# Patient Record
Sex: Male | Born: 1980
Health system: Southern US, Community
[De-identification: ages and names within clinical notes are randomized; demographics above are authoritative.]

## PROBLEM LIST (undated history)

## (undated) DIAGNOSIS — R7303 Prediabetes: Secondary | ICD-10-CM

## (undated) DIAGNOSIS — G4733 Obstructive sleep apnea (adult) (pediatric): Secondary | ICD-10-CM

## (undated) DIAGNOSIS — E785 Hyperlipidemia, unspecified: Secondary | ICD-10-CM

## (undated) DIAGNOSIS — I509 Heart failure, unspecified: Secondary | ICD-10-CM

## (undated) DIAGNOSIS — R600 Localized edema: Secondary | ICD-10-CM

## (undated) DIAGNOSIS — M1990 Unspecified osteoarthritis, unspecified site: Secondary | ICD-10-CM

## (undated) DIAGNOSIS — I10 Essential (primary) hypertension: Secondary | ICD-10-CM

## (undated) DIAGNOSIS — M549 Dorsalgia, unspecified: Secondary | ICD-10-CM

## (undated) DIAGNOSIS — J45909 Unspecified asthma, uncomplicated: Secondary | ICD-10-CM

## (undated) DIAGNOSIS — K760 Fatty (change of) liver, not elsewhere classified: Secondary | ICD-10-CM

## (undated) HISTORY — DX: Morbid (severe) obesity due to excess calories: E66.01

## (undated) HISTORY — DX: Dorsalgia, unspecified: M54.9

## (undated) HISTORY — DX: Unspecified asthma, uncomplicated: J45.909

## (undated) HISTORY — PX: NO PAST SURGERIES: SHX2092

## (undated) HISTORY — DX: Prediabetes: R73.03

## (undated) HISTORY — DX: Heart failure, unspecified: I50.9

## (undated) HISTORY — DX: Fatty (change of) liver, not elsewhere classified: K76.0

## (undated) HISTORY — DX: Obstructive sleep apnea (adult) (pediatric): G47.33

## (undated) HISTORY — DX: Essential (primary) hypertension: I10

## (undated) HISTORY — DX: Localized edema: R60.0

---

## 2000-01-22 ENCOUNTER — Emergency Department (HOSPITAL_COMMUNITY): Admission: EM | Admit: 2000-01-22 | Discharge: 2000-01-22 | Payer: Self-pay | Admitting: Emergency Medicine

## 2000-01-22 ENCOUNTER — Encounter: Payer: Self-pay | Admitting: Emergency Medicine

## 2006-06-11 ENCOUNTER — Emergency Department (HOSPITAL_COMMUNITY): Admission: EM | Admit: 2006-06-11 | Discharge: 2006-06-11 | Payer: Self-pay | Admitting: Emergency Medicine

## 2006-10-04 DIAGNOSIS — I509 Heart failure, unspecified: Secondary | ICD-10-CM

## 2006-10-04 HISTORY — DX: Heart failure, unspecified: I50.9

## 2007-03-05 ENCOUNTER — Inpatient Hospital Stay (HOSPITAL_COMMUNITY): Admission: AD | Admit: 2007-03-05 | Discharge: 2007-03-09 | Payer: Self-pay | Admitting: Family Medicine

## 2007-03-05 ENCOUNTER — Ambulatory Visit: Payer: Self-pay | Admitting: Family Medicine

## 2007-03-07 ENCOUNTER — Encounter (INDEPENDENT_AMBULATORY_CARE_PROVIDER_SITE_OTHER): Payer: Self-pay | Admitting: Family Medicine

## 2007-03-07 DIAGNOSIS — N179 Acute kidney failure, unspecified: Secondary | ICD-10-CM | POA: Insufficient documentation

## 2007-03-17 ENCOUNTER — Encounter (INDEPENDENT_AMBULATORY_CARE_PROVIDER_SITE_OTHER): Payer: Self-pay | Admitting: *Deleted

## 2007-03-17 ENCOUNTER — Ambulatory Visit: Payer: Self-pay | Admitting: Family Medicine

## 2007-03-17 DIAGNOSIS — E785 Hyperlipidemia, unspecified: Secondary | ICD-10-CM | POA: Insufficient documentation

## 2007-03-17 DIAGNOSIS — R404 Transient alteration of awareness: Secondary | ICD-10-CM | POA: Insufficient documentation

## 2007-03-17 DIAGNOSIS — I509 Heart failure, unspecified: Secondary | ICD-10-CM | POA: Insufficient documentation

## 2007-03-17 DIAGNOSIS — I110 Hypertensive heart disease with heart failure: Secondary | ICD-10-CM | POA: Insufficient documentation

## 2007-03-19 LAB — CONVERTED CEMR LAB
ALT: 22 units/L (ref 0–53)
AST: 15 units/L (ref 0–37)
Albumin: 4.1 g/dL (ref 3.5–5.2)
Alkaline Phosphatase: 39 units/L (ref 39–117)
Glucose, Bld: 94 mg/dL (ref 70–99)
Potassium: 4.6 meq/L (ref 3.5–5.3)
Sodium: 139 meq/L (ref 135–145)
Total Protein: 7 g/dL (ref 6.0–8.3)

## 2007-03-20 ENCOUNTER — Telehealth: Payer: Self-pay | Admitting: *Deleted

## 2007-03-29 ENCOUNTER — Encounter (INDEPENDENT_AMBULATORY_CARE_PROVIDER_SITE_OTHER): Payer: Self-pay | Admitting: Family Medicine

## 2007-04-27 ENCOUNTER — Ambulatory Visit (HOSPITAL_BASED_OUTPATIENT_CLINIC_OR_DEPARTMENT_OTHER): Admission: RE | Admit: 2007-04-27 | Discharge: 2007-04-27 | Payer: Self-pay | Admitting: Sports Medicine

## 2007-04-30 ENCOUNTER — Ambulatory Visit: Payer: Self-pay | Admitting: Internal Medicine

## 2007-05-02 ENCOUNTER — Ambulatory Visit: Payer: Self-pay | Admitting: Family Medicine

## 2007-05-02 DIAGNOSIS — I10 Essential (primary) hypertension: Secondary | ICD-10-CM | POA: Insufficient documentation

## 2007-05-31 ENCOUNTER — Encounter (INDEPENDENT_AMBULATORY_CARE_PROVIDER_SITE_OTHER): Payer: Self-pay | Admitting: Family Medicine

## 2007-05-31 DIAGNOSIS — G4733 Obstructive sleep apnea (adult) (pediatric): Secondary | ICD-10-CM | POA: Insufficient documentation

## 2007-06-12 ENCOUNTER — Encounter (INDEPENDENT_AMBULATORY_CARE_PROVIDER_SITE_OTHER): Payer: Self-pay | Admitting: Family Medicine

## 2007-07-04 ENCOUNTER — Encounter (INDEPENDENT_AMBULATORY_CARE_PROVIDER_SITE_OTHER): Payer: Self-pay | Admitting: Family Medicine

## 2007-07-04 ENCOUNTER — Ambulatory Visit: Payer: Self-pay | Admitting: Family Medicine

## 2007-07-05 ENCOUNTER — Telehealth (INDEPENDENT_AMBULATORY_CARE_PROVIDER_SITE_OTHER): Payer: Self-pay | Admitting: Family Medicine

## 2007-07-06 ENCOUNTER — Encounter (INDEPENDENT_AMBULATORY_CARE_PROVIDER_SITE_OTHER): Payer: Self-pay | Admitting: Family Medicine

## 2007-07-06 LAB — CONVERTED CEMR LAB
BUN: 12 mg/dL (ref 6–23)
Calcium: 9.5 mg/dL (ref 8.4–10.5)
Cholesterol: 163 mg/dL (ref 0–200)
Creatinine, Ser: 1.56 mg/dL — ABNORMAL HIGH (ref 0.40–1.50)
Potassium: 4.5 meq/L (ref 3.5–5.3)
Triglycerides: 136 mg/dL (ref <150)

## 2007-07-19 ENCOUNTER — Ambulatory Visit (HOSPITAL_BASED_OUTPATIENT_CLINIC_OR_DEPARTMENT_OTHER): Admission: RE | Admit: 2007-07-19 | Discharge: 2007-07-19 | Payer: Self-pay | Admitting: Family Medicine

## 2007-08-28 ENCOUNTER — Encounter (INDEPENDENT_AMBULATORY_CARE_PROVIDER_SITE_OTHER): Payer: Self-pay | Admitting: Family Medicine

## 2007-12-11 ENCOUNTER — Encounter (INDEPENDENT_AMBULATORY_CARE_PROVIDER_SITE_OTHER): Payer: Self-pay | Admitting: Family Medicine

## 2007-12-11 ENCOUNTER — Ambulatory Visit: Payer: Self-pay | Admitting: Sports Medicine

## 2008-05-29 ENCOUNTER — Encounter (INDEPENDENT_AMBULATORY_CARE_PROVIDER_SITE_OTHER): Payer: Self-pay | Admitting: Family Medicine

## 2008-11-25 ENCOUNTER — Encounter (INDEPENDENT_AMBULATORY_CARE_PROVIDER_SITE_OTHER): Payer: Self-pay | Admitting: Family Medicine

## 2008-11-25 ENCOUNTER — Ambulatory Visit: Payer: Self-pay | Admitting: Family Medicine

## 2008-11-25 DIAGNOSIS — L919 Hypertrophic disorder of the skin, unspecified: Secondary | ICD-10-CM

## 2008-11-25 DIAGNOSIS — L909 Atrophic disorder of skin, unspecified: Secondary | ICD-10-CM | POA: Insufficient documentation

## 2008-11-25 LAB — CONVERTED CEMR LAB
ALT: 24 units/L (ref 0–53)
Albumin: 4.3 g/dL (ref 3.5–5.2)
CO2: 20 meq/L (ref 19–32)
Chloride: 104 meq/L (ref 96–112)
Cholesterol: 187 mg/dL (ref 0–200)
Glucose, Bld: 87 mg/dL (ref 70–99)
LDL Cholesterol: 120 mg/dL — ABNORMAL HIGH (ref 0–99)
Potassium: 4.6 meq/L (ref 3.5–5.3)
Sodium: 143 meq/L (ref 135–145)
Total Protein: 7.5 g/dL (ref 6.0–8.3)
Triglycerides: 166 mg/dL — ABNORMAL HIGH (ref <150)

## 2008-11-28 ENCOUNTER — Encounter (INDEPENDENT_AMBULATORY_CARE_PROVIDER_SITE_OTHER): Payer: Self-pay | Admitting: Family Medicine

## 2010-11-16 ENCOUNTER — Encounter: Payer: Self-pay | Admitting: Family Medicine

## 2010-11-16 ENCOUNTER — Ambulatory Visit (INDEPENDENT_AMBULATORY_CARE_PROVIDER_SITE_OTHER): Payer: Managed Care, Other (non HMO) | Admitting: Family Medicine

## 2010-11-16 VITALS — BP 132/98 | HR 89 | Temp 98.4°F | Ht 73.0 in | Wt >= 6400 oz

## 2010-11-16 DIAGNOSIS — E785 Hyperlipidemia, unspecified: Secondary | ICD-10-CM

## 2010-11-16 DIAGNOSIS — M79609 Pain in unspecified limb: Secondary | ICD-10-CM

## 2010-11-16 DIAGNOSIS — I10 Essential (primary) hypertension: Secondary | ICD-10-CM

## 2010-11-16 DIAGNOSIS — E669 Obesity, unspecified: Secondary | ICD-10-CM

## 2010-11-16 DIAGNOSIS — M79675 Pain in left toe(s): Secondary | ICD-10-CM | POA: Insufficient documentation

## 2010-11-16 DIAGNOSIS — G4733 Obstructive sleep apnea (adult) (pediatric): Secondary | ICD-10-CM

## 2010-11-16 MED ORDER — PRAVASTATIN SODIUM 40 MG PO TABS: 40.0000 mg | ORAL_TABLET | Freq: Every evening | ORAL | Status: DC

## 2010-11-16 NOTE — Assessment & Plan Note (Signed)
Encouraged patient to wear CPAP nightly. Advised him to contact [facility] for other mask fitting/options that may be more comfortable. Discussed implications on BP, CHF, improving fatigue.

## 2010-11-16 NOTE — Assessment & Plan Note (Addendum)
Continued to have weight gain of 20 lbs over past year. Severe morbid obesity with BMI >50. Encouraged patient to continue working on lifestyle modifications. Will refer for bariatric evaluation. Will check A1C given risk factors for DM.

## 2010-11-16 NOTE — Assessment & Plan Note (Signed)
Patient to schedule office visit for toenail removal. Advised to soak in Epsom salt and attempt to manually place barrier device (cotton swab) to avoid worsening of problem. Also to cut nails straight across.

## 2010-11-16 NOTE — Patient Instructions (Addendum)
Nice to meet you. I will call you if your labs are not normal. Your medicine is at [facility]. I will make a referral for bariatric surgery. They should contact you. Please call [facility] for more suitable CPAP mask. This is important for your heart and blood pressure! Please schedule appointment for toenail removal in the near future and then for a check-up in 6 months.

## 2010-11-16 NOTE — Assessment & Plan Note (Signed)
Will check d-LDL and prescribe generic statin until patient can afford the more potent brand drug prescribed by Dr. Sharyn Lull.

## 2010-11-16 NOTE — Progress Notes (Signed)
  Subjective:    Patient ID: Leonard Lewis, male    DOB: 29-Mar-1981, 30 y.o.   MRN: [id number]  Toe Pain   Located at tip of left great toe originating at edge of toenail, present and worsening for ~68month. No inciting trauma. Possibly irritated with shoe wear.  No bleeding or oozing or skin breakdown. Pain with touching it. No difficulty walking, no hx of foot ulcer.   Obesity: Has gained ~25-30 lbs in past one year. Is at his heaviest currently. Once he lost about 20 lbs when his job was more physically demanding Conservator, [employer]). Now does not exercise regularly. When asked about dieting, he changes the subject. Has a friend who has found success with gastric banding, wants to explore this option.   Hypertension: Managed by Dr. Sharyn Lull (Cards), last visit was one month ago. Did not make any changes to meds, BP was 143/100 at that office visit per patient. Does not regularly check at home.  HLD:  Prescribed lipitor at cardiology visit. Patient cannot afford this medication, therefore not taking it. Last FLP in our clinic records shows LDL 120 >2 years ago. Patient thinks he has taken statin in the past, cannot remember name or why it was stopped.   OSA: Has not been using CPAP machine because mask is uncomfortable. Questions who to contact. [facility] has been taking care of his equipment in the past. Does feel fatigued chronically during the day time.     Review of Systems Endorses fatigue, polyuria, polydipsia. Denies fevers, chills, CP.      Objective:   Physical Exam  Constitutional: He is oriented to person, place, and time. He appears well-developed and well-nourished.  HENT:  Head: Normocephalic and atraumatic.  Eyes: EOM are normal. Pupils are equal, round, and reactive to light.  Cardiovascular: Normal rate.        Distant heart sounds due to body habitus.  Pulmonary/Chest: Effort normal.  Musculoskeletal: He exhibits no edema and no tenderness.  Neurological: He  is alert and oriented to person, place, and time.  Skin: Skin is warm.   Left great toe has some skin thickening/darkening and is TTP over distal aspect. Medial ingrowing of toenail into the skin fold. No oozing/bleeding or open skin wounds.        Assessment & Plan:

## 2010-11-16 NOTE — Assessment & Plan Note (Signed)
Improved control by manual cuff testing. Diastolic still above goal, but essentially this patient is maxed out on antihypertensives. Lifestyle, weight management, OSA treatment will be areas of improvement.

## 2010-11-17 ENCOUNTER — Telehealth: Payer: Self-pay | Admitting: *Deleted

## 2010-11-17 LAB — COMPREHENSIVE METABOLIC PANEL
ALT: 22 U/L (ref 0–53)
AST: 17 U/L (ref 0–37)
Albumin: 4.4 g/dL (ref 3.5–5.2)
CO2: 27 mEq/L (ref 19–32)
Calcium: 9.8 mg/dL (ref 8.4–10.5)
Chloride: 102 mEq/L (ref 96–112)
Sodium: 137 mEq/L (ref 135–145)
Total Bilirubin: 0.4 mg/dL (ref 0.3–1.2)
Total Protein: 7.8 g/dL (ref 6.0–8.3)

## 2010-11-17 LAB — LDL CHOLESTEROL, DIRECT: Direct LDL: 107 mg/dL — ABNORMAL HIGH

## 2010-11-17 NOTE — Telephone Encounter (Signed)
Pt will have to call or go online ([facility] website) to initiate the start of the bariatric procedures.

## 2010-11-17 NOTE — Progress Notes (Signed)
Addended by: Lloyd Huger on: 11/17/2010 12:43 PM   Modules accepted: Orders

## 2010-11-17 NOTE — Telephone Encounter (Signed)
Spoke to Leonard Lewis and gave him the information regarding the bariatric clinic @ [facility].Loralee Pacas [place]

## 2010-11-18 LAB — TSH: TSH: 1.31 u[IU]/mL (ref 0.350–4.500)

## 2010-11-19 ENCOUNTER — Encounter: Payer: Self-pay | Admitting: Sports Medicine

## 2010-11-19 ENCOUNTER — Ambulatory Visit (INDEPENDENT_AMBULATORY_CARE_PROVIDER_SITE_OTHER): Payer: Managed Care, Other (non HMO) | Admitting: Sports Medicine

## 2010-11-19 VITALS — BP 171/108 | HR 71 | Temp 98.7°F | Ht 71.0 in | Wt >= 6400 oz

## 2010-11-19 DIAGNOSIS — M79675 Pain in left toe(s): Secondary | ICD-10-CM

## 2010-11-19 DIAGNOSIS — M79609 Pain in unspecified limb: Secondary | ICD-10-CM

## 2010-11-19 MED ORDER — HYDROCODONE-ACETAMINOPHEN 5-500 MG PO TABS: 1.0000 | ORAL_TABLET | ORAL | Status: DC | PRN

## 2010-11-19 MED ORDER — CEPHALEXIN 500 MG PO CAPS: 500.0000 mg | ORAL_CAPSULE | Freq: Four times a day (QID) | ORAL | Status: AC

## 2010-11-19 NOTE — Patient Instructions (Addendum)
We removed your toenail. See attached handout for home care instructions. Vicodin for pain. Short course of antibiotics.  Come back to see [place] as needed.  -Dr. Karie Schwalbe.

## 2010-11-19 NOTE — Assessment & Plan Note (Addendum)
See procedure note. Home care handout given. Short course keflex. Vicodin. RTC prn.

## 2010-11-19 NOTE — Progress Notes (Signed)
  Subjective:    Patient ID: Leonard Lewis, male    DOB: [birth year], 30 y.o.   MRN: [id number]  HPI Seen a couple of days ago by Dr. Cristal Ford, dx ingrown toenail.  Advised RTC to have this removed.  Has been soaking in epsom salts.  Pain stable.  Works on [employer], on his feet a lot and bending a lot.  Toe is exquisitely painful.  No fevers/chills/toe drainage.   Review of Systems    See HPI Objective:   Physical Exam  Constitutional: He appears well-developed and well-nourished. No distress.  Musculoskeletal:       L great toe with ingrown medial half of nail with early paronychia.  This is very tender but no drainage.  No erythema, no induration.  Cap refill <2 sec.  Skin: He is not diaphoretic.      Procedure:  Removal of Left Great toenail, medial aspect. Risks, benefits, alternatives explained to patient. Consent obtained. Toe cleaned with alcohol, then a total of 10cc lidocaine 1% infiltrated at adjacent webspaces at the location of the bifurcation of the common digital nerve to proper digital nerves.  Some lidocaine also infiltrated at hyponychium and under nail bed.  Adequate anesthesia ensured. Toe prepped with betadine and draped in a sterile fashion. Nail elevator used to separate nail plate from nail bed. Clippers used to cut toenail in a longitudinal fashion to proximal nail fold and matrix. Hemostat then used to separate nail fragment from surrounding structures. Minor bleeding controlled with pressure and silver nitrate. Antibiotic ointment applied. Toe dressed. Aftercare advised.      Assessment & Plan:

## 2010-11-30 ENCOUNTER — Telehealth: Payer: Self-pay | Admitting: Family Medicine

## 2010-11-30 NOTE — Telephone Encounter (Signed)
Can you ask specifically what needs to be changed so that I can draft another letter?  Also how is his toe doing?

## 2010-11-30 NOTE — Telephone Encounter (Signed)
Spoke with patient and he needs a letter saying exactly when he can go back to work. And when he can what are the restrictions he will be under and for how long.  I asked about his toe and he states that it is a lot better -[place]

## 2010-11-30 NOTE — Telephone Encounter (Signed)
Has a problem with work release and needs to talk to Dr Karie Schwalbe

## 2010-12-02 NOTE — Telephone Encounter (Signed)
Discussed details with pt. Toe is much better, would like a note allowing him to go back to work Monday. Will leave note at front.

## 2010-12-11 ENCOUNTER — Ambulatory Visit (INDEPENDENT_AMBULATORY_CARE_PROVIDER_SITE_OTHER): Payer: Managed Care, Other (non HMO) | Admitting: Sports Medicine

## 2010-12-11 ENCOUNTER — Encounter: Payer: Self-pay | Admitting: Sports Medicine

## 2010-12-11 DIAGNOSIS — M79675 Pain in left toe(s): Secondary | ICD-10-CM

## 2010-12-11 DIAGNOSIS — M79609 Pain in unspecified limb: Secondary | ICD-10-CM

## 2010-12-11 DIAGNOSIS — I10 Essential (primary) hypertension: Secondary | ICD-10-CM

## 2010-12-11 NOTE — Patient Instructions (Signed)
Great to see you,  Made another few modifications on your toenail. You should be good to go now. Soak in epsom salts as often as you can, every 2h if able. Come back to see me to discuss blood pressure within the next few weeks. (make appt at the front)  -Dr. Karie Schwalbe.

## 2010-12-11 NOTE — Assessment & Plan Note (Signed)
Further modification of toenail performed as above in procedure note. RTC prn. Full duty at work.

## 2010-12-11 NOTE — Progress Notes (Signed)
  Subjective:    Patient ID: Leonard Lewis, male    DOB: Oct 07, 1980, 30 y.o.   MRN: [id number]  HPI Here to f/u toenail removal:  Pain gone, able to work.  Finished abx.  Now c/o some pain on lateral aspect L great toe.  No drainage.  Able to go back to work as before.  HTN:  High but didn't take meds today.   Review of Systems    See HPI Objective:   Physical Exam    Noted Medial aspect L great toenail well healed. Lateral aspect with some TTP, no drainage, no purulence.  Noted nail is dystrophic and jutting into hyponychium.  Procedure:  Removal of dystrophic lateral part of L great  toenail. Risks, benefits, alternatives explained to patient. Consent obtained. Toe cleaned with alcohol, then a total of 5cc lidocaine 1% infiltrated at adjacent webspaces at the location of the bifurcation of the common digital nerve to proper digital nerves.  Some lidocaine also infiltrated at hyponychium and under nail bed.  Adequate anesthesia ensured. Toe prepped with alcohol. Clippers used to cut dystrophic part of toenail. Toe dressed. EBL 1cc. Aftercare advised.      Assessment & Plan:

## 2010-12-11 NOTE — Assessment & Plan Note (Signed)
Pt will RTC to discuss this. 

## 2010-12-29 ENCOUNTER — Encounter: Payer: Self-pay | Admitting: Sports Medicine

## 2010-12-29 ENCOUNTER — Ambulatory Visit (INDEPENDENT_AMBULATORY_CARE_PROVIDER_SITE_OTHER): Payer: Managed Care, Other (non HMO) | Admitting: Sports Medicine

## 2010-12-29 DIAGNOSIS — E785 Hyperlipidemia, unspecified: Secondary | ICD-10-CM

## 2010-12-29 DIAGNOSIS — E669 Obesity, unspecified: Secondary | ICD-10-CM

## 2010-12-29 DIAGNOSIS — I10 Essential (primary) hypertension: Secondary | ICD-10-CM

## 2010-12-29 MED ORDER — CARVEDILOL PHOSPHATE ER 80 MG PO CP24
80.0000 mg | ORAL_CAPSULE | Freq: Every day | ORAL | Status: DC
Start: 2010-12-29 — End: 2013-02-05

## 2010-12-29 MED ORDER — AMLODIPINE BESYLATE 10 MG PO TABS: 10.0000 mg | ORAL_TABLET | Freq: Every day | ORAL | Status: DC

## 2010-12-29 MED ORDER — LISINOPRIL-HYDROCHLOROTHIAZIDE 20-25 MG PO TABS: 1.0000 | ORAL_TABLET | Freq: Every day | ORAL | Status: DC

## 2010-12-29 NOTE — Patient Instructions (Addendum)
Great to see you today! Refilling all BP medications. See below for changes in medications. Referral to nutrition. DASH diet. Come back to the office for a nurse visit to recheck BP in 1-2 weeks.  You do not have to see me then. Come back to see me in 2 months to recheck everything.  Leonard Lewis. Benjamin Stain, M.D.

## 2010-12-29 NOTE — Assessment & Plan Note (Addendum)
Refilled all meds, changed lisinopril and HCTZ to combo pill. DASH diet handout given.

## 2010-12-29 NOTE — Assessment & Plan Note (Signed)
Cont pravastatin.

## 2010-12-29 NOTE — Progress Notes (Signed)
  Subjective:    Patient ID: Leonard Lewis, male    DOB: 1981-05-22, 30 y.o.   MRN: [id number]  HPI HTN:  Pt out of all meds, needs refills on everything.  No HA, no CP, no SOB, no LE swelling.  HLD:  Last dLDL controlled, no changes.  Prediabetes:  A1c was 6.4%.  Ingrown toenail:  Resolved s/p removal.   Review of Systems    See HPI Objective:   Physical Exam  Constitutional: He appears well-developed and well-nourished.  Cardiovascular: Normal rate, normal heart sounds and intact distal pulses.  Exam reveals no gallop and no friction rub.   No murmur heard. Pulmonary/Chest: Effort normal. No respiratory distress. He has no wheezes. He has no rales. He exhibits no tenderness.  Abdominal: Soft. He exhibits no distension and no mass. There is no tenderness. There is no rebound and no guarding.  Skin: Skin is warm and dry.          Assessment & Plan:

## 2010-12-29 NOTE — Assessment & Plan Note (Signed)
Medical nutrition referral.

## 2011-01-14 ENCOUNTER — Encounter: Payer: Managed Care, Other (non HMO) | Attending: Sports Medicine | Admitting: *Deleted

## 2011-01-14 DIAGNOSIS — E669 Obesity, unspecified: Secondary | ICD-10-CM | POA: Insufficient documentation

## 2011-01-14 DIAGNOSIS — Z713 Dietary counseling and surveillance: Secondary | ICD-10-CM | POA: Insufficient documentation

## 2011-01-28 ENCOUNTER — Encounter: Payer: Managed Care, Other (non HMO) | Admitting: *Deleted

## 2011-02-11 ENCOUNTER — Encounter: Payer: Managed Care, Other (non HMO) | Attending: Sports Medicine | Admitting: *Deleted

## 2011-02-11 DIAGNOSIS — E669 Obesity, unspecified: Secondary | ICD-10-CM | POA: Insufficient documentation

## 2011-02-11 DIAGNOSIS — Z713 Dietary counseling and surveillance: Secondary | ICD-10-CM | POA: Insufficient documentation

## 2011-02-16 NOTE — Procedures (Signed)
NAME:  Leonard Lewis, Leonard Lewis               ACCOUNT NO.:  [id number]   MEDICAL RECORD NO.:  [id number]          PATIENT TYPE:  OUT   LOCATION:  SLEEP CENTER                 FACILITY:  [facility]   PHYSICIAN:  Clinton D. Maple Hudson, MD, FCCP, FACPDATE OF BIRTH:  09-02-1980   DATE OF STUDY:  07/14/2007                            NOCTURNAL POLYSOMNOGRAM   REFERRING PHYSICIAN:  Johney Maine, M.D.   INDICATION FOR STUDY:  Hypersomnia with sleep apnea.   EPWORTH SLEEPINESS SCORE:  8/24  BMI 45, weight 340 pounds.   MEDICATIONS:  Listed and reviewed.  A baseline diagnostic study on April 27, 2007, recorded an AHI of 93.9 per hour.  CPAP titration is  requested.   SLEEP ARCHITECTURE:  Total sleep time 312 minutes with sleep efficiency  81%.  Stage 1 was 8%, stage 2 60%, stage 3 13%, REM 19% of total sleep  time.  Sleep latency 18 minutes, REM latency 128 minutes awake after  sleep onset, 56 minutes arousable index 9.4.  [place] was taken at 10:40  p.m.   RESPIRATORY DATA:  CPAP titration protocol.  CPAP was titrated to 16  CWP, AHI 0 per hour.  A medium Quattro mask was used with heated  humidifier.   OXYGEN DATA:  Snoring was prevented at final CPAP pressures with oxygen  saturation maintained at 98% on room air.   CARDIAC DATA:  Normal sinus rhythm.   MOVEMENT-PARASOMNIA:  No significant movement disturbance.  Bathroom x1.   IMPRESSIONS-RECOMMENDATIONS:  1. Successful CPAP titration to 16 CWP, AHI 0 per hour.  A medium      Quattro full-face mask was used with heated humidifier.  2. Baseline diagnostic NPSG on April 27, 2007, had recorded an AHI of      93.9 per hour.      Clinton D. Maple Hudson, MD, [facility], FACP  Diplomate, Biomedical engineer of Sleep Medicine  Electronically Signed    CDY/MEDQ  D:  07/16/2007 11:36:30  T:  07/16/2007 13:12:30  Job:  [id number]

## 2011-02-16 NOTE — H&P (Signed)
NAME:  Leonard Lewis, Leonard Lewis               ACCOUNT NO.:  [id number]   MEDICAL RECORD NO.:  [id number]          PATIENT TYPE:  OBV   LOCATION:  6740                         FACILITY:  [facility]   PHYSICIAN:  Altamese Cabal, M.D.  DATE OF BIRTH:  [birth year]   DATE OF ADMISSION:  03/04/2007  DATE OF DISCHARGE:                              HISTORY & PHYSICAL   CHIEF COMPLAINT:  Transfer from [place] for severe hypertension and  shortness of breath, PCP unassigned.   HISTORY OF PRESENT ILLNESS:  Leonard Lewis is a 30 year old African American male  with a history of untreated hypertension, who initially presented to the  [facility] this morning with shortness of breath.  The patient  states that he saw a physician last October for the first time in many  years and was evaluated for headaches and fatigue, and found to be  hypertensive in the 200s.  He was started on an unknown blood pressure  medicine but did not follow up.  The patient stopped the medicine in  February, which was about 4 months ago.  He said he had generally felt  well up until a few days ago when he began having difficulty sleeping  secondary to shortness of breath.  The patient also has had increasing  dyspnea on exertion recently.  He can typically walk about one mile  without difficulty, but now he is short of breath after walking only a  few blocks.  The patient denies lower extremity pain or swelling.  He  also denies chest pain, headaches and visual problems.  He denies  weakness and numbness in his extremities.  He does endorse a 2 to 3-  pillow orthopnea but no PND.  He has had not had any recent infections.   REVIEW OF SYSTEMS:  Positive for a 40-pound weight gain over the last  year.   PAST MEDICAL HISTORY:  Significant for hypertension, diagnosed in  October 2007.   MEDICATIONS:  None.   ALLERGIES:  NO KNOWN DRUG ALLERGIES.   FAMILY HISTORY:  His mother is alive and has hypertension.  His uncle is  alive and has  hypertension.  No heart disease, asthma, cancers, diabetes  or high cholesterol in the family.   SOCIAL HISTORY:  The patient lives with his grandmother.  He is single.  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  He smoked briefly for 3 months but  quit 2 months ago.  Denies alcohol and illicit drugs.  He does eat fast  food three to four times a week.   PHYSICAL EXAMINATION:  VITAL SIGNS:  From [place] include a temperature  of 97.3, pulse 113, respirations 40, blood pressure 220/?, height 71-1/2  inches, weight 343 pounds.  GENERAL:  Alert and oriented, very pleasant male in no acute distress  who is obese.  HEENT:  Head is normocephalic, atraumatic.  Pupils equal, round and  reactive to light and accommodation.  Moist mucous membranes.  NECK:  Supple.  No thyromegaly, no JVD.  No bruits.  CARDIOVASCULAR:  Mild tachycardia but is regular.  He has an S3 gallop  but no  murmurs.  LUNGS:  He is mildly tachypneic though without wheezes or crackles.  ABDOMEN:  Soft, positive bowel sounds, nontender, nondistended.  No  organomegaly.  EXTREMITIES:  No clubbing, cyanosis or edema.  No calf asymmetry.  Homan's is negative.  No palpable cords.  NEUROLOGIC EXAM:  Cranial nerves II-XII are grossly intact.  Strength  5/5 throughout.  Reflexes 1+, normal sensation throughout.   LABORATORIES AND STUDIES:  So far he has an EKG that shows sinus tach  biatrial enlargement and LVH, no ST or T changes.  His chest x-ray shows  cardiomegaly and mild pulmonary edema.   ASSESSMENT/PLAN:  This is an 30 year old male with hypertensive urgency,  possibly congestive heart failure.  1. Hypertensive urgency.  This is most likely from longstanding      untreated primary hypertension.  He has questionable end-organ      damage with pulmonary edema.  We will attempt to lower his blood      pressure with IV Lasix to about 180 systolic over the last next few      hours.  He will then need gradual addition and titration  of oral      medications.  He does not have any neurologic abnormalities, so I      do not feel it is necessary to get a CT of his head.  There is no      evidence of acute MI on the EKG, but there is left ventricular      hypertrophy present which suggests longstanding hypertension.  I      will check cardiac enzymes q.8 h x3, I will check a UDS and UA as      well as a CMP to assess his renal function and look for proteinuria      or abnormal creatinine.  2. Shortness of breath.  The differential includes congestive heart      failure versus reactive airway disease versus acute coronary      syndrome.  I doubt there is a pulmonary embolism given no risk      factors and the more likely diagnosis of congestive heart failure.      We will observe for a response of his dyspnea to the Lasix.  I will      check a BMP and a 2-D echo.  He may also have a component of      deconditioning.  He will need monitoring, strict intake and      outputs, and daily weights.  3. Tachycardia.  This is sinus tach on the EKG.  We will monitor on      telemetry.  He may benefit from a beta blocker started at low dose.      Check a TSH, check urine and urine catecholamines for      pheochromocytoma, though this is not likely.  However, the patient      is young and has hypertension and tachycardia.  4. Questionable obstructive sleep apnea.  We will monitor his O2 sats      overnight.  This may contribute to the hypertension.  A sleep study      might be warranted as well as weight loss.  5. Obesity.  I discussed this with the patient, and the fact that he      needs to try to lose weight.  He is agreeable with a nutrition      consult for assistance with this and to discuss the DASH diet.  6.  Prophylaxis.  We will give him prophylactic Lovenox per pharmacy.  7. Fluids, electrolytes and nutrition.  We will saline lock his IV and      give him a heart healthy diet.     Altamese Cabal, M.D.     KS/MEDQ   D:  03/04/2007  T:  03/04/2007  Job:  [id number]

## 2011-02-16 NOTE — Discharge Summary (Signed)
NAME:  Leonard Lewis, Leonard Lewis               ACCOUNT NO.:  [id number]   MEDICAL RECORD NO.:  [id number]          PATIENT TYPE:  INP   LOCATION:  2001                         FACILITY:  [facility]   PHYSICIAN:  Zenaida Deed. Mayford Knife, M.D.DATE OF BIRTH:  [birth year]   DATE OF ADMISSION:  03/04/2007  DATE OF DISCHARGE:  03/09/2007                               DISCHARGE SUMMARY   ADMITTING DIAGNOSES:  1. Hypertensive emergency/urgency.  2. Obesity  3. Tachycardia.  4. Questionable obstructive sleep apnea.   DISCHARGE DIAGNOSES:  1. Hypertension.  2. Heart failure  3. Obesity.  4. Sleep apnea.  5. Hyperlipidemia   DISCHARGE MEDICATIONS:  1. Amlodipine 10 mg 1 tablet p.o. daily.  2. Lisinopril 5 mg 1 tablet p.o. daily.  Please note Dr. Sharyn Lull may      increase this dose if his kidneys remain okay.  3. Simcor 20/500, 1 tablet p.o. at bedtime.  4. Aspirin 81 mg 1 tablet p.o. daily.  5. Coreg 25 mg 1 tablet p.o. daily.   DISCHARGE FOLLOWUP:  1. The patient is going to follow up with Dr. Irving Burton, who works      with Dr. Karn Pickler, at [facility] on March 17, 2007 at      2:15 p.m., and he would then follow up with Dr. Karn Pickler.  2. The patient will follow up with Dr. Sharyn Lull, cardiology, on March 13, 2007, also at 2:15 p.m.  Dr. Annitta Jersey office states they will      check at [facility] at that time to evaluate kidney response to the ACE      inhibitor.   HOSPITAL COURSE:  This is a 30 year old gentleman who was admitted for  shortness of breath and was found to have significantly elevated blood  pressures on admission, which we called hypertensive emergency due to  his shortness of breath.  His systolics were noted to be in the 200s.  Throughout his hospital course, we continued to adjust his hypertension  medicines, and on the day of discharge his systolic was in the 150s,  although his diastolic remained in the 90s-100s.  Please see the  following for details.  1. Hypertensive  emergency/urgency.  The patient was initially started      on IV Lopressor scheduled and then p.r.n. IV Lopressor.  He also      was started on Lasix to also help lower his pressure.  His      pressures gradually came down with the IV labetalol.  On March 05, 2007, the patient was switched over to p.o. medications.      Initially, his blood pressures remained elevated when he was only      on the HCTZ and p.o. beta blocker.  However, we added on an ACE      inhibitor and beta blocker, and his pressures eventually came down.      On the day of discharge, his  systolics were in the 150s, although      his diastolics remained elevated in the 100s.  We consulted  cardiology for an abnormal echocardiogram, and they also help [place]      lower his blood pressure.  There was a bump in his creatinine with      the ACE inhibitor.  Therefore, we did low dose, and he will      continue be followed to check his renal function.  In regard to his      renal function, it was noted on ultrasound that he has medical      renal disease.  Therefore, his creatinine may be elevated at      baseline.  We did rule the patient out for acute myocardial      infarction.  Although his troponins were elevated at 0.17,      cardiology felt this was not due to ischemia and said it was due to      strain from his tachycardia.  Unfortunately, this gentleman has had      severely high blood pressure for most likely a very long time which      has resulted in both heart and kidney damage.  It is imperative      that we keep his blood pressures as low as we possibly can, and his      medications were titrated several times.  However, at day of      discharge, he was sent home on Coreg, a low-dose ACE inhibitor,      lisinopril, as well as Norvasc.  We feel this is the best regimen      at this time for this patient.  We did assess the patient for      pheochromocytoma, and his epinephrine less than 10, which is       normal.  His dopamine was less 30, which was normal.  Please note,      he did have an elevated norepinephrine level at 843.  However, this      can also be seen with hypertension.  If this becomes an issue, we      can further evaluate the patient for pheochromocytoma.  However,      this is unlikely.  2. Tachycardia.  The patient came in with tachycardia in the 110s-128s      which took a while to come down.  His TSH was normal.  The      patient's heart rate finally normalized on the higher doses of beta      blockers and hopefully will continue to be normal on the Coreg.  3. Heart failure.  This is not an admitting diagnosis, and the patient      was found to have heart failure on first a 2D echocardiogram and      then followed by a Myoview and stress test.  The patient's EF      unfortunately is 30%.  He also has some left wall hypokinesis and a      possible inferior wall defect.  However, there were no ischemic      changes during his stress test.  This was performed by Dr. Sharyn Lull,      whom we consulted.  The patient medical management was maximized      with beta blockers as well as a calcium channel blocker and an ACE      inhibitor.  We also started the patient on aspirin to help      cardioprotectively.  The patient because of his low EF may in the  future benefit from spironolactone.  However, at this point, we      feel that the patient has already been bombarded with many      medications, and this is something his primary care Leonard Lewis can      follow in the future.  The patient left the hospital without any      symptoms or shortness of breath, although he did have significant      shortness of breath during his stress test.  The patient needs to      be followed closely to control the blood pressures to help with his      heart failure.  Thank you, Dr. Sharyn Lull, for the consult. 4. Obesity.  The patient is markedly obese.  He was counseled on      weight loss and  actually had a nutrition consult during this      hospital stay.  We will continue to follow this in the outpatient      setting.  5. Sleep apnea.  The patient endorses some signs and symptoms of sleep      apnea.  He can be worked up as an outpatient.  Weight loss will      help his sleep apnea.  6. Questionable renal failure.  It was noted that the patient had an      elevated creatinine of 1.63 on admission.  It initially went down,      however bumped to 1.9 after starting an ACE inhibitor, but by day      of discharge was stable at 1.62.  This quite possibly is the      patient's baseline.  However, we need to consider whether this ACE      inhibitor will be a factor or not.  This will be monitored in the      outpatient setting.  If indeed his ACE inhibitor is causing this      bump in creatinine, we will have to discontinue it and consider      renal artery stenosis as a cause and then switch the patient over      to an Imdur or hydralazine-type medication.  7. Hyperlipidemia.  The patient was noted to have significant      hyperlipidemia during the hospital stay that included an LDL of      139, cholesterol 202, HDL at 29.  The patient was started Simcor,      which is a combination of niacin and a statin in order to both      raise his HDL and lower his LDL.  He tolerated this well and was      discharged on this medication.   IMAGES:  1. The patient had a chest x-ray which showed no acute disease and a      mildly enlarged heart.  2. The patient had a renal ultrasound which showed mildly hyperechoic      kidneys that suggest medical renal disease.  3. The patient had a nuclear medicine myocardial perfusion stress test      with imaging which showed no ischemia and an EF of 30%, inferior      defect on the wall, global wall thickening, and global left      ventricular hypokinesis.  4. The patient also had a 2D echocardiogram which showed an EF of 40%-      50% and diffuse  left ventricular hypokinesis.   PERTINENT LABORATORIES:  Most of these have been  dictated.  However, the  following all other results.  Troponins:  Initial troponin was 0.17,  which decreased to 0.12 after increasing to 0.19.  Initial CBC showed an  elevated white count of 11, hemoglobin and hematocrit were normal at  13.1 and 41.1, MCV was low at 69.8, platelets normal at 322.  Comprehensive metabolic panel showed a low potassium of 3.3, sodium was  normal at 138, BUN 15, creatinine of 1.63.  By day of discharge, basic  metabolic panel was within normal limits, except for creatinine 1.62,  BUN of 16, potassium was normal.  Please note, the CBC at time of  discharge was within normal limits, except for a low MCV of 68.7,  hemoglobin of 12.6, and hematocrit of 38.9.  TSH was within normal  limits at 1.526.  Drug screen negative.  BNP elevated at 200. Fractionated catecholamines are as follows:  Urinary catecholamines are  elevated at 160.  This includes epinephrine plus norepinephrine,  epinephrine is normal at 8, norepinephrine elevated at 151, dopamine  normal at 369.  Serum catecholamines:  Epinephrine less than 10,  norepinephrine 843, high; dopamine less than 30, this is normal.  Lipid  profile as previously dictated.  Total cholesterol 202, triglycerides  168, HDL 29, LDL 139.  Urinalysis was within normal limits.   The patient was discharged in improved and stable condition.  However,  he will require close followup.   To the primary care Leonard Lewis, please follow-up on these issues.  The  patient had a low MCV, although normal hemoglobin.  Consider  pheochromocytoma if the patient's blood pressures continued to be  elevated in the setting of multiple antihypertensive medications, and  also further medications to help with this patient's heart failure.     ______________________________  Johney Maine, M.D.    ______________________________  Zenaida Deed. Mayford Knife, M.D.     JT/MEDQ  D:  03/09/2007  T:  03/10/2007  Job:  [id number]   cc:   Eduardo Osier. Sharyn Lull, M.D.

## 2011-02-16 NOTE — Procedures (Signed)
NAME:  Leonard Lewis, Leonard Lewis               ACCOUNT NO.:  [id number]   MEDICAL RECORD NO.:  [id number]          PATIENT TYPE:  OUT   LOCATION:  SLEEP CENTER                 FACILITY:  [facility]   PHYSICIAN:  Clinton D. Maple Hudson, MD, FCCP, FACPDATE OF BIRTH:  12-27-1979   DATE OF STUDY:  04/27/2007                            NOCTURNAL POLYSOMNOGRAM   REFERRING PHYSICIAN:  Angeline Slim, M.D.   INDICATION FOR STUDY:  Hypersomnia with sleep apnea.   EPWORTH SLEEPINESS SCORE:  12/24, BMI 43.6, weight 330 pounds.   HOME MEDICATIONS:  Listed and reviewed.  Diagnostic NPSG protocol was  requested.   SLEEP ARCHITECTURE:  Total sleep time 370 minutes with sleep efficiency  90%, stage 1 was 5%, stage 2 78%, stage 3 absent, REM 17% of total sleep  time, sleep latency 22 minutes, REM latency 47 minutes, awake after  sleep onset 18 minutes, arousal index 25.1 indicating increased sleep  fragmentation with EEG arousal.  SIMCOR was taken at bedtime.   RESPIRATORY DATA:  Diagnostic protocol.  Apnea/hypopnea index (AHI, RDI)  93.9 obstructive events per hour indicating severe obstructive sleep  apnea/hypopnea syndrome.  There were 3 obstructive apneas and 576  hypopneas.  Events were not positional.  REM/AHI 96 per hour.   OXYGEN DATA:  Moderate to occasionally severe snoring with oxygen  desaturation to a nadir of 76%.  Mean oxygen saturation through the  study was 92% on room air.   CARDIAC DATA:  Normal sinus rhythm.   MOVEMENT-PARASOMNIA:  No significant limb jerk or movement disturbance.   IMPRESSIONS-RECOMMENDATIONS:  1. Severe obstructive sleep apnea/hypopnea syndrome.  Apnea-hypopnea      index 93.9 per hour with nonpositional      events, moderate to loud snoring, and oxygen desaturation to a      nadir of 76%.  2. Consider return for CPAP titration or evaluate for alternative      therapies as appropriate.      Clinton D. Maple Hudson, MD, [facility], FACP  Diplomate, Biomedical engineer of Sleep  Medicine  Electronically Signed     CDY/MEDQ  D:  04/30/2007 12:20:38  T:  05/01/2007 08:46:00  Job:  [id number]   cc:   Angeline Slim, M.D.

## 2011-03-03 ENCOUNTER — Ambulatory Visit: Payer: Managed Care, Other (non HMO) | Admitting: *Deleted

## 2011-03-17 ENCOUNTER — Ambulatory Visit: Payer: Managed Care, Other (non HMO) | Admitting: *Deleted

## 2011-05-17 ENCOUNTER — Inpatient Hospital Stay (INDEPENDENT_AMBULATORY_CARE_PROVIDER_SITE_OTHER)
Admission: RE | Admit: 2011-05-17 | Discharge: 2011-05-17 | Disposition: A | Discharge status: Home / Self Care | Payer: Managed Care, Other (non HMO) | Source: Ambulatory Visit | Attending: Emergency Medicine | Admitting: Emergency Medicine

## 2011-05-17 ENCOUNTER — Ambulatory Visit (INDEPENDENT_AMBULATORY_CARE_PROVIDER_SITE_OTHER): Payer: Managed Care, Other (non HMO)

## 2011-05-17 ENCOUNTER — Ambulatory Visit (HOSPITAL_COMMUNITY): Payer: Managed Care, Other (non HMO)

## 2011-05-17 DIAGNOSIS — S92919A Unspecified fracture of unspecified toe(s), initial encounter for closed fracture: Secondary | ICD-10-CM

## 2011-07-22 LAB — BASIC METABOLIC PANEL
BUN: 16
BUN: 16
CO2: 26
CO2: 27
CO2: 29
Calcium: 9.3
Calcium: 9.7
Chloride: 102
Chloride: 99
Creatinine, Ser: 1.62 — ABNORMAL HIGH
Creatinine, Ser: 1.96 — ABNORMAL HIGH
GFR calc Af Amer: 50 — ABNORMAL LOW
GFR calc non Af Amer: 52 — ABNORMAL LOW
Glucose, Bld: 101 — ABNORMAL HIGH
Glucose, Bld: 101 — ABNORMAL HIGH
Glucose, Bld: 106 — ABNORMAL HIGH
Glucose, Bld: 91
Potassium: 3.3 — ABNORMAL LOW
Potassium: 4.1
Sodium: 136
Sodium: 138
Sodium: 139

## 2011-07-22 LAB — CBC
HCT: 38.9 — ABNORMAL LOW
Hemoglobin: 12.6 — ABNORMAL LOW
MCHC: 32.5
MCHC: 32.6
MCV: 68.7 — ABNORMAL LOW
Platelets: 289
RDW: 15.3 — ABNORMAL HIGH
RDW: 15.4 — ABNORMAL HIGH

## 2011-07-22 LAB — CARDIAC PANEL(CRET KIN+CKTOT+MB+TROPI)
CK, MB: 2.8
Relative Index: 0.4

## 2011-07-22 LAB — LIPID PANEL
Cholesterol: 202 — ABNORMAL HIGH
LDL Cholesterol: 139 — ABNORMAL HIGH

## 2011-07-22 LAB — CATECHOLAMINES, FRACTIONATED, URINE, 24 HOUR: Norepinephrine 24 Hr Urine: 151 ug/24hr — ABNORMAL HIGH (ref <80)

## 2011-07-22 LAB — CATECHOLAMINES, FRACTIONATED, PLASMA
Dopamine: <30 pg/mL (ref <60)
Epinephrine: <10 pg/mL (ref <84)

## 2011-10-25 ENCOUNTER — Emergency Department (HOSPITAL_COMMUNITY)
Admission: EM | Admit: 2011-10-25 | Discharge: 2011-10-25 | Disposition: A | Discharge status: Home / Self Care | Payer: Managed Care, Other (non HMO) | Attending: Emergency Medicine | Admitting: Emergency Medicine

## 2011-10-25 ENCOUNTER — Encounter (HOSPITAL_COMMUNITY): Payer: Self-pay | Admitting: Emergency Medicine

## 2011-10-25 ENCOUNTER — Encounter: Payer: Managed Care, Other (non HMO) | Admitting: Family Medicine

## 2011-10-25 DIAGNOSIS — K625 Hemorrhage of anus and rectum: Secondary | ICD-10-CM | POA: Insufficient documentation

## 2011-10-25 DIAGNOSIS — N508 Other specified disorders of male genital organs: Secondary | ICD-10-CM | POA: Insufficient documentation

## 2011-10-25 DIAGNOSIS — N5089 Other specified disorders of the male genital organs: Secondary | ICD-10-CM

## 2011-10-25 DIAGNOSIS — N501 Vascular disorders of male genital organs: Secondary | ICD-10-CM | POA: Insufficient documentation

## 2011-10-25 DIAGNOSIS — I10 Essential (primary) hypertension: Secondary | ICD-10-CM | POA: Insufficient documentation

## 2011-10-25 DIAGNOSIS — G4733 Obstructive sleep apnea (adult) (pediatric): Secondary | ICD-10-CM | POA: Insufficient documentation

## 2011-10-25 NOTE — ED Provider Notes (Signed)
History     CSN: [id number]  Arrival date & time 10/25/11  1925   First MD Initiated Contact with Patient 10/25/11 2237      Chief Complaint  Patient presents with  . Rectal Bleeding    (Consider location/radiation/quality/duration/timing/severity/associated sxs/prior treatment) HPI Leonard Lewis is a 31 y.o. male presents with c/o blood in underwear, one time today leading to desire to be assessed in the ED. The sx(s) have been present for 1 day. Additional concerns are he has had a palpable nodule on the right side of his scrotum for 2 weeks. Causative factors are not known. Palliative factors are nothing. The distress associated is mild. The disorder has been present for 2 weeks. Patient scheduled an appointment to be evaluated for the nodule tomorrow, by his PCP.  He denies abdominal pain, change in bowel or urinary habits, fever, chills, weakness, nausea or vomiting. No urethral discharge has been noted. He takes his blood pressure medicines sporadically, and has not had any for 2 days.       Past Medical History  Diagnosis Date  . CHF (congestive heart failure) 2008    EF 30-50%  . Obstructive sleep apnea   . Obesity, morbid   . Hypertension     History reviewed. No pertinent past surgical history.  Family History  Problem Relation Age of Onset  . Hypertension Mother   . Hypertension Father     History  Substance Use Topics  . Smoking status: Former Smoker    Types: Cigarettes  . Smokeless tobacco: Not on file  . Alcohol Use: No      Review of Systems  All other systems reviewed and are negative.    Allergies  Review of patient's allergies indicates no known allergies.  Home Medications   Current Outpatient Rx  Name Route Sig Dispense Refill  . AMLODIPINE BESYLATE 10 MG PO TABS Oral Take 1 tablet (10 mg total) by mouth daily. 90 tablet 3  . CARVEDILOL PHOSPHATE ER 80 MG PO CP24 Oral Take 1 capsule (80 mg total) by mouth daily. 90 capsule 3  .  LISINOPRIL 40 MG PO TABS Oral Take 40 mg by mouth daily.      BP 178/132  Pulse 111  Temp(Src) 98.6 F (37 C) (Oral)  Resp 20  SpO2 98%  Physical Exam  Nursing note and vitals reviewed. Constitutional: He is oriented to person, place, and time. He appears well-developed and well-nourished.  HENT:  Head: Normocephalic and atraumatic.  Right Ear: External ear normal.  Left Ear: External ear normal.  Eyes: Conjunctivae and EOM are normal. Pupils are equal, round, and reactive to light.  Neck: Normal range of motion and phonation normal. Neck supple.  Cardiovascular: Normal rate, regular rhythm, normal heart sounds and intact distal pulses.   Pulmonary/Chest: Effort normal and breath sounds normal. He exhibits no bony tenderness.  Abdominal: Soft. Normal appearance. He exhibits no mass. There is no tenderness. There is no guarding.  Genitourinary:       External genitalia, normal for obese man. He is uncircumcised. No urethral discharge. No apparent groin or inguinal abnormality, the scrotum is nontender, and 2  normal testes are palpated. The right scrotum has a semi-indurated area, 4 mm, with a central, red, and bleeding, mass, measuring 3 x 4 x 1.5 mm. The mass is essentially a polyp in appearance. It is pink in color and has several areas of mild bleeding. The bleeding tends to stop with light pressure, but recurs  after a few minutes. A small clot was removed from the polyp to complete the exam.  Musculoskeletal: Normal range of motion.  Neurological: He is alert and oriented to person, place, and time. He has normal strength. No cranial nerve deficit or sensory deficit. He exhibits normal muscle tone. Coordination normal.  Skin: Skin is warm, dry and intact.  Psychiatric: He has a normal mood and affect. His behavior is normal. Judgment and thought content normal.    ED Course  Procedures (including critical care time)  Labs Reviewed - No data to display No results found.   1.  Scrotal mass   2. Hypertension       MDM  Scrotal mass, polyp, causing bleeding. Patient has incidental hypertension associated with noncompliance with medical treatment. He has a followup on the schedule for tomorrow with his PCP, where his BP can be assessed and treated.  The scrotal mass can be addressed as an outpatient by a urologist. Doubt hypertensive crisis.        Flint Melter, MD 10/25/11 2328

## 2011-10-25 NOTE — ED Notes (Signed)
Pt st's he went to bathroom earlier today and noticed bright red blood in his underwear.  Pt denies any abd pain.   Pt HTN in triage st's he has hx of same but has not had his B/P meds since Sun. Due to not having money to get it filled

## 2011-10-25 NOTE — ED Notes (Signed)
Patient   Lesion or polyp  to right scrotum  With bleeding.  Dr Effie Shy in room  Doing exam

## 2011-10-26 ENCOUNTER — Encounter: Payer: Self-pay | Admitting: Family Medicine

## 2011-10-26 ENCOUNTER — Ambulatory Visit (INDEPENDENT_AMBULATORY_CARE_PROVIDER_SITE_OTHER): Payer: Managed Care, Other (non HMO) | Admitting: Family Medicine

## 2011-10-26 ENCOUNTER — Telehealth: Payer: Self-pay | Admitting: Family Medicine

## 2011-10-26 VITALS — BP 210/100 | HR 103 | Temp 97.9°F | Ht 73.0 in | Wt >= 6400 oz

## 2011-10-26 DIAGNOSIS — R7309 Other abnormal glucose: Secondary | ICD-10-CM

## 2011-10-26 DIAGNOSIS — G4733 Obstructive sleep apnea (adult) (pediatric): Secondary | ICD-10-CM

## 2011-10-26 DIAGNOSIS — I160 Hypertensive urgency: Secondary | ICD-10-CM | POA: Insufficient documentation

## 2011-10-26 DIAGNOSIS — E785 Hyperlipidemia, unspecified: Secondary | ICD-10-CM

## 2011-10-26 DIAGNOSIS — I10 Essential (primary) hypertension: Secondary | ICD-10-CM

## 2011-10-26 DIAGNOSIS — N508 Other specified disorders of male genital organs: Secondary | ICD-10-CM

## 2011-10-26 DIAGNOSIS — E669 Obesity, unspecified: Secondary | ICD-10-CM

## 2011-10-26 DIAGNOSIS — R739 Hyperglycemia, unspecified: Secondary | ICD-10-CM | POA: Insufficient documentation

## 2011-10-26 DIAGNOSIS — N5089 Other specified disorders of the male genital organs: Secondary | ICD-10-CM

## 2011-10-26 DIAGNOSIS — Z23 Encounter for immunization: Secondary | ICD-10-CM

## 2011-10-26 MED ORDER — METOPROLOL TARTRATE 100 MG PO TABS: 100.0000 mg | ORAL_TABLET | Freq: Two times a day (BID) | ORAL | Status: DC

## 2011-10-26 NOTE — Assessment & Plan Note (Signed)
Qualifies for lap band surgery. He states he is still discussing this with his family. He elected try to lose weight on his own. He has been referred to dietitian for the past but cannot afford this. Did have a long discussion with deleterious effects of weight on his health.

## 2011-10-26 NOTE — Patient Instructions (Signed)
We are going to switch you to Metoprolol 100 mg twice a day. We may need to go to 200 mg a day if necessary. Come back either Thursday or Friday so we can recheck your blood pressure. Make a nurses visit on her way out today. We will refer you to a urologist as well.

## 2011-10-26 NOTE — Progress Notes (Signed)
  Subjective:    Patient ID: Leonard Lewis, male    DOB: 06/14/1981, 31 y.o.   MRN: [id number]  HPI Hypertension:  Long-term problem for this patient.  No adverse effects from medication.  Not checking it regularly.  No HA, CP, dizziness, shortness of breath, palpitations, or LE swelling.  States he only takes his blood pressure medicines intermittently. He has been out of Coreg as well as the past couple weeks. BP Readings from Last 3 Encounters:  10/26/11 210/100  10/25/11 187/128  12/29/10 183/127   2.  sleep apnea: Patient was diagnosed with sleep apnea in the past. However he states he is not using CPAP regularly. He and his family members but says that he snores.  3.  Mass on groin:  Present for 2-3 weeks. He states it has been growing. Went to the ER last night because it suddenly started bleeding. States blood soaked through his underwear and pants.  No pain. No dysuria or urethral discharge.  No other masses noted in groin.  4.  hyperglycemia: Patient has history of elevated blood sugars in the past. He also has A1c back in February of 6.4. Recheck of her diabetes today.  The following portions of the patient's history were reviewed and updated as appropriate: allergies, current medications, past medical history, family and social history, and problem list, including fact he is nonsmoker.   Review of Systems See HPI above for review of systems.       Objective:   Physical Exam Gen:  Alert, cooperative patient who appears stated age in no acute distress.  Vital signs reviewed.  Obese HEENT:  [place]/AT.  EOMI, PERRL.  MMM, tonsils non-erythematous +4 in size.  External ears WNL, Bilateral TM's normal without retraction, redness or bulging.  Neck: No masses or thyromegaly or limitation in range of motion.  No cervical lymphadenopathy. Cardiac:  Regular rate and rhythm without murmur auscultated.  Good S1/S2. Pulm:  Clear to auscultation bilaterally with good air movement.  No wheezes  or rales noted.   Abd:  Soft/nondistended/nontender.  Good bowel sounds throughout all four quadrants.  No masses noted.   Morbidly obese Ext:  No clubbing/cyanosis/erythema.  No edema noted bilateral lower extremities.   Neuro:  Alert and oriented to person, place, and date.  CN II-XII intact.  No focal deficits noted.   GU:  Red pedunculated mass about 0.5 cm in size extending from right groin. No bleeding currently. No urethral discharge noted. No tenderness. No hernias noted. No other scrotal masses noted.        Assessment & Plan:

## 2011-10-26 NOTE — Assessment & Plan Note (Signed)
Changing him to metoprolol due to finances as this is on $4 list [facility].. We will wait and see what his blood pressure checked on Thursday or Friday will be. Please see hypertensive urgency above.

## 2011-10-26 NOTE — Assessment & Plan Note (Signed)
No evidence of end-stage dysfunction. He is to take his blood pressure medicine today as soon as he gets home We are checking labs today. He has not been taking his medications. We will switch him to metoprolol as noted below. He is to come back Thursday or Friday for blood pressure recheck.

## 2011-10-26 NOTE — Assessment & Plan Note (Signed)
Check A1c today.

## 2011-10-26 NOTE — Assessment & Plan Note (Signed)
Seems to be a polyp.  ED physician also thinks so. Seems to only be connected to subcutaneous tissue, not to underlying testes. Refer to Urology for removal.

## 2011-10-26 NOTE — Telephone Encounter (Signed)
Leonard Lewis still waiting for his rx for metoprolol to be sent to pharmacy

## 2011-10-26 NOTE — Telephone Encounter (Signed)
Done.  Please apologize for my forgetfulness, this should have been sent in earlier.

## 2011-10-26 NOTE — Assessment & Plan Note (Signed)
Encouraged to wear CPAP nightly.   Discussed that all blood pressure medications he is on will not lower his BP the way CPAP will.

## 2011-10-27 ENCOUNTER — Encounter: Payer: Self-pay | Admitting: Family Medicine

## 2011-10-27 LAB — COMPREHENSIVE METABOLIC PANEL
Alkaline Phosphatase: 60 U/L (ref 39–117)
BUN: 15 mg/dL (ref 6–23)
Glucose, Bld: 96 mg/dL (ref 70–99)
Sodium: 139 mEq/L (ref 135–145)
Total Bilirubin: 0.3 mg/dL (ref 0.3–1.2)
Total Protein: 7.4 g/dL (ref 6.0–8.3)

## 2011-10-27 LAB — CBC
Hemoglobin: 12.7 g/dL — ABNORMAL LOW (ref 13.0–17.0)
MCH: 21.7 pg — ABNORMAL LOW (ref 26.0–34.0)
MCV: 69.6 fL — ABNORMAL LOW (ref 78.0–100.0)
RBC: 5.86 MIL/uL — ABNORMAL HIGH (ref 4.22–5.81)

## 2011-10-27 NOTE — Assessment & Plan Note (Signed)
He has eaten breakfast. Will obtain direct LDL.

## 2012-02-29 ENCOUNTER — Ambulatory Visit (INDEPENDENT_AMBULATORY_CARE_PROVIDER_SITE_OTHER): Payer: Managed Care, Other (non HMO) | Admitting: Family Medicine

## 2012-02-29 ENCOUNTER — Encounter: Payer: Self-pay | Admitting: Family Medicine

## 2012-02-29 VITALS — BP 170/125 | HR 69 | Temp 98.3°F | Ht 73.0 in | Wt >= 6400 oz

## 2012-02-29 DIAGNOSIS — L600 Ingrowing nail: Secondary | ICD-10-CM

## 2012-02-29 MED ORDER — TRIAMCINOLONE ACETONIDE 0.1 % EX CREA: TOPICAL_CREAM | Freq: Two times a day (BID) | CUTANEOUS | Status: DC

## 2012-02-29 MED ORDER — DICLOFENAC SODIUM 75 MG PO TBEC: 75.0000 mg | DELAYED_RELEASE_TABLET | Freq: Two times a day (BID) | ORAL | Status: DC

## 2012-02-29 NOTE — Patient Instructions (Addendum)
Soak nail in warm, soapy water for about 20 minutes 3 times a week. You may add Epsom salts.  Apply steroid cream.  Keep toenail covered with bandage and socks.  Take diclofenac (pain medication) twice a day as needed.   If your work permits, follow-up to have the nail removed.

## 2012-02-29 NOTE — Assessment & Plan Note (Signed)
Recommend removal of toenail and ablation since he had the same toenail partially removed int he past but he has had recurrent problems.  He will ask his work place if he is able to take a few weeks off work since last time he was out of work for 3-4 weeks because of the pain.  In the meantime, warm soaks, diclofenac prn, TAC 0.1%, keeping toenail clean and dry.  Follow-up in 1-2 weeks for toenail removal.

## 2012-02-29 NOTE — Progress Notes (Signed)
  Subjective:    Patient ID: Leonard Lewis, male    DOB: 1981-02-04, 31 y.o.   MRN: [id number]  HPI Acute visit: ingrown left toe nail It has been bothering him for several months but started worsening significantly a few weeks ago He had that toenail partially removed int he past He denies nausea/vomiting, fevers/chills, rash.  He is not taking any medication for pain His job requires fixing ATMs and driving.   Review of Systems Per HPI.  Past Medical History, Family History, and Social History reviewed.  History of hyperglycemia. HgbA1c 6.0    Objective:   Physical Exam Gen: NAD; morbidly obese; African-American Psych: alert and oriented, engaged, appropriate Skin:   Left first toe nail; ingrown with area of swelling, significant tenderness right lateral edge; toenail is cracked with area of broken skin and drainage; no pus or erythema; no rash on toe or foot    Assessment & Plan:

## 2012-03-09 ENCOUNTER — Ambulatory Visit (INDEPENDENT_AMBULATORY_CARE_PROVIDER_SITE_OTHER): Payer: Managed Care, Other (non HMO) | Admitting: Family Medicine

## 2012-03-09 ENCOUNTER — Encounter: Payer: Self-pay | Admitting: Family Medicine

## 2012-03-09 VITALS — BP 154/86 | HR 99 | Temp 97.0°F | Ht 73.0 in | Wt >= 6400 oz

## 2012-03-09 DIAGNOSIS — L600 Ingrowing nail: Secondary | ICD-10-CM

## 2012-03-09 DIAGNOSIS — I10 Essential (primary) hypertension: Secondary | ICD-10-CM

## 2012-03-09 DIAGNOSIS — I160 Hypertensive urgency: Secondary | ICD-10-CM

## 2012-03-09 MED ORDER — HYDROCODONE-ACETAMINOPHEN 5-500 MG PO TABS: 1.0000 | ORAL_TABLET | Freq: Three times a day (TID) | ORAL | Status: AC | PRN

## 2012-03-09 NOTE — Patient Instructions (Signed)
Keep the bandage on for 24 hours.   After that, soak your foot.   Toenail Removal Toenails may need to be removed because of injury, infections, or to correct abnormal growth. A special non-stick bandage will likely be put tightly on your toe to prevent bleeding. Often times a new nail will grow back. Sometimes the new nail may be deformed. Most of the time when a nail is lost, it will gradually heal, but may be sensitive for a long time. HOME CARE INSTRUCTIONS   Keep your foot elevated to relieve pain and swelling. This will require lying in bed or on a couch with the leg on pillows or sitting in a recliner with the leg up. Walking or letting your leg dangle may increase swelling, slow healing, and cause throbbing pain.   Keep your bandage dry and clean.   Change your bandage in 24 hours.   After your bandage is changed, soak your foot in warm, soapy water for 10 to 20 minutes. Do this 3 times per day. This helps reduce pain and swelling. After soaking your foot apply a clean, dry bandage. Change your bandage if it is wet or dirty.   Only take over-the-counter or prescription medicines for pain, discomfort, or fever as directed by your caregiver.   See your caregiver as needed for problems.  You might need a tetanus shot now if:  You have no idea when you had the last one.   You have never had a tetanus shot before.   The injured area had dirt in it.  If you need a tetanus shot, and you decide not to get one, there is a rare chance of getting tetanus. Sickness from tetanus can be serious. If you did get a tetanus shot, your arm may swell, get red and warm to the touch at the shot site. This is common and not a problem. SEEK IMMEDIATE MEDICAL CARE IF:   You have increased pain, swelling, redness, warmth, drainage, or bleeding.   You have a fever.   You have swelling that spreads from your toe into your foot.  Document Released: 06/19/2003 Document Revised: 09/09/2011 Document  Reviewed: 09/30/2008 [facility] Patient Information 2012 [place], [place].

## 2012-03-10 NOTE — Assessment & Plan Note (Signed)
Informed consent obtained and placed in chart, time out performed.  Area prepped and draped in usual sterile fashion, prepped with Iodoform swab x 3. Using 2% lidocaine withOUT Epi, digital block performed Left great toe.  Anaesthesia achieved.  Using nail elevator nail was elevated from nailbed and then grasped with hemostats and removed from toe.  Patient tolerated procedure well.  Antibiotic ointment applied and sterile dressing applied.  Post-care instructions given. EBL < 5 cc.

## 2012-03-10 NOTE — Assessment & Plan Note (Addendum)
Initially elevated -- retested after toenail removed and patient more relaxed, BP much better at that time.   FU in 1 month for BP recheck

## 2012-03-10 NOTE — Progress Notes (Signed)
  Subjective:    Patient ID: Leonard Lewis, male    DOB: [birth year], 32 y.o.   MRN: [id number]  HPI Patient here for toenail removal.  Has suffered from ingrown toenail for past 4 - 6months, unsure exact time course.  Has soaked foot without relief.  Now painful to walk or bear weight.  Desires removal. Has history of lateral removal in past.   No fevers or chills, no redness streaking from toe, no purulence.   Review of Systems See HPI above for review of systems.       Objective:   Physical Exam Gen:  Alert, cooperative patient who appears stated age in no acute distress.  Vital signs reviewed. Toe:  Left great toe with medial aspect of toenail removed.  Tender to palpation, able to express some purulent fluid from medial aspect of toenail.         Assessment & Plan:

## 2012-03-13 ENCOUNTER — Ambulatory Visit: Payer: Managed Care, Other (non HMO) | Admitting: Family Medicine

## 2012-03-23 ENCOUNTER — Telehealth: Payer: Self-pay | Admitting: Family Medicine

## 2012-03-23 NOTE — Telephone Encounter (Signed)
Leonard Lewis, i spoke with patient and it is due to the toenail issue. He is coming in tues 6/25 @ 4:00pm to go over this with you

## 2012-03-23 NOTE — Telephone Encounter (Signed)
Leonard Paula, Do you know anything about these papers?

## 2012-03-23 NOTE — Telephone Encounter (Signed)
I received disability paperwork from Mr. Andria Meuse.  I wasn't sure why, I'm not sure why he would be applying for disability. If so, he will need to make an appt to go over this.  Can you let him know if it's for FMLA for his toenail removal I will go over it with him?  If it's for actual general disability, however, I'm not qualified to do disability assessments and he would need to go through Dept of Social Services.

## 2012-03-23 NOTE — Telephone Encounter (Signed)
Checking status of insurance papers that were faxed to [place] the last few days. Did not see them in Dr. Westly Pam box.

## 2012-03-24 NOTE — Telephone Encounter (Signed)
Great thanks will see him then.

## 2012-03-28 ENCOUNTER — Ambulatory Visit (INDEPENDENT_AMBULATORY_CARE_PROVIDER_SITE_OTHER): Payer: Managed Care, Other (non HMO) | Admitting: Family Medicine

## 2012-03-28 ENCOUNTER — Encounter: Payer: Self-pay | Admitting: Family Medicine

## 2012-03-28 VITALS — BP 211/138 | HR 105 | Ht 73.0 in | Wt >= 6400 oz

## 2012-03-28 DIAGNOSIS — I160 Hypertensive urgency: Secondary | ICD-10-CM

## 2012-03-28 DIAGNOSIS — L600 Ingrowing nail: Secondary | ICD-10-CM

## 2012-03-28 DIAGNOSIS — I10 Essential (primary) hypertension: Secondary | ICD-10-CM

## 2012-03-28 NOTE — Progress Notes (Signed)
Patient ID: Leonard Lewis, male   DOB: 1980-12-13, 31 y.o.   MRN: [id number] Leonard Lewis is a 31 y.o. male who presents to [facility] today to have paperwork filled out since he missed work for 3 days after his dinner meal.  One. [place] will. This is doing well and has no consultations for this. No redness or swelling. No pain. No infection. He needs paperwork filled out since he missed 3 days s work and I was able to fill this out with him present However the other issue that arose today is as below.  2.  hypertensive urgency: Patient states that he took her blood pressure medicines but an hour prior to coming in. However he cannot remember the last time he took any blood pressure medications. He is known heart failure as well as sleep apnea he does not use his CPAP machine at home. He states he's using maybe once in the past 1-2 months. He currently denies any chest pain, shortness of breath, palpitations. No difficulty breathing. No lower extremity edema. No weakness or numbness bilateral upper lobe extremities. No drooling or difficulty with speech. No abdominal pain.   The following portions of the patient's history were reviewed and updated as appropriate: allergies, current medications, past medical history, family and social history, and problem list.  Patient is a nonsmoker.   ROS as above otherwise neg.  Medications reviewed. Current Outpatient Prescriptions  Medication Sig Dispense Refill  . amLODipine (NORVASC) 10 MG tablet Take 1 tablet (10 mg total) by mouth daily.  90 tablet  3  . carvedilol (COREG CR) 80 MG 24 hr capsule Take 1 capsule (80 mg total) by mouth daily.  90 capsule  3  . diclofenac (VOLTAREN) 75 MG EC tablet Take 1 tablet (75 mg total) by mouth 2 (two) times daily.  30 tablet  0  . HYDROcodone-acetaminophen (VICODIN) 5-500 MG per tablet       . lisinopril (PRINIVIL,ZESTRIL) 40 MG tablet Take 40 mg by mouth daily.      . metoprolol (LOPRESSOR) 100 MG tablet Take 1  tablet (100 mg total) by mouth 2 (two) times daily.  180 tablet  3  . triamcinolone cream (KENALOG) 0.1 % Apply topically 2 (two) times daily.  30 g  0    Exam:  BP 211/138  Pulse 105  Ht 6\' 1"  (1.854 m)  Wt 430 lb (195.047 kg)  BMI 56.73 kg/m2 Gen: Well NAD, morbidly obese HEENT: EOMI,  MMM.  Fundoscopy with clear cup-to-disc margins BL Lungs: CTABL Nl WOB Heart: RRR no MRG Abd: NABS, NT, ND Exts: Non edematous BL  LE, warm and well perfused.  Left great toe healing well s/p toenail removal.  No redness or drainage noted.  Neuro:  CN II - XII intact.  No sensory or motor deficits BL upper or lower extremities.   No results found for this or any previous visit (from the past 72 hour(s)).

## 2012-03-28 NOTE — Assessment & Plan Note (Signed)
No signs of emergency on exam today. Reiterated compliance.   Patient cannot recall his exact BP meds today and admits to very infrequent blood pressure usage. Forgetting is main barrier. Also with OSA and not using CPAP. He is to return later this week for FU blood pressure check.  He can take an extra 10 mg of Amlodipine this evening when he gets home.  He is to take his regularly scheduled medications starting tomorrow AM. Reiterated importance of compliance with CPAP. Instructions provided regarding what to watch for concerning acute heart failure, signs/symptoms of stroke, signs/symptoms of acute MI.

## 2012-03-28 NOTE — Assessment & Plan Note (Signed)
Doing well.   Healing well, filled out needed paperwork today.

## 2012-03-28 NOTE — Patient Instructions (Addendum)
Come back for a nurse visit in later this week for a blood pressure check. Make sure to take your medicine as prescribed. Make sure to use your CPAP.

## 2012-06-03 ENCOUNTER — Encounter (HOSPITAL_COMMUNITY): Payer: Self-pay | Admitting: *Deleted

## 2012-06-03 ENCOUNTER — Other Ambulatory Visit: Payer: Self-pay | Admitting: Family Medicine

## 2012-06-03 ENCOUNTER — Emergency Department (HOSPITAL_COMMUNITY): Payer: Managed Care, Other (non HMO)

## 2012-06-03 ENCOUNTER — Emergency Department (HOSPITAL_COMMUNITY)
Admission: EM | Admit: 2012-06-03 | Discharge: 2012-06-03 | Disposition: A | Discharge status: Home / Self Care | Payer: Managed Care, Other (non HMO) | Attending: Emergency Medicine | Admitting: Emergency Medicine

## 2012-06-03 ENCOUNTER — Other Ambulatory Visit: Payer: Self-pay

## 2012-06-03 DIAGNOSIS — W010XXA Fall on same level from slipping, tripping and stumbling without subsequent striking against object, initial encounter: Secondary | ICD-10-CM | POA: Insufficient documentation

## 2012-06-03 DIAGNOSIS — G4733 Obstructive sleep apnea (adult) (pediatric): Secondary | ICD-10-CM | POA: Insufficient documentation

## 2012-06-03 DIAGNOSIS — I10 Essential (primary) hypertension: Secondary | ICD-10-CM | POA: Insufficient documentation

## 2012-06-03 DIAGNOSIS — M25569 Pain in unspecified knee: Secondary | ICD-10-CM | POA: Insufficient documentation

## 2012-06-03 DIAGNOSIS — M79609 Pain in unspecified limb: Secondary | ICD-10-CM | POA: Insufficient documentation

## 2012-06-03 DIAGNOSIS — N289 Disorder of kidney and ureter, unspecified: Secondary | ICD-10-CM | POA: Insufficient documentation

## 2012-06-03 DIAGNOSIS — W19XXXA Unspecified fall, initial encounter: Secondary | ICD-10-CM

## 2012-06-03 DIAGNOSIS — I509 Heart failure, unspecified: Secondary | ICD-10-CM | POA: Insufficient documentation

## 2012-06-03 DIAGNOSIS — T148XXA Other injury of unspecified body region, initial encounter: Secondary | ICD-10-CM

## 2012-06-03 DIAGNOSIS — Y9229 Other specified public building as the place of occurrence of the external cause: Secondary | ICD-10-CM | POA: Insufficient documentation

## 2012-06-03 LAB — BASIC METABOLIC PANEL
CO2: 29 mEq/L (ref 19–32)
Calcium: 10 mg/dL (ref 8.4–10.5)
Chloride: 99 mEq/L (ref 96–112)
Sodium: 139 mEq/L (ref 135–145)

## 2012-06-03 LAB — CBC WITH DIFFERENTIAL/PLATELET
Eosinophils Relative: 2 % (ref 0–5)
Lymphocytes Relative: 43 % (ref 12–46)
Monocytes Absolute: 0.9 10*3/uL (ref 0.1–1.0)
Monocytes Relative: 8 % (ref 3–12)
Neutrophils Relative %: 47 % (ref 43–77)
Platelets: 326 10*3/uL (ref 150–400)
RBC: 5.71 MIL/uL (ref 4.22–5.81)
WBC: 10.9 10*3/uL — ABNORMAL HIGH (ref 4.0–10.5)

## 2012-06-03 LAB — POCT I-STAT TROPONIN I: Troponin i, poc: 0.04 ng/mL (ref 0.00–0.08)

## 2012-06-03 MED ORDER — IBUPROFEN 400 MG PO TABS
800.0000 mg | ORAL_TABLET | Freq: Once | ORAL | Status: AC
  Administered 2012-06-03: 800 mg via ORAL
  Filled 2012-06-03: qty 2

## 2012-06-03 MED ORDER — CARVEDILOL 25 MG PO TABS
25.0000 mg | ORAL_TABLET | ORAL | Status: DC
  Filled 2012-06-03: qty 1

## 2012-06-03 MED ORDER — AMLODIPINE BESYLATE 10 MG PO TABS
10.0000 mg | ORAL_TABLET | Freq: Once | ORAL | Status: DC
Start: 2012-06-03 — End: 2012-06-03
  Filled 2012-06-03: qty 1

## 2012-06-03 MED ORDER — CLONIDINE HCL 0.1 MG PO TABS
0.2000 mg | ORAL_TABLET | Freq: Once | ORAL | Status: AC
Start: 2012-06-03 — End: 2012-06-03
  Administered 2012-06-03: 0.2 mg via ORAL
  Filled 2012-06-03: qty 2

## 2012-06-03 MED ORDER — HYOSCYAMINE SULFATE 0.125 MG PO TABS
0.2500 mg | ORAL_TABLET | Freq: Once | ORAL | Status: DC
  Filled 2012-06-03: qty 2

## 2012-06-03 MED ORDER — HYOSCYAMINE SULFATE 0.125 MG/ML PO SOLN: 0.2500 mg | Freq: Once | ORAL | Status: DC

## 2012-06-03 NOTE — ED Notes (Signed)
The  bp of 150 was on the pts forearm and not accurate

## 2012-06-03 NOTE — ED Notes (Signed)
The pr fell in a store earlier today he has pain in both lower exttremities and his rt hand

## 2012-06-03 NOTE — ED Provider Notes (Signed)
History     CSN: [id number]  Arrival date & time 06/03/12  1919   First MD Initiated Contact with Patient 06/03/12 2009      Chief Complaint  Patient presents with  . Fall   HPI  History provided by the patient. Patient is a 31 year old morbidly obese African American male with history of hypertension, sleep apnea and CHF who presents with complaints of right hand and right knee pain after a fall. She reports walking in [place] when he slipped on a wet floor causing the fall back onto his bottom. Patient also braced fall with right hand. He complained of some pain in his right index finger that he noticed when he grabs his belt to pull up his pants. He denies any decreased range of motion of finger. No swelling or deformity. No numbness or tingling. Patient also felt some pains in right knee after standing and walking some. He reports past history of knee pains and problems occasionally. he has been and Lipitor he otherwise. There was no head injury or LOC. Patient denies any neck or back pains. He has no other complaints from a fall.    Past Medical History  Diagnosis Date  . CHF (congestive heart failure) 2008    EF 30-50%  . Obstructive sleep apnea   . Obesity, morbid   . Hypertension     History reviewed. No pertinent past surgical history.  Family History  Problem Relation Age of Onset  . Hypertension Mother   . Hypertension Father     History  Substance Use Topics  . Smoking status: Former Smoker    Types: Cigarettes  . Smokeless tobacco: Not on file  . Alcohol Use: No      Review of Systems  Constitutional: Negative for fever and chills.  HENT: Negative for neck pain.   Respiratory: Positive for shortness of breath. Negative for cough and wheezing.   Cardiovascular: Positive for leg swelling. Negative for chest pain and palpitations.  Gastrointestinal: Negative for nausea and abdominal pain.  Musculoskeletal: Negative for back pain.  Neurological: Negative  for dizziness, weakness, light-headedness, numbness and headaches.    Allergies  Review of patient's allergies indicates no known allergies.  Home Medications   Current Outpatient Rx  Name Route Sig Dispense Refill  . AMLODIPINE BESYLATE 10 MG PO TABS Oral Take 1 tablet (10 mg total) by mouth daily. 90 tablet 3  . ASPIRIN EC 81 MG PO TBEC Oral Take 81 mg by mouth daily.    Marland Kitchen CARVEDILOL PHOSPHATE ER 80 MG PO CP24 Oral Take 1 capsule (80 mg total) by mouth daily. 90 capsule 3  . LISINOPRIL 40 MG PO TABS Oral Take 40 mg by mouth daily.      BP 197/142  Pulse 110  Temp 97 F (36.1 C) (Oral)  Resp 36  SpO2 95%  Physical Exam  Nursing note and vitals reviewed. Constitutional: He is oriented to person, place, and time. He appears well-developed and well-nourished. No distress.  HENT:  Head: Normocephalic and atraumatic.  Neck: Normal range of motion. Neck supple.  Cardiovascular: Regular rhythm.  Tachycardia present.   No murmur heard. Pulmonary/Chest: Breath sounds normal. Tachypnea noted. He has no wheezes. He has no rhonchi. He has no rales.  Abdominal: Soft. There is no tenderness.       Morbidly obese  Musculoskeletal: He exhibits edema. He exhibits no tenderness.       Cervical back: Normal.       Thoracic back:  Normal.       Lumbar back: Normal.       Bilateral 2+ pitting edema in lower extremities to the knee. Patient with mild tenderness around right knee. No gross deformities. Normal range of motion. Able to stand and bear weight normally. Remaining exam difficult secondary to body habitus. No gross instability of joint. Patient ambulates without assistance.  Patient reports mild pains in right proximal phalanx of second digit. There is normal medial and lateral sensations to light touch of the finger. Normal cap refill in all fingers. Normal grip strength. Normal range of motion of the finger and hand. No gross deformities. No significant swelling.  Neurological: He is  alert and oriented to person, place, and time.  Skin: Skin is warm.  Psychiatric: He has a normal mood and affect. His behavior is normal.    ED Course  Procedures   Results for orders placed during the hospital encounter of 06/03/12  CBC WITH DIFFERENTIAL      Component Value Range   WBC 10.9 (*) 4.0 - 10.5 K/uL   RBC 5.71  4.22 - 5.81 MIL/uL   Hemoglobin 12.6 (*) 13.0 - 17.0 g/dL   HCT 16.1  09.6 - 04.5 %   MCV 68.3 (*) 78.0 - 100.0 fL   MCH 22.1 (*) 26.0 - 34.0 pg   MCHC 32.3  30.0 - 36.0 g/dL   RDW 40.9 (*) 81.1 - 91.4 %   Platelets 326  150 - 400 K/uL   Neutrophils Relative 47  43 - 77 %   Lymphocytes Relative 43  12 - 46 %   Monocytes Relative 8  3 - 12 %   Eosinophils Relative 2  0 - 5 %   Basophils Relative 0  0 - 1 %   Neutro Abs 5.1  1.7 - 7.7 K/uL   Lymphs Abs 4.7 (*) 0.7 - 4.0 K/uL   Monocytes Absolute 0.9  0.1 - 1.0 K/uL   Eosinophils Absolute 0.2  0.0 - 0.7 K/uL   Basophils Absolute 0.0  0.0 - 0.1 K/uL   Smear Review LARGE PLATELETS PRESENT    BASIC METABOLIC PANEL      Component Value Range   Sodium 139  135 - 145 mEq/L   Potassium 3.6  3.5 - 5.1 mEq/L   Chloride 99  96 - 112 mEq/L   CO2 29  19 - 32 mEq/L   Glucose, Bld 97  70 - 99 mg/dL   BUN 18  6 - 23 mg/dL   Creatinine, Ser 7.82 (*) 0.50 - 1.35 mg/dL   Calcium 95.6  8.4 - 21.3 mg/dL   GFR calc non Af Amer 57 (*) >90 mL/min   GFR calc Af Amer 66 (*) >90 mL/min  POCT I-STAT TROPONIN I      Component Value Range   Troponin i, poc 0.04  0.00 - 0.08 ng/mL   Comment 3               Dg Chest 2 View  06/03/2012  *RADIOLOGY REPORT*  Clinical Data: Shortness of breath.  CHEST - 2 VIEW  Comparison: 03/04/2007  Findings: Lungs are adequately inflated without consolidation or effusion.  The cardiomediastinal silhouette and remainder of the exam is unchanged.  IMPRESSION: No acute cardiopulmonary disease.   Original Report Authenticated By: Elba Barman, M.D.      1. Fall   2. Hypertension   3. Renal  insufficiency   4. Muscle strain  MDM  8:10 PM patient seen and evaluated. Patient with minor complaints of right index finger or hand pain some right knee pain. Patient is ambulatory.  Patient admits to irregular use of blood pressure medications. Did take one dose of lisinopril today but has not taken any of his meds recently. Patient does note having some shortness of breath recently unchanged from fall. Patient currently with tachypnea and hypertension. Patient with pitting edema bilateral lower extremities. No significant rales on exam however will obtain Chest x-ray to rule out fluid overload. Will also treat blood pressure.  Patient continues to deny any other symptoms. Chest x-ray unremarkable. Blood pressure has slowly improved. At this time patient instructed to have close followup with PCP and continue blood pressure medications at home.      Date: 06/03/2012  Rate: 96  Rhythm: normal sinus rhythm  QRS Axis: left  Intervals: normal  ST/T Wave abnormalities: nonspecific ST/T changes  Conduction Disutrbances:none  Narrative Interpretation:   Old EKG Reviewed: No significant changes from April 2001    Phill Mutter [place], [place] 06/03/12 2327

## 2012-06-04 NOTE — ED Provider Notes (Signed)
Medical screening examination/treatment/procedure(s) were conducted as a shared visit with non-physician practitioner(s) and myself.  I personally evaluated the patient during the encounter  [name] [name], MD 06/04/12 2326 

## 2012-06-13 ENCOUNTER — Telehealth: Payer: Self-pay | Admitting: Family Medicine

## 2012-06-13 NOTE — Telephone Encounter (Signed)
Pt i s asking about meds that he was given (Ropinirole .25mg )  He had asked for it to be 1mg  - is now out and needs more called into [facility]  Also, is asking about the dental referral that was placed for him several weeks ago - wants to know status.

## 2012-06-13 NOTE — Telephone Encounter (Signed)
Spoke with pt and did not see where we or the ED gave pt this med he has and appt on Monday and advised him to discuss this with Dr. Gwendolyn Grant at that time pt voiced understanding and agreed.. Will forward to pcp.Loralee Pacas [place]

## 2012-06-14 NOTE — Telephone Encounter (Signed)
The medication he received is Hyoscamine 0.25 mg..  I can't see that he's ever been prescribed Ropinirole in the past.  I also don't see any documentation in ED note chart as to why he was placed on hyoscamine.  He'll need another visit to address this.  Will see him on Monday.

## 2012-06-19 ENCOUNTER — Encounter: Payer: Self-pay | Admitting: Family Medicine

## 2012-06-19 ENCOUNTER — Ambulatory Visit (INDEPENDENT_AMBULATORY_CARE_PROVIDER_SITE_OTHER): Payer: Managed Care, Other (non HMO) | Admitting: Family Medicine

## 2012-06-19 VITALS — BP 178/132 | HR 89 | Temp 98.2°F | Ht 73.0 in | Wt >= 6400 oz

## 2012-06-19 DIAGNOSIS — R739 Hyperglycemia, unspecified: Secondary | ICD-10-CM

## 2012-06-19 DIAGNOSIS — L83 Acanthosis nigricans: Secondary | ICD-10-CM

## 2012-06-19 DIAGNOSIS — Z23 Encounter for immunization: Secondary | ICD-10-CM

## 2012-06-19 DIAGNOSIS — R7309 Other abnormal glucose: Secondary | ICD-10-CM

## 2012-06-19 DIAGNOSIS — R079 Chest pain, unspecified: Secondary | ICD-10-CM

## 2012-06-19 DIAGNOSIS — I10 Essential (primary) hypertension: Secondary | ICD-10-CM

## 2012-06-19 DIAGNOSIS — G4733 Obstructive sleep apnea (adult) (pediatric): Secondary | ICD-10-CM

## 2012-06-19 MED ORDER — NITROGLYCERIN ER 2.5 MG PO CPCR: 2.5000 mg | ORAL_CAPSULE | Freq: Three times a day (TID) | ORAL | Status: DC

## 2012-06-19 NOTE — Progress Notes (Signed)
Patient ID: Leonard Lewis, male   DOB: 1981/05/27, 31 y.o.   MRN: [id number] Leonard Lewis is a 31 y.o. male who presents to [facility] today for follow-up for chronic HTN:  1.  Hypertension:  Long-term problem for this patient.  Takes meds "5 out of 7 days" on average.  No adverse effects from medication.  Not checking it regularly.  No HA, dizziness,  palpitations, or LE swelling.   BP Readings from Last 3 Encounters:  06/19/12 178/132  06/03/12 153/80  03/28/12 211/138   2.  Chest pain:  Has had three episodes of chest pain in past 2 weeks.  These have all occurred while walking at [facility] after he has been walking for some distance. Describes pain as "tightness" in his chest along his ribs.  Becomes sweaty and somewhat nauseous at same time.  With the last episode, he also became mildly dyspneic.  With all 3 episodes, resolution of symptoms occurred within 10 minutes of sitting down and resting. Morbidly obese, not getting any exercise.  Has had Echo in 2008 which was read as 40 - 50% EF at that time.    3.  Sleep apnea:  Has CPAP at home.  Never uses it, hates using it.  States he never awakens from sleep well rested.  Averages about 1 episode of night of waking from sleep with apneic episode at night.      The following portions of the patient's history were reviewed and updated as appropriate: allergies, current medications, past medical history, family and social history, and problem list.  Patient is a nonsmoker.  Past Medical History  Diagnosis Date  . CHF (congestive heart failure) 2008    EF 40-50%  . Obstructive sleep apnea   . Obesity, morbid   . Hypertension     ROS as above otherwise neg. No fever, Chills, Abd pain.  Medications reviewed. Current Outpatient Prescriptions  Medication Sig Dispense Refill  . amLODipine (NORVASC) 10 MG tablet Take 1 tablet (10 mg total) by mouth daily.  90 tablet  3  . aspirin EC 81 MG tablet Take 81 mg by mouth daily.      . carvedilol (COREG  CR) 80 MG 24 hr capsule Take 1 capsule (80 mg total) by mouth daily.  90 capsule  3  . lisinopril (PRINIVIL,ZESTRIL) 40 MG tablet Take 40 mg by mouth daily.      Marland Kitchen lisinopril-hydrochlorothiazide (PRINZIDE,ZESTORETIC) 20-25 MG per tablet TAKE 1 TABLET BY MOUTH DAILY.  90 tablet  1    Exam:  BP 178/132  Pulse 89  Temp 98.2 F (36.8 C) (Oral)  Ht 6\' 1"  (1.854 m)  Wt 434 lb 6.4 oz (197.043 kg)  BMI 57.31 kg/m2  SpO2 100% Gen: Well NAD. Obese.   HEENT: EOMI.  PERRL.  Fundoscopy revealed no flame hemorrhages, normal cup to disc ratio.  MMM Lungs: CTABL Nl WOB Cardiac:  Regular rate and rhythm without murmur auscultated.  Good S1/S2. Abd: NABS, NT, ND Exts: Non edematous BL  LE, warm and well perfused.   No results found for this or any previous visit (from the past 72 hour(s)).

## 2012-06-19 NOTE — Patient Instructions (Signed)
I am going to refer you to cardiology for your chest pain.  Someone will call you about the appointment.    When you feel the pain, take the nitroglycerin like we talked about.  Slip 1 tablet under your tongue.  You can repeat this every 5 minutes for a total of 3 tablets.    If the pain does not go away, gets worse, or you are worried, come back or go to the ED immediately.    Work on using the CPAP, I think it will really help things.    Nitroglycerin sublingual tablets What is this medicine? NITROGLYCERIN (nye troe GLI ser in) is a type of vasodilator. It relaxes blood vessels, increasing the blood and oxygen supply to your heart. This medicine is used to relieve chest pain caused by angina. It is also used to prevent chest pain before activities like climbing stairs, going outdoors in cold weather, or sexual activity. This medicine may be used for other purposes; ask your health care provider or pharmacist if you have questions. What should I tell my health care provider before I take this medicine? They need to know if you have any of these conditions: -anemia -head injury, recent stroke, or bleeding in the brain -liver disease -previous heart attack -an unusual or allergic reaction to nitroglycerin, other medicines, foods, dyes, or preservatives -pregnant or trying to get pregnant -breast-feeding How should I use this medicine? Take this medicine by mouth as needed. At the first sign of an angina attack (chest pain or tightness) place one tablet under your tongue. You can also take this medicine 5 to 10 minutes before an event likely to produce chest pain. Follow the directions on the prescription label. Let the tablet dissolve under the tongue. Do not swallow whole. Replace the dose if you accidentally swallow it. It will help if your mouth is not dry. Saliva around the tablet will help it to dissolve more quickly. Do not eat or drink, smoke or chew tobacco while a tablet is dissolving.  If you are not better within 5 minutes after taking ONE dose of nitroglycerin, call 9-1-1 immediately to seek emergency medical care. Do not take more than 3 nitroglycerin tablets over 15 minutes. If you take this medicine often to relieve symptoms of angina, your doctor or health care professional may provide you with different instructions to manage your symptoms. If symptoms do not go away after following these instructions, it is important to call 9-1-1 immediately. Do not take more than 3 nitroglycerin tablets over 15 minutes. Talk to your pediatrician regarding the use of this medicine in children. Special care may be needed. Overdosage: If you think you have taken too much of this medicine contact a poison control center or emergency room at once. NOTE: This medicine is only for you. Do not share this medicine with others. What if I miss a dose? This does not apply. This medicine is only used as needed. What may interact with this medicine? Do not take this medicine with any of the following medications: -certain migraine medicines like ergotamine and dihydroergotamine (DHE) -medicines used to treat erectile dysfunction like sildenafil, tadalafil, and vardenafil This medicine may also interact with the following medications: -alteplase -aspirin -heparin -medicines for high blood pressure -medicines for mental depression -other medicines used to treat angina -phenothiazines like chlorpromazine, mesoridazine, prochlorperazine, thioridazine This list may not describe all possible interactions. Give your health care provider a list of all the medicines, herbs, non-prescription drugs, or dietary  supplements you use. Also tell them if you smoke, drink alcohol, or use illegal drugs. Some items may interact with your medicine. What should I watch for while using this medicine? Tell your doctor or health care professional if you feel your medicine is no longer working. Keep this medicine with you  at all times. Sit or lie down when you take your medicine to prevent falling if you feel dizzy or faint after using it. Try to remain calm. This will help you to feel better faster. If you feel dizzy, take several deep breaths and lie down with your feet propped up, or bend forward with your head resting between your knees. You may get drowsy or dizzy. Do not drive, use machinery, or do anything that needs mental alertness until you know how this drug affects you. Do not stand or sit up quickly, especially if you are an older patient. This reduces the risk of dizzy or fainting spells. Alcohol can make you more drowsy and dizzy. Avoid alcoholic drinks. Do not treat yourself for coughs, colds, or pain while you are taking this medicine without asking your doctor or health care professional for advice. Some ingredients may increase your blood pressure. What side effects may I notice from receiving this medicine? Side effects that you should report to your doctor or health care professional as soon as possible: -blurred vision -dry mouth -skin rash -sweating -the feeling of extreme pressure in the head -unusually weak or tired Side effects that usually do not require medical attention (report to your doctor or health care professional if they continue or are bothersome): -flushing of the face or neck -headache -irregular heartbeat, palpitations -nausea, vomiting This list may not describe all possible side effects. Call your doctor for medical advice about side effects. You may report side effects to FDA at 1-800-FDA-1088. Where should I keep my medicine? Keep out of the reach of children. Store at room temperature between 20 and 25 degrees C (68 and 77 degrees F). Store in Retail buyer. Protect from light and moisture. Keep tightly closed. Throw away any unused medicine after the expiration date. NOTE: This sheet is a summary. It may not cover all possible information. If you have questions  about this medicine, talk to your doctor, pharmacist, or health care provider.  2012, [employer]vier/[employer] Standard. (04/12/2008 5:16:24 PM)

## 2012-06-20 DIAGNOSIS — R079 Chest pain, unspecified: Secondary | ICD-10-CM | POA: Insufficient documentation

## 2012-06-20 NOTE — Assessment & Plan Note (Signed)
Repeated A1C.   Borderline diabetic.  Discussed this with him as well.

## 2012-06-20 NOTE — Assessment & Plan Note (Signed)
Long discussion with patient regarding need for CPAP to control his sleep apnea and thus help with controlling his hypertension.  Patient states he is somewhat more open to using his CPAP after discussion. Will attempt using it.

## 2012-06-20 NOTE — Assessment & Plan Note (Signed)
Concerning for cardiac pain. Known decreased systolic function and long-standing hypertension, so concern is that he is exhibiting end-stage problems from longstanding hypertension.   Provided patient with NTG and also provided red flags that would prompt return here or ED, both written and verbally.   Urgent referral to cardiology for possible stress test and further recommendations.

## 2012-06-20 NOTE — Assessment & Plan Note (Signed)
See HTN discussion.   ?

## 2012-07-04 HISTORY — PX: CARDIAC CATHETERIZATION: SHX172

## 2012-07-06 ENCOUNTER — Ambulatory Visit (INDEPENDENT_AMBULATORY_CARE_PROVIDER_SITE_OTHER): Payer: Managed Care, Other (non HMO) | Admitting: Cardiovascular Disease

## 2012-07-06 ENCOUNTER — Encounter: Payer: Self-pay | Admitting: Cardiovascular Disease

## 2012-07-06 VITALS — BP 147/102 | HR 91 | Ht 73.0 in | Wt >= 6400 oz

## 2012-07-06 DIAGNOSIS — I10 Essential (primary) hypertension: Secondary | ICD-10-CM

## 2012-07-06 DIAGNOSIS — E785 Hyperlipidemia, unspecified: Secondary | ICD-10-CM

## 2012-07-06 DIAGNOSIS — E669 Obesity, unspecified: Secondary | ICD-10-CM

## 2012-07-06 DIAGNOSIS — G4733 Obstructive sleep apnea (adult) (pediatric): Secondary | ICD-10-CM

## 2012-07-06 DIAGNOSIS — R079 Chest pain, unspecified: Secondary | ICD-10-CM

## 2012-07-06 NOTE — Assessment & Plan Note (Signed)
Atypical.  Will be difficult to do stress test on him due to size.  [place] can do two day protocol with lexiscan.  Will try to do this noninvasively as he needs some sort of risk stratification

## 2012-07-06 NOTE — Assessment & Plan Note (Signed)
Well controlled.  Continue current medications and low sodium Dash type diet.    

## 2012-07-06 NOTE — Assessment & Plan Note (Signed)
Cholesterol is at goal.  Continue current dose of statin and diet Rx.  No myalgias or side effects.  F/U  LFT's in 6 months. Lab Results  Component Value Date   LDLCALC 120* 11/25/2008

## 2012-07-06 NOTE — Assessment & Plan Note (Signed)
Explained the importance of wearing mask to control HTN and prevent afib

## 2012-07-06 NOTE — Patient Instructions (Signed)
Your physician recommends that you schedule a follow-up appointment in: AS NEEDED Your physician recommends that you continue on your current medications as directed. Please refer to the Current Medication list given to you today. Your physician has requested that you have a lexiscan myoview. For further information please visit [email or web address] Please follow instruction sheet, as given.  WT  [address]  DX CHEST PAIN   AT [facility]

## 2012-07-06 NOTE — Progress Notes (Signed)
Patient ID: Leonard Lewis, male   DOB: 03/08/1981, 30 y.o.   MRN: [id number] 30 yo with HTN and Chest pain  Patient is morbidly obese.  He eats horribly on the road with a lot of cabs and large portions.  Fixes ATM;s.  He is sedentary and lives with gradnmother.  Had an aunt who apparently died after bariatric surgery and he has not entertained this.  Currently weighs 440 lbs.  Has sleep apnea but does not where CPAP  1. Hypertension: Long-term problem for this patient. Takes meds "5 out of 7 days" on average. No adverse effects from medication. Not checking it regularly. No HA, dizziness, palpitations, or LE swelling.   2. Chest pain: Has had three episodes of chest pain in past 2 weeks. These have all occurred while walking at [employer] after he has been walking for some distance. Describes pain as "tightness" in his chest along his ribs. Becomes sweaty and somewhat nauseous at same time. With the last episode, he also became mildly dyspneic. With all 3 episodes, resolution of symptoms occurred within 10 minutes of sitting down and resting. Morbidly obese, not getting any exercise. Has had Echo in 2008 which was read as 40 - 50% EF at that time.   ROS: Denies fever, malais, weight loss, blurry vision, decreased visual acuity, cough, sputum, SOB, hemoptysis, pleuritic pain, palpitaitons, heartburn, abdominal pain, melena, lower extremity edema, claudication, or rash.  All other systems reviewed and negative   General: Affect appropriate Obese black male HEENT: normal Neck supple with no adenopathy JVP normal no bruits no thyromegaly Lungs clear with no wheezing and good diaphragmatic motion Heart:  S1/S2 no murmur,rub, gallop or click PMI normal Abdomen: benighn, BS positve, no tenderness, no AAA no bruit.  No HSM or HJR Distal pulses intact with no bruits No edema Neuro non-focal Skin warm and dry No muscular weakness  Medications Current Outpatient Prescriptions  Medication Sig  Dispense Refill  . amLODipine (NORVASC) 10 MG tablet Take 1 tablet (10 mg total) by mouth daily.  90 tablet  3  . aspirin EC 81 MG tablet Take 81 mg by mouth daily.      . carvedilol (COREG CR) 80 MG 24 hr capsule Take 1 capsule (80 mg total) by mouth daily.  90 capsule  3  . lisinopril-hydrochlorothiazide (PRINZIDE,ZESTORETIC) 20-25 MG per tablet TAKE 1 TABLET BY MOUTH DAILY.  90 tablet  1  . nitroGLYCERIN 2.5 MG CR capsule Take 1 capsule (2.5 mg total) by mouth 3 (three) times daily.  30 capsule  0    Allergies Review of patient's allergies indicates no known allergies.  Family History: Family History  Problem Relation Age of Onset  . Hypertension Mother   . Hypertension Father     Social History: History   Social History  . Marital Status: Single    Spouse Name: N/A    Number of Children: N/A  . Years of Education: N/A   Occupational History  . Not on file.   Social History Main Topics  . Smoking status: Former Smoker    Types: Cigarettes  . Smokeless tobacco: Not on file  . Alcohol Use: No  . Drug Use: No  . Sexually Active: Not on file   Other Topics Concern  . Not on file   Social History Narrative  . No narrative on file    Electrocardiogram:  8/31  SR rate 96  Poor R wave progression.    Assessment and Plan    

## 2012-07-06 NOTE — Assessment & Plan Note (Signed)
Discussed bariatric surgery with patient  Leonard Lewis he does not appear to want to take any responsibility for his own health.  Noncompliant with CPAP and BP meds.  No discipline with diet.  Seems to find excuses for all his poor health decisions

## 2012-07-17 ENCOUNTER — Ambulatory Visit: Payer: Self-pay | Admitting: Cardiovascular Disease

## 2012-07-17 DIAGNOSIS — R079 Chest pain, unspecified: Secondary | ICD-10-CM

## 2012-07-31 ENCOUNTER — Encounter: Payer: Self-pay | Admitting: *Deleted

## 2012-07-31 ENCOUNTER — Telehealth: Payer: Self-pay | Admitting: *Deleted

## 2012-07-31 ENCOUNTER — Encounter (HOSPITAL_COMMUNITY): Payer: Self-pay | Admitting: Pharmacy Technician

## 2012-07-31 DIAGNOSIS — R9439 Abnormal result of other cardiovascular function study: Secondary | ICD-10-CM

## 2012-07-31 DIAGNOSIS — Z01810 Encounter for preprocedural cardiovascular examination: Secondary | ICD-10-CM

## 2012-07-31 NOTE — Telephone Encounter (Signed)
Follow-up:    Patient returned your call.  Please call back. 

## 2012-07-31 NOTE — Telephone Encounter (Signed)
[facility] scheduled for 08/03/12 7:30 case. Labs set for 08/01/12, pt informed of abnormal stress test and agrees to proceed with testing. I will forward to Dr Excell Seltzer to write orders since Dr Eden Emms is out of office this week.

## 2012-07-31 NOTE — Telephone Encounter (Signed)
Message copied by Freddi Starr on Mon Jul 31, 2012 10:06 AM ------      Message from: Wendall Stade      Created: Fri Jul 28, 2012  3:48 PM      Regarding: FW: abnormal stress test       Needs cath.  He is large  ? Lab 6  Sent message to [place] to see if they would cath him next week as I am on vacation.  Please let patient know.  I didn't get nuclear results until today as it was done in [place] and Muhamid just called me today.            ----- Message -----         From: Iran Ouch, MD         Sent: 07/28/2012   1:12 PM           To: Wendall Stade, MD      Subject: RE: abnormal stress test                                 Moderate size inferior/inferolateral defect mostly fixed with mild reversibility in lateral segment. Likely scar with mild periinfarct ischemia.       Small localized basal anterior wall reversible defect. EF: 34%.             ----- Message -----         From: Wendall Stade, MD         Sent: 07/28/2012  12:56 PM           To: Iran Ouch, MD      Subject: RE: abnormal stress test                                 Didn't get message ?? What was his nuclear study at [place] look like ??      ----- Message -----         From: Iran Ouch, MD         Sent: 07/28/2012  12:33 PM           To: Wendall Stade, MD      Subject: abnormal stress test                                     This is the patient I left a message about.       It looks that he had a nuclear stress test at [facility] in 2008 with similar findings.

## 2012-07-31 NOTE — Telephone Encounter (Signed)
msg left to call back and ask for me.

## 2012-07-31 NOTE — Telephone Encounter (Signed)
msg left on cell to call me back regarding test results, number provided and asked him to ask for me, name provided. I called his home and family member said it was best to leave msg on cell, pt doesn't come home till after 7pm. I will wait to hear back.

## 2012-08-01 ENCOUNTER — Ambulatory Visit (INDEPENDENT_AMBULATORY_CARE_PROVIDER_SITE_OTHER): Payer: Managed Care, Other (non HMO) | Admitting: *Deleted

## 2012-08-01 DIAGNOSIS — E785 Hyperlipidemia, unspecified: Secondary | ICD-10-CM

## 2012-08-01 DIAGNOSIS — G4733 Obstructive sleep apnea (adult) (pediatric): Secondary | ICD-10-CM

## 2012-08-01 DIAGNOSIS — I509 Heart failure, unspecified: Secondary | ICD-10-CM

## 2012-08-01 DIAGNOSIS — E669 Obesity, unspecified: Secondary | ICD-10-CM

## 2012-08-01 DIAGNOSIS — I10 Essential (primary) hypertension: Secondary | ICD-10-CM

## 2012-08-01 DIAGNOSIS — R739 Hyperglycemia, unspecified: Secondary | ICD-10-CM

## 2012-08-01 DIAGNOSIS — R7309 Other abnormal glucose: Secondary | ICD-10-CM

## 2012-08-01 LAB — PROTIME-INR
INR: 1.1 ratio — ABNORMAL HIGH (ref 0.8–1.0)
Prothrombin Time: 11.8 s (ref 10.2–12.4)

## 2012-08-01 LAB — BASIC METABOLIC PANEL
BUN: 14 mg/dL (ref 6–23)
GFR: 65.28 mL/min (ref >60.00)
Potassium: 3.6 mEq/L (ref 3.5–5.1)

## 2012-08-01 LAB — CBC WITH DIFFERENTIAL/PLATELET
Eosinophils Relative: 2.2 % (ref 0.0–5.0)
HCT: 39.8 % (ref 39.0–52.0)
Lymphocytes Relative: 39.9 % (ref 12.0–46.0)
Monocytes Relative: 11 % (ref 3.0–12.0)
Neutrophils Relative %: 46.3 % (ref 43.0–77.0)
Platelets: 319 10*3/uL (ref 150.0–400.0)
WBC: 10 10*3/uL (ref 4.5–10.5)

## 2012-08-02 ENCOUNTER — Other Ambulatory Visit: Payer: Self-pay | Admitting: Cardiovascular Disease

## 2012-08-03 ENCOUNTER — Ambulatory Visit (HOSPITAL_COMMUNITY)
Admission: RE | Admit: 2012-08-03 | Discharge: 2012-08-03 | Disposition: A | Discharge status: Home / Self Care | Payer: Managed Care, Other (non HMO) | Source: Ambulatory Visit | Attending: Cardiovascular Disease | Admitting: Cardiovascular Disease

## 2012-08-03 ENCOUNTER — Encounter (HOSPITAL_COMMUNITY)
Admission: RE | Disposition: A | Discharge status: Home / Self Care | Payer: Self-pay | Source: Ambulatory Visit | Attending: Cardiovascular Disease

## 2012-08-03 DIAGNOSIS — R9439 Abnormal result of other cardiovascular function study: Secondary | ICD-10-CM | POA: Insufficient documentation

## 2012-08-03 DIAGNOSIS — I10 Essential (primary) hypertension: Secondary | ICD-10-CM | POA: Insufficient documentation

## 2012-08-03 DIAGNOSIS — R079 Chest pain, unspecified: Secondary | ICD-10-CM

## 2012-08-03 HISTORY — PX: LEFT HEART CATHETERIZATION WITH CORONARY ANGIOGRAM: SHX5451

## 2012-08-03 SURGERY — LEFT HEART CATHETERIZATION WITH CORONARY ANGIOGRAM
Anesthesia: LOCAL

## 2012-08-03 MED ORDER — FENTANYL CITRATE 0.05 MG/ML IJ SOLN
INTRAMUSCULAR | Status: AC
  Filled 2012-08-03: qty 2

## 2012-08-03 MED ORDER — SODIUM CHLORIDE 0.9 % IV SOLN
INTRAVENOUS | Status: DC
  Administered 2012-08-03: 07:00:00 via INTRAVENOUS

## 2012-08-03 MED ORDER — HEPARIN SODIUM (PORCINE) 1000 UNIT/ML IJ SOLN
INTRAMUSCULAR | Status: AC
  Filled 2012-08-03: qty 1

## 2012-08-03 MED ORDER — ONDANSETRON HCL 4 MG/2ML IJ SOLN: 4.0000 mg | Freq: Four times a day (QID) | INTRAMUSCULAR | Status: DC | PRN

## 2012-08-03 MED ORDER — SODIUM CHLORIDE 0.9 % IV SOLN: 250.0000 mL | INTRAVENOUS | Status: DC | PRN

## 2012-08-03 MED ORDER — ASPIRIN 81 MG PO CHEW
CHEWABLE_TABLET | ORAL | Status: AC
  Filled 2012-08-03: qty 3

## 2012-08-03 MED ORDER — VERAPAMIL HCL 2.5 MG/ML IV SOLN
INTRAVENOUS | Status: AC
  Filled 2012-08-03: qty 2

## 2012-08-03 MED ORDER — ACETAMINOPHEN 325 MG PO TABS: 650.0000 mg | ORAL_TABLET | ORAL | Status: DC | PRN

## 2012-08-03 MED ORDER — LISINOPRIL 20 MG PO TABS
20.0000 mg | ORAL_TABLET | Freq: Once | ORAL | Status: AC
  Administered 2012-08-03: 20 mg via ORAL
  Filled 2012-08-03: qty 1

## 2012-08-03 MED ORDER — DIAZEPAM 5 MG PO TABS
ORAL_TABLET | ORAL | Status: AC
  Filled 2012-08-03: qty 1

## 2012-08-03 MED ORDER — ASPIRIN 81 MG PO CHEW
324.0000 mg | CHEWABLE_TABLET | ORAL | Status: AC
  Administered 2012-08-03: 243 mg via ORAL

## 2012-08-03 MED ORDER — HYDRALAZINE HCL 20 MG/ML IJ SOLN
20.0000 mg | Freq: Once | INTRAMUSCULAR | Status: AC
  Administered 2012-08-03: 20 mg via INTRAVENOUS

## 2012-08-03 MED ORDER — HYDROCHLOROTHIAZIDE 25 MG PO TABS
25.0000 mg | ORAL_TABLET | Freq: Once | ORAL | Status: AC
  Administered 2012-08-03: 25 mg via ORAL
  Filled 2012-08-03: qty 1

## 2012-08-03 MED ORDER — AMLODIPINE BESYLATE 5 MG PO TABS
ORAL_TABLET | ORAL | Status: AC
  Filled 2012-08-03: qty 2

## 2012-08-03 MED ORDER — LABETALOL HCL 5 MG/ML IV SOLN
INTRAVENOUS | Status: AC
  Filled 2012-08-03: qty 4

## 2012-08-03 MED ORDER — SODIUM CHLORIDE 0.9 % IJ SOLN: 3.0000 mL | Freq: Two times a day (BID) | INTRAMUSCULAR | Status: DC

## 2012-08-03 MED ORDER — HYDRALAZINE HCL 20 MG/ML IJ SOLN
INTRAMUSCULAR | Status: AC
  Filled 2012-08-03: qty 1

## 2012-08-03 MED ORDER — SODIUM CHLORIDE 0.9 % IJ SOLN: 3.0000 mL | INTRAMUSCULAR | Status: DC | PRN

## 2012-08-03 MED ORDER — CARVEDILOL PHOSPHATE ER 80 MG PO CP24: 80.0000 mg | ORAL_CAPSULE | Freq: Once | ORAL | Status: DC

## 2012-08-03 MED ORDER — SODIUM CHLORIDE 0.9 % IV SOLN: INTRAVENOUS | Status: DC

## 2012-08-03 MED ORDER — MIDAZOLAM HCL 2 MG/2ML IJ SOLN
INTRAMUSCULAR | Status: AC
  Filled 2012-08-03: qty 2

## 2012-08-03 MED ORDER — AMLODIPINE BESYLATE 10 MG PO TABS
10.0000 mg | ORAL_TABLET | Freq: Once | ORAL | Status: AC
  Administered 2012-08-03: 10 mg via ORAL
  Filled 2012-08-03: qty 1

## 2012-08-03 MED ORDER — CARVEDILOL PHOSPHATE ER 20 MG PO CP24
80.0000 mg | ORAL_CAPSULE | Freq: Once | ORAL | Status: AC
  Administered 2012-08-03: 80 mg via ORAL
  Filled 2012-08-03: qty 4

## 2012-08-03 MED ORDER — DIAZEPAM 5 MG PO TABS
5.0000 mg | ORAL_TABLET | ORAL | Status: AC
  Administered 2012-08-03: 5 mg via ORAL

## 2012-08-03 NOTE — H&P (View-Only) (Signed)
Patient ID: Leonard Lewis, male   DOB: [birth year], 31 y.o.   MRN: [id number] 31 yo with HTN and Chest pain  Patient is morbidly obese.  He eats horribly on the road with a lot of cabs and large portions.  Fixes ATM;s.  He is sedentary and lives with gradnmother.  Had an aunt who apparently died after bariatric surgery and he has not entertained this.  Currently weighs 440 lbs.  Has sleep apnea but does not where CPAP  1. Hypertension: Long-term problem for this patient. Takes meds "5 out of 7 days" on average. No adverse effects from medication. Not checking it regularly. No HA, dizziness, palpitations, or LE swelling.   2. Chest pain: Has had three episodes of chest pain in past 2 weeks. These have all occurred while walking at [facility] after he has been walking for some distance. Describes pain as "tightness" in his chest along his ribs. Becomes sweaty and somewhat nauseous at same time. With the last episode, he also became mildly dyspneic. With all 3 episodes, resolution of symptoms occurred within 10 minutes of sitting down and resting. Morbidly obese, not getting any exercise. Has had Echo in 2008 which was read as 40 - 50% EF at that time.   ROS: Denies fever, malais, weight loss, blurry vision, decreased visual acuity, cough, sputum, SOB, hemoptysis, pleuritic pain, palpitaitons, heartburn, abdominal pain, melena, lower extremity edema, claudication, or rash.  All other systems reviewed and negative   General: Affect appropriate Obese black male HEENT: normal Neck supple with no adenopathy JVP normal no bruits no thyromegaly Lungs clear with no wheezing and good diaphragmatic motion Heart:  S1/S2 no murmur,rub, gallop or click PMI normal Abdomen: benighn, BS positve, no tenderness, no AAA no bruit.  No HSM or HJR Distal pulses intact with no bruits No edema Neuro non-focal Skin warm and dry No muscular weakness  Medications Current Outpatient Prescriptions  Medication Sig  Dispense Refill  . amLODipine (NORVASC) 10 MG tablet Take 1 tablet (10 mg total) by mouth daily.  90 tablet  3  . aspirin EC 81 MG tablet Take 81 mg by mouth daily.      . carvedilol (COREG CR) 80 MG 24 hr capsule Take 1 capsule (80 mg total) by mouth daily.  90 capsule  3  . lisinopril-hydrochlorothiazide (PRINZIDE,ZESTORETIC) 20-25 MG per tablet TAKE 1 TABLET BY MOUTH DAILY.  90 tablet  1  . nitroGLYCERIN 2.5 MG CR capsule Take 1 capsule (2.5 mg total) by mouth 3 (three) times daily.  30 capsule  0    Allergies Review of patient's allergies indicates no known allergies.  Family History: Family History  Problem Relation Age of Onset  . Hypertension Mother   . Hypertension Father     Social History: History   Social History  . Marital Status: Single    Spouse Name: N/A    Number of Children: N/A  . Years of Education: N/A   Occupational History  . Not on file.   Social History Main Topics  . Smoking status: Former Smoker    Types: Cigarettes  . Smokeless tobacco: Not on file  . Alcohol Use: No  . Drug Use: No  . Sexually Active: Not on file   Other Topics Concern  . Not on file   Social History Narrative  . No narrative on file    Electrocardiogram:  8/31  SR rate 96  Poor R wave progression.    Assessment and Plan

## 2012-08-03 NOTE — CV Procedure (Signed)
   Cardiac Catheterization Procedure Note  Name: Leonard Lewis MRN: [id number] DOB: 07/10/1981  Procedure: Right radial artery access with ultrasound guidance, Left Heart Cath, Selective Coronary Angiography, LV angiography  Indication: Chest pain with abnormal Myoview scan   Procedural Details: The right wrist was prepped, draped, and anesthetized with 1% lidocaine. Using the modified Seldinger technique, a 5 French sheath was introduced into the right radial artery. The radial artery was difficult to cannulate. I used ultrasounded guided access. Needle entry into the artery was directly visualized. 3 mg of verapamil was administered through the sheath, weight-based unfractionated heparin was administered intravenously. Standard Judkins catheters were used for selective coronary angiography and left ventriculography. Catheter exchanges were performed over an exchange length guidewire. There were no immediate procedural complications. A TR band was used for radial hemostasis at the completion of the procedure.  The patient was transferred to the post catheterization recovery area for further monitoring.  Procedural Findings: Hemodynamics: AO 182/125 LV 173/36  Coronary angiography: Coronary dominance: right  Left mainstem: Large vessel, widely patent.  Left anterior descending (LAD): The LAD is widely patent to the left ventricular apex. There is a large first diagonal present. There is no obstructive disease throughout the course of the LAD or its branch vessels.  Left circumflex (LCx): The left circumflex is widely patent. There is a large intermediate branch with no stenosis. The obtuse marginal branches are small  Right coronary artery (RCA): Dominant vessel. The proximal RCA is dilated without significant obstructive disease. After the first RV marginal branch the vessel tapers and there is minimal nonobstructive disease throughout the mid vessel. The PDA and posterolateral branches  are patent.  Left ventriculography: Left ventricular systolic function is low-normal. There is hypokinesis of the basal inferior wall. LVEF is estimated at 50-55%, there is no significant mitral regurgitation   Final Conclusions:   1. Widely patent coronary arteries without significant obstructive CAD 2. Mild segmental left ventricular systolic dysfunction with overall preserved  LVEF. 3. Elevated left ventricular end-diastolic pressure and severe systemic hypertension consistent with hypertensive heart disease  Recommendations: Aggressive antihypertensive therapy and lifestyle modification. Suspect perfusion defects related to patient's body habitus.  Tonny Bollman 08/03/2012, 8:43 AM

## 2012-08-03 NOTE — Interval H&P Note (Signed)
History and Physical Interval Note:  08/03/2012 7:38 AM  Leonard Lewis  has presented today for surgery, with the diagnosis of Chest pain  The various methods of treatment have been discussed with the patient and family. After consideration of risks, benefits and other options for treatment, the patient has consented to  Procedure(s) (LRB) with comments: LEFT HEART CATHETERIZATION WITH CORONARY ANGIOGRAM (N/A) as a surgical intervention .  The patient's history has been reviewed, patient examined, no change in status, stable for surgery.  I have reviewed the patient's chart and labs.  Questions were answered to the patient's satisfaction.    Stress test showed low LVEF and mutiple perfusion defects. For cath and possible PCI today.   Tonny Bollman

## 2012-08-17 ENCOUNTER — Emergency Department (HOSPITAL_COMMUNITY)
Admission: EM | Admit: 2012-08-17 | Discharge: 2012-08-17 | Disposition: A | Discharge status: Home / Self Care | Payer: Managed Care, Other (non HMO) | Source: Home / Self Care | Attending: Family Medicine | Admitting: Family Medicine

## 2012-08-17 ENCOUNTER — Encounter (HOSPITAL_COMMUNITY): Payer: Self-pay | Admitting: Emergency Medicine

## 2012-08-17 DIAGNOSIS — M7661 Achilles tendinitis, right leg: Secondary | ICD-10-CM

## 2012-08-17 DIAGNOSIS — Z760 Encounter for issue of repeat prescription: Secondary | ICD-10-CM

## 2012-08-17 DIAGNOSIS — M7660 Achilles tendinitis, unspecified leg: Secondary | ICD-10-CM

## 2012-08-17 DIAGNOSIS — I10 Essential (primary) hypertension: Secondary | ICD-10-CM

## 2012-08-17 MED ORDER — HYDROCODONE-ACETAMINOPHEN 5-500 MG PO TABS: 1.0000 | ORAL_TABLET | Freq: Three times a day (TID) | ORAL | Status: DC | PRN

## 2012-08-17 MED ORDER — CLONIDINE HCL 0.1 MG PO TABS
ORAL_TABLET | ORAL | Status: AC
  Filled 2012-08-17: qty 2

## 2012-08-17 MED ORDER — CLONIDINE HCL 0.1 MG PO TABS
0.2000 mg | ORAL_TABLET | Freq: Once | ORAL | Status: AC
  Administered 2012-08-17: 0.2 mg via ORAL

## 2012-08-17 MED ORDER — TRAMADOL HCL 50 MG PO TABS: 50.0000 mg | ORAL_TABLET | Freq: Four times a day (QID) | ORAL | Status: DC | PRN

## 2012-08-17 MED ORDER — AMLODIPINE BESYLATE 10 MG PO TABS: 10.0000 mg | ORAL_TABLET | Freq: Every day | ORAL | Status: DC

## 2012-08-17 MED ORDER — CLONIDINE HCL 0.1 MG PO TABS
ORAL_TABLET | ORAL | Status: AC
  Filled 2012-08-17: qty 1

## 2012-08-17 NOTE — ED Provider Notes (Signed)
History     CSN: [id number]  Arrival date & time 08/17/12  0940   First MD Initiated Contact with Patient 08/17/12 1000      Chief Complaint  Patient presents with  . Ankle Pain    right ankle pain since yesterday sudden on set denies injury.    (Consider location/radiation/quality/duration/timing/severity/associated sxs/prior treatment) HPI Comments: 31 year old male former smoker with history of hypertension and morbid obesity. Here complaining of pain posterior to his right ankle with ankle movement since yesterday. 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 Patient also reports that his working boots are very tight at the tender area. He's currently wearing basketball sneaker tall shoes that are more comfortable. Reports pain more with flexion of his ankle than with extension. Denies increased temperature, swelling or redness. Denies any known direct injury or recent falls. Does not remember twisting his ankle.  Also patient found incidentally hypertensive today. He reports that he just took his blood pressure medications few minutes ago but also states that he has been out of his amlodipine for several days. He recently had a cardiac catheterization (10/31) without findings consistent with coronary obstruction. Denies chest pain, shortness of breath, headache, visual changes, balance or gait problems, low extremity numbness, weakness or paresthesias.   Past Medical History  Diagnosis Date  . Obstructive sleep apnea   . Obesity, morbid   . Hypertension   . CHF (congestive heart failure) 2008    EF 40-50%    History reviewed. No pertinent past surgical history.  Family History  Problem Relation Age of Onset  . Hypertension Mother   . Hypertension Father     History  Substance Use Topics  . Smoking status: Former Smoker    Types: Cigarettes  . Smokeless tobacco: Not on file  . Alcohol Use: No        Review of Systems  Constitutional: Negative for fever, diaphoresis and fatigue.  Respiratory: Negative for cough, chest tightness and shortness of breath.   Cardiovascular: Negative for chest pain, palpitations and leg swelling.  Gastrointestinal: Negative for nausea, vomiting and abdominal pain.  Musculoskeletal:       Right ankle pain as per HPI  Skin: Negative for rash.  Neurological: Negative for dizziness, tremors, seizures, syncope, facial asymmetry, speech difficulty, weakness, light-headedness, numbness and headaches.  All other systems reviewed and are negative.    Allergies  Review of patient's allergies indicates no known allergies.  Home Medications   Current Outpatient Rx  Name  Route  Sig  Dispense  Refill  . AMLODIPINE BESYLATE 10 MG PO TABS   Oral   Take 1 tablet (10 mg total) by mouth daily.   90 tablet   3   . ASPIRIN EC 81 MG PO TBEC   Oral   Take 81 mg by mouth daily.         Marland Kitchen CARVEDILOL PHOSPHATE ER 80 MG PO CP24   Oral   Take 1 capsule (80 mg total) by mouth daily.   90 capsule   3   . LISINOPRIL-HYDROCHLOROTHIAZIDE 20-25 MG PO TABS      TAKE 1 TABLET BY MOUTH DAILY.   90 tablet   1   . NITROGLYCERIN ER 2.5 MG PO CPCR   Oral   Take 1 capsule (2.5 mg total) by mouth 3 (three) times daily.   30 capsule   0     BP 180/125  Pulse  84  Temp 98 F (36.7 C) (Oral)  Resp 18  SpO2 97%  Physical Exam  Nursing note and vitals reviewed. Constitutional: He is oriented to person, place, and time. No distress.       Morbidly obese  HENT:  Head: Normocephalic and atraumatic.  Neck: No JVD present.  Cardiovascular: Normal rate, regular rhythm and normal heart sounds.  Exam reveals no gallop and no friction rub.   No murmur heard. Pulmonary/Chest: Effort normal and breath sounds normal. No respiratory distress. He has no wheezes. He has no rales. He exhibits no tenderness.  Musculoskeletal:       Right ankle: no obvious deformity, no  erythema, swelling or effusion. Tenderness to palpation over and medial to achilles tendon. No ecchymosis or bruising at tender site. Weight bearing with no significant pain if only standing. Pain reported with walking. Patient able to passively and actively dorsiflex and extend ankle.  Pain worse with ankle dorsiflexion than with extension.  No peri malleolar tenderness bilaterally.  Negative Thompson's test. Negative Homan's. Entire left lower extremity appears neurovascularly intact.    Neurological: He is alert and oriented to person, place, and time. He has normal strength and normal reflexes. No cranial nerve deficit or sensory deficit. He exhibits normal muscle tone. He displays a negative Romberg sign. Coordination and gait normal.  Skin: No rash noted. He is not diaphoretic.    ED Course  Procedures (including critical care time)  Labs Reviewed - No data to display No results found.   1. Achilles tendinitis of right lower extremity   2. Hypertension   3. Medication refill       MDM  31 year old patient with morbid obesity. Here complaining of right ankle pain impress: #1 right Achilles tendinitis. Recommended to use ice. Change working shoes as they're rubbing the tender area also recommended postural changes at work to avoid hyperextending the ankle when kneeling. Will try to avoid NSAID' as this patient has uncontrolled hypertension. Prescribed tramadol and Tylenol. Also gave #10 pills of Vicodin for pain exacerbation only. #2 hypertension: Gave clonidine 0.2 mg oral here BP at discharge 153/109. Patient remained asymptomatic with normal cardiovascular and neurologic exam. Patient out of amlodipine for less than a week. Gave refill on amlodipine. Encouraged patient to avoid running out of his medications. He saw his cardiologist about 2 weeks ago. Asked to followup with his primary care provider or his cardiologist next week to monitor his blood pressure and adjust his  medications accordingly.         Sharin Grave, MD 08/19/12 [id number]

## 2012-08-17 NOTE — ED Notes (Signed)
Pt c/o right ankle pain since yesterday sudden on set. "very painful with walking" .  Pt denies injury.    Pt BP is 167/124 right arm and recheck manual left arm 180/120. Pt denies chest pain/headache. Dr. Alfonse Ras notified.  Pt states just took BP med 10 mins. Ago and is nervous.

## 2012-08-25 ENCOUNTER — Encounter: Payer: Self-pay | Admitting: Cardiovascular Disease

## 2013-02-05 ENCOUNTER — Encounter: Payer: Self-pay | Admitting: Family Medicine

## 2013-02-05 ENCOUNTER — Ambulatory Visit (INDEPENDENT_AMBULATORY_CARE_PROVIDER_SITE_OTHER): Payer: Managed Care, Other (non HMO) | Admitting: Family Medicine

## 2013-02-05 VITALS — BP 167/97 | HR 92 | Temp 99.0°F | Ht 73.0 in | Wt >= 6400 oz

## 2013-02-05 DIAGNOSIS — N5089 Other specified disorders of the male genital organs: Secondary | ICD-10-CM

## 2013-02-05 DIAGNOSIS — N508 Other specified disorders of male genital organs: Secondary | ICD-10-CM

## 2013-02-05 DIAGNOSIS — I10 Essential (primary) hypertension: Secondary | ICD-10-CM

## 2013-02-05 DIAGNOSIS — E669 Obesity, unspecified: Secondary | ICD-10-CM

## 2013-02-05 LAB — COMPREHENSIVE METABOLIC PANEL
AST: 24 U/L (ref 0–37)
Albumin: 4 g/dL (ref 3.5–5.2)
BUN: 14 mg/dL (ref 6–23)
CO2: 31 mEq/L (ref 19–32)
Calcium: 9.6 mg/dL (ref 8.4–10.5)
Chloride: 98 mEq/L (ref 96–112)
Creat: 1.53 mg/dL — ABNORMAL HIGH (ref 0.50–1.35)
Glucose, Bld: 102 mg/dL — ABNORMAL HIGH (ref 70–99)
Potassium: 3.8 mEq/L (ref 3.5–5.3)

## 2013-02-05 LAB — CBC
Hemoglobin: 13.1 g/dL (ref 13.0–17.0)
MCHC: 32.8 g/dL (ref 30.0–36.0)
WBC: 10.9 10*3/uL — ABNORMAL HIGH (ref 4.0–10.5)

## 2013-02-05 LAB — LDL CHOLESTEROL, DIRECT: Direct LDL: 126 mg/dL — ABNORMAL HIGH

## 2013-02-05 MED ORDER — NITROGLYCERIN ER 2.5 MG PO CPCR: 2.5000 mg | ORAL_CAPSULE | Freq: Once | ORAL | Status: DC | PRN

## 2013-02-05 MED ORDER — CARVEDILOL PHOSPHATE ER 80 MG PO CP24: 80.0000 mg | ORAL_CAPSULE | Freq: Every day | ORAL | Status: DC

## 2013-02-05 NOTE — Progress Notes (Signed)
Subjective:    Leonard Lewis is a 32 y.o. male who presents to [facility] today with complaints of BP recheck::  1. Hypertension:  Long-term problem for this patient.  No adverse effects from medication.  Not checking it regularly.  No HA, CP, dizziness, shortness of breath, palpitations, or LE swelling.  Has been out of his Coreg for several weeks but otherwise endorses medication compliance.   BP Readings from Last 3 Encounters:  02/05/13 167/97  08/17/12 153/109  08/03/12 206/130   2.  Obesity:  Patient interested in starting weight loss medication.  Has heard about Belviq on television.  Is walking in his neighborhood and has joined a gym, but visits infrequently (<1 per week).  Has attempted change in his diet, including more fruits and vegetables.  Also attended bariatric surgery conference, but concerned about surgery.  Not wearing his CPAP regularly despite persistent night time awakenings.   The following portions of the patient's history were reviewed and updated as appropriate: allergies, current medications, past medical history, family and social history, and problem list. Patient is a nonsmoker.    PMH reviewed.  Past Medical History  Diagnosis Date  . Obstructive sleep apnea   . Obesity, morbid   . Hypertension   . CHF (congestive heart failure) 2008    EF 40-50%   No past surgical history on file.  Medications reviewed. Current Outpatient Prescriptions  Medication Sig Dispense Refill  . amLODipine (NORVASC) 10 MG tablet Take 1 tablet (10 mg total) by mouth daily.  90 tablet  0  . aspirin EC 81 MG tablet Take 81 mg by mouth daily.      . carvedilol (COREG CR) 80 MG 24 hr capsule Take 1 capsule (80 mg total) by mouth daily.  90 capsule  3  . HYDROcodone-acetaminophen (VICODIN) 5-500 MG per tablet Take 1 tablet by mouth every 8 (eight) hours as needed for pain.  10 tablet  0  . lisinopril-hydrochlorothiazide (PRINZIDE,ZESTORETIC) 20-25 MG per tablet TAKE 1 TABLET BY MOUTH  DAILY.  90 tablet  1  . nitroGLYCERIN 2.5 MG CR capsule Take 1 capsule (2.5 mg total) by mouth 3 (three) times daily.  30 capsule  0  . traMADol (ULTRAM) 50 MG tablet Take 1 tablet (50 mg total) by mouth every 6 (six) hours as needed for pain.  20 tablet  0   No current facility-administered medications for this visit.    ROS as above otherwise neg.  No chest pain, palpitations, SOB, Fever, Chills, Abd pain, N/V/D.   Objective:   Physical Exam BP 167/97  Pulse 92  Temp(Src) 99 F (37.2 C) (Oral)  Ht 6\' 1"  (1.854 m)  Wt 439 lb (199.129 kg)  BMI 57.93 kg/m2 Gen:  Alert, cooperative patient who appears stated age in no acute distress.  Vital signs reviewed. Morbidly obese HEENT: EOMI,  MMM Cardiac:  Regular rate and rhythm without murmur auscultated.  Good S1/S2. Pulm:  Clear to auscultation bilaterally with good air movement.  No wheezes or rales noted.   Exts: Obese but no overt edema noted  No results found for this or any previous visit (from the past 72 hour(s)).

## 2013-02-05 NOTE — Patient Instructions (Signed)
Sent in the Carvedilol blood pressure medicine.  Only take the nitro if you need.    We can try Belviq for 12 weeks.

## 2013-02-07 MED ORDER — NITROGLYCERIN 0.4 MG SL SUBL: 0.4000 mg | SUBLINGUAL_TABLET | SUBLINGUAL | Status: DC | PRN

## 2013-02-07 NOTE — Assessment & Plan Note (Signed)
Biopsy results returned - hemangioma.  Scanned in chart.

## 2013-02-07 NOTE — Assessment & Plan Note (Signed)
This is main issue for patient.   Difficulty taking responsibility for his health.   Can attempt Belviq but discussed this will not get him where he wants to be.   Willl not prescribe for more than 12 weeks.

## 2013-02-07 NOTE — Assessment & Plan Note (Signed)
Not at goal. Has been out of Coreg. Discussed with him that CPAP will help his blood pressure much more than any medications. Encouraged to continue and increase his exercise as well.

## 2013-02-08 ENCOUNTER — Telehealth: Payer: Self-pay | Admitting: Family Medicine

## 2013-02-08 NOTE — Telephone Encounter (Signed)
Pt was here the other day and Belviq was supposed to have been called in to pharmacy - pls advise  [facility] [address]

## 2013-02-09 MED ORDER — LORCASERIN HCL 10 MG PO TABS: 10.0000 mg | ORAL_TABLET | Freq: Two times a day (BID) | ORAL | Status: DC

## 2013-02-09 NOTE — Telephone Encounter (Signed)
No further refills after 3 months (12 weeks) of therapy.  Will need to be re-evaluated at that time.  If not 5% weight loss, will discontinue medication at that time.

## 2013-03-02 ENCOUNTER — Other Ambulatory Visit: Payer: Self-pay | Admitting: Family Medicine

## 2013-03-19 ENCOUNTER — Ambulatory Visit: Payer: Managed Care, Other (non HMO) | Admitting: Family Medicine

## 2013-03-26 ENCOUNTER — Emergency Department (HOSPITAL_COMMUNITY)
Admission: EM | Admit: 2013-03-26 | Discharge: 2013-03-26 | Disposition: A | Discharge status: Home / Self Care | Payer: Managed Care, Other (non HMO) | Source: Home / Self Care | Attending: Emergency Medicine | Admitting: Emergency Medicine

## 2013-03-26 ENCOUNTER — Encounter (HOSPITAL_COMMUNITY): Payer: Self-pay

## 2013-03-26 ENCOUNTER — Emergency Department (INDEPENDENT_AMBULATORY_CARE_PROVIDER_SITE_OTHER): Payer: Managed Care, Other (non HMO)

## 2013-03-26 DIAGNOSIS — J069 Acute upper respiratory infection, unspecified: Secondary | ICD-10-CM

## 2013-03-26 LAB — POCT I-STAT, CHEM 8
BUN: 13 mg/dL (ref 6–23)
Calcium, Ion: 1.15 mmol/L (ref 1.12–1.23)
Chloride: 100 mEq/L (ref 96–112)
Glucose, Bld: 104 mg/dL — ABNORMAL HIGH (ref 70–99)
TCO2: 30 mmol/L (ref 0–100)

## 2013-03-26 MED ORDER — MUCINEX DM MAXIMUM STRENGTH 60-1200 MG PO TB12: ORAL_TABLET | ORAL | Status: DC

## 2013-03-26 MED ORDER — OLOPATADINE HCL 0.2 % OP SOLN: OPHTHALMIC | Status: DC

## 2013-03-26 MED ORDER — ALBUTEROL SULFATE (5 MG/ML) 0.5% IN NEBU
INHALATION_SOLUTION | RESPIRATORY_TRACT | Status: AC
  Filled 2013-03-26: qty 1

## 2013-03-26 MED ORDER — PREDNISONE 50 MG PO TABS: ORAL_TABLET | ORAL | Status: DC

## 2013-03-26 MED ORDER — ALBUTEROL SULFATE (5 MG/ML) 0.5% IN NEBU
5.0000 mg | INHALATION_SOLUTION | Freq: Once | RESPIRATORY_TRACT | Status: AC
  Administered 2013-03-26: 5 mg via RESPIRATORY_TRACT

## 2013-03-26 MED ORDER — ALBUTEROL SULFATE HFA 108 (90 BASE) MCG/ACT IN AERS: 2.0000 | INHALATION_SPRAY | RESPIRATORY_TRACT | Status: DC | PRN

## 2013-03-26 NOTE — ED Provider Notes (Signed)
History     CSN: [id number]  Arrival date & time 03/26/13  1027   First MD Initiated Contact with Patient 03/26/13 1055      Chief Complaint  Patient presents with  . Cough    (Consider location/radiation/quality/duration/timing/severity/associated sxs/prior treatment) HPI Comments: 32 year old male presents today complaining of productive cough, mild subjective shortness of breath (he states this is due to mucus), sore throat,  Irritation in his left ear, and red itchy eyes. This is been going on for 2 days. The cough is keeping him up at night. Denies any fever, chills, chest pain, swelling in his legs, palpitations.  Patient is a 32 y.o. male presenting with cough.  Cough Associated symptoms: ear pain (itching), eye discharge (clear mucus) and shortness of breath   Associated symptoms: no chest pain, no chills, no diaphoresis, no fever, no myalgias, no rash, no sore throat and no wheezing     Past Medical History  Diagnosis Date  . Obstructive sleep apnea   . Obesity, morbid   . Hypertension   . CHF (congestive heart failure) 2008    EF 40-50%    Past Surgical History  Procedure Laterality Date  . Cardiac catheterization  Oct 2013    cardiac cath negative for obstructive disease -- did show end diastolic pressure secondary to systemic hypertension    Family History  Problem Relation Age of Onset  . Hypertension Mother   . Hypertension Father     History  Substance Use Topics  . Smoking status: Former Smoker    Types: Cigarettes  . Smokeless tobacco: Not on file  . Alcohol Use: No      Review of Systems  Constitutional: Negative for fever, chills, diaphoresis and fatigue.  HENT: Positive for ear pain (itching). Negative for sore throat, neck pain and neck stiffness.   Eyes: Positive for discharge (clear mucus), redness and itching. Negative for photophobia, pain and visual disturbance.  Respiratory: Positive for cough and shortness of breath. Negative for  chest tightness and wheezing.   Cardiovascular: Negative for chest pain, palpitations and leg swelling.  Gastrointestinal: Negative for nausea, vomiting, abdominal pain, diarrhea and constipation.  Genitourinary: Negative for dysuria, urgency, frequency and hematuria.  Musculoskeletal: Negative for myalgias and arthralgias.  Skin: Negative for rash.  Neurological: Negative for dizziness, weakness and light-headedness.    Allergies  Review of patient's allergies indicates no known allergies.  Home Medications   Current Outpatient Rx  Name  Route  Sig  Dispense  Refill  . amLODipine (NORVASC) 10 MG tablet   Oral   Take 1 tablet (10 mg total) by mouth daily.   90 tablet   0   . aspirin EC 81 MG tablet   Oral   Take 81 mg by mouth daily.         . carvedilol (COREG CR) 80 MG 24 hr capsule   Oral   Take 1 capsule (80 mg total) by mouth daily.   90 capsule   3   . lisinopril-hydrochlorothiazide (PRINZIDE,ZESTORETIC) 20-25 MG per tablet      TAKE 1 TABLET BY MOUTH DAILY.   90 tablet   1   . albuterol (PROVENTIL HFA;VENTOLIN HFA) 108 (90 BASE) MCG/ACT inhaler   Inhalation   Inhale 2 puffs into the lungs every 4 (four) hours as needed for wheezing.   1 Inhaler   0   . Dextromethorphan-Guaifenesin (MUCINEX DM MAXIMUM STRENGTH) 60-1200 MG TB12      1 tablet by mouth twice  a day   60 each   0   . HYDROcodone-acetaminophen (VICODIN) 5-500 MG per tablet   Oral   Take 1 tablet by mouth every 8 (eight) hours as needed for pain.   10 tablet   0   . Lorcaserin HCl (BELVIQ) 10 MG TABS   Oral   Take 10 mg by mouth 2 (two) times daily.   60 tablet   2   . nitroGLYCERIN (NITROSTAT) 0.4 MG SL tablet   Sublingual   Place 1 tablet (0.4 mg total) under the tongue every 5 (five) minutes as needed for chest pain.   15 tablet   1   . Olopatadine HCl 0.2 % SOLN      1 drop per eye daily   1 Bottle   0   . predniSONE (DELTASONE) 50 MG tablet      1 tab by mouth daily    5 tablet   0   . traMADol (ULTRAM) 50 MG tablet   Oral   Take 1 tablet (50 mg total) by mouth every 6 (six) hours as needed for pain.   20 tablet   0     BP 197/127  Pulse 118  Temp(Src) 98.1 F (36.7 C) (Oral)  Resp 34  SpO2 93%  Physical Exam  Constitutional: He is oriented to person, place, and time. He appears well-developed and well-nourished. No distress.  Morbidly obese habitus   HENT:  Head: Normocephalic and atraumatic.  Right Ear: Hearing, tympanic membrane and ear canal normal.  Left Ear: Hearing and ear canal normal. Tympanic membrane is erythematous.  Eyes: EOM are normal. Pupils are equal, round, and reactive to light. Right conjunctiva is injected. Left conjunctiva is injected.  Cardiovascular: Normal rate and regular rhythm.  Exam reveals no gallop and no friction rub.   No murmur heard. Pulmonary/Chest: Effort normal and breath sounds normal. No respiratory distress. He has no wheezes. He has no rales.  Abdominal: Soft. There is no tenderness.  Neurological: He is oriented to person, place, and time.  Skin: Skin is warm and dry. No rash noted.  Psychiatric: He has a normal mood and affect. Judgment normal.    ED Course  Procedures (including critical care time)  Labs Reviewed  POCT I-STAT, CHEM 8 - Abnormal; Notable for the following:    Potassium 3.4 (*)    Glucose, Bld 104 (*)    All other components within normal limits   Dg Chest 2 View  03/26/2013   *RADIOLOGY REPORT*  Clinical Data: Cough and shortness of breath for 3 days  CHEST - 2 VIEW  Comparison: 06/03/2012  Findings: Midline trachea.  Moderate enlargement cardiopericardial silhouette. No pleural effusion or pneumothorax.  Mild degradation by patient body habitus.  Mild pulmonary interstitial prominence is favored to be technique and patient body habitus related. No lobar consolidation.  IMPRESSION: Cardiomegaly.  Prominence of the pulmonary interstitium which is favored to be technique  related.  Mild venous congestion felt less likely.  No overt congestive failure.   Original Report Authenticated By: Jeronimo Greaves, M.D.     1. URI (upper respiratory infection)    Patient felt significantly better overall after an albuterol nebulizer treatment.   MDM  Apart from the vital signs, this seems like a moderately severe upper respiratory infection. There is no fever, abnormal lung sounds, or radiographic evidence of pneumonia. We'll treat this as a viral upper respiratory infection and he will follow up here in 2 days for a  recheck. He is instructed to follow up here or with the emergency department immediately if he starts getting worse.   Meds ordered this encounter  Medications  . albuterol (PROVENTIL) (5 MG/ML) 0.5% nebulizer solution 5 mg    Sig:   . Dextromethorphan-Guaifenesin (MUCINEX DM MAXIMUM STRENGTH) 60-1200 MG TB12    Sig: 1 tablet by mouth twice a day    Dispense:  60 each    Refill:  0  . albuterol (PROVENTIL HFA;VENTOLIN HFA) 108 (90 BASE) MCG/ACT inhaler    Sig: Inhale 2 puffs into the lungs every 4 (four) hours as needed for wheezing.    Dispense:  1 Inhaler    Refill:  0  . predniSONE (DELTASONE) 50 MG tablet    Sig: 1 tab by mouth daily    Dispense:  5 tablet    Refill:  0  . Olopatadine HCl 0.2 % SOLN    Sig: 1 drop per eye daily    Dispense:  1 Bottle    Refill:  0           Graylon Good, PA-C 03/26/13 1427

## 2013-03-26 NOTE — ED Provider Notes (Signed)
Medical screening examination/treatment/procedure(s) were performed by non-physician practitioner and as supervising physician I was immediately available for consultation/collaboration.  Leslee Home, M.D.  Reuben Likes, MD 03/26/13 2006

## 2013-03-26 NOTE — ED Notes (Signed)
C/o cough w green secretions since Saturday, c/o unable to sleep last night due to cough. Denies nasal secretions . C/o out of his coreg Rx

## 2013-04-20 ENCOUNTER — Ambulatory Visit: Payer: Managed Care, Other (non HMO) | Admitting: Family Medicine

## 2013-04-24 ENCOUNTER — Telehealth: Payer: Self-pay | Admitting: Family Medicine

## 2013-04-24 NOTE — Telephone Encounter (Signed)
Patient is calling for a refill on Metorprolol to go to [facility] on [address].

## 2013-04-25 NOTE — Telephone Encounter (Signed)
Please call patient and tell him to call his pharmacy for refill. If he is out they will send a request via EPIC.   Will await e refill request prior to sending refill.

## 2013-05-02 ENCOUNTER — Ambulatory Visit: Payer: Managed Care, Other (non HMO) | Admitting: Family Medicine

## 2013-05-28 ENCOUNTER — Encounter: Payer: Self-pay | Admitting: Family Medicine

## 2013-05-28 ENCOUNTER — Ambulatory Visit (INDEPENDENT_AMBULATORY_CARE_PROVIDER_SITE_OTHER): Payer: Managed Care, Other (non HMO) | Admitting: Family Medicine

## 2013-05-28 VITALS — BP 180/140 | HR 109 | Temp 99.1°F | Ht 73.0 in | Wt >= 6400 oz

## 2013-05-28 DIAGNOSIS — E669 Obesity, unspecified: Secondary | ICD-10-CM

## 2013-05-28 DIAGNOSIS — I10 Essential (primary) hypertension: Secondary | ICD-10-CM

## 2013-05-28 MED ORDER — LORCASERIN HCL 10 MG PO TABS: 10.0000 mg | ORAL_TABLET | Freq: Two times a day (BID) | ORAL | Status: DC

## 2013-05-28 MED ORDER — NITROGLYCERIN 0.4 MG SL SUBL: 0.4000 mg | SUBLINGUAL_TABLET | SUBLINGUAL | Status: DC | PRN

## 2013-05-28 MED ORDER — LISINOPRIL-HYDROCHLOROTHIAZIDE 20-25 MG PO TABS: ORAL_TABLET | ORAL | Status: DC

## 2013-05-28 MED ORDER — AMLODIPINE BESYLATE 10 MG PO TABS: 10.0000 mg | ORAL_TABLET | Freq: Every day | ORAL | Status: DC

## 2013-05-28 MED ORDER — METOPROLOL TARTRATE 100 MG PO TABS: 100.0000 mg | ORAL_TABLET | Freq: Two times a day (BID) | ORAL | Status: DC

## 2013-05-28 NOTE — Assessment & Plan Note (Signed)
Patient with hypertensive urgency today.  Anticipate this is his baseline blood pressure as he has not taken his blood pressure medicines in quite a while. I have refilled his amlodipine, metoprolol, lisinopril/HCTZ today.   I advised close follow up with Dr. Gwendolyn Grant in compliance with his blood pressure medications. I also advised compliance with CPAP as this may contribute to hypertension.

## 2013-05-28 NOTE — Progress Notes (Signed)
Subjective:     Patient ID: Leonard Lewis, male   DOB: 19-Nov-1980, 32 y.o.   MRN: [id number]  HPI Mr. Schalk is a 32 year old male with HTN, OSA, and Morbid obesity who presents for follow up.  1) HTN - Patient requesting refills on his medications.  - He reports that he has been out of his medications for quite some time; he has not taken any medications lately.   - He does not check his BP, and has not been compliant with his medication regimen. - He reports occasional SOB.  No chest pain, LE edema, headache, vision changes.    Review of Systems Per HPI    Objective:   Physical Exam Filed Vitals:   05/28/13 0953  BP: 180/140  Pulse: 109  Temp: 99.1  Exam: General: obese gentlemen sitting on table, appears in NAD. Cardiovascular: Tachycardic. No murmurs, rubs, or gallops. Respiratory: CTAB. No rales, rhonchi, or wheeze. Abdomen: obese, soft, nontender. Extremities: Trace LE edema.  Skin: Warm, dry, intact.    Assessment:     See Problem List     Plan:

## 2013-05-28 NOTE — Patient Instructions (Addendum)
It was nice seeing you today.  I have refilled your Lisinopril/HCTZ, Metoprolol, and Amlodipine.  I have also given you a prescription for the Belviq.  It is very important that you take all of your Blood pressure medicines and use your CPAP at night.   Please follow up with Dr. Gwendolyn Grant in 1 month.

## 2013-05-28 NOTE — Assessment & Plan Note (Signed)
Patient reports that he did not start on the Belviq. Patient given Rx for Belviq today. Patient to follow with Dr. Gwendolyn Grant in one month

## 2013-10-11 ENCOUNTER — Other Ambulatory Visit: Payer: Self-pay | Admitting: Family Medicine

## 2013-10-30 ENCOUNTER — Ambulatory Visit (INDEPENDENT_AMBULATORY_CARE_PROVIDER_SITE_OTHER): Payer: BC Managed Care – PPO | Admitting: Family Medicine

## 2013-10-30 ENCOUNTER — Encounter: Payer: Self-pay | Admitting: Family Medicine

## 2013-10-30 VITALS — BP 185/127 | HR 92 | Temp 97.6°F | Ht 73.0 in | Wt >= 6400 oz

## 2013-10-30 DIAGNOSIS — I5022 Chronic systolic (congestive) heart failure: Secondary | ICD-10-CM | POA: Insufficient documentation

## 2013-10-30 DIAGNOSIS — I10 Essential (primary) hypertension: Secondary | ICD-10-CM

## 2013-10-30 DIAGNOSIS — G4733 Obstructive sleep apnea (adult) (pediatric): Secondary | ICD-10-CM

## 2013-10-30 DIAGNOSIS — R062 Wheezing: Secondary | ICD-10-CM

## 2013-10-30 DIAGNOSIS — I5042 Chronic combined systolic (congestive) and diastolic (congestive) heart failure: Secondary | ICD-10-CM | POA: Insufficient documentation

## 2013-10-30 MED ORDER — IPRATROPIUM BROMIDE 0.02 % IN SOLN
0.5000 mg | Freq: Once | RESPIRATORY_TRACT | Status: AC
  Administered 2013-10-30: 0.5 mg via RESPIRATORY_TRACT

## 2013-10-30 MED ORDER — ALBUTEROL SULFATE (2.5 MG/3ML) 0.083% IN NEBU
2.5000 mg | INHALATION_SOLUTION | Freq: Once | RESPIRATORY_TRACT | Status: AC
  Administered 2013-10-30: 2.5 mg via RESPIRATORY_TRACT

## 2013-10-30 NOTE — Patient Instructions (Addendum)
I think that the surgery would be a very good idea for you.  Losing weight will be the best thing for your blood pressure and medical issues.  We will get you set up for the Echo.  I will call you with the results.  Make sure to take your blood pressure medicine daily.

## 2013-10-30 NOTE — Assessment & Plan Note (Addendum)
Urgency again today.  Repeat 174/110 no signs of end organ damage or emergency. Has missed his medications past several days. Compliance an issue.  Discussed this Non-compliant with CPAP For bariatric surgery, which I think will be very helpful for him from health standpoint

## 2013-10-30 NOTE — Assessment & Plan Note (Signed)
This is main issue overriding all visits as well as overall health. Undergoing evaluation for bariatric surgery. I think this would be very helpful for him and contribute positively to his health

## 2013-10-30 NOTE — Assessment & Plan Note (Signed)
Needs repeat prior to bariatric surgery Also will need pre-op clearance most likely.  Due to extensive cardiac history, should have cardiologist input

## 2013-10-30 NOTE — Progress Notes (Signed)
Subjective:    Leonard SimpersGary T Trull is a 33 y.o. male who [facility] to [facility] today for dyspnea:  1.  Dyspnea:  Present for several months.  Worse with exertion or going outside.  Saw asthma/allergy specialist and provided Albuterol, which helps.  Uses this 2-3 times daily.  Dry hacking cough worse at night, alternates with white phlegm production.  No fevers/chills.  No chest pain.  Morbid obesity:  Has already attended the Bariatric Surgery seminar.  Want to go through with this.  Understands how his weight is contributing to his HTN, OSA, and medical conditions.   Had not been told previously about Echo results/hxo heart failure.    HTN:  Not controlled.  No compliant (as in never) with CPAP.  Misses his meds at least 2-3 weekly, not taken several days.  Took 1 hour prior to presentation today.  No chest pain.  DOE as above.  No nausea/headaches/LE edema/abd pain.    Prev health:  Needs flu shot  ROS as above per HPI, otherwise neg.   The following portions of the patient's history were reviewed and updated as appropriate: allergies, current medications, past medical history, family and social history, and problem list. Patient is a nonsmoker.    PMH reviewed.  Past Medical History  Diagnosis Date  . Obstructive sleep apnea   . Obesity, morbid   . Hypertension   . CHF (congestive heart failure) 2008    EF 40-50%   Past Surgical History  Procedure Laterality Date  . Cardiac catheterization  Oct 2013    cardiac cath negative for obstructive disease -- did show end diastolic pressure secondary to systemic hypertension    Medications reviewed. Current Outpatient Prescriptions  Medication Sig Dispense Refill  . albuterol (PROVENTIL HFA;VENTOLIN HFA) 108 (90 BASE) MCG/ACT inhaler Inhale 2 puffs into the lungs every 4 (four) hours as needed for wheezing.  1 Inhaler  0  . amLODipine (NORVASC) 10 MG tablet Take 1 tablet (10 mg total) by mouth daily.  90 tablet  3  . aspirin EC 81 MG tablet  Take 81 mg by mouth daily.      Marland Kitchen. lisinopril-hydrochlorothiazide (PRINZIDE,ZESTORETIC) 20-25 MG per tablet TAKE 1 TABLET BY MOUTH DAILY.  90 tablet  3  . lisinopril-hydrochlorothiazide (PRINZIDE,ZESTORETIC) 20-25 MG per tablet TAKE 1 TABLET BY MOUTH DAILY.  90 tablet  1  . Lorcaserin HCl (BELVIQ) 10 MG TABS Take 10 mg by mouth 2 (two) times daily.  60 tablet  2  . metoprolol (LOPRESSOR) 100 MG tablet Take 1 tablet (100 mg total) by mouth 2 (two) times daily.  180 tablet  3  . nitroGLYCERIN (NITROSTAT) 0.4 MG SL tablet Place 1 tablet (0.4 mg total) under the tongue every 5 (five) minutes as needed for chest pain.  15 tablet  1  . Olopatadine HCl 0.2 % SOLN 1 drop per eye daily  1 Bottle  0   No current facility-administered medications for this visit.     Objective:   Physical Exam There were no vitals taken for this visit. Gen:  Alert, cooperative patient who appears stated age in no acute distress.  Vital signs reviewed. HEENT: EOMI,  MMM.  Arcus present BL.  +3 tonsils BL Cardiac:  Regular rate and rhythm.  Distant Pulm:  Scattered wheezing.  Poor inspiratory effort Abd:  Soft/nondistended.  Obese/nontender Exts: Obese but no pitting edema  No results found for this or any previous visit (from the past 72 hour(s)).

## 2013-10-31 NOTE — Assessment & Plan Note (Signed)
Discussed again importance of CPAP

## 2013-11-07 ENCOUNTER — Ambulatory Visit (HOSPITAL_COMMUNITY)
Admission: RE | Admit: 2013-11-07 | Discharge: 2013-11-07 | Disposition: A | Discharge status: Home / Self Care | Payer: BC Managed Care – PPO | Source: Ambulatory Visit | Attending: Family Medicine | Admitting: Family Medicine

## 2013-11-07 DIAGNOSIS — I5022 Chronic systolic (congestive) heart failure: Secondary | ICD-10-CM

## 2013-11-07 DIAGNOSIS — R062 Wheezing: Secondary | ICD-10-CM

## 2013-11-07 DIAGNOSIS — R0609 Other forms of dyspnea: Secondary | ICD-10-CM | POA: Insufficient documentation

## 2013-11-07 DIAGNOSIS — I10 Essential (primary) hypertension: Secondary | ICD-10-CM | POA: Insufficient documentation

## 2013-11-07 DIAGNOSIS — I059 Rheumatic mitral valve disease, unspecified: Secondary | ICD-10-CM | POA: Insufficient documentation

## 2013-11-07 DIAGNOSIS — I471 Supraventricular tachycardia, unspecified: Secondary | ICD-10-CM | POA: Insufficient documentation

## 2013-11-07 DIAGNOSIS — R0989 Other specified symptoms and signs involving the circulatory and respiratory systems: Secondary | ICD-10-CM | POA: Insufficient documentation

## 2013-11-07 NOTE — Progress Notes (Signed)
Echo Lab  2D Echocardiogram completed.  Ritter Helsley L Sherril Heyward, RDCS 11/07/2013 10:06 AM

## 2013-11-12 ENCOUNTER — Telehealth: Payer: Self-pay | Admitting: Family Medicine

## 2013-11-12 DIAGNOSIS — I5022 Chronic systolic (congestive) heart failure: Secondary | ICD-10-CM

## 2013-11-12 MED ORDER — ALBUTEROL SULFATE HFA 108 (90 BASE) MCG/ACT IN AERS: 2.0000 | INHALATION_SPRAY | RESPIRATORY_TRACT | Status: DC | PRN

## 2013-11-12 MED ORDER — ALBUTEROL SULFATE (2.5 MG/3ML) 0.083% IN NEBU: 2.5000 mg | INHALATION_SOLUTION | Freq: Four times a day (QID) | RESPIRATORY_TRACT | Status: DC | PRN

## 2013-11-12 NOTE — Telephone Encounter (Signed)
Patient with markedly decreased EF.  Called and discussed this with him.  Currently on ACE/beta blocker combination.  States he is about at his baseline regarding dyspnea.    Discussed EF, what this means, and fact this is a main reason of his dyspnea.  No symptoms of overload per patient.   Plan: - refer back to cardiology today for HFrEF, especially as he is interested in bariatric surgery - discussed red flags:  worsening dyspnea, worsening chest pain, etc. As indications to return here or go to ED  Patient expressed understanding.  He is to call here this week or next if he hasn't heard from cardiology.

## 2013-11-15 ENCOUNTER — Ambulatory Visit: Payer: BC Managed Care – PPO | Admitting: Cardiovascular Disease

## 2013-11-21 ENCOUNTER — Encounter (INDEPENDENT_AMBULATORY_CARE_PROVIDER_SITE_OTHER): Payer: Self-pay | Admitting: Surgery

## 2013-11-21 ENCOUNTER — Ambulatory Visit (INDEPENDENT_AMBULATORY_CARE_PROVIDER_SITE_OTHER): Payer: BC Managed Care – PPO | Admitting: Surgery

## 2013-11-21 ENCOUNTER — Other Ambulatory Visit (INDEPENDENT_AMBULATORY_CARE_PROVIDER_SITE_OTHER): Payer: Self-pay

## 2013-11-21 NOTE — Patient Instructions (Signed)
Sleeve Gastrectomy A sleeve gastrectomy is a surgery in which a large portion of the stomach is removed. After the surgery, the stomach will be a narrow tube about the size of a banana. This surgery is performed to help a person lose weight. The person loses weight because the reduced size of the stomach restricts the amount of food that the person can eat. The stomach will hold much less food than before the surgery. Also, the part of the stomach that is removed produces a hormone that causes hunger.  This surgery is done for people who have morbid obesity, defined as a body mass index (BMI) greater than 40. BMI is an estimate of body fat and is calculated from the height and weight of a person. This surgery may also be done for people with a BMI between 35 and 40 if they have other diseases, such as type 2 diabetes mellitus, obstructive sleep apnea, or heart and lung disorders (cardiopulmonary diseases).  LET YOUR HEALTH CARE PROVIDER KNOW ABOUT:  Any allergies you have.   All medicines you are taking, including vitamins, herbs, eyedrops, creams, and over-the-counter medicines.   Use of steroids (by mouth or creams).   Previous problems you or members of your family have had with the use of anesthetics.   Any blood disorders you have.   Previous surgeries you have had.   Possibility of pregnancy, if this applies.   Other health problems you have. RISKS AND COMPLICATIONS Generally, sleeve gastrectomy is a safe procedure. However, as with any procedure, complications can occur. Possible complications include:  Infection.  Bleeding.  Blood clots.  Damage to other organs or tissue.  Leakage of fluid from the stomach into the abdominal cavity (rare). BEFORE THE PROCEDURE  You may need to have blood tests and imaging tests (such as X-rays or ultrasonography) done before the day of surgery. A test to evaluate your esophagus and how it moves (esophageal manometry) may also be  done.  You may be placed on a liquid diet 2 3 weeks before the surgery.  Ask your health care provider about changing or stopping your regular medicines.  Do not eat or drink anything for at least 8 hours before the procedure.   Make plans to have someone drive you home after your hospital stay. Also arrange for someone to help you with activities during recovery. PROCEDURE  A laparoscopic technique is usually used for this surgery:  You will be given medicine to make you sleep through the procedure (general anesthetic). This medicine will be given through an intravenous (IV) access tube that is put into one of your veins.  Once you are asleep, your abdomen will be cleaned and sterilized.  Several small incisions will be made in your abdomen.  Your abdomen will be filled with air so that it expands. This gives the surgeon more room to operate and makes your organs easier to see.  A thin, lighted tube with a tiny camera on the end (laparoscope) is put through a small incision in your abdomen. The camera on the laparoscope sends a picture to a TV screen in the operating room. This gives the surgeon a good view inside the abdomen.  Hollow tubes are put through the other small incisions in your abdomen. The tools needed for the procedure are put through these tubes.  The surgeon uses staples to divide part of the stomach and then removes it through one of the incisions.  The remaining stomach may be reinforced using   stitches or surgical glue or both to prevent leakage of the stomach contents. A small tube (drain) may be placed through one of the incisions to allow extra fluid to flow from the area.  The incisions are closed with stitches, staples, or glue. AFTER THE PROCEDURE  You will be monitored closely in a recovery area. Once the anesthetic has worn off, you will likely be moved to a regular hospital room.  You will be given medicine for pain and nausea.   You may have a drain  from one of the incisions in your abdomen. If a drain is used, it may stay in place after you go home from the hospital and be removed at a follow-up appointment.   You will be encouraged to walk around several times a day. This helps prevent blood clots.  You will be started on a liquid diet the first day after your surgery. Sometimes a test is done to check for leaking before you can eat.  You will be urged to cough and do deep breathing exercises. This helps prevent a lung infection after a surgery.  You will likely need to stay in the hospital for a few days.  Document Released: 07/18/2009 Document Revised: 05/23/2013 Document Reviewed: 02/02/2013 [employer] Patient Information 2014 [employer].  

## 2013-11-21 NOTE — Progress Notes (Signed)
Chief Complaint:  Morbid obesity BMI 59-desires sleeve gastrectomy  History of Present Illness:  Leonard Lewis is an 33 y.o. male who comes this afternoon with his mother to discuss sleeve gastrectomy. He has been to one of our seminars. He has had a problem with being overweight all of his life and presents with a BMI of 59. He also has obstructive sleep apnea but does not use his mass because it does not fit very well. He has tried numerous attempts to lose weight with very limited success. He wants to have the sleeve gastrectomy to try to improve his hypertension, heart failure, sleep apnea and pain in his knees. His exercise tolerance is limited and he can barely go up a flight of stairs. His primary care physician is Dr. Gwendolyn GrantWalden and his cardiologist is Dr. Carney BernHarr 1 he. Both have supported his decision to move forward with bariatric surgery.  His attempts at weight loss included Weight Watchers, Slim fast, diet pills with very limited success. He knows folks that had bariatric surgery and is as helped him seek this is a way to lose weight.  Past Medical History  Diagnosis Date  . Obstructive sleep apnea   . Obesity, morbid   . Hypertension   . CHF (congestive heart failure) 2008    EF 40-50%  . Asthma     Past Surgical History  Procedure Laterality Date  . Cardiac catheterization  Oct 2013    cardiac cath negative for obstructive disease -- did show end diastolic pressure secondary to systemic hypertension    Current Outpatient Prescriptions  Medication Sig Dispense Refill  . albuterol (PROVENTIL HFA;VENTOLIN HFA) 108 (90 BASE) MCG/ACT inhaler Inhale 2 puffs into the lungs every 4 (four) hours as needed for wheezing.  1 Inhaler  0  . albuterol (PROVENTIL) (2.5 MG/3ML) 0.083% nebulizer solution Take 3 mLs (2.5 mg total) by nebulization every 6 (six) hours as needed for wheezing or shortness of breath.  150 mL  1  . amLODipine (NORVASC) 10 MG tablet Take 1 tablet (10 mg total) by mouth  daily.  90 tablet  3  . aspirin EC 81 MG tablet Take 81 mg by mouth daily.      . carvedilol (COREG) 25 MG tablet       . lisinopril (PRINIVIL,ZESTRIL) 40 MG tablet       . nitroGLYCERIN (NITROSTAT) 0.4 MG SL tablet Place 1 tablet (0.4 mg total) under the tongue every 5 (five) minutes as needed for chest pain.  15 tablet  1   No current facility-administered medications for this visit.   Review of patient's allergies indicates no known allergies. Family History  Problem Relation Age of Onset  . Hypertension Mother   . Hypertension Father    Social History:   reports that he quit smoking about 7 years ago. His smoking use included Cigarettes. He smoked 0.00 packs per day. He does not have any smokeless tobacco history on file. He reports that he does not drink alcohol or use illicit drugs.   REVIEW OF SYSTEMS - PERTINENT POSITIVES ONLY: Negative for DVT. No prior abdominal surgery.  Physical Exam:   Blood pressure 170/120, pulse 88, temperature 98.4 F (36.9 C), temperature source Oral, resp. rate 16, height 5' 11.5" (1.816 m), weight 431 lb 9.6 oz (195.772 kg). Body mass index is 59.36 kg/(m^2).  Gen:  WDWN African American male NAD  Neurological: Alert and oriented to person, place, and time. Motor and sensory function is grossly intact  Head: Normocephalic and atraumatic.  Eyes: Conjunctivae are normal. Pupils are equal, round, and reactive to light. No scleral icterus.  Neck: Normal range of motion. Neck supple. No tracheal deviation or thyromegaly present.  Cardiovascular:  SR without murmurs or gallops.  No carotid bruits Respiratory: Effort normal.  No respiratory distress. No chest wall tenderness. Breath sounds normal.  No wheezes, rales or rhonchi.  Abdomen:  Obese without any scars. I went over the trocar placement with him. GU: Musculoskeletal: Normal range of motion. Extremities are nontender. No cyanosis, edema or clubbing noted Lymphadenopathy: No cervical,  preauricular, postauricular or axillary adenopathy is present Skin: Skin is warm and dry. No rash noted. No diaphoresis. No erythema. No pallor. Pscyh: Normal mood and affect. Behavior is normal. Judgment and thought content normal.   LABORATORY RESULTS: No results found for this or any previous visit (from the past 48 hour(s)).  RADIOLOGY RESULTS: No results found.  Problem List: Patient Active Problem List   Diagnosis Date Noted  . Chronic systolic heart failure 10/30/2013  . Chest pain 06/20/2012  . Hyperglycemia 10/26/2011  . OBSTRUCTIVE SLEEP APNEA 05/31/2007  . HYPERTENSION, BENIGN ESSENTIAL 05/02/2007  . HYPERLIPIDEMIA 03/17/2007  . Morbid obesity 03/17/2007    Assessment & Plan: Morbid obesity with a weight of 431 and a BMI of 59.3. We'll move toward laparoscopic sleeve gastrectomy.    Matt B. Daphine Deutscher, MD, [facility] [phone] beeper [phone]  11/21/2013 3:39 PM

## 2013-11-21 NOTE — Addendum Note (Signed)
Addended by: Brennan BaileyBROOKS, Ramello Cordial on: 11/21/2013 03:50 PM   Modules accepted: Orders

## 2013-11-23 ENCOUNTER — Encounter: Payer: Self-pay | Admitting: Cardiovascular Disease

## 2013-12-06 ENCOUNTER — Ambulatory Visit (HOSPITAL_COMMUNITY)
Admission: RE | Admit: 2013-12-06 | Discharge: 2013-12-06 | Disposition: A | Discharge status: Home / Self Care | Payer: BC Managed Care – PPO | Source: Ambulatory Visit | Attending: Surgery | Admitting: Surgery

## 2013-12-06 ENCOUNTER — Other Ambulatory Visit: Payer: Self-pay

## 2013-12-06 DIAGNOSIS — G4733 Obstructive sleep apnea (adult) (pediatric): Secondary | ICD-10-CM | POA: Insufficient documentation

## 2013-12-06 DIAGNOSIS — I517 Cardiomegaly: Secondary | ICD-10-CM | POA: Insufficient documentation

## 2013-12-06 DIAGNOSIS — I509 Heart failure, unspecified: Secondary | ICD-10-CM | POA: Insufficient documentation

## 2013-12-06 DIAGNOSIS — R7309 Other abnormal glucose: Secondary | ICD-10-CM | POA: Insufficient documentation

## 2013-12-06 DIAGNOSIS — I10 Essential (primary) hypertension: Secondary | ICD-10-CM | POA: Insufficient documentation

## 2013-12-06 DIAGNOSIS — J45909 Unspecified asthma, uncomplicated: Secondary | ICD-10-CM | POA: Insufficient documentation

## 2013-12-06 DIAGNOSIS — Z6841 Body Mass Index (BMI) 40.0 and over, adult: Secondary | ICD-10-CM | POA: Insufficient documentation

## 2013-12-06 DIAGNOSIS — E785 Hyperlipidemia, unspecified: Secondary | ICD-10-CM | POA: Insufficient documentation

## 2013-12-06 LAB — COMPREHENSIVE METABOLIC PANEL
ALBUMIN: 3.9 g/dL (ref 3.5–5.2)
ALT: 17 U/L (ref 0–53)
AST: 17 U/L (ref 0–37)
Alkaline Phosphatase: 42 U/L (ref 39–117)
BUN: 14 mg/dL (ref 6–23)
CHLORIDE: 103 meq/L (ref 96–112)
CO2: 30 meq/L (ref 19–32)
Calcium: 9.2 mg/dL (ref 8.4–10.5)
Creat: 1.37 mg/dL — ABNORMAL HIGH (ref 0.50–1.35)
GLUCOSE: 98 mg/dL (ref 70–99)
POTASSIUM: 4 meq/L (ref 3.5–5.3)
SODIUM: 142 meq/L (ref 135–145)
TOTAL PROTEIN: 7.2 g/dL (ref 6.0–8.3)
Total Bilirubin: 0.4 mg/dL (ref 0.2–1.2)

## 2013-12-06 LAB — CBC
HCT: 37.3 % — ABNORMAL LOW (ref 39.0–52.0)
HEMOGLOBIN: 11.6 g/dL — AB (ref 13.0–17.0)
MCH: 20.1 pg — ABNORMAL LOW (ref 26.0–34.0)
MCHC: 31.1 g/dL (ref 30.0–36.0)
MCV: 64.5 fL — ABNORMAL LOW (ref 78.0–100.0)
Platelets: 330 10*3/uL (ref 150–400)
RBC: 5.78 MIL/uL (ref 4.22–5.81)
RDW: 18.6 % — ABNORMAL HIGH (ref 11.5–15.5)
WBC: 7.7 10*3/uL (ref 4.0–10.5)

## 2013-12-07 LAB — TSH: TSH: 1.691 u[IU]/mL (ref 0.350–4.500)

## 2013-12-07 LAB — T4: T4 TOTAL: 9.2 ug/dL (ref 5.0–12.5)

## 2014-01-02 ENCOUNTER — Encounter: Payer: Self-pay | Admitting: Dietician

## 2014-01-02 ENCOUNTER — Encounter: Payer: BC Managed Care – PPO | Attending: Surgery | Admitting: Dietician

## 2014-01-02 DIAGNOSIS — Z01818 Encounter for other preprocedural examination: Secondary | ICD-10-CM | POA: Insufficient documentation

## 2014-01-02 DIAGNOSIS — Z713 Dietary counseling and surveillance: Secondary | ICD-10-CM | POA: Insufficient documentation

## 2014-01-02 NOTE — Progress Notes (Signed)
  Pre-Op Assessment Visit:  Pre-Operative Gastric Sleeve Surgery  Medical Nutrition Therapy:  Appt start time: 1500   End time:  1530.  Patient was seen on 01/02/2014 for Pre-Operative Gastric Sleeve Nutrition Assessment. Assessment and letter of approval [facility] coordinator on 01/02/2014.   Preferred Learning Style:   No preference indicated   Learning Readiness:  Contemplating  Handouts given during visit include:  Pre-Op Goals Bariatric Surgery Protein Shakes BELT Program flyer  Teaching Method Utilized: Visual Auditory Hands on  Barriers to learning/adherence to lifestyle change: none  Demonstrated degree of understanding via:  Teach Back   [facility] to call the [facility] to enroll in Pre-Op and Post-Op Nutrition Education when surgery date is scheduled.

## 2014-01-02 NOTE — Patient Instructions (Signed)
Patient to call the [facility] to enroll in Pre-Op and Post-Op Nutrition Education when surgery date is scheduled. 

## 2014-01-30 ENCOUNTER — Telehealth: Payer: Self-pay | Admitting: Family Medicine

## 2014-01-30 NOTE — Telephone Encounter (Signed)
Please advise.Thank you.Leonard Lewis  

## 2014-01-30 NOTE — Telephone Encounter (Signed)
Patient calls requesting a letter from Dr. Gwendolyn GrantWalden clearing him for gastric sleeve surgery. Please call when ready for pick up. [phone]

## 2014-02-01 NOTE — Telephone Encounter (Signed)
Mr. Leonard Lewis had evidence of decompensated heart failure with reduced EF last Echo.  Recommended at that time to see cardiology.  Needs to be seen pre-op by cardiologist for medical clearance.  Please have patient call and schedule appt with cardiology -- I can put in referral if needed.

## 2014-02-01 NOTE — Telephone Encounter (Signed)
Relayed message,patient voiced understanding.Leonard Lewis S Shaneil Yazdi  

## 2014-03-12 ENCOUNTER — Telehealth: Payer: Self-pay | Admitting: Family Medicine

## 2014-03-12 NOTE — Telephone Encounter (Signed)
Still needs the letter for his bariatric dr. According to nurse it was never received Dr Daphine Deutscher is the surgeon

## 2014-03-13 NOTE — Telephone Encounter (Signed)
I'm not sure what this means?  If it means "clearance for surgery" he will need to see his cardiologist as we discussed at his last visit. Would you mind calling and clarifying?

## 2014-03-13 NOTE — Telephone Encounter (Signed)
Pt returned call and said the letter needs to be about WHY he need's  the surgery. jw

## 2014-03-13 NOTE — Telephone Encounter (Signed)
Left message with family member to have him return my call.[place], Virgel Bouquet

## 2014-03-13 NOTE — Telephone Encounter (Signed)
Patient was advise to contact he's cardiologist as Dr Gwendolyn Grant instructs.He still refuse to understand and states he was going to  call his surgeon and call be back with more information.Laraina Sulton, Virgel Bouquet

## 2014-05-02 ENCOUNTER — Encounter: Payer: BC Managed Care – PPO | Attending: Surgery | Admitting: Dietician

## 2014-05-02 DIAGNOSIS — Z713 Dietary counseling and surveillance: Secondary | ICD-10-CM | POA: Insufficient documentation

## 2014-05-02 DIAGNOSIS — Z6841 Body Mass Index (BMI) 40.0 and over, adult: Secondary | ICD-10-CM | POA: Insufficient documentation

## 2014-05-02 NOTE — Patient Instructions (Signed)
Work on limiting bread and sugar. Start going to gym 2 x week. (30 minutes half cardio and half weights).  Make your drinks decaf, non carbonated, and sugar free. (decaf tea with splenda or packets).

## 2014-05-02 NOTE — Progress Notes (Signed)
  6 Months Supervised Weight Loss Visit:   Pre-Operative Sleeve Gastrectomy or LAGB Surgery  Medical Nutrition Therapy:  Appt start time: 1400 end time:  1415.  Primary concerns today: Supervised Weight Loss Visit. Reviewed Pre-Op Goals and Protein Shakes. Recommended choosing 3 goals to work on for each month of supervised weight loss.   Weight: 450.9 lbs BMI: 62.0   Medications: see list  Recent physical activity:  none  Progress Towards Goal(s):  In progress.  Handouts given during visit include:  Protein Shakes  Pre-Op Goals   Nutritional Diagnosis:  Amo-3.3 Overweight/obesity related to past poor dietary habits and physical inactivity as evidenced by patient plans for bariatric surgery following dietary guidelines for continued weight loss.    Intervention:  Nutrition counseling provided. Plan: Work on limiting bread and sugar. Start going to gym 2 x week. (30 minutes half cardio and half weights).  Make your drinks decaf, non carbonated, and sugar free. (decaf tea with splenda or packets).   Monitoring/Evaluation:  Dietary intake, exercise, and body weight. Follow up in 1 months for 6 month supervised weight loss visit.

## 2014-06-05 ENCOUNTER — Ambulatory Visit: Payer: BC Managed Care – PPO | Admitting: Dietician

## 2014-06-09 ENCOUNTER — Encounter (HOSPITAL_COMMUNITY): Payer: Self-pay | Admitting: Emergency Medicine

## 2014-06-09 ENCOUNTER — Emergency Department (HOSPITAL_COMMUNITY)
Admission: EM | Admit: 2014-06-09 | Discharge: 2014-06-09 | Disposition: A | Discharge status: Home / Self Care | Payer: BC Managed Care – PPO | Source: Home / Self Care | Attending: Family Medicine | Admitting: Family Medicine

## 2014-06-09 DIAGNOSIS — J40 Bronchitis, not specified as acute or chronic: Secondary | ICD-10-CM

## 2014-06-09 MED ORDER — IPRATROPIUM-ALBUTEROL 0.5-2.5 (3) MG/3ML IN SOLN
RESPIRATORY_TRACT | Status: AC
  Filled 2014-06-09: qty 3

## 2014-06-09 MED ORDER — AZITHROMYCIN 250 MG PO TABS: 250.0000 mg | ORAL_TABLET | Freq: Every day | ORAL | Status: DC

## 2014-06-09 MED ORDER — PREDNISONE 10 MG PO TABS
30.0000 mg | ORAL_TABLET | Freq: Every day | ORAL | Status: DC
Start: 2014-06-09 — End: 2017-06-10

## 2014-06-09 MED ORDER — PREDNISONE 10 MG PO TABS: 30.0000 mg | ORAL_TABLET | Freq: Every day | ORAL | Status: DC

## 2014-06-09 MED ORDER — IPRATROPIUM-ALBUTEROL 0.5-2.5 (3) MG/3ML IN SOLN
3.0000 mL | Freq: Once | RESPIRATORY_TRACT | Status: AC
  Administered 2014-06-09: 3 mL via RESPIRATORY_TRACT

## 2014-06-09 MED ORDER — IPRATROPIUM BROMIDE 0.06 % NA SOLN: 2.0000 | Freq: Four times a day (QID) | NASAL | Status: DC

## 2014-06-09 NOTE — Discharge Instructions (Signed)
Thank you for coming in today. Take prednisone daily Use atrovent as directed.  Take azithromycin if not better.  Call or go to the emergency room if you get worse, have trouble breathing, have chest pains, or palpitations.   Acute Bronchitis Bronchitis is inflammation of the airways that extend from the windpipe into the lungs (bronchi). The inflammation often causes mucus to develop. This leads to a cough, which is the most common symptom of bronchitis.  In acute bronchitis, the condition usually develops suddenly and goes away over time, usually in a couple weeks. Smoking, allergies, and asthma can make bronchitis worse. Repeated episodes of bronchitis may cause further lung problems.  CAUSES Acute bronchitis is most often caused by the same virus that causes a cold. The virus can spread from person to person (contagious) through coughing, sneezing, and touching contaminated objects. SIGNS AND SYMPTOMS   Cough.   Fever.   Coughing up mucus.   Body aches.   Chest congestion.   Chills.   Shortness of breath.   Sore throat.  DIAGNOSIS  Acute bronchitis is usually diagnosed through a physical exam. Your health care provider will also ask you questions about your medical history. Tests, such as chest X-rays, are sometimes done to rule out other conditions.  TREATMENT  Acute bronchitis usually goes away in a couple weeks. Oftentimes, no medical treatment is necessary. Medicines are sometimes given for relief of fever or cough. Antibiotic medicines are usually not needed but may be prescribed in certain situations. In some cases, an inhaler may be recommended to help reduce shortness of breath and control the cough. A cool mist vaporizer may also be used to help thin bronchial secretions and make it easier to clear the chest.  HOME CARE INSTRUCTIONS  Get plenty of rest.   Drink enough fluids to keep your urine clear or pale yellow (unless you have a medical condition that  requires fluid restriction). Increasing fluids may help thin your respiratory secretions (sputum) and reduce chest congestion, and it will prevent dehydration.   Take medicines only as directed by your health care provider.  If you were prescribed an antibiotic medicine, finish it all even if you start to feel better.  Avoid smoking and secondhand smoke. Exposure to cigarette smoke or irritating chemicals will make bronchitis worse. If you are a smoker, consider using nicotine gum or skin patches to help control withdrawal symptoms. Quitting smoking will help your lungs heal faster.   Reduce the chances of another bout of acute bronchitis by washing your hands frequently, avoiding people with cold symptoms, and trying not to touch your hands to your mouth, nose, or eyes.   Keep all follow-up visits as directed by your health care provider.  SEEK MEDICAL CARE IF: Your symptoms do not improve after 1 week of treatment.  SEEK IMMEDIATE MEDICAL CARE IF:  You develop an increased fever or chills.   You have chest pain.   You have severe shortness of breath.  You have bloody sputum.   You develop dehydration.  You faint or repeatedly feel like you are going to pass out.  You develop repeated vomiting.  You develop a severe headache. MAKE SURE YOU:   Understand these instructions.  Will watch your condition.  Will get help right away if you are not doing well or get worse. Document Released: 10/28/2004 Document Revised: 02/04/2014 Document Reviewed: 03/13/2013 [facility] Patient Information 2015 [place], [place]. This information is not intended to replace advice given to you by  your health care provider. Make sure you discuss any questions you have with your health care provider. ° °

## 2014-06-09 NOTE — ED Provider Notes (Signed)
Leonard Lewis is a 33 y.o. male who presents to Urgent Care today for congestion. Patient has a one-day history of nasal congestion headache body ache sore throat cough. The cough is productive. Patient denies any fevers chills nausea vomiting or diarrhea. He notes mild wheezing as well. He's tried DayQuil Alka-Seltzer which helps some.   Past Medical History  Diagnosis Date  . Obstructive sleep apnea   . Obesity, morbid   . Hypertension   . CHF (congestive heart failure) 2008    EF 40-50%  . Asthma    History  Substance Use Topics  . Smoking status: Former Smoker    Types: Cigarettes    Quit date: 10/04/2006  . Smokeless tobacco: Not on file  . Alcohol Use: No   ROS as above Medications: No current facility-administered medications for this encounter.   Current Outpatient Prescriptions  Medication Sig Dispense Refill  . spironolactone (ALDACTONE) 50 MG tablet Take 50 mg by mouth daily.      Marland Kitchen albuterol (PROVENTIL HFA;VENTOLIN HFA) 108 (90 BASE) MCG/ACT inhaler Inhale 2 puffs into the lungs every 4 (four) hours as needed for wheezing.  1 Inhaler  0  . albuterol (PROVENTIL) (2.5 MG/3ML) 0.083% nebulizer solution Take 3 mLs (2.5 mg total) by nebulization every 6 (six) hours as needed for wheezing or shortness of breath.  150 mL  1  . amLODipine (NORVASC) 10 MG tablet Take 1 tablet (10 mg total) by mouth daily.  90 tablet  3  . aspirin EC 81 MG tablet Take 81 mg by mouth daily.      Marland Kitchen azithromycin (ZITHROMAX) 250 MG tablet Take 1 tablet (250 mg total) by mouth daily. Take first 2 tablets together, then 1 every day until finished.  6 tablet  0  . carvedilol (COREG) 25 MG tablet       . ipratropium (ATROVENT) 0.06 % nasal spray Place 2 sprays into both nostrils 4 (four) times daily.  15 mL  1  . lisinopril (PRINIVIL,ZESTRIL) 40 MG tablet       . nitroGLYCERIN (NITROSTAT) 0.4 MG SL tablet Place 1 tablet (0.4 mg total) under the tongue every 5 (five) minutes as needed for chest pain.   15 tablet  1  . predniSONE (DELTASONE) 10 MG tablet Take 3 tablets (30 mg total) by mouth daily.  15 tablet  0    Exam:  BP 147/92  Pulse 86  Temp(Src) 97.9 F (36.6 C) (Oral)  Resp 22  SpO2 97% Gen: Well NAD HEENT: EOMI,  MMM posterior pharynx with tonsillar hypertrophy without erythema bilaterally. No exudate. Tympanic membranes are normal appearing bilaterally. Clear nasal discharge with mildly inflamed nasal turbinates bilaterally. Lungs: Increased work of breathing. Some expiratory wheezing present bilaterally. Heart: RRR no MRG Abd: NABS, Soft. Nondistended, Nontender Exts: Brisk capillary refill, warm and well perfused.   Patient was given a DuoNeb nebulizer treatment, and he felt better.  No results found for this or any previous visit (from the past 24 hour(s)). No results found.  Assessment and Plan: 33 y.o. male with bronchitis. Plan to treat with prednisone albuterol Atrovent nasal spray and azithromycin.  Discussed warning signs or symptoms. Please see discharge instructions. Patient expresses understanding.   This note was created using Conservation officer, historic buildings. Any transcription errors are unintended.    Rodolph Bong, MD 06/09/14 1520

## 2014-06-09 NOTE — ED Notes (Signed)
Patient c/o sx including cough, congestion, and fever onset yesterday. Patient reports he has taking a few OTC meds with no relief. Patient is alert and oriented. Breathing heavily on exam.

## 2014-06-13 ENCOUNTER — Encounter: Payer: BC Managed Care – PPO | Attending: Surgery | Admitting: Dietician

## 2014-06-13 DIAGNOSIS — Z6841 Body Mass Index (BMI) 40.0 and over, adult: Secondary | ICD-10-CM | POA: Diagnosis not present

## 2014-06-13 DIAGNOSIS — Z713 Dietary counseling and surveillance: Secondary | ICD-10-CM | POA: Insufficient documentation

## 2014-06-13 NOTE — Progress Notes (Signed)
  6 Months Supervised Weight Loss Visit:   Pre-Operative Sleeve Gastrectomy or LAGB Surgery  Medical Nutrition Therapy:  Appt start time: 1200 end time:  1215.  Primary concerns today: Supervised Weight Loss Visit #2. Returns with a 7.9 lb weight loss since last visit. Drinking diet, decaf drinks, have more greens, less bread, and trying to do some exercise 2 x week.   Currently getting over a cold/brochitis. Overall eating better, having less chips, more fruit. Has "slipped" when stressed. Has had a pain in his knee this month and is going to see the doctor about it on Tuesday.  Weight: 443.0 lbs BMI: 60.9   Medications: see list  Recent physical activity:  2 x week bike for 15-20 minutes + weights  Progress Towards Goal(s):  In progress.  Handouts given during visit include:  Protein Shakes   Nutritional Diagnosis:  [place]-3.3 Overweight/obesity related to past poor dietary habits and physical inactivity as evidenced by patient plans for bariatric surgery following dietary guidelines for continued weight loss.    Intervention:  Nutrition counseling provided. Plan: Keep working on limiting bread and sugar. Start going to gym 3 x week. (30 minutes half cardio and half weights).  Practice chewing food 20-30 x per bite. Practice not drinking while you are eating. Think about trying some protein shakes.   Monitoring/Evaluation:  Dietary intake, exercise, and body weight. Follow up in 1 months for 6 month supervised weight loss visit.

## 2014-06-13 NOTE — Patient Instructions (Signed)
Keep working on limiting bread and sugar. Start going to gym 3 x week. (30 minutes half cardio and half weights).  Practice chewing food 20-30 x per bite. Practice not drinking while you are eating. Think about trying some protein shakes.

## 2014-06-18 ENCOUNTER — Ambulatory Visit: Payer: BC Managed Care – PPO | Admitting: Family Medicine

## 2014-07-04 ENCOUNTER — Ambulatory Visit: Payer: BC Managed Care – PPO | Admitting: Dietician

## 2014-07-15 ENCOUNTER — Encounter: Payer: BC Managed Care – PPO | Attending: Surgery | Admitting: Dietician

## 2014-07-15 DIAGNOSIS — Z713 Dietary counseling and surveillance: Secondary | ICD-10-CM | POA: Insufficient documentation

## 2014-07-15 DIAGNOSIS — Z6841 Body Mass Index (BMI) 40.0 and over, adult: Secondary | ICD-10-CM | POA: Insufficient documentation

## 2014-07-15 NOTE — Progress Notes (Signed)
  6 Months Supervised Weight Loss Visit:   Pre-Operative Sleeve Gastrectomy or LAGB Surgery  Medical Nutrition Therapy:  Appt start time: 345 end time:  400.  Primary concerns today: Supervised Weight Loss Visit #3. Returns with a 12.8 lb weight gain since last visit. Has been busy with work. Has been exercising about 1 x week. Tried Muscle Milk protein drink but did not like it very much.  Still having pain in his knee and needs to see the doctor since pain is not improving. Overall has been limiting sugary foods. Needs to continue working on chewing foods well and practice not drinking during meal times.   Weight: 455.8 lbs BMI: 62.7  Medications: see list  Recent physical activity:  1 x week bike for 15-20 minutes + weights  Progress Towards Goal(s):  In progress.    Nutritional Diagnosis:  [place]-3.3 Overweight/obesity related to past poor dietary habits and physical inactivity as evidenced by patient plans for bariatric surgery following dietary guidelines for continued weight loss.    Intervention:  Nutrition counseling provided. Plan: Remove tempting foods such as chips, pretzels, cheese crackers, and candies from your home.  Practice drinking sugar free, caffeine free, and carbonation free drinks.  For snacks, try beef jerky, nuts, or a cheese stick.  Start going to gym 3 x week. (30 minutes half cardio and half weights).  Practice chewing food 20-30 x per bite. Practice not drinking while you are eating. Think about trying some more protein shakes.   Monitoring/Evaluation:  Dietary intake, exercise, and body weight. Follow up in 1 months for 6 month supervised weight loss visit.

## 2014-07-15 NOTE — Patient Instructions (Addendum)
Remove tempting foods such as chips, pretzels, cheese crackers, and candies from your home.  Practice drinking sugar free, caffeine free, and carbonation free drinks.  For snacks, try beef jerky, nuts, or a cheese stick.  Start going to gym 3 x week. (30 minutes half cardio and half weights).  Practice chewing food 20-30 x per bite. Practice not drinking while you are eating. Think about trying some more protein shakes.

## 2014-08-05 ENCOUNTER — Ambulatory Visit: Payer: BC Managed Care – PPO | Admitting: Dietician

## 2014-08-05 ENCOUNTER — Ambulatory Visit: Payer: BC Managed Care – PPO | Admitting: *Deleted

## 2014-08-15 ENCOUNTER — Encounter: Payer: BC Managed Care – PPO | Attending: Surgery | Admitting: Dietician

## 2014-08-15 DIAGNOSIS — Z6841 Body Mass Index (BMI) 40.0 and over, adult: Secondary | ICD-10-CM | POA: Diagnosis not present

## 2014-08-15 DIAGNOSIS — Z713 Dietary counseling and surveillance: Secondary | ICD-10-CM | POA: Insufficient documentation

## 2014-08-15 NOTE — Progress Notes (Signed)
  6 Months Supervised Weight Loss Visit:   Pre-Operative Sleeve Gastrectomy or LAGB Surgery  Medical Nutrition Therapy:  Appt start time: 835 end time:  850  Primary concerns today: Supervised Weight Loss Visit #4.  Leonard Lewis returns having gained a pound since last visit. He reports that his schedule at work has changed and he now works 2:30-11pm. Eating routine has been off and he is having fast food late at night. He has been having high protein snacks like chicken jerky. Tried Atkins protein drink and liked it okay. Has started going to the gym some, averaging 1x a week. Has been practicing chewing and not drinking while eating and finds himself taking longer to eat. Has been working on removing trigger foods from his home. He has sleep apnea but does not wear his CPAP machine. Still undecided between gastric sleeve and lap band, but leaning more towards band.   Weight: 456.4 lbs BMI: 62.7  Plan: Remove tempting foods such as chips, pretzels, cheese crackers, and candies from your home.  Practice drinking sugar free, caffeine free, and carbonation free drinks.  Try taking a snack to have on the way home from work Start going to gym 3 x week. (30 minutes half cardio and half weights).  Practice chewing food 20-30 x per bite. Practice not drinking while you are eating.  Start wearing CPAP machine.   Medications: see list  Recent physical activity:  1 x week bike for 15-20 minutes + weights  Progress Towards Goal(s):  In progress.    Nutritional Diagnosis:  [place]-3.3 Overweight/obesity related to past poor dietary habits and physical inactivity as evidenced by patient plans for bariatric surgery following dietary guidelines for continued weight loss.    Intervention:  Nutrition counseling provided.    Monitoring/Evaluation:  Dietary intake, exercise, and body weight. Follow up in 1 months for 6 month supervised weight loss visit.

## 2014-08-15 NOTE — Patient Instructions (Addendum)
Remove tempting foods such as chips, pretzels, cheese crackers, and candies from your home.  Practice drinking sugar free, caffeine free, and carbonation free drinks.  Try taking a snack to have on the way home from work Start going to gym 3 x week. (30 minutes half cardio and half weights).  Practice chewing food 20-30 x per bite. Practice not drinking while you are eating.  Start wearing CPAP machine.

## 2014-09-04 ENCOUNTER — Ambulatory Visit: Payer: BC Managed Care – PPO | Admitting: Dietician

## 2014-09-12 ENCOUNTER — Encounter: Payer: BC Managed Care – PPO | Attending: Surgery | Admitting: Dietician

## 2014-09-12 ENCOUNTER — Encounter (HOSPITAL_COMMUNITY): Payer: Self-pay | Admitting: Cardiovascular Disease

## 2014-09-12 DIAGNOSIS — Z713 Dietary counseling and surveillance: Secondary | ICD-10-CM | POA: Diagnosis not present

## 2014-09-12 DIAGNOSIS — Z6841 Body Mass Index (BMI) 40.0 and over, adult: Secondary | ICD-10-CM | POA: Diagnosis not present

## 2014-09-12 NOTE — Patient Instructions (Signed)
Remove tempting foods such as chips, pretzels, cheese crackers, and candies from your home.  Practice drinking sugar free, caffeine free, and carbonation free drinks.  Try taking a snack to have on the way home from work Start going to gym 3 x week. (30 minutes half cardio and half weights).  Practice chewing food 20-30 x per bite. Practice not drinking while you are eating.  Start wearing CPAP machine.      

## 2014-09-12 NOTE — Progress Notes (Signed)
  6 Months Supervised Weight Loss Visit:   Pre-Operative Sleeve Gastrectomy or LAGB Surgery  Medical Nutrition Therapy:  Appt start time: 900 end time:  915  Primary concerns today: Supervised Weight Loss Visit #5.  Jillyn HiddenGary returns having gained 2 pounds since last visit. He is still undecided between lap band and sleeve, but leaning toward lap band. He states that he weighed himself last night and was 455 lbs, has lower extremity swelling. He reports that he has been watching what he is eating and only had a small amount of food at Thanksgiving. Has tried clear Isopure protein shakes and Atkins shake and likes them. He has also removed trigger foods from the home. Has been practicing drinking more water and water with sugar free flavoring. Has cut back on sodas.   Weight: 458.4 lbs BMI: 63.2  Plan: Remove tempting foods such as chips, pretzels, cheese crackers, and candies from your home.  Practice drinking sugar free, caffeine free, and carbonation free drinks.  Try taking a snack to have on the way home from work Start going to gym 3 x week. (30 minutes half cardio and half weights).  Practice chewing food 20-30 x per bite. Practice not drinking while you are eating.  Start wearing CPAP machine.   Medications: see list  Recent physical activity:  1 x week bike for 15-20 minutes + weights  Progress Towards Goal(s):  In progress.    Nutritional Diagnosis:  [place]-3.3 Overweight/obesity related to past poor dietary habits and physical inactivity as evidenced by patient plans for bariatric surgery following dietary guidelines for continued weight loss.    Intervention:  Nutrition counseling provided.    Monitoring/Evaluation:  Dietary intake, exercise, and body weight. Follow up in 1 months for 6 month supervised weight loss visit.

## 2014-09-13 ENCOUNTER — Other Ambulatory Visit: Payer: Self-pay | Admitting: Family Medicine

## 2014-10-07 ENCOUNTER — Ambulatory Visit: Payer: BC Managed Care – PPO | Admitting: Dietician

## 2014-10-10 ENCOUNTER — Encounter: Payer: BLUE CROSS/BLUE SHIELD | Attending: Surgery | Admitting: Dietician

## 2014-10-10 DIAGNOSIS — Z6841 Body Mass Index (BMI) 40.0 and over, adult: Secondary | ICD-10-CM | POA: Insufficient documentation

## 2014-10-10 DIAGNOSIS — Z713 Dietary counseling and surveillance: Secondary | ICD-10-CM | POA: Insufficient documentation

## 2014-10-10 NOTE — Patient Instructions (Signed)
Practice drinking sugar free, caffeine free, and carbonation free drinks.  Start going to gym 3 x week. (30 minutes half cardio and half weights).  Practice chewing food 20-30 x per bite. Practice not drinking while you are eating and waiting 30 minutes after eating to drink. Start wearing CPAP machine.   [facility] class once your surgery is scheduled.

## 2014-10-10 NOTE — Progress Notes (Signed)
  6 Months Supervised Weight Loss Visit:   Pre-Operative Sleeve Gastrectomy or LAGB Surgery  Medical Nutrition Therapy:  Appt start time: 900 end time:  915  Primary concerns today: Supervised Weight Loss Visit #6.  Jillyn HiddenGary returns having lost 1 lb since last visit. Has been working a lot and hasn't been going to the gym as much as before. Drinking mostly water and 1 soda per day.   Weight: 458.4 lbs BMI: 63.2  Plan: Remove tempting foods such as chips, pretzels, cheese crackers, and candies from your home.  Practice drinking sugar free, caffeine free, and carbonation free drinks.  Try taking a snack to have on the way home from work Start going to gym 3 x week. (30 minutes half cardio and half weights).  Practice chewing food 20-30 x per bite. Practice not drinking while you are eating.  Start wearing CPAP machine.   Medications: see list  Recent physical activity:  1 x week bike for 15-20 minutes + weights  Progress Towards Goal(s):  In progress.    Nutritional Diagnosis:  [place]-3.3 Overweight/obesity related to past poor dietary habits and physical inactivity as evidenced by patient plans for bariatric surgery following dietary guidelines for continued weight loss.    Intervention:  Nutrition counseling provided. Plan: Practice drinking sugar free, caffeine free, and carbonation free drinks.  Start going to gym 3 x week. (30 minutes half cardio and half weights).  Practice chewing food 20-30 x per bite. Practice not drinking while you are eating and waiting 30 minutes after eating to drink. Start wearing CPAP machine.   [facility] class once your surgery is scheduled.     Monitoring/Evaluation:  Dietary intake, exercise, and body weight. Follow up to attend Pre-Op Class.

## 2014-11-07 ENCOUNTER — Encounter: Payer: BLUE CROSS/BLUE SHIELD | Attending: Surgery | Admitting: Dietician

## 2014-11-07 DIAGNOSIS — Z6841 Body Mass Index (BMI) 40.0 and over, adult: Secondary | ICD-10-CM | POA: Insufficient documentation

## 2014-11-07 DIAGNOSIS — Z713 Dietary counseling and surveillance: Secondary | ICD-10-CM | POA: Diagnosis not present

## 2014-11-07 NOTE — Progress Notes (Signed)
  6 Months Supervised Weight Loss Visit:   Pre-Operative Sleeve Gastrectomy or LAGB Surgery  Medical Nutrition Therapy:  Appt start time: 300 end time:  315  Primary concerns today: Supervised Weight Loss Visit #7.  Leonard Lewis returns having gained 1 lb since last visit. [facility] [employer] [facility] [employer]. Drinking mostly water and 1 soda per day.   Would like to increase amount of work out time. Really enjoying water fitness class. Also needs to continue to work on chewing foods well and wait 30 minutes after eating before drinking.   Weight: 458.4 lbs BMI: 63.2  Medications: see list  Recent physical activity:  3 x week bike for 15-20 minutes + weights  Progress Towards Goal(s):  In progress.    Nutritional Diagnosis:  [place]-3.3 Overweight/obesity related to past poor dietary habits and physical inactivity as evidenced by patient plans for bariatric surgery following dietary guidelines for continued weight loss.    Intervention:  Nutrition counseling provided. Plan: Practice drinking sugar free, caffeine free, and carbonation free drinks.  Start going to gym 3 x week. (30 minutes half cardio and half weights).  Practice chewing food 20-30 x per bite. Practice not drinking while you are eating and waiting 30 minutes after eating to drink. Start wearing CPAP machine - get part for CPAP machine.   [facility] class once your surgery is scheduled.    Monitoring/Evaluation:  Dietary intake, exercise, and body weight. Follow up to attend Pre-Op Class.

## 2014-11-07 NOTE — Patient Instructions (Addendum)
Practice drinking sugar free, caffeine free, and carbonation free drinks.  Start going to gym 3 x week. (30 minutes half cardio and half weights).  Practice chewing food 20-30 x per bite. Practice not drinking while you are eating and waiting 30 minutes after eating to drink. Start wearing CPAP machine - get part for CPAP machine.   [facility] once your surgery is scheduled.

## 2014-11-30 ENCOUNTER — Emergency Department (HOSPITAL_COMMUNITY)
Admission: EM | Admit: 2014-11-30 | Discharge: 2014-11-30 | Disposition: A | Discharge status: Home / Self Care | Payer: BLUE CROSS/BLUE SHIELD | Source: Home / Self Care | Attending: Emergency Medicine | Admitting: Emergency Medicine

## 2014-11-30 ENCOUNTER — Encounter (HOSPITAL_COMMUNITY): Payer: Self-pay | Admitting: Emergency Medicine

## 2014-11-30 DIAGNOSIS — M25561 Pain in right knee: Secondary | ICD-10-CM

## 2014-11-30 DIAGNOSIS — M25562 Pain in left knee: Secondary | ICD-10-CM

## 2014-11-30 DIAGNOSIS — G8929 Other chronic pain: Secondary | ICD-10-CM

## 2014-11-30 MED ORDER — IBUPROFEN 800 MG PO TABS
800.0000 mg | ORAL_TABLET | Freq: Once | ORAL | Status: AC
  Administered 2014-11-30: 800 mg via ORAL

## 2014-11-30 MED ORDER — MELOXICAM 15 MG PO TABS: 15.0000 mg | ORAL_TABLET | Freq: Every day | ORAL | Status: DC

## 2014-11-30 MED ORDER — IBUPROFEN 800 MG PO TABS
ORAL_TABLET | ORAL | Status: AC
  Filled 2014-11-30: qty 1

## 2014-11-30 NOTE — Discharge Instructions (Signed)

## 2014-11-30 NOTE — ED Notes (Signed)
Left knee pain for 2 weeks.  Denies any injury 2 weeks ago.  Patient acknowledged obesity playing a role in knee.

## 2014-11-30 NOTE — ED Provider Notes (Signed)
CSN: [id number]     Arrival date & time 11/30/14  1140 History   First MD Initiated Contact with Patient 11/30/14 1329     Chief Complaint  Patient presents with  . Knee Pain   (Consider location/radiation/quality/duration/timing/severity/associated sxs/prior Treatment) HPI Comments: Morbidly obese AA male with hx of chronic, intermittent bilateral knee pain presents with 1 day history of pain and left knee without reported injury. States he is scheduled to see his PCP on 12/02/2014 for evaluation. No reported new or old injury. Endorses that he understands that his pain is likely related to his obesity. Is awaiting approval from his insurance company so he can proceed with weight loss surgery with Dr. Hassell Done from [place].  Has not taken any medications for knee discomfort. States pain only occurs with weight bearing.  No hx of gout No previous surgery or orthopedic evaluation Works as ATM repairman  Patient is a 34 y.o. male presenting with knee pain.  Knee Pain   Past Medical History  Diagnosis Date  . Obstructive sleep apnea   . Obesity, morbid   . Hypertension   . CHF (congestive heart failure) 2008    EF 40-50%  . Asthma    Past Surgical History  Procedure Laterality Date  . Cardiac catheterization  Oct 2013    cardiac cath negative for obstructive disease -- did show end diastolic pressure secondary to systemic hypertension  . Left heart catheterization with coronary angiogram N/A 08/03/2012    Procedure: LEFT HEART CATHETERIZATION WITH CORONARY ANGIOGRAM;  Surgeon: Sherren Mocha, MD;  Location: [facility] CATH LAB;  Service: Cardiovascular;  Laterality: N/A;   Family History  Problem Relation Age of Onset  . Hypertension Mother   . Hypertension Father    History  Substance Use Topics  . Smoking status: Former Smoker    Types: Cigarettes    Quit date: 10/04/2006  . Smokeless tobacco: Not on file  . Alcohol Use: No    Review of Systems  All other systems reviewed and are  negative.   Allergies  Review of patient's allergies indicates no known allergies.  Home Medications   Prior to Admission medications   Medication Sig Start Date End Date Taking? Authorizing Provider  albuterol (PROVENTIL HFA;VENTOLIN HFA) 108 (90 BASE) MCG/ACT inhaler Inhale 2 puffs into the lungs every 4 (four) hours as needed for wheezing. 11/12/13   Alveda Reasons, MD  albuterol (PROVENTIL) (2.5 MG/3ML) 0.083% nebulizer solution Take 3 mLs (2.5 mg total) by nebulization every 6 (six) hours as needed for wheezing or shortness of breath. 11/12/13   Alveda Reasons, MD  amLODipine (NORVASC) 10 MG tablet TAKE 1 TABLET (10 MG TOTAL) BY MOUTH DAILY. 09/13/14   Alveda Reasons, MD  aspirin EC 81 MG tablet Take 81 mg by mouth daily.    Historical Provider, MD  azithromycin (ZITHROMAX) 250 MG tablet Take 1 tablet (250 mg total) by mouth daily. Take first 2 tablets together, then 1 every day until finished. 06/09/14   Gregor Hams, MD  carvedilol (COREG) 25 MG tablet  11/16/13   Historical Provider, MD  ipratropium (ATROVENT) 0.06 % nasal spray Place 2 sprays into both nostrils 4 (four) times daily. 06/09/14   Gregor Hams, MD  lisinopril (PRINIVIL,ZESTRIL) 40 MG tablet  11/16/13   Historical Provider, MD  meloxicam (MOBIC) 15 MG tablet Take 1 tablet (15 mg total) by mouth daily. 11/30/14   Audelia Hives Janaiyah Blackard, PA  nitroGLYCERIN (NITROSTAT) 0.4 MG SL tablet Place  1 tablet (0.4 mg total) under the tongue every 5 (five) minutes as needed for chest pain. 05/28/13   Coral Spikes, DO  predniSONE (DELTASONE) 10 MG tablet Take 3 tablets (30 mg total) by mouth daily. 06/09/14   Gregor Hams, MD  spironolactone (ALDACTONE) 50 MG tablet Take 50 mg by mouth daily.    Historical Provider, MD   BP 150/96 mmHg  Pulse 81  Temp(Src) 98.7 F (37.1 C) (Oral)  Resp 20  SpO2 96% Physical Exam  Constitutional: He is oriented to person, place, and time. He appears well-developed and well-nourished. No distress.   Morbidly obese weighing >450lbs  HENT:  Head: Normocephalic and atraumatic.  Cardiovascular: Normal rate.   Pulmonary/Chest: Effort normal.  Musculoskeletal:       Left knee: He exhibits normal range of motion, no swelling, no effusion, no ecchymosis, no deformity, no laceration, no erythema, normal alignment, no LCL laxity, normal patellar mobility, no bony tenderness and normal meniscus. No tenderness found.  Unable to reproduce pain during exam with passive or active ROM or palpation. Only uncomfortable with weight bearing. CSm of LLE normal  Neurological: He is alert and oriented to person, place, and time.  Skin: Skin is warm and dry. No rash noted. No erythema.  Psychiatric: He has a normal mood and affect. His behavior is normal.  Nursing note and vitals reviewed.   ED Course  Procedures (including critical care time) Labs Review Labs Reviewed - No data to display  Imaging Review No results found.   MDM   1. Bilateral chronic knee pain   2. 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

## 2014-12-02 ENCOUNTER — Ambulatory Visit: Payer: BLUE CROSS/BLUE SHIELD | Admitting: Family Medicine

## 2015-01-06 ENCOUNTER — Encounter: Payer: BLUE CROSS/BLUE SHIELD | Attending: Surgery

## 2015-01-06 DIAGNOSIS — Z713 Dietary counseling and surveillance: Secondary | ICD-10-CM | POA: Insufficient documentation

## 2015-01-06 DIAGNOSIS — Z6841 Body Mass Index (BMI) 40.0 and over, adult: Secondary | ICD-10-CM | POA: Insufficient documentation

## 2015-01-06 NOTE — Progress Notes (Signed)
  Pre-Operative Nutrition Class:  Appt start time: 8299   End time:  1830.  Patient was seen on 01/06/15 for Pre-Operative Bariatric Surgery Education [facility]   Surgery date: 01/27/15 Surgery type: Gastric sleeve Start weight at [facility]: 451 lbs on 05/02/14 Weight today: 459.5  TANITA  BODY COMP RESULTS  01/06/15   BMI (kg/m^2) 63.2   Fat Mass (lbs) 293.5   Fat Free Mass (lbs) 166   Total Body Water (lbs) 121.5   Samples given per MNT protocol. Patient educated on appropriate usage: Premier protein shake (qty 1 - vanilla) Lot #: [id number] Exp: 09/2015  Unjury protein powder (unflavored - qty 1) Lot #: [id number] Exp: 11/2015  Bariatric Advantage Calcium citrate chew (orange - qty 1) Lot #: [id number]  Exp: 02/2015  The following the learning objectives were met by the patient during this course:  Identify Pre-Op Dietary Goals and will begin 2 weeks pre-operatively  Identify appropriate sources of fluids and proteins   State protein recommendations and appropriate sources pre and post-operatively  Identify Post-Operative Dietary Goals and will follow for 2 weeks post-operatively  Identify appropriate multivitamin and calcium sources  Describe the need for physical activity post-operatively and will follow MD recommendations  State when to call healthcare provider regarding medication questions or post-operative complications  Handouts given during class include:  Pre-Op Bariatric Surgery Diet Handout  Protein Shake Handout  Post-Op Bariatric Surgery Nutrition Handout  [facility]EL[facility] Program Information Flyer  Support Group Information Flyer  [facility] Bariatric Supplements Price List  Follow-Up Plan: Patient will follow-up at [facility] 2 weeks post operatively for diet advancement per MD.

## 2015-01-08 ENCOUNTER — Ambulatory Visit: Payer: Self-pay | Admitting: Surgery

## 2015-01-08 NOTE — H&P (Signed)
Chief Complaint:  Morbid obesity BMI 62  History of Present Illness:  Leonard Lewis is an 33 y.o. male who has been worked up for sleeve gastrectomy.  UGI was negative for hiatus hernia.  Ultrasound was negative.    Past Medical History  Diagnosis Date  . Obstructive sleep apnea   . Obesity, morbid   . Hypertension   . CHF (congestive heart failure) 2008    EF 40-50%  . Asthma     Past Surgical History  Procedure Laterality Date  . Cardiac catheterization  Oct 2013    cardiac cath negative for obstructive disease -- did show end diastolic pressure secondary to systemic hypertension  . Left heart catheterization with coronary angiogram N/A 08/03/2012    Procedure: LEFT HEART CATHETERIZATION WITH CORONARY ANGIOGRAM;  Surgeon: Michael Cooper, MD;  Location: [facility]ATH [facility];  Service: Cardiovascular;  Laterality: N/A;    Current Outpatient Prescriptions  Medication Sig Dispense Refill  . albuterol (PROVENTIL HFA;VENTOLIN HFA) 108 (90 BASE) MCG/ACT inhaler Inhale 2 puffs into the lungs every 4 (four) hours as needed for wheezing. 1 Inhaler 0  . albuterol (PROVENTIL) (2.5 MG/3ML) 0.083% nebulizer solution Take 3 mLs (2.5 mg total) by nebulization every 6 (six) hours as needed for wheezing or shortness of breath. 150 mL 1  . amLODipine (NORVASC) 10 MG tablet TAKE 1 TABLET (10 MG TOTAL) BY MOUTH DAILY. 90 tablet 1  . aspirin EC 81 MG tablet Take 81 mg by mouth daily.    . azithromycin (ZITHROMAX) 250 MG tablet Take 1 tablet (250 mg total) by mouth daily. Take first 2 tablets together, then 1 every day until finished. 6 tablet 0  . carvedilol (COREG) 25 MG tablet     . ipratropium (ATROVENT) 0.06 % nasal spray Place 2 sprays into both nostrils 4 (four) times daily. 15 mL 1  . lisinopril (PRINIVIL,ZESTRIL) 40 MG tablet     . meloxicam (MOBIC) 15 MG tablet Take 1 tablet (15 mg total) by mouth daily. 30 tablet 0  . nitroGLYCERIN (NITROSTAT) 0.4 MG SL tablet Place 1 tablet (0.4 mg total) under the  tongue every 5 (five) minutes as needed for chest pain. 15 tablet 1  . predniSONE (DELTASONE) 10 MG tablet Take 3 tablets (30 mg total) by mouth daily. 15 tablet 0  . spironolactone (ALDACTONE) 50 MG tablet Take 50 mg by mouth daily.     No current facility-administered medications for this visit.   Review of patient's allergies indicates no known allergies. Family History  Problem Relation Age of Onset  . Hypertension Mother   . Hypertension Father    Social History:   reports that he quit smoking about 8 years ago. His smoking use included Cigarettes. He does not have any smokeless tobacco history on file. He reports that he does not drink alcohol or use illicit drugs.   REVIEW OF SYSTEMS : Negative except for see problem list  Physical Exam:   There were no vitals taken for this visit. There is no weight on file to calculate BMI.  Gen:  WDWN AAM NAD  Neurological: Alert and oriented to person, place, and time. Motor and sensory function is grossly intact  Head: Normocephalic and atraumatic.  Eyes: Conjunctivae are normal. Pupils are equal, round, and reactive to light. No scleral icterus.  Neck: Normal range of motion. Neck supple. No tracheal deviation or thyromegaly present.  Cardiovascular:  SR without murmurs or gallops.  No carotid bruits Breast:  Not examined Respiratory: Effort   normal.  No respiratory distress. No chest wall tenderness. Breath sounds normal.  No wheezes, rales or rhonchi.  Abdomen:  Grossly obese and nontender GU:  Male anatomy Musculoskeletal: Normal range of motion. Extremities are nontender. No cyanosis, edema or clubbing noted Lymphadenopathy: No cervical, preauricular, postauricular or axillary adenopathy is present Skin: Skin is warm and dry. No rash noted. No diaphoresis. No erythema. No pallor. Pscyh: Normal mood and affect. Behavior is normal. Judgment and thought content normal.   LABORATORY RESULTS: No results found for this or any previous  visit (from the past 48 hour(s)).   RADIOLOGY RESULTS: No results found.  Problem List: Patient Active Problem List   Diagnosis Date Noted  . Chronic systolic heart failure 10/30/2013  . Chest pain 06/20/2012  . Hyperglycemia 10/26/2011  . OBSTRUCTIVE SLEEP APNEA 05/31/2007  . HYPERTENSION, BENIGN ESSENTIAL 05/02/2007  . HYPERLIPIDEMIA 03/17/2007  . Morbid obesity 03/17/2007    Assessment & Plan: Morbid obesity BMI 62 for sleeve gastrectomy    Leonard B. Asanti Craigo, MD, FACS  [facility] [phone] beeper [phone]  01/08/2015 10:12 AM      

## 2015-01-10 NOTE — Progress Notes (Signed)
eccho 2/15 chart Cath 10/13 epic

## 2015-01-10 NOTE — Patient Instructions (Addendum)
Your procedure is scheduled on:  01/27/15  MONDAY  [facility]- FOLLOW SIGNS TO SHORT STAY CENTER Short Stay Center at   0515    AM.   Call this number if you have problems the morning of surgery: [phone]        Do not eat food  Or drink :After Midnight Sunday NIGHT.   Take these medicines the morning of surgery with A SIP OF WATER: CARVEDILOL, AMLODIPINE MAY TAKE NITROGLYCERIN IF NEEDED   .BRING [facility] MASK AND TUBING WITH YOU TO HOSPITAL  Contacts, dentures or partial plates, or metal hairpins  can not be worn to surgery. Your family will be responsible for glasses, dentures, hearing aides while you are in surgery  Leave suitcase in the car. After surgery it may be brought to your room.  For patients admitted to the hospital, checkout time is 11:00 AM day of  discharge.         [facility] IS NOT RESPONSIBLE FOR ANY VALUABLES  Patients discharged the day of surgery will not be allowed to drive home. IF going home the day of surgery, you must have a driver and someone to stay with you for the first 24 hours                                                                                                                [place] - Preparing for Surgery Before surgery, you can play an important role.  Because skin is not sterile, your skin needs to be as free of germs as possible.  You can reduce the number of germs on your skin by washing with CHG (chlorahexidine gluconate) soap before surgery.  CHG is an antiseptic cleaner which kills germs and bonds with the skin to continue killing germs even after washing. Please DO NOT use if you have an allergy to CHG or antibacterial soaps.  If your skin becomes reddened/irritated stop using the CHG and inform your nurse when you arrive at Short Stay. Do not shave (including legs and underarms) for at least 48 hours prior to the first CHG shower.  You may shave your face/neck. Please follow these instructions  carefully:  1.  Shower with CHG Soap the night before surgery and the  morning of Surgery.  2.  If you choose to wash your hair, wash your hair first as usual with your  normal  shampoo.  3.  After you shampoo, rinse your hair and body thoroughly to remove the  shampoo.                           4.  Use CHG as you would any other liquid soap.  You can apply chg directly  to the skin and wash                       Gently with a scrungie or clean washcloth.  5.  Apply the CHG Soap to your body ONLY FROM THE NECK DOWN.   Do not use on face/ open                           Wound or open sores. Avoid contact with eyes, ears mouth and genitals (private parts).                       Wash face,  Genitals (private parts) with your normal soap.             6.  Wash thoroughly, paying special attention to the area where your surgery  will be performed.  7.  Thoroughly rinse your body with warm water from the neck down.  8.  DO NOT shower/wash with your normal soap after using and rinsing off  the CHG Soap.                9.  Pat yourself dry with a clean towel.            10.  Wear clean pajamas.            11.  Place clean sheets on your bed the night of your first shower and do not  sleep with pets. Day of Surgery : Do not apply any lotions/deodorants the morning of surgery.  Please wear clean clothes to the hospital/surgery center.  FAILURE TO FOLLOW THESE INSTRUCTIONS MAY RESULT IN THE CANCELLATION OF YOUR SURGERY PATIENT SIGNATURE_________________________________  NURSE SIGNATURE__________________________________  ________________________________________________________________________

## 2015-01-13 ENCOUNTER — Encounter (HOSPITAL_COMMUNITY)
Admission: RE | Admit: 2015-01-13 | Discharge: 2015-01-13 | Disposition: A | Discharge status: Home / Self Care | Payer: BLUE CROSS/BLUE SHIELD | Source: Ambulatory Visit | Attending: Surgery | Admitting: Surgery

## 2015-01-13 ENCOUNTER — Encounter (HOSPITAL_COMMUNITY): Payer: Self-pay

## 2015-01-13 ENCOUNTER — Ambulatory Visit (HOSPITAL_COMMUNITY)
Admission: RE | Admit: 2015-01-13 | Discharge: 2015-01-13 | Disposition: A | Discharge status: Home / Self Care | Payer: BLUE CROSS/BLUE SHIELD | Source: Ambulatory Visit | Attending: Anesthesiology | Admitting: Anesthesiology

## 2015-01-13 DIAGNOSIS — Z6841 Body Mass Index (BMI) 40.0 and over, adult: Secondary | ICD-10-CM | POA: Insufficient documentation

## 2015-01-13 DIAGNOSIS — R9431 Abnormal electrocardiogram [ECG] [EKG]: Secondary | ICD-10-CM | POA: Diagnosis not present

## 2015-01-13 DIAGNOSIS — J45909 Unspecified asthma, uncomplicated: Secondary | ICD-10-CM

## 2015-01-13 DIAGNOSIS — I517 Cardiomegaly: Secondary | ICD-10-CM | POA: Insufficient documentation

## 2015-01-13 DIAGNOSIS — I10 Essential (primary) hypertension: Secondary | ICD-10-CM | POA: Insufficient documentation

## 2015-01-13 DIAGNOSIS — Z01812 Encounter for preprocedural laboratory examination: Secondary | ICD-10-CM | POA: Diagnosis not present

## 2015-01-13 DIAGNOSIS — Z01818 Encounter for other preprocedural examination: Secondary | ICD-10-CM | POA: Insufficient documentation

## 2015-01-13 HISTORY — DX: Unspecified osteoarthritis, unspecified site: M19.90

## 2015-01-13 LAB — COMPREHENSIVE METABOLIC PANEL
ALK PHOS: 51 U/L (ref 39–117)
ALT: 26 U/L (ref 0–53)
AST: 25 U/L (ref 0–37)
Albumin: 4.2 g/dL (ref 3.5–5.2)
Anion gap: 9 (ref 5–15)
BUN: 20 mg/dL (ref 6–23)
CO2: 26 mmol/L (ref 19–32)
Calcium: 9.4 mg/dL (ref 8.4–10.5)
Chloride: 103 mmol/L (ref 96–112)
Creatinine, Ser: 1.51 mg/dL — ABNORMAL HIGH (ref 0.50–1.35)
GFR calc non Af Amer: 59 mL/min — ABNORMAL LOW (ref >90)
GFR, EST AFRICAN AMERICAN: 68 mL/min — AB (ref >90)
GLUCOSE: 98 mg/dL (ref 70–99)
Potassium: 4.6 mmol/L (ref 3.5–5.1)
SODIUM: 138 mmol/L (ref 135–145)
Total Bilirubin: 0.6 mg/dL (ref 0.3–1.2)
Total Protein: 8 g/dL (ref 6.0–8.3)

## 2015-01-13 LAB — CBC WITH DIFFERENTIAL/PLATELET
Basophils Absolute: 0 10*3/uL (ref 0.0–0.1)
Basophils Relative: 0 % (ref 0–1)
EOS ABS: 0.3 10*3/uL (ref 0.0–0.7)
Eosinophils Relative: 5 % (ref 0–5)
HEMATOCRIT: 40.7 % (ref 39.0–52.0)
HEMOGLOBIN: 12.7 g/dL — AB (ref 13.0–17.0)
LYMPHS ABS: 3 10*3/uL (ref 0.7–4.0)
Lymphocytes Relative: 43 % (ref 12–46)
MCH: 22 pg — AB (ref 26.0–34.0)
MCHC: 31.2 g/dL (ref 30.0–36.0)
MCV: 70.5 fL — ABNORMAL LOW (ref 78.0–100.0)
MONO ABS: 0.5 10*3/uL (ref 0.1–1.0)
MONOS PCT: 8 % (ref 3–12)
Neutro Abs: 3.1 10*3/uL (ref 1.7–7.7)
Neutrophils Relative %: 44 % (ref 43–77)
Platelets: 309 10*3/uL (ref 150–400)
RBC: 5.77 MIL/uL (ref 4.22–5.81)
RDW: 16.2 % — ABNORMAL HIGH (ref 11.5–15.5)
WBC: 7 10*3/uL (ref 4.0–10.5)

## 2015-01-14 NOTE — Progress Notes (Signed)
Notified Amy in portable equipment for need of bari bed.

## 2015-01-14 NOTE — Progress Notes (Signed)
LATE ENTRY FOR 01/13/15  1645  Medical history reviewed with Dr Acey Lavarignan- states must have cardiac clearance before surgery.  Called  [facility] nurse line- spoke with ?Victorino DikeJennifer who stated would forward to Dr Norva RiffleMartins nurse for dr Daphine Deutschermartin to review

## 2015-01-15 NOTE — Progress Notes (Signed)
Spoke with Sonya at [facility] who verified that the information was received about needing cardiac clearance and that I(PST)  could not make a appointment for him

## 2015-01-16 ENCOUNTER — Telehealth: Payer: Self-pay | Admitting: *Deleted

## 2015-01-16 DIAGNOSIS — R9430 Abnormal result of cardiovascular function study, unspecified: Secondary | ICD-10-CM

## 2015-01-16 NOTE — Telephone Encounter (Signed)
LM FOR  PT  TO CALL BACK PT  NEEDS  ECHO  NEXT WEEK BEFORE  [facility]  [facility]  FOR  SURGERY .Zack Seal/CY

## 2015-01-16 NOTE — Telephone Encounter (Signed)
-----   Message from Wendall StadePeter C Nishan, MD sent at 01/16/2015  2:17 PM EDT ----- Regarding: RE: Pre-Op Cardiac Clearance Needs an updated echo for EF will try to do next week   ----- Message -----    From: Maryan Pulshristy Moore, CMA    Sent: 01/16/2015  10:24 AM      To: Wendall StadePeter C Nishan, MD Subject: Pre-Op Cardiac Clearance                       Mr. Leonard Lewis is scheduled for Laparoscopic Sleeve Gastrectomy on 01/27/15.  Anesthesia has requested a pre-op cardiac clearance on the patient prior to surgery due to a significant cardiac history.  Please forward clearance information at your earliest convenience.  Thank you,  Maryan Pulshristy Moore, CMA for Dr. Luretha MurphyMatthew Martin, MD

## 2015-01-20 ENCOUNTER — Ambulatory Visit (HOSPITAL_COMMUNITY): Payer: BLUE CROSS/BLUE SHIELD | Attending: Cardiovascular Disease

## 2015-01-20 DIAGNOSIS — R0989 Other specified symptoms and signs involving the circulatory and respiratory systems: Secondary | ICD-10-CM | POA: Diagnosis not present

## 2015-01-20 DIAGNOSIS — R9430 Abnormal result of cardiovascular function study, unspecified: Secondary | ICD-10-CM

## 2015-01-20 NOTE — Progress Notes (Signed)
2D Echo completed. 01/20/2015 

## 2015-01-22 NOTE — Telephone Encounter (Signed)
Royanne FootsStephens, Diallo T >','<< Less Detail',event)" href="javascript:;">More Detail >>   Wendall StadePeter C Nishan, MD   Sent: Wed January 22, 2015 9:28 AM    To: Alois Clichehristine E Tacie Mccuistion, LPN        Message     EF 40-45% much better than last year Ok to have surgery    Report was in funny place under media so I never saw it        ----- Message -----     From: Alois Clichehristine E Rees Matura, LPN     Sent: 1/61/09604/20/2016  8:45 AM      To: Wendall StadePeter C Nishan, MD        PT HAD DONE FOR PRE OP . MAY PT PROCEED WITH SURGERY ?            Will  Forward to  DR MARTIN./CY

## 2015-01-22 NOTE — Telephone Encounter (Signed)
ECHO WAS  DONE  01-20-15./CY

## 2015-01-23 NOTE — Progress Notes (Signed)
eccho 1/16 epic under media tab Clearance note dr Eden Emmsnishan under telephone note

## 2015-01-27 ENCOUNTER — Inpatient Hospital Stay (HOSPITAL_COMMUNITY): Payer: BLUE CROSS/BLUE SHIELD | Admitting: Anesthesiology

## 2015-01-27 ENCOUNTER — Encounter (HOSPITAL_COMMUNITY)
Admission: RE | Disposition: A | Discharge status: Home / Self Care | Payer: Self-pay | Source: Ambulatory Visit | Attending: Surgery

## 2015-01-27 ENCOUNTER — Encounter (HOSPITAL_COMMUNITY): Payer: Self-pay | Admitting: *Deleted

## 2015-01-27 ENCOUNTER — Inpatient Hospital Stay (HOSPITAL_COMMUNITY)
Admission: RE | Admit: 2015-01-27 | Discharge: 2015-01-29 | DRG: 620 | Disposition: A | Discharge status: Home / Self Care | Payer: BLUE CROSS/BLUE SHIELD | Source: Ambulatory Visit | Attending: Surgery | Admitting: Surgery

## 2015-01-27 DIAGNOSIS — I5022 Chronic systolic (congestive) heart failure: Secondary | ICD-10-CM | POA: Diagnosis present

## 2015-01-27 DIAGNOSIS — Z7982 Long term (current) use of aspirin: Secondary | ICD-10-CM

## 2015-01-27 DIAGNOSIS — Z9884 Bariatric surgery status: Secondary | ICD-10-CM

## 2015-01-27 DIAGNOSIS — J45909 Unspecified asthma, uncomplicated: Secondary | ICD-10-CM | POA: Diagnosis present

## 2015-01-27 DIAGNOSIS — I10 Essential (primary) hypertension: Secondary | ICD-10-CM | POA: Diagnosis present

## 2015-01-27 DIAGNOSIS — Z87891 Personal history of nicotine dependence: Secondary | ICD-10-CM

## 2015-01-27 DIAGNOSIS — G4733 Obstructive sleep apnea (adult) (pediatric): Secondary | ICD-10-CM | POA: Diagnosis present

## 2015-01-27 DIAGNOSIS — Z6841 Body Mass Index (BMI) 40.0 and over, adult: Secondary | ICD-10-CM

## 2015-01-27 DIAGNOSIS — Z7952 Long term (current) use of systemic steroids: Secondary | ICD-10-CM

## 2015-01-27 DIAGNOSIS — E785 Hyperlipidemia, unspecified: Secondary | ICD-10-CM | POA: Diagnosis present

## 2015-01-27 HISTORY — PX: LAPAROSCOPIC GASTRIC SLEEVE RESECTION: SHX5895

## 2015-01-27 LAB — HEMOGLOBIN AND HEMATOCRIT, BLOOD
HCT: 40.9 % (ref 39.0–52.0)
Hemoglobin: 13.4 g/dL (ref 13.0–17.0)

## 2015-01-27 LAB — MRSA PCR SCREENING: MRSA BY PCR: NEGATIVE

## 2015-01-27 SURGERY — GASTRECTOMY, SLEEVE, LAPAROSCOPIC
Anesthesia: General | Site: Abdomen

## 2015-01-27 MED ORDER — NEOSTIGMINE METHYLSULFATE 10 MG/10ML IV SOLN
INTRAVENOUS | Status: AC
  Filled 2015-01-27: qty 1

## 2015-01-27 MED ORDER — PROPOFOL 10 MG/ML IV BOLUS
INTRAVENOUS | Status: DC | PRN
  Administered 2015-01-27: 250 mg via INTRAVENOUS

## 2015-01-27 MED ORDER — DEXAMETHASONE SODIUM PHOSPHATE 10 MG/ML IJ SOLN
INTRAMUSCULAR | Status: AC
  Filled 2015-01-27: qty 1

## 2015-01-27 MED ORDER — FENTANYL CITRATE (PF) 250 MCG/5ML IJ SOLN
INTRAMUSCULAR | Status: AC
  Filled 2015-01-27: qty 5

## 2015-01-27 MED ORDER — NALOXONE HCL 0.4 MG/ML IJ SOLN
INTRAMUSCULAR | Status: AC
  Filled 2015-01-27: qty 1

## 2015-01-27 MED ORDER — GLYCOPYRROLATE 0.2 MG/ML IJ SOLN
INTRAMUSCULAR | Status: AC
  Filled 2015-01-27: qty 4

## 2015-01-27 MED ORDER — ALBUTEROL SULFATE (2.5 MG/3ML) 0.083% IN NEBU: 3.0000 mL | INHALATION_SOLUTION | RESPIRATORY_TRACT | Status: DC | PRN

## 2015-01-27 MED ORDER — DEXTROSE 5 % IV SOLN
INTRAVENOUS | Status: AC
  Filled 2015-01-27: qty 2

## 2015-01-27 MED ORDER — ONDANSETRON HCL 4 MG/2ML IJ SOLN: 4.0000 mg | INTRAMUSCULAR | Status: DC | PRN

## 2015-01-27 MED ORDER — MIDAZOLAM HCL 2 MG/2ML IJ SOLN
INTRAMUSCULAR | Status: AC
  Filled 2015-01-27: qty 2

## 2015-01-27 MED ORDER — LACTATED RINGERS IV SOLN
INTRAVENOUS | Status: DC | PRN
  Administered 2015-01-27 (×2): via INTRAVENOUS

## 2015-01-27 MED ORDER — PANTOPRAZOLE SODIUM 40 MG IV SOLR
40.0000 mg | Freq: Every day | INTRAVENOUS | Status: DC
  Administered 2015-01-27 – 2015-01-28 (×2): 40 mg via INTRAVENOUS
  Filled 2015-01-27 (×3): qty 40

## 2015-01-27 MED ORDER — NEOSTIGMINE METHYLSULFATE 10 MG/10ML IV SOLN
INTRAVENOUS | Status: DC | PRN
  Administered 2015-01-27: 5 mg via INTRAVENOUS

## 2015-01-27 MED ORDER — 0.9 % SODIUM CHLORIDE (POUR BTL) OPTIME
TOPICAL | Status: DC | PRN
  Administered 2015-01-27: 1000 mL

## 2015-01-27 MED ORDER — GLYCOPYRROLATE 0.2 MG/ML IJ SOLN
INTRAMUSCULAR | Status: DC | PRN
  Administered 2015-01-27: .8 mg via INTRAVENOUS

## 2015-01-27 MED ORDER — METOPROLOL TARTRATE 1 MG/ML IV SOLN
5.0000 mg | Freq: Four times a day (QID) | INTRAVENOUS | Status: DC
  Administered 2015-01-28 (×3): 5 mg via INTRAVENOUS
  Filled 2015-01-27 (×3): qty 5

## 2015-01-27 MED ORDER — CHLORHEXIDINE GLUCONATE CLOTH 2 % EX PADS: 6.0000 | MEDICATED_PAD | Freq: Once | CUTANEOUS | Status: DC

## 2015-01-27 MED ORDER — DEXAMETHASONE SODIUM PHOSPHATE 10 MG/ML IJ SOLN
INTRAMUSCULAR | Status: DC | PRN
  Administered 2015-01-27: 10 mg via INTRAVENOUS

## 2015-01-27 MED ORDER — PROPOFOL 10 MG/ML IV BOLUS
INTRAVENOUS | Status: AC
  Filled 2015-01-27: qty 20

## 2015-01-27 MED ORDER — UNJURY CHOCOLATE CLASSIC POWDER: 2.0000 [oz_av] | Freq: Four times a day (QID) | ORAL | Status: DC

## 2015-01-27 MED ORDER — SODIUM CHLORIDE 0.9 % IJ SOLN
INTRAMUSCULAR | Status: AC
  Filled 2015-01-27: qty 10

## 2015-01-27 MED ORDER — ALBUTEROL SULFATE (2.5 MG/3ML) 0.083% IN NEBU: 2.5000 mg | INHALATION_SOLUTION | Freq: Four times a day (QID) | RESPIRATORY_TRACT | Status: DC | PRN

## 2015-01-27 MED ORDER — SODIUM CHLORIDE 0.9 % IV SOLN
10.0000 mg | INTRAVENOUS | Status: DC | PRN
  Administered 2015-01-27: 15 ug/min via INTRAVENOUS

## 2015-01-27 MED ORDER — MIDAZOLAM HCL 5 MG/5ML IJ SOLN
INTRAMUSCULAR | Status: DC | PRN
  Administered 2015-01-27: 2 mg via INTRAVENOUS

## 2015-01-27 MED ORDER — CARVEDILOL 25 MG PO TABS
25.0000 mg | ORAL_TABLET | Freq: Two times a day (BID) | ORAL | Status: DC
  Administered 2015-01-28 – 2015-01-29 (×2): 25 mg via ORAL
  Filled 2015-01-27 (×4): qty 1

## 2015-01-27 MED ORDER — PHENYLEPHRINE HCL 10 MG/ML IJ SOLN
INTRAMUSCULAR | Status: AC
  Filled 2015-01-27: qty 1

## 2015-01-27 MED ORDER — LACTATED RINGERS IR SOLN
Status: DC | PRN
  Administered 2015-01-27: 1000 mL

## 2015-01-27 MED ORDER — SUCCINYLCHOLINE CHLORIDE 20 MG/ML IJ SOLN
INTRAMUSCULAR | Status: DC | PRN
  Administered 2015-01-27: 140 mg via INTRAVENOUS

## 2015-01-27 MED ORDER — HEPARIN SODIUM (PORCINE) 5000 UNIT/ML IJ SOLN
5000.0000 [IU] | Freq: Three times a day (TID) | INTRAMUSCULAR | Status: DC
  Administered 2015-01-27 – 2015-01-29 (×5): 5000 [IU] via SUBCUTANEOUS
  Filled 2015-01-27 (×8): qty 1

## 2015-01-27 MED ORDER — VITAMINS A & D EX OINT
TOPICAL_OINTMENT | CUTANEOUS | Status: AC
  Administered 2015-01-27: 21:00:00
  Filled 2015-01-27: qty 5

## 2015-01-27 MED ORDER — LACTATED RINGERS IV SOLN
INTRAVENOUS | Status: DC | PRN
  Administered 2015-01-27: 10:00:00 via INTRAVENOUS

## 2015-01-27 MED ORDER — FENTANYL CITRATE (PF) 100 MCG/2ML IJ SOLN
INTRAMUSCULAR | Status: AC
  Filled 2015-01-27: qty 2

## 2015-01-27 MED ORDER — CARVEDILOL 12.5 MG PO TABS
25.0000 mg | ORAL_TABLET | Freq: Once | ORAL | Status: AC
  Administered 2015-01-27: 25 mg via ORAL
  Filled 2015-01-27: qty 2

## 2015-01-27 MED ORDER — FENTANYL CITRATE (PF) 100 MCG/2ML IJ SOLN
INTRAMUSCULAR | Status: DC | PRN
  Administered 2015-01-27: 25 ug via INTRAVENOUS
  Administered 2015-01-27: 125 ug via INTRAVENOUS
  Administered 2015-01-27: 50 ug via INTRAVENOUS
  Administered 2015-01-27: 25 ug via INTRAVENOUS
  Administered 2015-01-27: 50 ug via INTRAVENOUS
  Administered 2015-01-27: 25 ug via INTRAVENOUS

## 2015-01-27 MED ORDER — BUPIVACAINE LIPOSOME 1.3 % IJ SUSP
20.0000 mL | Freq: Once | INTRAMUSCULAR | Status: AC
  Administered 2015-01-27: 266 mg
  Administered 2015-01-27: 20 mL
  Filled 2015-01-27: qty 20

## 2015-01-27 MED ORDER — METOCLOPRAMIDE HCL 5 MG/ML IJ SOLN
INTRAMUSCULAR | Status: AC
  Filled 2015-01-27: qty 2

## 2015-01-27 MED ORDER — CARVEDILOL 12.5 MG PO TABS: 25.0000 mg | ORAL_TABLET | Freq: Two times a day (BID) | ORAL | Status: DC

## 2015-01-27 MED ORDER — HEPARIN SODIUM (PORCINE) 5000 UNIT/ML IJ SOLN
5000.0000 [IU] | INTRAMUSCULAR | Status: AC
  Administered 2015-01-27: 5000 [IU] via SUBCUTANEOUS
  Filled 2015-01-27: qty 1

## 2015-01-27 MED ORDER — OXYCODONE HCL 5 MG/5ML PO SOLN: 5.0000 mg | ORAL | Status: DC | PRN

## 2015-01-27 MED ORDER — ACETAMINOPHEN 160 MG/5ML PO SOLN: 325.0000 mg | ORAL | Status: DC | PRN

## 2015-01-27 MED ORDER — ROCURONIUM BROMIDE 100 MG/10ML IV SOLN
INTRAVENOUS | Status: AC
  Filled 2015-01-27: qty 1

## 2015-01-27 MED ORDER — METOCLOPRAMIDE HCL 5 MG/ML IJ SOLN
INTRAMUSCULAR | Status: DC | PRN
  Administered 2015-01-27: 10 mg via INTRAVENOUS

## 2015-01-27 MED ORDER — AMLODIPINE BESYLATE 10 MG PO TABS
10.0000 mg | ORAL_TABLET | Freq: Every day | ORAL | Status: DC
  Administered 2015-01-28 – 2015-01-29 (×2): 10 mg via ORAL
  Filled 2015-01-27 (×2): qty 1

## 2015-01-27 MED ORDER — ROCURONIUM BROMIDE 100 MG/10ML IV SOLN
INTRAVENOUS | Status: DC | PRN
  Administered 2015-01-27 (×2): 30 mg via INTRAVENOUS
  Administered 2015-01-27: 20 mg via INTRAVENOUS

## 2015-01-27 MED ORDER — ACETAMINOPHEN 160 MG/5ML PO SOLN: 650.0000 mg | ORAL | Status: DC | PRN

## 2015-01-27 MED ORDER — SPIRONOLACTONE 50 MG PO TABS
50.0000 mg | ORAL_TABLET | Freq: Every day | ORAL | Status: DC
  Administered 2015-01-27 – 2015-01-29 (×3): 50 mg via ORAL
  Filled 2015-01-27: qty 2
  Filled 2015-01-27: qty 1
  Filled 2015-01-27: qty 2

## 2015-01-27 MED ORDER — KCL IN DEXTROSE-NACL 20-5-0.45 MEQ/L-%-% IV SOLN
INTRAVENOUS | Status: DC
  Administered 2015-01-27 – 2015-01-29 (×4): via INTRAVENOUS
  Filled 2015-01-27 (×5): qty 1000

## 2015-01-27 MED ORDER — UNJURY CHICKEN SOUP POWDER
2.0000 [oz_av] | Freq: Four times a day (QID) | ORAL | Status: DC
Start: 2015-01-29 — End: 2015-01-29

## 2015-01-27 MED ORDER — LIDOCAINE HCL (CARDIAC) 20 MG/ML IV SOLN
INTRAVENOUS | Status: AC
  Filled 2015-01-27: qty 5

## 2015-01-27 MED ORDER — ONDANSETRON HCL 4 MG/2ML IJ SOLN
INTRAMUSCULAR | Status: DC | PRN
  Administered 2015-01-27: 4 mg via INTRAVENOUS

## 2015-01-27 MED ORDER — NALOXONE HCL 0.4 MG/ML IJ SOLN
INTRAMUSCULAR | Status: DC | PRN
  Administered 2015-01-27: 8 ug via INTRAVENOUS

## 2015-01-27 MED ORDER — LIDOCAINE HCL (CARDIAC) 20 MG/ML IV SOLN
INTRAVENOUS | Status: DC | PRN
  Administered 2015-01-27: 60 mg via INTRAVENOUS

## 2015-01-27 MED ORDER — UNJURY VANILLA POWDER
2.0000 [oz_av] | Freq: Four times a day (QID) | ORAL | Status: DC
  Administered 2015-01-29: 2 [oz_av] via ORAL

## 2015-01-27 MED ORDER — MORPHINE SULFATE 2 MG/ML IJ SOLN
2.0000 mg | INTRAMUSCULAR | Status: DC | PRN
  Administered 2015-01-27 – 2015-01-28 (×2): 2 mg via INTRAVENOUS
  Filled 2015-01-27 (×2): qty 1

## 2015-01-27 MED ORDER — ENALAPRILAT 1.25 MG/ML IV SOLN
0.6250 mg | Freq: Four times a day (QID) | INTRAVENOUS | Status: DC | PRN
  Administered 2015-01-28 – 2015-01-29 (×3): 0.625 mg via INTRAVENOUS
  Filled 2015-01-27 (×5): qty 0.5

## 2015-01-27 MED ORDER — NITROGLYCERIN 0.4 MG SL SUBL: 0.4000 mg | SUBLINGUAL_TABLET | SUBLINGUAL | Status: DC | PRN

## 2015-01-27 MED ORDER — DEXTROSE 5 % IV SOLN
2.0000 g | INTRAVENOUS | Status: AC
  Administered 2015-01-27: 2 g via INTRAVENOUS

## 2015-01-27 MED ORDER — FENTANYL CITRATE (PF) 100 MCG/2ML IJ SOLN
25.0000 ug | INTRAMUSCULAR | Status: DC | PRN
  Administered 2015-01-27: 25 ug via INTRAVENOUS

## 2015-01-27 MED ORDER — ONDANSETRON HCL 4 MG/2ML IJ SOLN
INTRAMUSCULAR | Status: AC
  Filled 2015-01-27: qty 2

## 2015-01-27 SURGICAL SUPPLY — 64 items
APL SRG 32X5 SNPLK LF DISP (MISCELLANEOUS)
APPLICATOR COTTON TIP 6IN STRL (MISCELLANEOUS) IMPLANT
APPLIER CLIP 5 13 M/L LIGAMAX5 (MISCELLANEOUS)
APPLIER CLIP ROT 10 11.4 M/L (STAPLE)
APPLIER CLIP ROT 13.4 12 LRG (CLIP)
APR CLP LRG 13.4X12 ROT 20 MLT (CLIP)
APR CLP MED LRG 11.4X10 (STAPLE)
APR CLP MED LRG 5 ANG JAW (MISCELLANEOUS)
BLADE SURG 15 STRL LF DISP TIS (BLADE) ×1 IMPLANT
BLADE SURG 15 STRL SS (BLADE) ×2
CABLE HIGH FREQUENCY MONO STRZ (ELECTRODE) IMPLANT
CLIP APPLIE 5 13 M/L LIGAMAX5 (MISCELLANEOUS) IMPLANT
CLIP APPLIE ROT 10 11.4 M/L (STAPLE) IMPLANT
CLIP APPLIE ROT 13.4 12 LRG (CLIP) IMPLANT
DEVICE SUT QUICK LOAD TK 5 (STAPLE) IMPLANT
DEVICE SUT TI-KNOT TK 5X26 (MISCELLANEOUS) IMPLANT
DEVICE SUTURE ENDOST 10MM (ENDOMECHANICALS) IMPLANT
DEVICE TROCAR PUNCTURE CLOSURE (ENDOMECHANICALS) ×2 IMPLANT
DISSECTOR BLUNT TIP ENDO 5MM (MISCELLANEOUS) ×2 IMPLANT
DRAPE CAMERA CLOSED 9X96 (DRAPES) ×1 IMPLANT
ELECT REM PT RETURN 9FT ADLT (ELECTROSURGICAL) ×2
ELECTRODE REM PT RTRN 9FT ADLT (ELECTROSURGICAL) ×1 IMPLANT
GAUZE SPONGE 4X4 12PLY STRL (GAUZE/BANDAGES/DRESSINGS) IMPLANT
GLOVE BIOGEL M 8.0 STRL (GLOVE) ×2 IMPLANT
GOWN STRL REUS W/TWL XL LVL3 (GOWN DISPOSABLE) ×8 IMPLANT
HANDLE STAPLE EGIA 4 XL (STAPLE) ×2 IMPLANT
HOVERMATT SINGLE USE (MISCELLANEOUS) ×2 IMPLANT
KIT BASIN OR (CUSTOM PROCEDURE TRAY) ×2 IMPLANT
LIQUID BAND (GAUZE/BANDAGES/DRESSINGS) IMPLANT
NDL SPNL 22GX3.5 QUINCKE BK (NEEDLE) ×1 IMPLANT
NEEDLE SPNL 22GX3.5 QUINCKE BK (NEEDLE) ×2 IMPLANT
PACK UNIVERSAL I (CUSTOM PROCEDURE TRAY) ×2 IMPLANT
PEN SKIN MARKING BROAD (MISCELLANEOUS) ×2 IMPLANT
RELOAD STAPLE 45 PURP MED/THCK (STAPLE) IMPLANT
RELOAD TRI 45 ART MED THCK BLK (STAPLE) ×2 IMPLANT
RELOAD TRI 45 ART MED THCK PUR (STAPLE) IMPLANT
RELOAD TRI 60 ART MED THCK BLK (STAPLE) ×5 IMPLANT
RELOAD TRI 60 ART MED THCK PUR (STAPLE) ×2 IMPLANT
SCISSORS LAP 5X45 EPIX DISP (ENDOMECHANICALS) IMPLANT
SCRUB PCMX 4 OZ (MISCELLANEOUS) ×4 IMPLANT
SEALANT SURGICAL APPL DUAL CAN (MISCELLANEOUS) IMPLANT
SET IRRIG TUBING LAPAROSCOPIC (IRRIGATION / IRRIGATOR) ×2 IMPLANT
SHEARS CURVED HARMONIC AC 45CM (MISCELLANEOUS) ×2 IMPLANT
SLEEVE ADV FIXATION 5X100MM (TROCAR) ×4 IMPLANT
SLEEVE GASTRECTOMY 36FR VISIGI (MISCELLANEOUS) ×2 IMPLANT
SOLUTION ANTI FOG 6CC (MISCELLANEOUS) ×2 IMPLANT
SPONGE LAP 18X18 X RAY DECT (DISPOSABLE) ×2 IMPLANT
STAPLER VISISTAT 35W (STAPLE) ×2 IMPLANT
SUT SURGIDAC NAB ES-9 0 48 120 (SUTURE) IMPLANT
SUT VIC AB 4-0 SH 18 (SUTURE) ×2 IMPLANT
SUT VICRYL 0 TIES 12 18 (SUTURE) ×2 IMPLANT
SYR 20CC LL (SYRINGE) ×2 IMPLANT
SYR 50ML LL SCALE MARK (SYRINGE) ×2 IMPLANT
TOWEL OR 17X26 10 PK STRL BLUE (TOWEL DISPOSABLE) ×4 IMPLANT
TOWEL OR NON WOVEN STRL DISP B (DISPOSABLE) ×2 IMPLANT
TRAY FOLEY W/METER SILVER 14FR (SET/KITS/TRAYS/PACK) IMPLANT
TROCAR ADV FIXATION 12X100MM (TROCAR) ×2 IMPLANT
TROCAR ADV FIXATION 5X100MM (TROCAR) ×2 IMPLANT
TROCAR BLADELESS 15MM (ENDOMECHANICALS) ×2 IMPLANT
TROCAR BLADELESS OPT 5 100 (ENDOMECHANICALS) ×2 IMPLANT
TUBE CALIBRATION LAPBAND (TUBING) IMPLANT
TUBING CONNECTING 10 (TUBING) ×2 IMPLANT
TUBING ENDO SMARTCAP (MISCELLANEOUS) ×2 IMPLANT
TUBING FILTER THERMOFLATOR (ELECTROSURGICAL) ×2 IMPLANT

## 2015-01-27 NOTE — Anesthesia Procedure Notes (Signed)
Procedure Name: Intubation Date/Time: 01/27/2015 7:26 AM Performed by: Durward ParcelFLYNN-COOK, Britanie Harshman A Pre-anesthesia Checklist: Patient identified, Emergency Drugs available, Suction available, Patient being monitored and Timeout performed Patient Re-evaluated:Patient Re-evaluated prior to inductionOxygen Delivery Method: Circle system utilized Preoxygenation: Pre-oxygenation with 100% oxygen Intubation Type: IV induction Ventilation: Mask ventilation without difficulty Laryngoscope Size: Mac and 4 Grade View: Grade II Tube type: Oral Laser Tube: Cuffed inflated with minimal occlusive pressure - saline Tube size: 8.0 mm Number of attempts: 1 Airway Equipment and Method: Patient positioned with wedge pillow and Stylet Placement Confirmation: ETT inserted through vocal cords under direct vision,  breath sounds checked- equal and bilateral and positive ETCO2 Secured at: 23 cm Dental Injury: Teeth and Oropharynx as per pre-operative assessment

## 2015-01-27 NOTE — Op Note (Signed)
Donnetta SimpersGary T Rodin [id number] July 16, 1981 01/27/2015  Preoperative diagnosis: Morbid obesity  Postoperative diagnosis: Same   Procedure: upper endoscopy  Surgeon: Mary SellaEric M. Kacen Mellinger M.D., FACS   Anesthesia: Gen.   Indications for procedure: 34 year old AAM undergoing Laparoscopic Gastric Sleeve Resection and an EGD was requested to evaluate the new gastric sleeve.   Description of procedure: After we have completed the sleeve resection, I scrubbed out and obtained the Olympus endoscope. I gently placed endoscope in the patient's oropharynx and gently glided it down the esophagus without any difficulty under direct visualization. Once I was in the gastric sleeve, I insufflated the stomach with air. I was able to cannulate and advanced the scope through the gastric sleeve. I was able to cannulate the duodenum with ease. Dr. Daphine DeutscherMartin had placed saline in the upper abdomen. Upon further insufflation of the gastric sleeve there was no evidence of bubbles. GE junction located at 54 cm.  Upon further inspection of the gastric sleeve, the mucosa appeared normal. There is no evidence of any mucosal abnormality. The sleeve was widely patent at the angularis. There was no evidence of bleeding. The gastric sleeve was decompressed. The scope was withdrawn. The patient tolerated this portion of the procedure well. Please see Dr Ermalene SearingMartin's operative note for details regarding the laparoscopic gastric sleeve resection.    Mary SellaEric M. Andrey CampanileWilson, MD, FACS  General, Bariatric, & Minimally Invasive Surgery  [facility] [place]

## 2015-01-27 NOTE — Op Note (Signed)
Surgeon: Leonard LowMatt Korah Hufstedler, MD, FACS  Asst:  [name], MD, FACS  Anes:  General endotracheal  Procedure: Laparoscopic sleeve gastrectomy and upper endoscopy  Diagnosis: Morbid obesity BMI >60  Complications: none  EBL:   15 cc  Description of Procedure:  The patient was take to OR 1 and given general anesthesia.  The abdomen was prepped with PCMX and draped sterilely.  A timeout was performed.  Access to the abdomen was achieved with a 5 mm Optiview through the left upper quadrant.  Following insufflation, the state of the abdomen was found to be free of adhesions.  No hiatus hernia dimple was seen and the UGI showed no hiatus hernia.    The ViSiGi 36Fr tube was inserted to deflate the stomach and was pulled back into the esophagus.    The pylorus was identified and we measured 5 cm back and marked the antrum.  At that point we began dissection to take down the greater curvature of the stomach using the Harmonic scalpel.  This dissection was taken all the way up to the left crus.  Posterior attachments of the stomach were also taken down.    The ViSiGi tube was then passed into the antrum and suction applied so that it was snug along the lessor curvature.  The "crow's foot" or incisura was identified.  The sleeve gastrectomy was begun using the [employer] InternationalCovidien platform stapler beginning with a 4.5 cm black with TRS followed by multiple applications of the Covidien black load with TRS buttress material.  When the sleeve was complete the tube was taken off suction and insufflated briefly.  The tube was withdrawn.  Upper endoscopy was then performed by Dr. Andrey CampanileWilson which showed no bleeding or leaks..     The specimen was extracted through the 15 trocar site.  Wounds were infiltrated with Exaparel  and closed with 4-0 Vicryl.  The 15mm port was closed with the 0 vicryl and the endoclose.    Matt B. Daphine DeutscherMartin, MD, [facility], [place] [phone]

## 2015-01-27 NOTE — H&P (View-Only) (Signed)
Chief Complaint:  Morbid obesity BMI 62  History of Present Illness:  Leonard Lewis is an 34 y.o. male who has been worked up for sleeve gastrectomy.  UGI was negative for hiatus hernia.  Ultrasound was negative.    Past Medical History  Diagnosis Date  . Obstructive sleep apnea   . Obesity, morbid   . Hypertension   . CHF (congestive heart failure) 2008    EF 40-50%  . Asthma     Past Surgical History  Procedure Laterality Date  . Cardiac catheterization  Oct 2013    cardiac cath negative for obstructive disease -- did show end diastolic pressure secondary to systemic hypertension  . Left heart catheterization with coronary angiogram N/A 08/03/2012    Procedure: LEFT HEART CATHETERIZATION WITH CORONARY ANGIOGRAM;  Surgeon: Tonny BollmanMichael Cooper, MD;  [facility];  Service: Cardiovascular;  Laterality: N/A;    Current Outpatient Prescriptions  Medication Sig Dispense Refill  . albuterol (PROVENTIL HFA;VENTOLIN HFA) 108 (90 BASE) MCG/ACT inhaler Inhale 2 puffs into the lungs every 4 (four) hours as needed for wheezing. 1 Inhaler 0  . albuterol (PROVENTIL) (2.5 MG/3ML) 0.083% nebulizer solution Take 3 mLs (2.5 mg total) by nebulization every 6 (six) hours as needed for wheezing or shortness of breath. 150 mL 1  . amLODipine (NORVASC) 10 MG tablet TAKE 1 TABLET (10 MG TOTAL) BY MOUTH DAILY. 90 tablet 1  . aspirin EC 81 MG tablet Take 81 mg by mouth daily.    Marland Kitchen. azithromycin (ZITHROMAX) 250 MG tablet Take 1 tablet (250 mg total) by mouth daily. Take first 2 tablets together, then 1 every day until finished. 6 tablet 0  . carvedilol (COREG) 25 MG tablet     . ipratropium (ATROVENT) 0.06 % nasal spray Place 2 sprays into both nostrils 4 (four) times daily. 15 mL 1  . lisinopril (PRINIVIL,ZESTRIL) 40 MG tablet     . meloxicam (MOBIC) 15 MG tablet Take 1 tablet (15 mg total) by mouth daily. 30 tablet 0  . nitroGLYCERIN (NITROSTAT) 0.4 MG SL tablet Place 1 tablet (0.4 mg total) under the  tongue every 5 (five) minutes as needed for chest pain. 15 tablet 1  . predniSONE (DELTASONE) 10 MG tablet Take 3 tablets (30 mg total) by mouth daily. 15 tablet 0  . spironolactone (ALDACTONE) 50 MG tablet Take 50 mg by mouth daily.     No current facility-administered medications for this visit.   Review of patient's allergies indicates no known allergies. Family History  Problem Relation Age of Onset  . Hypertension Mother   . Hypertension Father    Social History:   reports that he quit smoking about 8 years ago. His smoking use included Cigarettes. He does not have any smokeless tobacco history on file. He reports that he does not drink alcohol or use illicit drugs.   REVIEW OF SYSTEMS : Negative except for see problem list  Physical Exam:   There were no vitals taken for this visit. There is no weight on file to calculate BMI.  Gen:  WDWN AAM NAD  Neurological: Alert and oriented to person, place, and time. Motor and sensory function is grossly intact  Head: Normocephalic and atraumatic.  Eyes: Conjunctivae are normal. Pupils are equal, round, and reactive to light. No scleral icterus.  Neck: Normal range of motion. Neck supple. No tracheal deviation or thyromegaly present.  Cardiovascular:  SR without murmurs or gallops.  No carotid bruits Breast:  Not examined Respiratory: Effort  normal.  No respiratory distress. No chest wall tenderness. Breath sounds normal.  No wheezes, rales or rhonchi.  Abdomen:  Grossly obese and nontender GU:  Male anatomy Musculoskeletal: Normal range of motion. Extremities are nontender. No cyanosis, edema or clubbing noted Lymphadenopathy: No cervical, preauricular, postauricular or axillary adenopathy is present Skin: Skin is warm and dry. No rash noted. No diaphoresis. No erythema. No pallor. Pscyh: Normal mood and affect. Behavior is normal. Judgment and thought content normal.   LABORATORY RESULTS: No results found for this or any previous  visit (from the past 48 hour(s)).   RADIOLOGY RESULTS: No results found.  Problem List: Patient Active Problem List   Diagnosis Date Noted  . Chronic systolic heart failure 10/30/2013  . Chest pain 06/20/2012  . Hyperglycemia 10/26/2011  . OBSTRUCTIVE SLEEP APNEA 05/31/2007  . HYPERTENSION, BENIGN ESSENTIAL 05/02/2007  . HYPERLIPIDEMIA 03/17/2007  . Morbid obesity 03/17/2007    Assessment & Plan: Morbid obesity BMI 62 for sleeve gastrectomy    Matt B. Daphine Deutscher, MD, [facility] [phone] beeper [phone]  01/08/2015 10:12 AM

## 2015-01-27 NOTE — Care Management (Signed)
CARE MANAGEMENT NOTE 01/27/2015  Patient:  Leonard Lewis,Leonard Lewis   Account Number:  [id number]  Date Initiated:  01/27/2015  Documentation initiated by:  DAVIS,RHONDA  Subjective/Objective Assessment:   LAPAROSCOPIC GASTRIC SLEEVE RESECTION WITH UPPER ENDOSCOPY     Action/Plan:   home when stable   Anticipated DC Date:  01/30/2015   Anticipated DC Plan:  HOME/SELF CARE  In-house referral  NA      DC Planning Services  CM consult      PAC Choice  NA   Choice offered to / List presented to:        DME agency  NA        [facility]  NA   Status of service:  In process, will continue to follow Medicare Important Message given?   (If response is "NO", the following Medicare IM given date fields will be blank) Date Medicare IM given:   Medicare IM given by:   Date Additional Medicare IM given:   Additional Medicare IM given by:    Discharge Disposition:    Per UR Regulation:  Reviewed for med. necessity/level of care/duration of stay  If discussed at Long Length of Stay Meetings, dates discussed:    Comments:  January 27, 2015/Rhonda L. Earlene Plateravis, RN, BSN, CCM. Case Management [employer] [phone] No discharge needs present of time of review.

## 2015-01-27 NOTE — Interval H&P Note (Signed)
History and Physical Interval Note:  01/27/2015 7:05 AM  Leonard Lewis  has presented today for surgery, with the diagnosis of MORBID OBESITY  The various methods of treatment have been discussed with the patient and family. After consideration of risks, benefits and other options for treatment, the patient has consented to  Procedure(s): LAPAROSCOPIC GASTRIC SLEEVE RESECTION (N/A) as a surgical intervention .  The patient's history has been reviewed, patient examined, no change in status, stable for surgery. Has been cleared for surgery by Dr. Eden EmmsNishan.   I have reviewed the patient's chart and labs.  Questions were answered to the patient's satisfaction.     Dane Bloch B

## 2015-01-27 NOTE — Anesthesia Postprocedure Evaluation (Signed)
  Anesthesia Post-op Note  Patient: Leonard Lewis  Procedure(s) Performed: Procedure(s): LAPAROSCOPIC GASTRIC SLEEVE RESECTION WITH UPPER ENDOSCOPY (N/A)  Patient Location: PACU  Anesthesia Type:General  Level of Consciousness: awake  Airway and Oxygen Therapy: Patient Spontanous Breathing and Patient connected to nasal cannula oxygen  Post-op Pain: mild  Post-op Assessment: Post-op Vital signs reviewed, Patient's Cardiovascular Status Stable, Respiratory Function Stable, Patent Airway, No signs of Nausea or vomiting and Pain level controlled  Post-op Vital Signs: Reviewed and stable  Last Vitals:  Filed Vitals:   01/27/15 1200  BP:   Pulse: 86  Temp:   Resp: 22    Complications: No apparent anesthesia complications

## 2015-01-27 NOTE — Anesthesia Preprocedure Evaluation (Signed)
Anesthesia Evaluation  Patient identified by MRN, date of birth, ID band Patient awake    Reviewed: Allergy & Precautions, NPO status , Patient's Chart, lab work & pertinent test results  History of Anesthesia Complications Negative for: history of anesthetic complications  Airway Mallampati: III  TM Distance: >3 FB Neck ROM: Full    Dental  (+) Teeth Intact   Pulmonary shortness of breath and at rest, asthma , former smoker,  breath sounds clear to auscultation        Cardiovascular hypertension, Pt. on medications and Pt. on home beta blockers +CHF Rhythm:Regular     Neuro/Psych negative neurological ROS  negative psych ROS   GI/Hepatic negative GI ROS, Neg liver ROS,   Endo/Other  Morbid obesity  Renal/GU negative Renal ROS     Musculoskeletal  (+) Arthritis -,   Abdominal   Peds  Hematology negative hematology ROS (+)   Anesthesia Other Findings   Reproductive/Obstetrics                             Anesthesia Physical Anesthesia Plan  ASA: III  Anesthesia Plan: General   Post-op Pain Management:    Induction: Intravenous  Airway Management Planned: Oral ETT  Additional Equipment: None  Intra-op Plan:   Post-operative Plan: Extubation in OR  Informed Consent: I have reviewed the patients History and Physical, chart, labs and discussed the procedure including the risks, benefits and alternatives for the proposed anesthesia with the patient or authorized representative who has indicated his/her understanding and acceptance.   Dental advisory given  Plan Discussed with: CRNA and Surgeon  Anesthesia Plan Comments:         Anesthesia Quick Evaluation

## 2015-01-27 NOTE — Transfer of Care (Signed)
Immediate Anesthesia Transfer of Care Note  Patient: Leonard Lewis  Procedure(s) Performed: Procedure(s): LAPAROSCOPIC GASTRIC SLEEVE RESECTION (N/A)  Patient Location: PACU  Anesthesia Type:General  Level of Consciousness: awake, oriented and patient cooperative  Airway & Oxygen Therapy: Patient Spontanous Breathing and Patient connected to face mask oxygen  Post-op Assessment: Report given to RN and Post -op Vital signs reviewed and stable  Post vital signs: Reviewed and stable  Last Vitals:  Filed Vitals:   01/27/15 1009  BP: 158/101  Pulse: 80  Temp:   Resp: 10    Complications: No apparent anesthesia complications

## 2015-01-28 ENCOUNTER — Encounter (HOSPITAL_COMMUNITY): Payer: Self-pay | Admitting: Surgery

## 2015-01-28 ENCOUNTER — Inpatient Hospital Stay (HOSPITAL_COMMUNITY): Payer: BLUE CROSS/BLUE SHIELD

## 2015-01-28 DIAGNOSIS — Z9884 Bariatric surgery status: Secondary | ICD-10-CM

## 2015-01-28 LAB — CBC WITH DIFFERENTIAL/PLATELET
Basophils Absolute: 0 10*3/uL (ref 0.0–0.1)
Basophils Relative: 0 % (ref 0–1)
EOS ABS: 0 10*3/uL (ref 0.0–0.7)
EOS PCT: 0 % (ref 0–5)
HCT: 38.9 % — ABNORMAL LOW (ref 39.0–52.0)
HEMOGLOBIN: 12.3 g/dL — AB (ref 13.0–17.0)
LYMPHS ABS: 1.6 10*3/uL (ref 0.7–4.0)
Lymphocytes Relative: 14 % (ref 12–46)
MCH: 22 pg — ABNORMAL LOW (ref 26.0–34.0)
MCHC: 31.6 g/dL (ref 30.0–36.0)
MCV: 69.5 fL — AB (ref 78.0–100.0)
MONO ABS: 0.8 10*3/uL (ref 0.1–1.0)
MONOS PCT: 7 % (ref 3–12)
NEUTROS PCT: 79 % — AB (ref 43–77)
Neutro Abs: 9 10*3/uL — ABNORMAL HIGH (ref 1.7–7.7)
PLATELETS: 309 10*3/uL (ref 150–400)
RBC: 5.6 MIL/uL (ref 4.22–5.81)
RDW: 16.1 % — ABNORMAL HIGH (ref 11.5–15.5)
WBC: 11.4 10*3/uL — AB (ref 4.0–10.5)

## 2015-01-28 LAB — HEMOGLOBIN AND HEMATOCRIT, BLOOD
HCT: 40.7 % (ref 39.0–52.0)
HEMOGLOBIN: 12.9 g/dL — AB (ref 13.0–17.0)

## 2015-01-28 MED ORDER — CHLORHEXIDINE GLUCONATE 0.12 % MT SOLN
15.0000 mL | Freq: Two times a day (BID) | OROMUCOSAL | Status: DC
  Administered 2015-01-28 (×2): 15 mL via OROMUCOSAL
  Filled 2015-01-28 (×4): qty 15

## 2015-01-28 MED ORDER — FUROSEMIDE 10 MG/ML IJ SOLN
20.0000 mg | Freq: Once | INTRAMUSCULAR | Status: AC
  Administered 2015-01-28: 20 mg via INTRAVENOUS
  Filled 2015-01-28: qty 2

## 2015-01-28 MED ORDER — CETYLPYRIDINIUM CHLORIDE 0.05 % MT LIQD
7.0000 mL | Freq: Two times a day (BID) | OROMUCOSAL | Status: DC
  Administered 2015-01-28 (×2): 7 mL via OROMUCOSAL

## 2015-01-28 MED ORDER — METOPROLOL TARTRATE 1 MG/ML IV SOLN
5.0000 mg | Freq: Four times a day (QID) | INTRAVENOUS | Status: DC | PRN
  Administered 2015-01-28: 5 mg via INTRAVENOUS
  Filled 2015-01-28 (×2): qty 5

## 2015-01-28 MED ORDER — PNEUMOCOCCAL VAC POLYVALENT 25 MCG/0.5ML IJ INJ
0.5000 mL | INJECTION | INTRAMUSCULAR | Status: AC
  Administered 2015-01-29: 0.5 mL via INTRAMUSCULAR
  Filled 2015-01-28 (×2): qty 0.5

## 2015-01-28 MED ORDER — IOHEXOL 300 MG/ML  SOLN
50.0000 mL | Freq: Once | INTRAMUSCULAR | Status: AC | PRN
  Administered 2015-01-28: 50 mL via ORAL

## 2015-01-28 NOTE — Progress Notes (Signed)
Pt refused CPAP for tonight.  Pt states that he was unable to sleep last night while it was on.  Pt will contact RT if he decides to wear it.  RT will continue to monitor as needed.

## 2015-01-28 NOTE — Progress Notes (Signed)
Patient ID: Leonard Lewis, male   DOB: 1980-12-23, 34 y.o.   MRN: [id number] [facility] Surgery Progress Note:   1 Day Post-Op  Subjective: Mental status is clear.  Feeling OK Objective: Vital signs in last 24 hours: Temp:  [97.4 F (36.3 C)-98.4 F (36.9 C)] 97.4 F (36.3 C) (04/26 0800) Pulse Rate:  [83-100] 88 (04/26 0800) Resp:  [3-30] 28 (04/26 1100) BP: (95-199)/(66-123) 190/100 mmHg (04/26 1100) SpO2:  [87 %-100 %] 96 % (04/26 0800)  Intake/Output from previous day: 04/25 0701 - 04/26 0700 In: 3358.3 [I.V.:3358.3] Out: 820 [Urine:800; Blood:20] Intake/Output this shift: Total I/O In: 400 [I.V.:400] Out: -   Physical Exam: Work of breathing is baseline.  Uses CPaP.  Incisions sore  Lab Results:  Results for orders placed or performed during the hospital encounter of 01/27/15 (from the past 48 hour(s))  MRSA PCR Screening     Status: None   Collection Time: 01/27/15  5:01 AM  Result Value Ref Range   MRSA by PCR NEGATIVE NEGATIVE    Comment:        The GeneXpert MRSA Assay (FDA approved for NASAL specimens only), is one component of a comprehensive MRSA colonization surveillance program. It is not intended to diagnose MRSA infection nor to guide or monitor treatment for MRSA infections.   Hemoglobin and hematocrit, blood     Status: None   Collection Time: 01/27/15 11:13 AM  Result Value Ref Range   Hemoglobin 13.4 13.0 - 17.0 g/dL   HCT 40.9 81.1 - 91.4 %  CBC WITH DIFFERENTIAL     Status: Abnormal   Collection Time: 01/28/15  4:10 AM  Result Value Ref Range   WBC 11.4 (H) 4.0 - 10.5 K/uL   RBC 5.60 4.22 - 5.81 MIL/uL   Hemoglobin 12.3 (L) 13.0 - 17.0 g/dL   HCT 78.2 (L) 95.6 - 21.3 %   MCV 69.5 (L) 78.0 - 100.0 fL   MCH 22.0 (L) 26.0 - 34.0 pg   MCHC 31.6 30.0 - 36.0 g/dL   RDW 08.6 (H) 57.8 - 46.9 %   Platelets 309 150 - 400 K/uL   Neutrophils Relative % 79 (H) 43 - 77 %   Lymphocytes Relative 14 12 - 46 %   Monocytes Relative 7 3 - 12 %   Eosinophils Relative 0 0 - 5 %   Basophils Relative 0 0 - 1 %   Neutro Abs 9.0 (H) 1.7 - 7.7 K/uL   Lymphs Abs 1.6 0.7 - 4.0 K/uL   Monocytes Absolute 0.8 0.1 - 1.0 K/uL   Eosinophils Absolute 0.0 0.0 - 0.7 K/uL   Basophils Absolute 0.0 0.0 - 0.1 K/uL   RBC Morphology TARGET CELLS     Radiology/Results: Dg Ugi W/water Sol Cm  01/28/2015   CLINICAL DATA:  34 year old male status post laparoscopic sleeve gastrectomy yesterday.  EXAM: WATER SOLUBLE UPPER GI SERIES  TECHNIQUE: Single-column upper GI series was performed using water soluble contrast.  CONTRAST:  50mL OMNIPAQUE IOHEXOL 300 MG/ML  SOLN  COMPARISON:  12/06/2013.  FLUOROSCOPY TIME:  If the device does not provide the exposure index:  Fluoroscopy Time (in minutes and seconds):  1 minutes and 28 seconds  Number of Acquired Images:  13  FINDINGS: Multiple spot views were obtained after ingestion of Omnipaque, demonstrating a typical post sleeve gastrectomy appearance of the stomach. No signs of extravasation of contrast. No evidence of obstruction. Contrast readily traversed the pylorus into the proximal small bowel.  IMPRESSION:  1. Expected postoperative appearance of sleeve gastrectomy, without evidence of acute complicating features, as above.   Electronically Signed   By: Trudie Reedaniel  Entrikin M.D.   On: 01/28/2015 10:31    Anti-infectives: Anti-infectives    Start     Dose/Rate Route Frequency Ordered Stop   01/27/15 0502  cefOXitin (MEFOXIN) 2 g in dextrose 5 % 50 mL IVPB     2 g 100 mL/hr over 30 Minutes Intravenous On call to O.R. 01/27/15 0502 01/27/15 0717      Assessment/Plan: Problem List: Patient Active Problem List   Diagnosis Date Noted  . 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 Chronic systolic heart failure 10/30/2013  . Chest pain 06/20/2012  . Hyperglycemia 10/26/2011  . OBSTRUCTIVE SLEEP APNEA 05/31/2007  . HYPERTENSION, BENIGN ESSENTIAL 05/02/2007  . HYPERLIPIDEMIA 03/17/2007  . Morbid  obesity 03/17/2007    UGI ok.  DVT study OK.  Start PD 1 diet and transfer to 5W 1 Day Post-Op    LOS: 1 day   Matt B. Daphine DeutscherMartin, MD, [facility]  [facility] [phone] beeper [phone]  01/28/2015 11:59 AM

## 2015-01-28 NOTE — Progress Notes (Signed)
Pt could not tolerate CPAP mask with settings that pt stated he wears at home. Placed pt on auto mode at this time

## 2015-01-28 NOTE — Plan of Care (Signed)
Problem: Phase I Progression Outcomes Goal: CPAP/BI-PAP utilized with sleeping per order Outcome: Not Met (add Reason) Pt refusing CPAP for tonight

## 2015-01-28 NOTE — Plan of Care (Signed)
Problem: Food- and Nutrition-Related Knowledge Deficit (NB-1.1) Goal: Nutrition education Formal process to instruct or train a patient/client in a skill or to impart knowledge to help patients/clients voluntarily manage or modify food choices and eating behavior to maintain or improve health. Outcome: Completed/Met Date Met:  01/28/15 Nutrition Education Note  Received consult for diet education per DROP protocol.   Discussed 2 week post op diet with pt. Emphasized that liquids must be non carbonated, non caffeinated, and sugar free. Fluid goals discussed. Pt to follow up with outpatient bariatric RD for further diet progression after 2 weeks. Multivitamins and minerals also reviewed. Teach back method used, pt expressed understanding, expect good compliance.   Diet: First 2 Weeks  You will see the nutritionist about two (2) weeks after your surgery. The nutritionist will increase the types of foods you can eat if you are handling liquids well:  If you have severe vomiting or nausea and cannot handle clear liquids lasting longer than 1 day, call your surgeon  Protein Shake  Drink at least 2 ounces of shake 5-6 times per day  Each serving of protein shakes (usually 8 - 12 ounces) should have a minimum of:  15 grams of protein  And no more than 5 grams of carbohydrate  Goal for protein each day:  Men = 80 grams per day  Women = 60 grams per day  Protein powder may be added to fluids such as non-fat milk or Lactaid milk or Soy milk (limit to 35 grams added protein powder per serving)   Hydration  Slowly increase the amount of water and other clear liquids as tolerated (See Acceptable Fluids)  Slowly increase the amount of protein shake as tolerated  Sip fluids slowly and throughout the day  May use sugar substitutes in small amounts (no more than 6 - 8 packets per day; i.e. Splenda)   Fluid Goal  The first goal is to drink at least 8 ounces of protein shake/drink per day (or as directed  by the nutritionist); some examples of protein shakes are [employer], [employer]R [employer], EAS Edge HP, and Unjury. See handout from pre-op Bariatric Education Class:  Slowly increase the amount of protein shake you drink as tolerated  You may find it easier to slowly sip shakes throughout the day  It is important to get your proteins in first  Your fluid goal is to drink 64 - 100 ounces of fluid daily  It may take a few weeks to build up to this  32 oz (or more) should be clear liquids  And  32 oz (or more) should be full liquids (see below for examples)  Liquids should not contain sugar, caffeine, or carbonation   Clear Liquids:  Water or Sugar-free flavored water (i.e. Fruit H2O, Propel)  Decaffeinated coffee or tea (sugar-free)  Crystal Lite, Wyler's Lite, Minute Maid Lite  Sugar-free Jell-O  Bouillon or broth  Sugar-free Popsicle: *Less than 20 calories each; Limit 1 per day   Full Liquids:  Protein Shakes/Drinks + 2 choices per day of other full liquids  Full liquids must be:  No More Than 12 grams of Carbs per serving  No More Than 3 grams of Fat per serving  Strained low-fat cream soup  Non-Fat milk  Fat-free Lactaid Milk  Sugar-free yogurt (Dannon Lite & Fit, Newell yogurt)    [place], [place], [place]

## 2015-01-28 NOTE — Progress Notes (Signed)
Bilateral lower extremity venous duplex completed:  No evidence of DVT, superficial thrombosis, or Baker's cyst.   

## 2015-01-28 NOTE — Progress Notes (Signed)
Pt was not tolerating original settings that he stated he was on at home. RT attempted to place pt on an auto mode. Pts SpO2 did not tolerate auto mode settings. RT placed pt back on original settings stated by pt which was 19 on FFM.

## 2015-01-28 NOTE — Progress Notes (Signed)
Patient alert and oriented, Post op day 1.  Provided support and encouragement.  Encouraged pulmonary toilet, ambulation and small sips of liquids.  All questions answered.  Will continue to monitor. 

## 2015-01-29 LAB — CBC WITH DIFFERENTIAL/PLATELET
Basophils Absolute: 0 10*3/uL (ref 0.0–0.1)
Basophils Relative: 0 % (ref 0–1)
EOS ABS: 0 10*3/uL (ref 0.0–0.7)
EOS PCT: 0 % (ref 0–5)
HCT: 38.5 % — ABNORMAL LOW (ref 39.0–52.0)
Hemoglobin: 12 g/dL — ABNORMAL LOW (ref 13.0–17.0)
LYMPHS PCT: 35 % (ref 12–46)
Lymphs Abs: 3.7 10*3/uL (ref 0.7–4.0)
MCH: 21.7 pg — AB (ref 26.0–34.0)
MCHC: 31.2 g/dL (ref 30.0–36.0)
MCV: 69.5 fL — ABNORMAL LOW (ref 78.0–100.0)
Monocytes Absolute: 1 10*3/uL (ref 0.1–1.0)
Monocytes Relative: 9 % (ref 3–12)
NEUTROS PCT: 56 % (ref 43–77)
Neutro Abs: 5.9 10*3/uL (ref 1.7–7.7)
PLATELETS: 305 10*3/uL (ref 150–400)
RBC: 5.54 MIL/uL (ref 4.22–5.81)
RDW: 16.2 % — ABNORMAL HIGH (ref 11.5–15.5)
WBC: 10.6 10*3/uL — ABNORMAL HIGH (ref 4.0–10.5)

## 2015-01-29 LAB — BASIC METABOLIC PANEL
Anion gap: 7 (ref 5–15)
BUN: 22 mg/dL (ref 6–23)
CALCIUM: 8.9 mg/dL (ref 8.4–10.5)
CHLORIDE: 105 mmol/L (ref 96–112)
CO2: 26 mmol/L (ref 19–32)
Creatinine, Ser: 1.58 mg/dL — ABNORMAL HIGH (ref 0.50–1.35)
GFR calc Af Amer: 64 mL/min — ABNORMAL LOW (ref >90)
GFR calc non Af Amer: 56 mL/min — ABNORMAL LOW (ref >90)
Glucose, Bld: 114 mg/dL — ABNORMAL HIGH (ref 70–99)
Potassium: 4 mmol/L (ref 3.5–5.1)
Sodium: 138 mmol/L (ref 135–145)

## 2015-01-29 NOTE — Discharge Summary (Signed)
Physician Discharge Summary  Patient ID: Leonard Lewis MRN: [id number] DOB/AGE: [birth year] 34 y.o.  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  Went to stepdown postop.  Had issues with elevated BP.  UGI ok on PD 1 and liquids begun.  Back on oral meds and ready for discharge on PD2  Consults: none  Significant Diagnostic Studies: UGI    Discharge Exam: Blood pressure 133/81, pulse 91, temperature 98.6 F (37 C), temperature source Oral, resp. rate 22, height 5\' 11"  (1.803 m), weight 199.2 kg (439 lb 2.5 oz), SpO2 89 %. Incisions OK  Disposition: 01-Home or Self Care  Discharge Instructions    Ambulate hourly while awake    Complete by:  As directed      Call MD for:  difficulty breathing, headache or visual disturbances    Complete by:  As directed      Call MD for:  persistant dizziness or light-headedness    Complete by:  As directed      Call MD for:  persistant nausea and vomiting    Complete by:  As directed      Call MD for:  redness, tenderness, or signs of infection (pain, swelling, redness, odor or green/yellow discharge around incision site)    Complete by:  As directed      Call MD for:  severe uncontrolled pain    Complete by:  As directed      Call MD for:  temperature >101 F    Complete by:  As directed      Diet bariatric full liquid    Complete by:  As directed      Incentive spirometry    Complete by:  As directed   Perform hourly while awake            Medication List    STOP taking these medications        azithromycin 250 MG tablet  Commonly known as:  ZITHROMAX      TAKE these medications        albuterol (2.5 MG/3ML) 0.083% nebulizer solution  Commonly known as:  PROVENTIL  Take 3  mLs (2.5 mg total) by nebulization every 6 (six) hours as needed for wheezing or shortness of breath.     albuterol 108 (90 BASE) MCG/ACT inhaler  Commonly known as:  PROVENTIL HFA;VENTOLIN HFA  Inhale 2 puffs into the lungs every 4 (four) hours as needed for wheezing.     amLODipine 10 MG tablet  Commonly known as:  NORVASC  TAKE 1 TABLET (10 MG TOTAL) BY MOUTH DAILY.     aspirin EC 81 MG tablet  Take 81 mg by mouth daily.     carvedilol 25 MG tablet  Commonly known as:  COREG  Take 25 mg by mouth 2 (two) times daily.     ipratropium 0.06 % nasal spray  Commonly known as:  ATROVENT  Place 2 sprays into both nostrils 4 (four) times daily.     lisinopril 40 MG tablet  Commonly known as:  PRINIVIL,ZESTRIL  Take 40 mg by mouth 2 (two) times daily.     loratadine-pseudoephedrine 10-240 MG per 24 hr tablet  Commonly known as:  CLARITIN-D 24-hour  Take 1 tablet by mouth daily  as needed for allergies.     meloxicam 15 MG tablet  Commonly known as:  MOBIC  Take 1 tablet (15 mg total) by mouth daily.     nitroGLYCERIN 0.4 MG SL tablet  Commonly known as:  NITROSTAT  Place 1 tablet (0.4 mg total) under the tongue every 5 (five) minutes as needed for chest pain.     predniSONE 10 MG tablet  Commonly known as:  DELTASONE  Take 3 tablets (30 mg total) by mouth daily.     spironolactone 50 MG tablet  Commonly known as:  ALDACTONE  Take 50 mg by mouth daily.           Follow-up Information    Follow up with Valarie Merino, MD.   Specialty:  General Surgery   Contact information:   [address] [place] [address] [phone]       Signed: Valarie Merino 01/29/2015, 8:18 AM

## 2015-01-29 NOTE — Discharge Instructions (Signed)

## 2015-01-29 NOTE — Progress Notes (Signed)
Patient alert and oriented, pain is controlled. Patient is tolerating fluids,  advanced to protein shake today, patient tolerated well.  Reviewed Gastric sleeve discharge instructions with patient and patient is able to articulate understanding.  Provided information on BELT program, Support Group and [facility] outpatient pharmacy. All questions answered, will continue to monitor.  

## 2015-01-31 ENCOUNTER — Telehealth (HOSPITAL_COMMUNITY): Payer: Self-pay

## 2015-01-31 NOTE — Telephone Encounter (Signed)
Made discharge phone call to patient per DROP protocol. Asking the following questions.    1. Do you have someone to care for you now that you are home?  yes 2. Are you having pain now that is not relieved by your pain medication?  no 3. Are you able to drink the recommended daily amount of fluids (48 ounces minimum/day) and protein (60-80 grams/day) as prescribed by the dietitian or nutritional counselor?  Yes, i have had 52 grams of protein today,  Sugar free jell-o and 8oz water 4. Are you taking the vitamins and minerals as prescribed?  yes 5. Do you have the "on call" number to contact your surgeon if you have a problem or question?  yes 6. Are your incisions free of redness, swelling or drainage? (If steri strips, address that these can fall off, shower as tolerated) yes 7. Have your bowels moved since your surgery?  If not, are you passing gas?  yes 8. Are you up and walking 3-4 times per day?  Yes, walked around the house and I am about to go outside in a few minutes    1. Do you have an appointment made to see your surgeon in the next month?  yes 2. Were you provided your discharge medications before your surgery or before you were discharged from the hospital and are you taking them without problem?  yes 3. Were you provided phone numbers to the clinic/surgeon's office?  yes 4. Did you watch the patient education video module in the (clinic, surgeon's office, etc.) before your surgery? yes 5. Do you have a discharge checklist that was provided to you in the hospital to reference with instructions on how to take care of yourself after surgery?  yes 6. Did you see a dietitian or nutritional counselor while you were in the hospital?  yes 7. Do you have an appointment to see a dietitian or nutritional counselor in the next month? yes

## 2015-02-11 ENCOUNTER — Encounter: Payer: BLUE CROSS/BLUE SHIELD | Attending: Surgery

## 2015-02-11 DIAGNOSIS — Z713 Dietary counseling and surveillance: Secondary | ICD-10-CM | POA: Diagnosis not present

## 2015-02-11 DIAGNOSIS — Z6841 Body Mass Index (BMI) 40.0 and over, adult: Secondary | ICD-10-CM | POA: Insufficient documentation

## 2015-02-11 NOTE — Progress Notes (Signed)
Bariatric Class:  Appt start time: 1530 end time:  1615.  2 Week Post-Operative Nutrition Class  Patient was seen on 02/11/15 for Post-Operative Nutrition [facility].   Surgery date: 01/27/15 Surgery type: Gastric sleeve Start weight at [facility]: 451 lbs on 05/02/14, 495.5 on 01/06/15 Weight today: 425.0 lbs  Weight change: 34.5 lbs   TANITA  BODY COMP RESULTS  01/06/15 02/11/15   BMI (kg/m^2) 63.2 58.5   Fat Mass (lbs) 293.5 273.5   Fat Free Mass (lbs) 166 151.5   Total Body Water (lbs) 121.5 111.0    The following the learning objectives were met by the patient during this course:  Identifies Phase 3A (Soft, High Proteins) Dietary Goals and will begin from 2 weeks post-operatively to 2 months post-operatively  Identifies appropriate sources of fluids and proteins   States protein recommendations and appropriate sources post-operatively  Identifies the need for appropriate texture modifications, mastication, and bite sizes when consuming solids  Identifies appropriate multivitamin and calcium sources post-operatively  Describes the need for physical activity post-operatively and will follow MD recommendations  States when to call healthcare provider regarding medication questions or post-operative complications  Handouts given during class include:  Phase 3A: Soft, High Protein Diet Handout  Follow-Up Plan: Patient will follow-up at [facility] in 6 weeks for 2 month post-op nutrition visit for diet advancement per MD.

## 2015-03-25 ENCOUNTER — Ambulatory Visit: Payer: BLUE CROSS/BLUE SHIELD | Admitting: Dietician

## 2015-03-27 ENCOUNTER — Encounter: Payer: Self-pay | Admitting: Dietician

## 2015-03-27 ENCOUNTER — Encounter: Payer: BLUE CROSS/BLUE SHIELD | Attending: Surgery | Admitting: Dietician

## 2015-03-27 DIAGNOSIS — Z713 Dietary counseling and surveillance: Secondary | ICD-10-CM | POA: Diagnosis not present

## 2015-03-27 DIAGNOSIS — Z6841 Body Mass Index (BMI) 40.0 and over, adult: Secondary | ICD-10-CM | POA: Diagnosis not present

## 2015-03-27 NOTE — Progress Notes (Signed)
  Follow-up visit:  8 Weeks Post-Operative Gastric sleeve Surgery  Medical Nutrition Therapy:  Appt start time: 1105 end time:  1120.  Primary concerns today: Post-operative Bariatric Surgery Nutrition Management. Leonard Lewis returns today having lost another 20 pounds. Drank some lemonade with sugar and did not have dumping syndrome. He is tolerating all recommended foods except some vegetables like peppers and onions. Eating cooked okra, tomatoes, corn, cauliflower, and broccoli.  Surgery date: 01/27/15 Surgery type: Gastric sleeve Start weight at [facility]: 451 lbs on 05/02/14, 495.5 on 01/06/15 Weight today: 406 lbs Weight change: 19 lbs Total weight lost: 45 lbs (89.5 lbs)  TANITA  BODY COMP RESULTS  01/06/15 02/11/15 03/27/15   BMI (kg/m^2) 63.2 58.5 55.8   Fat Mass (lbs) 293.5 273.5 236.5   Fat Free Mass (lbs) 166 151.5 169.5   Total Body Water (lbs) 121.5 111.0 124    Preferred Learning Style:   No preference indicated   Learning Readiness:   Ready  24-hr recall: B (AM): Premier protein shake (30g) Snk (AM):   L (PM): deli meat or roast beef from [employer] (23g) Snk (PM): cheese sticks OR cheese stick and meat (12g)  D (PM): burger or chicken without bun or Wendy's chili (14-21g) Snk (PM):   Fluid intake: water, G2 gatorade, powerade zero (64-100 oz) Estimated total protein intake: 79-93g  Medications: no change Supplementation: taking  Using straws: no Drinking while eating: no Hair loss: no Carbonated beverages: no N/V/D/C: constipation, using Miralax Dumping syndrome: no  Recent physical activity:  [employer] about 3x a week; walking a mile and weight machines; plans to resume water aerobics  Progress Towards Goal(s):  In progress.  Handouts given during visit include:  Phase 3B lean protein + non starchy vegetables   Nutritional Diagnosis:  [place]-3.3 Overweight/obesity related to past poor dietary habits and physical inactivity as evidenced by patient w/ recent gastric sleeve  surgery following dietary guidelines for continued weight loss.     Intervention:  Nutrition counseling provided.  Teaching Method Utilized:  Visual Auditory Hands on  Barriers to learning/adherence to lifestyle change: none  Demonstrated degree of understanding via:  Teach Back   Monitoring/Evaluation:  Dietary intake, exercise,and body weight. Follow up in 6 weeks for 3.5 month post-op visit.

## 2015-03-27 NOTE — Patient Instructions (Signed)
Goals:  Follow Phase 3B: High Protein + Non-Starchy Vegetables  Eat 3-6 small meals/snacks, every 3-5 hrs  Increase lean protein foods to meet 80g goal  Increase fluid intake to 64oz +  Avoid drinking 15 minutes before, during and 30 minutes after eating  Aim for >30 min of physical activity daily  TANITA  BODY COMP RESULTS  01/06/15 02/11/15 03/27/15   BMI (kg/m^2) 63.2 58.5 55.8   Fat Mass (lbs) 293.5 273.5 236.5   Fat Free Mass (lbs) 166 151.5 169.5   Total Body Water (lbs) 121.5 111.0 124

## 2015-04-20 ENCOUNTER — Other Ambulatory Visit: Payer: Self-pay | Admitting: Family Medicine

## 2015-04-21 ENCOUNTER — Telehealth: Payer: Self-pay | Admitting: Family Medicine

## 2015-04-21 NOTE — Telephone Encounter (Signed)
Spoke to Leonard Lewis. Told him Amlodipine was refilled for 30 days. I explained he needs to make a follow up appt for further refills. Reminded him it has been 1.5 years since he was last seen here. Aldona LentoSharon T Saunders,CMA

## 2015-04-21 NOTE — Telephone Encounter (Signed)
I am covering for Dr. Gwendolyn GrantWalden who is away from the office.  [facility] team - please call pt and let him know I have refilled his amlodipine for 30 days, but he needs to schedule an appointment to follow up on his blood pressure as [facility]. Thanks!  Latrelle DodrillBrittany J McIntyre, MD

## 2015-05-08 ENCOUNTER — Ambulatory Visit: Payer: BLUE CROSS/BLUE SHIELD | Admitting: Dietician

## 2015-05-13 ENCOUNTER — Other Ambulatory Visit: Payer: Self-pay | Admitting: *Deleted

## 2015-05-13 MED ORDER — AMLODIPINE BESYLATE 10 MG PO TABS: ORAL_TABLET | ORAL | Status: DC

## 2015-05-19 ENCOUNTER — Encounter: Payer: BLUE CROSS/BLUE SHIELD | Attending: Surgery | Admitting: Dietician

## 2015-05-19 ENCOUNTER — Encounter: Payer: Self-pay | Admitting: Dietician

## 2015-05-19 DIAGNOSIS — Z713 Dietary counseling and surveillance: Secondary | ICD-10-CM | POA: Diagnosis not present

## 2015-05-19 DIAGNOSIS — Z6841 Body Mass Index (BMI) 40.0 and over, adult: Secondary | ICD-10-CM | POA: Diagnosis not present

## 2015-05-19 NOTE — Progress Notes (Signed)
  Follow-up visit:  3.5 Months Post-Operative Gastric sleeve Surgery  Medical Nutrition Therapy:  Appt start time: 1140 end time:  1155  Primary concerns today: Post-operative Bariatric Surgery Nutrition Management. Kaidyn returns today having lost another 23 pounds. He reports that he can tolerate about 4 oz meat. Eating a variety of vegetables. Sometimes eats crackers for a snack.     Surgery date: 01/27/15 Surgery type: Gastric sleeve Start weight at [facility]: 451 lbs on 05/02/14, 459 on 01/06/15 Weight today: 383 lbs Weight change: 23 lbs Total weight lost: 76 lbs  TANITA  BODY COMP RESULTS  01/06/15 02/11/15 03/27/15 05/19/15   BMI (kg/m^2) 63.2 58.5 55.8 52.7   Fat Mass (lbs) 293.5 273.5 236.5 228   Fat Free Mass (lbs) 166 151.5 169.5 155   Total Body Water (lbs) 121.5 111.0 124 113.5    Preferred Learning Style:   No preference indicated   Learning Readiness:   Ready  24-hr recall: B (AM): Premier protein shake (30g) Snk (AM):   L (PM): deli meat and cheese (14g) Snk (PM): pork rinds or peanut butter crackers  D (PM): burger or chicken without bun or [employer]'s chili or 3-4 oz chicken or steak with a vegetable (14-28g) Snk (PM):   Fluid intake: water, water with sugar free flavoring, powerade zero (64-100 oz) Estimated total protein intake: 60-80g (possibly insufficient some days)  Medications: no change Supplementation: taking  Using straws: no Drinking while eating: no Hair loss: no Carbonated beverages: no N/V/D/C: constipation, using Miralax Dumping syndrome: no  Recent physical activity:  [employer] about 3x a week; walking a mile and weight machines; walks dog for 30 minutes  Progress Towards Goal(s):  In progress.  Handouts given during visit include:   Nutritional Diagnosis:  [place]-3.3 Overweight/obesity related to past poor dietary habits and physical inactivity as evidenced by patient w/ recent gastric sleeve surgery following dietary guidelines for continued weight  loss.     Intervention:  Nutrition counseling provided.  Teaching Method Utilized:  Visual Auditory Hands on  Barriers to learning/adherence to lifestyle change: none  Demonstrated degree of understanding via:  Teach Back   Monitoring/Evaluation:  Dietary intake, exercise,and body weight. Follow up in 6 weeks for 5 month post-op visit.

## 2015-05-19 NOTE — Patient Instructions (Addendum)
Goals:  Follow Phase 3B: High Protein + Non-Starchy Vegetables  Eat 3-6 small meals/snacks, every 3-5 hrs  Increase lean protein foods to meet 80g goal  Increase fluid intake to 64oz +  Avoid drinking 15 minutes before, during and 30 minutes after eating  Aim for >30 min of physical activity daily  Try having a protein snack instead of crackers in the afternoons  Try adding a high-protein morning snack

## 2015-05-31 ENCOUNTER — Other Ambulatory Visit: Payer: Self-pay | Admitting: Family Medicine

## 2015-06-09 ENCOUNTER — Other Ambulatory Visit: Payer: Self-pay | Admitting: Family Medicine

## 2015-06-10 NOTE — Addendum Note (Signed)
Addended by: Clovis Pu on: 06/10/2015 12:17 PM   Modules accepted: Orders

## 2015-06-10 NOTE — Telephone Encounter (Signed)
Patient's insurance requires a 90 day supply.  Tywon Niday L, RN  

## 2015-06-11 MED ORDER — AMLODIPINE BESYLATE 10 MG PO TABS: 10.0000 mg | ORAL_TABLET | Freq: Every day | ORAL | Status: DC

## 2015-06-30 ENCOUNTER — Encounter: Payer: Self-pay | Admitting: Dietician

## 2015-06-30 ENCOUNTER — Encounter: Payer: BLUE CROSS/BLUE SHIELD | Attending: Surgery | Admitting: Dietician

## 2015-06-30 DIAGNOSIS — Z6841 Body Mass Index (BMI) 40.0 and over, adult: Secondary | ICD-10-CM | POA: Diagnosis not present

## 2015-06-30 DIAGNOSIS — Z713 Dietary counseling and surveillance: Secondary | ICD-10-CM | POA: Diagnosis not present

## 2015-06-30 NOTE — Progress Notes (Signed)
  Follow-up visit:  5 Months Post-Operative Gastric sleeve Surgery  Medical Nutrition Therapy:  Appt start time: 235 end time:  250  Primary concerns today: Post-operative Bariatric Surgery Nutrition Management. Leonard Lewis returns today having lost another 6 pounds (12.5 lbs fat). He feels like his weight loss has slowed. He has chosen higher protein snacks between meals. However, he has added bread to his lunch and dinner.    Surgery date: 01/27/15 Surgery type: Gastric sleeve Start weight at [facility]: 451 lbs on 05/02/14, 459 on 01/06/15 Weight today: 377 lbs Weight change: 6 lbs (12.5 lbs) Total weight lost: 79 lbs  TANITA  BODY COMP RESULTS  01/06/15 02/11/15 03/27/15 05/19/15 06/30/15   BMI (kg/m^2) 63.2 58.5 55.8 52.7 51.8   Fat Mass (lbs) 293.5 273.5 236.5 228 215.5   Fat Free Mass (lbs) 166 151.5 169.5 155 161.5   Total Body Water (lbs) 121.5 111.0 124 113.5 118    Preferred Learning Style:   No preference indicated   Learning Readiness:   Ready  24-hr recall: B (AM): Premier protein shake and yogurt (42g) Snk (AM):  Babybel cheese L (PM): 1/2 of a 6 inch tuna or [place] flatbread from [employer] OR roast beef from [employer] with no bread Snk (PM): nuts D (PM): rest of [employer] sandwich from lunch OR salad with grilled chicken  Snk (PM):   Fluid intake: water, sometimes water with sugar free flavoring, powerade zero (64-100 oz) Estimated total protein intake: 60-80g (possibly insufficient some days)  Medications: no change Supplementation: taking  Using straws: no Drinking while eating: no Hair loss: no Carbonated beverages: no N/V/D/C: constipation resolving, using Miralax less often Dumping syndrome: no  Recent physical activity:  [employer] about 3-4x a week; walking a mile and weight machines; walks dog for 30 minutes  Progress Towards Goal(s):  In progress.  Handouts given during visit include:   Nutritional Diagnosis:  [place]-3.3 Overweight/obesity related to past poor dietary habits  and physical inactivity as evidenced by patient w/ recent gastric sleeve surgery following dietary guidelines for continued weight loss.     Intervention:  Nutrition counseling provided.  Teaching Method Utilized:  Visual Auditory Hands on  Barriers to learning/adherence to lifestyle change: none  Demonstrated degree of understanding via:  Teach Back   Monitoring/Evaluation:  Dietary intake, exercise,and body weight. Follow up in 6 weeks for 6.5 month post-op visit.

## 2015-06-30 NOTE — Patient Instructions (Addendum)
Goals:  Follow Phase 3B: High Protein + Non-Starchy Vegetables  Eat 3-6 small meals/snacks, every 3-5 hrs  Continue to meet 80g protein goal  Avoid bread at lunch and dinner (try a lettuce wrap instead of bread)  Surgery date: 01/27/15 Surgery type: Gastric sleeve Start weight at [facility]: 451 lbs on 05/02/14, 459 on 01/06/15 Weight today: 377 lbs Weight change: 6 lbs (12.5 lbs) Total weight lost: 79 lbs  TANITA  BODY COMP RESULTS  01/06/15 02/11/15 03/27/15 05/19/15 06/30/15   BMI (kg/m^2) 63.2 58.5 55.8 52.7 51.8   Fat Mass (lbs) 293.5 273.5 236.5 228 215.5   Fat Free Mass (lbs) 166 151.5 169.5 155 161.5   Total Body Water (lbs) 121.5 111.0 124 113.5 118

## 2015-08-11 ENCOUNTER — Ambulatory Visit: Payer: BLUE CROSS/BLUE SHIELD | Admitting: Dietician

## 2015-08-20 ENCOUNTER — Ambulatory Visit: Payer: BLUE CROSS/BLUE SHIELD | Admitting: Dietician

## 2015-11-05 ENCOUNTER — Encounter: Payer: BLUE CROSS/BLUE SHIELD | Attending: Surgery | Admitting: Dietician

## 2015-11-05 ENCOUNTER — Encounter: Payer: Self-pay | Admitting: Dietician

## 2015-11-05 DIAGNOSIS — Z6841 Body Mass Index (BMI) 40.0 and over, adult: Secondary | ICD-10-CM | POA: Diagnosis not present

## 2015-11-05 NOTE — Progress Notes (Signed)
  Follow-up visit:  9.5 Months Post-Operative Gastric sleeve Surgery  Medical Nutrition Therapy:  Appt start time: 950 end time:  1010  Primary concerns today: Post-operative Bariatric Surgery Nutrition Management. Leonard Lewis returns today having lost another 17 lbs. He states he expected to lose more weight because his scale at home shows 330 lbs. Still practicing a low carb, high protein diet. Has not been exercising as much with the holidays but resumed routine at the beginning of January.    Surgery date: 01/27/15 Surgery type: Gastric sleeve Start weight at [facility]: 451 lbs on 05/02/14, 459 on 01/06/15 Weight today: 360 lbs Weight change: 17 lbs Total weight lost: 99 lbs Goal: under 300 lbs  TANITA  BODY COMP RESULTS  01/06/15 02/11/15 03/27/15 05/19/15 06/30/15 11/05/15   BMI (kg/m^2) 63.2 58.5 55.8 52.7 51.8 49.7   Fat Mass (lbs) 293.5 273.5 236.5 228 215.5 222.5   Fat Free Mass (lbs) 166 151.5 169.5 155 161.5 139   Total Body Water (lbs) 121.5 111.0 124 113.5 118 101.5    Preferred Learning Style:   No preference indicated   Learning Readiness:   Ready  24-hr recall: B (10AM): 2-3 eggs and bacon OR yogurt and Premier protein shake (15-42g) Snk (12PM):  Peanuts or cheese (5-7g) L (PM): lettuce wrap with [place], cheese, and bacon (14-21g) Snk (PM): peanuts (5g) D (PM): salad or Wendy's chili (14g) Snk (PM):   Fluid intake: water, sometimes water with sugar free flavoring, powerade zero (64-100 oz) Estimated total protein intake: 80+ g/day  Medications: no change Supplementation: taking  Using straws: no Drinking while eating: no Hair loss: no Carbonated beverages: no N/V/D/C: none Dumping syndrome: no  Recent physical activity:  [employer] about 3x a week; walking a mile and weight machines; walks dog for 30 minutes  Progress Towards Goal(s):  In progress.  Handouts given during visit include:   Nutritional Diagnosis:  [name]-3.3 Overweight/obesity related to past poor dietary  habits and physical inactivity as evidenced by patient w/ recent gastric sleeve surgery following dietary guidelines for continued weight loss.     Intervention:  Nutrition counseling provided.  Teaching Method Utilized:  Visual Auditory Hands on  Barriers to learning/adherence to lifestyle change: none  Demonstrated degree of understanding via:  Teach Back   Monitoring/Evaluation:  Dietary intake, exercise,and body weight. Follow up in 2 months for 12 month post-op visit.

## 2015-11-05 NOTE — Patient Instructions (Addendum)
Goals:  Follow Phase 3B: High Protein + Non-Starchy Vegetables  Eat 3-6 small meals/snacks, every 3-5 hrs  Continue to meet 80g protein goal  Watch the portions of peanuts  Continue exercise routine   Surgery date: 01/27/15 Surgery type: Gastric sleeve Start weight at [facility]: 451 lbs on 05/02/14, 459 on 01/06/15 Weight today: 360 lbs Weight change: 17 lbs Total weight lost: 99 lbs Goal: under 300 lbs

## 2016-01-26 ENCOUNTER — Ambulatory Visit: Payer: BLUE CROSS/BLUE SHIELD | Admitting: Dietician

## 2016-05-25 ENCOUNTER — Other Ambulatory Visit (HOSPITAL_COMMUNITY)
Admission: RE | Admit: 2016-05-25 | Discharge: 2016-05-25 | Disposition: A | Discharge status: Home / Self Care | Payer: BLUE CROSS/BLUE SHIELD | Source: Ambulatory Visit | Attending: Family Medicine | Admitting: Family Medicine

## 2016-05-25 ENCOUNTER — Ambulatory Visit (INDEPENDENT_AMBULATORY_CARE_PROVIDER_SITE_OTHER): Payer: BLUE CROSS/BLUE SHIELD | Admitting: Family Medicine

## 2016-05-25 ENCOUNTER — Encounter: Payer: Self-pay | Admitting: Family Medicine

## 2016-05-25 VITALS — BP 142/98 | HR 62 | Temp 98.5°F | Ht 72.0 in | Wt 351.4 lb

## 2016-05-25 DIAGNOSIS — Z23 Encounter for immunization: Secondary | ICD-10-CM

## 2016-05-25 DIAGNOSIS — Z113 Encounter for screening for infections with a predominantly sexual mode of transmission: Secondary | ICD-10-CM | POA: Diagnosis not present

## 2016-05-25 DIAGNOSIS — I10 Essential (primary) hypertension: Secondary | ICD-10-CM

## 2016-05-25 DIAGNOSIS — I5022 Chronic systolic (congestive) heart failure: Secondary | ICD-10-CM

## 2016-05-25 DIAGNOSIS — G4733 Obstructive sleep apnea (adult) (pediatric): Secondary | ICD-10-CM

## 2016-05-25 LAB — COMPREHENSIVE METABOLIC PANEL
ALT: 15 U/L (ref 9–46)
AST: 15 U/L (ref 10–40)
Albumin: 4.1 g/dL (ref 3.6–5.1)
Alkaline Phosphatase: 41 U/L (ref 40–115)
BUN: 19 mg/dL (ref 7–25)
CALCIUM: 9.4 mg/dL (ref 8.6–10.3)
CHLORIDE: 105 mmol/L (ref 98–110)
CO2: 25 mmol/L (ref 20–31)
Creat: 1.4 mg/dL — ABNORMAL HIGH (ref 0.60–1.35)
Glucose, Bld: 80 mg/dL (ref 65–99)
POTASSIUM: 4.5 mmol/L (ref 3.5–5.3)
Sodium: 140 mmol/L (ref 135–146)
Total Bilirubin: 0.3 mg/dL (ref 0.2–1.2)
Total Protein: 7.6 g/dL (ref 6.1–8.1)

## 2016-05-25 NOTE — Progress Notes (Signed)
Subjective:    Leonard Lewis is a 35 y.o. male who [facility] today for HTN:  1.  Hypertension:  Long-term problem for this patient.  No adverse effects from medication.  Not checking it regularly.  No HA, CP, dizziness, shortness of breath, palpitations, or LE swelling.  Still with some snoring at night.   BP Readings from Last 3 Encounters:  05/25/16 (!) 142/98  01/29/15 133/81  01/13/15 (!) 156/98    2.  Morbid obesity:  Recent gastric banding surgery. Has lost over 100 lbs.  Feels much better since this started.  More active.  He is walking and attempting to start a jogging regimen at the local [employer].  Has modified his diet to stay within the restrictions of the gastric banding.     ROS as above per HPI.    The following portions of the patient's history were reviewed and updated as appropriate: allergies, current medications, past medical history, family and social history, and problem list. Patient is a nonsmoker.    PMH reviewed.  Past Medical History:  Diagnosis Date  . Arthritis   . Asthma   . CHF (congestive heart failure) (HCC) 2008   EF 40-50%        . Hypertension   . Obesity, morbid (HCC)   . Obstructive sleep apnea    Past Surgical History:  Procedure Laterality Date  . CARDIAC CATHETERIZATION  Oct 2013   cardiac cath negative for obstructive disease -- did show end diastolic pressure secondary to systemic hypertension  . LAPAROSCOPIC GASTRIC SLEEVE RESECTION N/A 01/27/2015   Procedure: LAPAROSCOPIC GASTRIC SLEEVE RESECTION WITH UPPER ENDOSCOPY;  Surgeon: Luretha MurphyMatthew Martin, MD;  Location: [facility]S;  Service: General;  Laterality: N/A;  . LEFT HEART CATHETERIZATION WITH CORONARY ANGIOGRAM N/A 08/03/2012   Procedure: LEFT HEART CATHETERIZATION WITH CORONARY ANGIOGRAM;  Surgeon: Tonny BollmanMichael Cooper, MD;  Location: [facility] C[facility] LAB;  Service: Cardiovascular;  Laterality: N/A;  . NO PAST SURGERIES      Medications reviewed. Current Outpatient Prescriptions  Medication Sig  Dispense Refill  . albuterol (PROVENTIL HFA;VENTOLIN HFA) 108 (90 BASE) MCG/ACT inhaler Inhale 2 puffs into the lungs every 4 (four) hours as needed for wheezing. 1 Inhaler 0  . albuterol (PROVENTIL) (2.5 MG/3ML) 0.083% nebulizer solution Take 3 mLs (2.5 mg total) by nebulization every 6 (six) hours as needed for wheezing or shortness of breath. 150 mL 1  . amLODipine (NORVASC) 10 MG tablet TAKE 1 TABLET (10 MG TOTAL) BY MOUTH DAILY. 30 tablet 0  . amLODipine (NORVASC) 10 MG tablet Take 1 tablet (10 mg total) by mouth daily. Needs appt before any more refills or meds given 30 tablet 0  . aspirin EC 81 MG tablet Take 81 mg by mouth daily.    . carvedilol (COREG) 25 MG tablet Take 25 mg by mouth 2 (two) times daily.     Marland Kitchen. ipratropium (ATROVENT) 0.06 % nasal spray Place 2 sprays into both nostrils 4 (four) times daily. (Patient not taking: Reported on 01/10/2015) 15 mL 1  . lisinopril (PRINIVIL,ZESTRIL) 40 MG tablet Take 40 mg by mouth 2 (two) times daily.     Marland Kitchen. loratadine-pseudoephedrine (CLARITIN-D 24-HOUR) 10-240 MG per 24 hr tablet Take 1 tablet by mouth daily as needed for allergies.    . meloxicam (MOBIC) 15 MG tablet Take 1 tablet (15 mg total) by mouth daily. 30 tablet 0  . nitroGLYCERIN (NITROSTAT) 0.4 MG SL tablet Place 1 tablet (0.4 mg total) under the tongue every  5 (five) minutes as needed for chest pain. 15 tablet 1  . predniSONE (DELTASONE) 10 MG tablet Take 3 tablets (30 mg total) by mouth daily. (Patient not taking: Reported on 01/10/2015) 15 tablet 0  . spironolactone (ALDACTONE) 50 MG tablet Take 50 mg by mouth daily.     No current facility-administered medications for this visit.      Objective:   Physical Exam BP (!) 182/117   Pulse 62   Temp 98.5 F (36.9 C) (Oral)   Ht 6' (1.829 m)   Wt (!) 351 lb 6.4 oz (159.4 kg)   BMI 47.66 kg/m  Gen:  Alert, cooperative patient who appears stated age in no acute distress.  Vital signs reviewed. HEENT: EOMI,  MMM.  Hypertrophic  kissing tonsils noted.  No redness or exudates.   Cardiac:  Regular rate and rhythm  Pulm:  Clear to auscultation bilaterally with good air movement.  No wheezes or rales noted.   Abd:  Soft/nondistended/nontender.  Obese with loose skin noted.   Exts: Non edematous BL  LE, warm and well perfused.   No results found for this or any previous visit (from the past 72 hour(s)).

## 2016-05-25 NOTE — Patient Instructions (Signed)
It was good to see you today!  The repeat blood pressure was much better.  I'll call you with the lab results

## 2016-05-26 LAB — HIV ANTIBODY (ROUTINE TESTING W REFLEX): HIV: NONREACTIVE

## 2016-05-26 LAB — HEPATITIS PANEL, ACUTE
HCV AB: NEGATIVE
HEP A IGM: NONREACTIVE
HEP B C IGM: NONREACTIVE
HEP B S AG: NEGATIVE

## 2016-05-27 LAB — URINE CYTOLOGY ANCILLARY ONLY
Chlamydia: NEGATIVE
Neisseria Gonorrhea: NEGATIVE

## 2016-05-27 NOTE — Assessment & Plan Note (Signed)
Last EF was 45% in 2016.  Needs repeat Echo.  Will discuss at next visit in 3 months

## 2016-05-27 NOTE — Assessment & Plan Note (Signed)
As above under HTN -- declined sleep study currently.

## 2016-05-27 NOTE — Assessment & Plan Note (Signed)
Not at goal.  Much better on repeat.  We discussed his OSA and how this would help with his BP control.   He states he will "think about" going for a sleep study.  States he's felt much better since his surgery and his snoring has improved.  Would like to improve his BP with increased activity and better diet.  Already on 4 BP meds that are maxed out.

## 2016-05-27 NOTE — Assessment & Plan Note (Signed)
S/p gastric banding.  Continue modified diet.  Continue increasing his activity as tolerated.

## 2016-05-28 ENCOUNTER — Encounter: Payer: Self-pay | Admitting: Family Medicine

## 2016-08-24 ENCOUNTER — Encounter (HOSPITAL_COMMUNITY): Payer: Self-pay

## 2017-03-14 DIAGNOSIS — L730 Acne keloid: Secondary | ICD-10-CM | POA: Diagnosis not present

## 2017-03-14 DIAGNOSIS — L918 Other hypertrophic disorders of the skin: Secondary | ICD-10-CM | POA: Diagnosis not present

## 2017-04-08 ENCOUNTER — Ambulatory Visit (HOSPITAL_COMMUNITY)
Admission: EM | Admit: 2017-04-08 | Discharge: 2017-04-08 | Disposition: A | Discharge status: Home / Self Care | Payer: BLUE CROSS/BLUE SHIELD | Attending: Internal Medicine | Admitting: Internal Medicine

## 2017-04-08 ENCOUNTER — Encounter (HOSPITAL_COMMUNITY): Payer: Self-pay | Admitting: Family Medicine

## 2017-04-08 DIAGNOSIS — S39012A Strain of muscle, fascia and tendon of lower back, initial encounter: Secondary | ICD-10-CM

## 2017-04-08 MED ORDER — MELOXICAM 15 MG PO TABS
15.0000 mg | ORAL_TABLET | Freq: Every day | ORAL | 0 refills | Status: DC
Start: 2017-04-08 — End: 2017-04-08

## 2017-04-08 MED ORDER — MELOXICAM 15 MG PO TABS: 15.0000 mg | ORAL_TABLET | Freq: Every day | ORAL | 0 refills | Status: AC

## 2017-04-08 MED ORDER — CYCLOBENZAPRINE HCL 10 MG PO TABS: 10.0000 mg | ORAL_TABLET | Freq: Every day | ORAL | 0 refills | Status: DC

## 2017-04-08 NOTE — ED Provider Notes (Signed)
CSN: [id number]     Arrival date & time 04/08/17  1001 History   None    Chief Complaint  Patient presents with  . Back Pain   (Consider location/radiation/quality/duration/timing/severity/associated sxs/prior Treatment) 36 year old male comes in with 8 day history of back pain. Denies injury. States he was lounging on the couch when the pain first started. Pain was minor then, characterized as aching. Pain is on the left side of the back. He took some Aleve which helped with the pain. However, the pain continued and worsened each day. States that movement makes it worse. This morning he had some numbness sensation of the left thigh. Denies loss of bladder or bowel control, numbness of the inner thigh. Denies history of back pain.      Past Medical History:  Diagnosis Date  . Arthritis   . Asthma   . CHF (congestive heart failure) (HCC) 2008   EF 40-50%        . Hypertension   . Obesity, morbid (HCC)   . Obstructive sleep apnea    Past Surgical History:  Procedure Laterality Date  . CARDIAC CATHETERIZATION  Oct 2013   cardiac cath negative for obstructive disease -- did show end diastolic pressure secondary to systemic hypertension  . LAPAROSCOPIC GASTRIC SLEEVE RESECTION N/A 01/27/2015   Procedure: LAPAROSCOPIC GASTRIC SLEEVE RESECTION WITH UPPER ENDOSCOPY;  Surgeon: Luretha Murphy, MD;  Location: [facility]S;  Service: General;  Laterality: N/A;  . LEFT HEART CATHETERIZATION WITH CORONARY ANGIOGRAM N/A 08/03/2012   Procedure: LEFT HEART CATHETERIZATION WITH CORONARY ANGIOGRAM;  Surgeon: Tonny Bollman, MD;  Location: [facility] CATH LAB;  Service: Cardiovascular;  Laterality: N/A;  . NO PAST SURGERIES     Family History  Problem Relation Age of Onset  . Hypertension Mother   . Hypertension Father    Social History  Substance Use Topics  . Smoking status: Former Smoker    Types: Cigarettes    Quit date: 10/04/2006  . Smokeless tobacco: Never Used  . Alcohol use No    Review of Systems   Respiratory: Negative for cough, shortness of breath and wheezing.   Cardiovascular: Negative for chest pain and palpitations.  Genitourinary: Negative for decreased urine volume, difficulty urinating, discharge, dysuria, flank pain, frequency, hematuria, penile pain, penile swelling and urgency.  Musculoskeletal: Positive for back pain and myalgias. Negative for arthralgias, gait problem and joint swelling.  Neurological: Negative for numbness.    Allergies  Patient has no known allergies.  Home Medications   Prior to Admission medications   Medication Sig Start Date End Date Taking? Authorizing Provider  albuterol (PROVENTIL HFA;VENTOLIN HFA) 108 (90 BASE) MCG/ACT inhaler Inhale 2 puffs into the lungs every 4 (four) hours as needed for wheezing. 11/12/13   Tobey Grim, MD  albuterol (PROVENTIL) (2.5 MG/3ML) 0.083% nebulizer solution Take 3 mLs (2.5 mg total) by nebulization every 6 (six) hours as needed for wheezing or shortness of breath. 11/12/13   Tobey Grim, MD  amLODipine (NORVASC) 10 MG tablet TAKE 1 TABLET (10 MG TOTAL) BY MOUTH DAILY. 05/13/15   Tobey Grim, MD  amLODipine (NORVASC) 10 MG tablet Take 1 tablet (10 mg total) by mouth daily. Needs appt before any more refills or meds given 06/11/15   Tobey Grim, MD  aspirin EC 81 MG tablet Take 81 mg by mouth daily.    [provider]  carvedilol (COREG) 25 MG tablet Take 25 mg by mouth 2 (two) times daily.  11/16/13  [provider]  cyclobenzaprine (FLEXERIL) 10 MG tablet Take 1 tablet (10 mg total) by mouth at bedtime. 04/08/17   Cathie HoopsYu, Duvid Smalls V, PA-C  ipratropium (ATROVENT) 0.06 % nasal spray Place 2 sprays into both nostrils 4 (four) times daily. Patient not taking: Reported on 01/10/2015 06/09/14   Rodolph Bongorey, Evan S, MD  lisinopril (PRINIVIL,ZESTRIL) 40 MG tablet Take 40 mg by mouth 2 (two) times daily.  11/16/13   [provider]  loratadine-pseudoephedrine (CLARITIN-D 24-HOUR) 10-240 MG per 24 hr  tablet Take 1 tablet by mouth daily as needed for allergies.    [provider]  meloxicam (MOBIC) 15 MG tablet Take 1 tablet (15 mg total) by mouth daily. 04/08/17 04/18/17  Belinda FisherYu, Lyndsi Altic V, PA-C  nitroGLYCERIN (NITROSTAT) 0.4 MG SL tablet Place 1 tablet (0.4 mg total) under the tongue every 5 (five) minutes as needed for chest pain. 05/28/13   Tommie Samsook, Jayce G, DO  predniSONE (DELTASONE) 10 MG tablet Take 3 tablets (30 mg total) by mouth daily. Patient not taking: Reported on 01/10/2015 06/09/14   Rodolph Bongorey, Evan S, MD  spironolactone (ALDACTONE) 50 MG tablet Take 50 mg by mouth daily.    [provider]   Meds Ordered and Administered this Visit  Medications - No data to display  BP (!) 176/93   Pulse 69   Temp 98.1 F (36.7 C) (Oral)   Resp 18   SpO2 95%  No data found.   Physical Exam  Constitutional: He is oriented to person, place, and time. He appears well-developed and well-nourished. No distress.  HENT:  Head: Normocephalic and atraumatic.  Eyes: Conjunctivae are normal. Pupils are equal, round, and reactive to light.  Cardiovascular: Normal rate, regular rhythm and normal heart sounds.  Exam reveals no gallop and no friction rub.   No murmur heard. Pulmonary/Chest: Effort normal and breath sounds normal. He has no wheezes. He has no rales.  Musculoskeletal:  Pain on palpation of the left lower back. No bony tenderness. Full range of motion.  No tenderness of palpation of the hips. Full range of motion. Strength normal and equal bilaterally. Negative straight leg raise.  Neurological: He is alert and oriented to person, place, and time.  Skin: Skin is warm and dry.  Psychiatric: He has a normal mood and affect. His behavior is normal. Judgment normal.    Urgent Care Course     Procedures (including critical care time)  Labs Review Labs Reviewed - No data to display  Imaging Review No results found.       MDM   1. Strain of lumbar region, initial encounter     Discussed with patient history and exam most consistent with lumbar strain. Given negative straight leg raise, low suspicion for radiculopathy. Discussed with patient numbness of the thigh could be due to irritation of the nerve due to muscle strain. Given patient with trouble sleeping at night due to pain, will start Flexeril 10 mg daily at bedtime. Patient taken meloxicam for MSK pain in the past with good relief. Start meloxicam 15 mg once a day for 10 days.Side effects of medicines discussed with patient. If notice any allergies to medicine, to stop medicines and contact PCP. Follow-up with PCP if symptoms worsens or does not resolve. Patient to monitor for numbness in the inner thighs, loss of bladder or bowel control, to go to the ED immediately.   Belinda FisherYu, Saba Gomm V, PA-C 04/08/17 1146

## 2017-04-08 NOTE — ED Triage Notes (Signed)
Pt here for left lower back pain with radiation down the left leg and tingling. Denies injury.

## 2017-04-29 ENCOUNTER — Encounter: Payer: Self-pay | Admitting: Family Medicine

## 2017-04-29 ENCOUNTER — Ambulatory Visit (INDEPENDENT_AMBULATORY_CARE_PROVIDER_SITE_OTHER): Payer: BLUE CROSS/BLUE SHIELD | Admitting: Family Medicine

## 2017-04-29 VITALS — BP 150/102 | HR 73 | Temp 98.2°F | Ht 72.0 in | Wt 371.6 lb

## 2017-04-29 DIAGNOSIS — S39012D Strain of muscle, fascia and tendon of lower back, subsequent encounter: Secondary | ICD-10-CM | POA: Diagnosis not present

## 2017-04-29 DIAGNOSIS — N182 Chronic kidney disease, stage 2 (mild): Secondary | ICD-10-CM | POA: Diagnosis not present

## 2017-04-29 MED ORDER — CYCLOBENZAPRINE HCL 10 MG PO TABS: 10.0000 mg | ORAL_TABLET | Freq: Every day | ORAL | 0 refills | Status: DC

## 2017-04-29 MED ORDER — IBUPROFEN 800 MG PO TABS: 800.0000 mg | ORAL_TABLET | Freq: Three times a day (TID) | ORAL | 1 refills | Status: DC | PRN

## 2017-04-29 NOTE — Patient Instructions (Signed)
It was really good to see you today  We are checking bloodwork to check your kidneys because of the ibuprofen.   I have refilled this plus the flexeril.  Do the leg lifts and back stretches as we discussed.  Come back to see me in about 6 weeks to make sure your back is doing okay and we can look at your blood pressure and your other medical issues.

## 2017-04-29 NOTE — Progress Notes (Signed)
Subjective:    Leonard Lewis is a 36 y.o. male who presents to [facility] today for back pain:  1.  Back pain:   Describes aching pain in BL lumbar region of back, worse when moving from lying to seated/standing position.  Not worse on left versus right side. Pain is 4 / 10, some relief with flexeril and girlfriends 800 mg ibuprofen.  Does little exercise.  Initially radiated to left thigh -- none now.  Feels much better than when he presented to Urgent Care.   No LE weakness or changes in gait.  No motor weakness/decreased sensation BL LE's.  No fevers or chills.  No dysuria, hematuria, urinary frequency, incontinence of bladder or bowel.     ROS as above per HPI.    The following portions of the patient's history were reviewed and updated as appropriate: allergies, current medications, past medical history, family and social history, and problem list. Patient is a nonsmoker.    PMH reviewed.  Past Medical History:  Diagnosis Date  . Arthritis   . Asthma   . CHF (congestive heart failure) (HCC) 2008   EF 40-50%        . Hypertension   . Obesity, morbid (HCC)   . Obstructive sleep apnea    Past Surgical History:  Procedure Laterality Date  . CARDIAC CATHETERIZATION  Oct 2013   cardiac cath negative for obstructive disease -- did show end diastolic pressure secondary to systemic hypertension  . LAPAROSCOPIC GASTRIC SLEEVE RESECTION N/A 01/27/2015   Procedure: LAPAROSCOPIC GASTRIC SLEEVE RESECTION WITH UPPER ENDOSCOPY;  Surgeon: Luretha MurphyMatthew Martin, MD;  Location: [facility];  Service: General;  Laterality: N/A;  . LEFT HEART CATHETERIZATION WITH CORONARY ANGIOGRAM N/A 08/03/2012   Procedure: LEFT HEART CATHETERIZATION WITH CORONARY ANGIOGRAM;  Surgeon: Tonny BollmanMichael Cooper, MD;  [facility];  Service: Cardiovascular;  Laterality: N/A;  . NO PAST SURGERIES      Medications reviewed. Current Outpatient Prescriptions  Medication Sig Dispense Refill  . albuterol (PROVENTIL HFA;VENTOLIN HFA)  108 (90 BASE) MCG/ACT inhaler Inhale 2 puffs into the lungs every 4 (four) hours as needed for wheezing. 1 Inhaler 0  . albuterol (PROVENTIL) (2.5 MG/3ML) 0.083% nebulizer solution Take 3 mLs (2.5 mg total) by nebulization every 6 (six) hours as needed for wheezing or shortness of breath. 150 mL 1  . amLODipine (NORVASC) 10 MG tablet TAKE 1 TABLET (10 MG TOTAL) BY MOUTH DAILY. 30 tablet 0  . amLODipine (NORVASC) 10 MG tablet Take 1 tablet (10 mg total) by mouth daily. Needs appt before any more refills or meds given 30 tablet 0  . aspirin EC 81 MG tablet Take 81 mg by mouth daily.    . carvedilol (COREG) 25 MG tablet Take 25 mg by mouth 2 (two) times daily.     . cyclobenzaprine (FLEXERIL) 10 MG tablet Take 1 tablet (10 mg total) by mouth at bedtime. 10 tablet 0  . ipratropium (ATROVENT) 0.06 % nasal spray Place 2 sprays into both nostrils 4 (four) times daily. (Patient not taking: Reported on 01/10/2015) 15 mL 1  . lisinopril (PRINIVIL,ZESTRIL) 40 MG tablet Take 40 mg by mouth 2 (two) times daily.     Marland Kitchen. loratadine-pseudoephedrine (CLARITIN-D 24-HOUR) 10-240 MG per 24 hr tablet Take 1 tablet by mouth daily as needed for allergies.    . nitroGLYCERIN (NITROSTAT) 0.4 MG SL tablet Place 1 tablet (0.4 mg total) under the tongue every 5 (five) minutes as needed for chest pain. 15 tablet 1  .  predniSONE (DELTASONE) 10 MG tablet Take 3 tablets (30 mg total) by mouth daily. (Patient not taking: Reported on 01/10/2015) 15 tablet 0  . spironolactone (ALDACTONE) 50 MG tablet Take 50 mg by mouth daily.     No current facility-administered medications for this visit.      Objective:   Physical Exam BP (!) 150/102   Pulse 73   Temp 98.2 F (36.8 C) (Oral)   Ht 6' (1.829 m)   Wt (!) 371 lb 9.6 oz (168.6 kg)   SpO2 95%   BMI 50.40 kg/m  Gen:  Alert, cooperative patient who appears stated age in no acute distress.  Vital signs reviewed. HEENT: EOMI,  MMM Back:  Normal skin, Spine with normal alignment and  no deformity.  No tenderness to vertebral process palpation.  Paraspinous muscles are not tender and without spasm.   Range of motion is full at neck and decreased secondary to body habitus and lack of flexibility (but NOT pain) lumbar sacral regions.  Straight leg raise is negative for pain BL Neuro:  Sensation and motor function 5/5 bilateral lower extremities.  Patellar and Achilles  DTR's +2 patellar BL.  He is able to walk on his heels and toes without difficulty.    No results found for this or any previous visit (from the past 72 hour(s)).  1.  Back spasm: - improving.   - demonstrated some home stretches and exercises for him to start - ibuprofen 800 mg plus flexeril -- short term for both - checking creatinine today  2.  Chronic medical issues; - FU in about a month to discuss chronic issues including weight and HTN

## 2017-04-29 NOTE — Progress Notes (Signed)
back

## 2017-04-30 LAB — BASIC METABOLIC PANEL
BUN/Creatinine Ratio: 16 (ref 9–20)
BUN: 23 mg/dL — ABNORMAL HIGH (ref 6–20)
CO2: 22 mmol/L (ref 20–29)
Calcium: 9.8 mg/dL (ref 8.7–10.2)
Chloride: 102 mmol/L (ref 96–106)
Creatinine, Ser: 1.48 mg/dL — ABNORMAL HIGH (ref 0.76–1.27)
GFR calc Af Amer: 69 mL/min/{1.73_m2} (ref >59)
GFR calc non Af Amer: 60 mL/min/{1.73_m2} (ref >59)
GLUCOSE: 87 mg/dL (ref 65–99)
POTASSIUM: 5 mmol/L (ref 3.5–5.2)
Sodium: 138 mmol/L (ref 134–144)

## 2017-05-15 ENCOUNTER — Other Ambulatory Visit: Payer: Self-pay | Admitting: Family Medicine

## 2017-06-10 ENCOUNTER — Ambulatory Visit (INDEPENDENT_AMBULATORY_CARE_PROVIDER_SITE_OTHER): Payer: BLUE CROSS/BLUE SHIELD | Admitting: Family Medicine

## 2017-06-10 ENCOUNTER — Other Ambulatory Visit (HOSPITAL_COMMUNITY)
Admission: RE | Admit: 2017-06-10 | Discharge: 2017-06-10 | Disposition: A | Discharge status: Home / Self Care | Payer: BLUE CROSS/BLUE SHIELD | Source: Ambulatory Visit | Attending: Family Medicine | Admitting: Family Medicine

## 2017-06-10 ENCOUNTER — Encounter: Payer: Self-pay | Admitting: Family Medicine

## 2017-06-10 VITALS — BP 136/92 | HR 72 | Temp 98.4°F | Ht 72.0 in | Wt 370.2 lb

## 2017-06-10 DIAGNOSIS — J069 Acute upper respiratory infection, unspecified: Secondary | ICD-10-CM | POA: Diagnosis not present

## 2017-06-10 DIAGNOSIS — Z23 Encounter for immunization: Secondary | ICD-10-CM

## 2017-06-10 DIAGNOSIS — E785 Hyperlipidemia, unspecified: Secondary | ICD-10-CM

## 2017-06-10 DIAGNOSIS — Z113 Encounter for screening for infections with a predominantly sexual mode of transmission: Secondary | ICD-10-CM

## 2017-06-10 DIAGNOSIS — I10 Essential (primary) hypertension: Secondary | ICD-10-CM | POA: Diagnosis not present

## 2017-06-10 MED ORDER — CARVEDILOL 25 MG PO TABS: 25.0000 mg | ORAL_TABLET | Freq: Two times a day (BID) | ORAL | 3 refills | Status: DC

## 2017-06-10 MED ORDER — LISINOPRIL 40 MG PO TABS: 40.0000 mg | ORAL_TABLET | Freq: Every day | ORAL | 6 refills | Status: DC

## 2017-06-10 MED ORDER — BENZONATATE 200 MG PO CAPS: 200.0000 mg | ORAL_CAPSULE | Freq: Two times a day (BID) | ORAL | 0 refills | Status: DC | PRN

## 2017-06-10 MED ORDER — AMLODIPINE BESYLATE 10 MG PO TABS: 10.0000 mg | ORAL_TABLET | Freq: Every day | ORAL | 6 refills | Status: DC

## 2017-06-10 NOTE — Assessment & Plan Note (Signed)
Checking lipid panel today. 

## 2017-06-10 NOTE — Assessment & Plan Note (Signed)
Diastolic elevated -- not at goal. Last refills by me for any of his blood pressure medicines were in 2015 or 2016. I have sent in new refills for him today. Encouraged compliance.

## 2017-06-10 NOTE — Patient Instructions (Signed)
It was good to see you again today.  I have sent in refills for your blood pressure medicines.   We'll let you know the results of your lab tests.  Have a good weekend

## 2017-06-10 NOTE — Assessment & Plan Note (Signed)
Likely viral illness based on symptoms and history.  No signs of bacterial illness. Symptomatic treatment for now, see instructions. Return if worsening or no improvement in 1 week.  Tessalon perles for cough

## 2017-06-10 NOTE — Progress Notes (Signed)
Subjective:    Leonard Lewis is a 36 y.o. male who presents to [facility] today for cold symptoms:  1.  URI symptoms:  Present for about one week. Started with cough in his chest. With some mild nasal drainage from [place] and with cough. Cough is alternating productive and nonproductive. He is sleeping well at night. He has no headaches or fevers and chills. No nausea vomiting.  #2. Hypertension:   Long-term problem for this patient.  No adverse effects from medication -- though not entirely clear he is taking his medications regularly.  Not checking it regularly.  No HA, CP, dizziness, shortness of breath, palpitations, or LE swelling.  Does have known OSA and not using CPAP to help with this since weight loss surgery.   BP Readings from Last 3 Encounters:  06/10/17 (!) 136/92  04/29/17 (!) 150/102  04/08/17 (!) 176/93     ROS as above per HPI.   The following portions of the patient's history were reviewed and updated as appropriate: allergies, current medications, past medical history, family and social history, and problem list. Patient is a nonsmoker.    PMH reviewed.  Past Medical History:  Diagnosis Date  . Arthritis   . Asthma   . CHF (congestive heart failure) (HCC) 2008   EF 40-50%        . Hypertension   . Obesity, morbid (HCC)   . Obstructive sleep apnea    Past Surgical History:  Procedure Laterality Date  . CARDIAC CATHETERIZATION  Oct 2013   cardiac cath negative for obstructive disease -- did show end diastolic pressure secondary to systemic hypertension  . LAPAROSCOPIC GASTRIC SLEEVE RESECTION N/A 01/27/2015   Procedure: LAPAROSCOPIC GASTRIC SLEEVE RESECTION WITH UPPER ENDOSCOPY;  Surgeon: Luretha Murphy, MD;  Location: [facility];  Service: General;  Laterality: N/A;  . LEFT HEART CATHETERIZATION WITH CORONARY ANGIOGRAM N/A 08/03/2012   Procedure: LEFT HEART CATHETERIZATION WITH CORONARY ANGIOGRAM;  Surgeon: Tonny Bollman, MD;  Location: [facility] CATH LAB;  Service: Cardiovascular;   Laterality: N/A;  . NO PAST SURGERIES      Medications reviewed. Current Outpatient Prescriptions  Medication Sig Dispense Refill  . albuterol (PROVENTIL HFA;VENTOLIN HFA) 108 (90 BASE) MCG/ACT inhaler Inhale 2 puffs into the lungs every 4 (four) hours as needed for wheezing. 1 Inhaler 0  . albuterol (PROVENTIL) (2.5 MG/3ML) 0.083% nebulizer solution Take 3 mLs (2.5 mg total) by nebulization every 6 (six) hours as needed for wheezing or shortness of breath. 150 mL 1  . amLODipine (NORVASC) 10 MG tablet TAKE 1 TABLET (10 MG TOTAL) BY MOUTH DAILY. 30 tablet 0  . amLODipine (NORVASC) 10 MG tablet Take 1 tablet (10 mg total) by mouth daily. Needs appt before any more refills or meds given 30 tablet 0  . aspirin EC 81 MG tablet Take 81 mg by mouth daily.    . carvedilol (COREG) 25 MG tablet Take 25 mg by mouth 2 (two) times daily.     . cyclobenzaprine (FLEXERIL) 10 MG tablet TAKE 1 TABLET BY MOUTH EVERYDAY AT BEDTIME 20 tablet 0  . ibuprofen (ADVIL,MOTRIN) 800 MG tablet Take 1 tablet (800 mg total) by mouth every 8 (eight) hours as needed. 30 tablet 1  . ipratropium (ATROVENT) 0.06 % nasal spray Place 2 sprays into both nostrils 4 (four) times daily. (Patient not taking: Reported on 01/10/2015) 15 mL 1  . lisinopril (PRINIVIL,ZESTRIL) 40 MG tablet Take 40 mg by mouth 2 (two) times daily.     Marland Kitchen  loratadine-pseudoephedrine (CLARITIN-D 24-HOUR) 10-240 MG per 24 hr tablet Take 1 tablet by mouth daily as needed for allergies.    . nitroGLYCERIN (NITROSTAT) 0.4 MG SL tablet Place 1 tablet (0.4 mg total) under the tongue every 5 (five) minutes as needed for chest pain. 15 tablet 1  . predniSONE (DELTASONE) 10 MG tablet Take 3 tablets (30 mg total) by mouth daily. (Patient not taking: Reported on 01/10/2015) 15 tablet 0  . spironolactone (ALDACTONE) 50 MG tablet Take 50 mg by mouth daily.     No current facility-administered medications for this visit.      Objective:   Physical Exam BP (!) 136/92    Pulse 72   Temp 98.4 F (36.9 C) (Oral)   Ht 6' (1.829 m)   Wt (!) 370 lb 3.2 oz (167.9 kg)   SpO2 97%   BMI 50.21 kg/m  Gen:  Patient sitting on exam table, appears stated age in no acute distress Head: Normocephalic atraumatic Eyes: EOMI, PERRL, sclera and conjunctiva non-erythematous Ears:  Canals clear bilaterally.  TMs pearly gray bilaterally without erythema or bulging.   Nose:  Nares patent without drainage.   Mouth: Mucosa membranes moist. Tonsils +2, nonenlarged, non-erythematous. Neck: No cervical lymphadenopathy noted Heart:  RRR, no murmurs auscultated. Pulm:  Clear to auscultation bilaterally with good air movement.  No wheezes or rales noted.   Abd:  Soft/nondistended/nontender.   Exts: Non edematous BL  LE, warm and well perfused.   No results found for this or any previous visit (from the past 72 hour(s)).

## 2017-06-11 LAB — CBC
Hematocrit: 40.8 % (ref 37.5–51.0)
Hemoglobin: 13 g/dL (ref 13.0–17.7)
MCH: 22.6 pg — ABNORMAL LOW (ref 26.6–33.0)
MCHC: 31.9 g/dL (ref 31.5–35.7)
MCV: 71 fL — ABNORMAL LOW (ref 79–97)
PLATELETS: 288 10*3/uL (ref 150–379)
RBC: 5.76 x10E6/uL (ref 4.14–5.80)
RDW: 17.1 % — AB (ref 12.3–15.4)
WBC: 4.2 10*3/uL (ref 3.4–10.8)

## 2017-06-11 LAB — COMPREHENSIVE METABOLIC PANEL
ALBUMIN: 4 g/dL (ref 3.5–5.5)
ALK PHOS: 44 IU/L (ref 39–117)
ALT: 29 IU/L (ref 0–44)
AST: 24 IU/L (ref 0–40)
Albumin/Globulin Ratio: 1.1 — ABNORMAL LOW (ref 1.2–2.2)
BUN / CREAT RATIO: 11 (ref 9–20)
BUN: 17 mg/dL (ref 6–20)
Bilirubin Total: 0.3 mg/dL (ref 0.0–1.2)
CHLORIDE: 100 mmol/L (ref 96–106)
CO2: 23 mmol/L (ref 20–29)
Calcium: 9.7 mg/dL (ref 8.7–10.2)
Creatinine, Ser: 1.51 mg/dL — ABNORMAL HIGH (ref 0.76–1.27)
GFR calc non Af Amer: 59 mL/min/{1.73_m2} — ABNORMAL LOW (ref >59)
GFR, EST AFRICAN AMERICAN: 68 mL/min/{1.73_m2} (ref >59)
GLOBULIN, TOTAL: 3.7 g/dL (ref 1.5–4.5)
Glucose: 86 mg/dL (ref 65–99)
Potassium: 4.8 mmol/L (ref 3.5–5.2)
Sodium: 139 mmol/L (ref 134–144)
Total Protein: 7.7 g/dL (ref 6.0–8.5)

## 2017-06-11 LAB — LIPID PANEL
CHOLESTEROL TOTAL: 183 mg/dL (ref 100–199)
Chol/HDL Ratio: 4.9 ratio (ref 0.0–5.0)
HDL: 37 mg/dL — AB (ref >39)
LDL Calculated: 114 mg/dL — ABNORMAL HIGH (ref 0–99)
TRIGLYCERIDES: 160 mg/dL — AB (ref 0–149)
VLDL CHOLESTEROL CAL: 32 mg/dL (ref 5–40)

## 2017-06-11 LAB — HIV ANTIBODY (ROUTINE TESTING W REFLEX): HIV Screen 4th Generation wRfx: NONREACTIVE

## 2017-06-11 LAB — RPR: RPR Ser Ql: NONREACTIVE

## 2017-06-13 LAB — URINE CYTOLOGY ANCILLARY ONLY
Chlamydia: NEGATIVE
Neisseria Gonorrhea: NEGATIVE

## 2017-07-18 ENCOUNTER — Ambulatory Visit (INDEPENDENT_AMBULATORY_CARE_PROVIDER_SITE_OTHER): Payer: BLUE CROSS/BLUE SHIELD

## 2017-07-18 ENCOUNTER — Ambulatory Visit (HOSPITAL_COMMUNITY)
Admission: EM | Admit: 2017-07-18 | Discharge: 2017-07-18 | Disposition: A | Discharge status: Home / Self Care | Payer: BLUE CROSS/BLUE SHIELD | Attending: Emergency Medicine | Admitting: Emergency Medicine

## 2017-07-18 ENCOUNTER — Encounter (HOSPITAL_COMMUNITY): Payer: Self-pay | Admitting: Emergency Medicine

## 2017-07-18 DIAGNOSIS — M79672 Pain in left foot: Secondary | ICD-10-CM

## 2017-07-18 DIAGNOSIS — M2142 Flat foot [pes planus] (acquired), left foot: Secondary | ICD-10-CM

## 2017-07-18 DIAGNOSIS — M2141 Flat foot [pes planus] (acquired), right foot: Secondary | ICD-10-CM

## 2017-07-18 DIAGNOSIS — S93602A Unspecified sprain of left foot, initial encounter: Secondary | ICD-10-CM

## 2017-07-18 NOTE — ED Triage Notes (Signed)
Left foot pain that started yesterday.  No known injury.  Patient wore different shoes for 3 hours, but no other difference.  No pain while wearing different shoes.  Foot pain is worsening

## 2017-07-18 NOTE — Discharge Instructions (Signed)
Ice and elevation. Limited weightbearing for a few days. Follow-up with podiatry.

## 2017-07-18 NOTE — ED Provider Notes (Signed)
[facility]    CSN: [id number] Arrival date & time: 07/18/17  1006     History   Chief Complaint Chief Complaint  Patient presents with  . Foot Pain    HPI GRAYLEN NOBOA is a 36 y.o. male.   36 year old male complaining of left foot pain for about 24 hours. Relatively gradual onset yesterday. The pain is located to the dorsum of the foot along the mid metatarsals. Pain is worse with weightbearing. Rest and nonweightbearing seems to make it better. Ibuprofen does not help, neither does muscle relaxant. He is had similar pain to that foot in the past but only lasted one day before resolving. No known injury.        Past Medical History:  Diagnosis Date  . Arthritis   . Asthma   . CHF (congestive heart failure) (HCC) 2008   EF 40-50%        . Hypertension   . Obesity, morbid (HCC)   . Obstructive sleep apnea     Patient Active Problem List   Diagnosis Date Noted  . Viral upper respiratory illness 06/10/2017  . 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 Chronic systolic heart failure (HCC) 10/30/2013  . Obstructive sleep apnea 05/31/2007  . HYPERTENSION, BENIGN ESSENTIAL 05/02/2007  . HLD (hyperlipidemia) 03/17/2007  . Morbid obesity (HCC) 03/17/2007    Past Surgical History:  Procedure Laterality Date  . CARDIAC CATHETERIZATION  Oct 2013   cardiac cath negative for obstructive disease -- did show end diastolic pressure secondary to systemic hypertension  . LAPAROSCOPIC GASTRIC SLEEVE RESECTION N/A 01/27/2015   Procedure: LAPAROSCOPIC GASTRIC SLEEVE RESECTION WITH UPPER ENDOSCOPY;  Surgeon: Luretha Murphy, MD;  Location: [facility];  Service: General;  Laterality: N/A;  . LEFT HEART CATHETERIZATION WITH CORONARY ANGIOGRAM N/A 08/03/2012   Procedure: LEFT HEART CATHETERIZATION WITH CORONARY ANGIOGRAM;  Surgeon: Tonny Bollman, MD;  Location: [facility] CATH LAB;  Service: Cardiovascular;  Laterality: N/A;  . NO PAST SURGERIES         Home  Medications    Prior to Admission medications   Medication Sig Start Date End Date Taking? Authorizing Provider  albuterol (PROVENTIL HFA;VENTOLIN HFA) 108 (90 BASE) MCG/ACT inhaler Inhale 2 puffs into the lungs every 4 (four) hours as needed for wheezing. 11/12/13   Tobey Grim, MD  albuterol (PROVENTIL) (2.5 MG/3ML) 0.083% nebulizer solution Take 3 mLs (2.5 mg total) by nebulization every 6 (six) hours as needed for wheezing or shortness of breath. 11/12/13   Tobey Grim, MD  amLODipine (NORVASC) 10 MG tablet Take 1 tablet (10 mg total) by mouth daily. Needs appt before any more refills or meds given 06/10/17   Tobey Grim, MD  aspirin EC 81 MG tablet Take 81 mg by mouth daily.    [provider]  benzonatate (TESSALON) 200 MG capsule Take 1 capsule (200 mg total) by mouth 2 (two) times daily as needed for cough. 06/10/17   Tobey Grim, MD  carvedilol (COREG) 25 MG tablet Take 1 tablet (25 mg total) by mouth 2 (two) times daily. 06/10/17   Tobey Grim, MD  cyclobenzaprine (FLEXERIL) 10 MG tablet TAKE 1 TABLET BY MOUTH EVERYDAY AT BEDTIME 05/16/17   Tobey Grim, MD  ibuprofen (ADVIL,MOTRIN) 800 MG tablet Take 1 tablet (800 mg total) by mouth every 8 (eight) hours as needed. 04/29/17   Tobey Grim, MD  lisinopril (PRINIVIL,ZESTRIL) 40 MG tablet Take 1 tablet (40 mg total) by  mouth daily. 06/10/17   Tobey Grim, MD  nitroGLYCERIN (NITROSTAT) 0.4 MG SL tablet Place 1 tablet (0.4 mg total) under the tongue every 5 (five) minutes as needed for chest pain. 05/28/13   Tommie Sams, DO  spironolactone (ALDACTONE) 50 MG tablet Take 50 mg by mouth daily.    [provider]    Family History Family History  Problem Relation Age of Onset  . Hypertension Mother   . Hypertension Father     Social History Social History  Substance Use Topics  . Smoking status: Former Smoker    Types: Cigarettes    Quit date: 10/04/2006  . Smokeless tobacco: Never Used   . Alcohol use No     Allergies   Patient has no known allergies.   Review of Systems Review of Systems  Constitutional: Negative.   Respiratory: Negative.   Gastrointestinal: Negative.   Genitourinary: Negative.   Musculoskeletal:       As per HPI  Skin: Negative.   Neurological: Negative for numbness.     Physical Exam Triage Vital Signs ED Triage Vitals  Enc Vitals Group     BP 07/18/17 1034 125/84     Pulse Rate 07/18/17 1034 74     Resp 07/18/17 1034 (!) 24     Temp 07/18/17 1034 97.9 F (36.6 C)     Temp Source 07/18/17 1034 Oral     SpO2 07/18/17 1034 96 %     Weight --      Height --      Head Circumference --      Peak Flow --      Pain Score 07/18/17 1033 8     Pain Loc --      Pain Edu? --      Excl. in GC? --    No data found.   Updated Vital Signs BP 125/84 (BP Location: Left Arm) Comment (BP Location): large cuff  Pulse 74   Temp 97.9 F (36.6 C) (Oral)   Resp (!) 24   SpO2 96%   Visual Acuity Right Eye Distance:   Left Eye Distance:   Bilateral Distance:    Right Eye Near:   Left Eye Near:    Bilateral Near:     Physical Exam  Constitutional: He is oriented to person, place, and time. He appears well-developed and well-nourished.  HENT:  Head: Normocephalic and atraumatic.  Eyes: EOM are normal. Left eye exhibits no discharge.  Neck: Normal range of motion. Neck supple.  Pulmonary/Chest: Effort normal.  Musculoskeletal: He exhibits no deformity.  On inspection is noticed that patient has pes planus. There is minor swelling to the dorsal midfoot. Tenderness in the same area extending along the mid metatarsals. Palpation of the plantar aspect of the foot does not produce pain to that area however pressure applied to the plantar aspect of the arch produces pain in the dorsal aspect of the foot. No deformity. No discoloration. Distal neurovascular motor sensory grossly intact. Pedal pulse 2+.  Neurological: He is alert and oriented to  person, place, and time. No cranial nerve deficit.  Skin: Skin is warm and dry.  Psychiatric: He has a normal mood and affect.  Nursing note and vitals reviewed.    UC Treatments / Results  Labs (all labs ordered are listed, but only abnormal results are displayed) Labs Reviewed - No data to display  EKG  EKG Interpretation None       Radiology Dg Foot Complete Left  Result Date: 07/18/2017 CLINICAL DATA:  Left foot pain.  No known injury. EXAM: LEFT FOOT - COMPLETE 3+ VIEW COMPARISON:  05/17/2011 FINDINGS: Old healed left fifth proximal phalangeal fracture. No acute fracture, subluxation or dislocation. Joint spaces are maintained. Soft tissues are intact. Small plantar calcaneal spur. IMPRESSION: No acute bony abnormality. Electronically Signed   By: Charlett Nose M.D.   On: 07/18/2017 11:28    Procedures Procedures (including critical care time)  Medications Ordered in UC Medications - No data to display   Initial Impression / Assessment and Plan / UC Course  I have reviewed the triage vital signs and the nursing notes.  Pertinent labs & imaging results that were available during my care of the patient were reviewed by me and considered in my medical decision making (see chart for details).       Final Clinical Impressions(s) / UC Diagnoses   Final diagnoses:  Foot pain, left  Pes planus of both feet  Sprain of left foot, initial encounter    New Prescriptions New Prescriptions   No medications on file     Controlled Substance Prescriptions [place] Controlled Substance Registry consulted? Not Applicable   Hayden Rasmussen, NP 07/18/17 1209

## 2017-07-20 ENCOUNTER — Ambulatory Visit (INDEPENDENT_AMBULATORY_CARE_PROVIDER_SITE_OTHER): Payer: BLUE CROSS/BLUE SHIELD | Admitting: Podiatry

## 2017-07-20 ENCOUNTER — Encounter: Payer: Self-pay | Admitting: Podiatry

## 2017-07-20 VITALS — BP 163/101 | HR 91 | Resp 16 | Ht 72.0 in | Wt 370.0 lb

## 2017-07-20 DIAGNOSIS — M7752 Other enthesopathy of left foot: Secondary | ICD-10-CM | POA: Diagnosis not present

## 2017-07-20 DIAGNOSIS — M779 Enthesopathy, unspecified: Secondary | ICD-10-CM

## 2017-07-20 DIAGNOSIS — R609 Edema, unspecified: Secondary | ICD-10-CM | POA: Diagnosis not present

## 2017-07-20 MED ORDER — METHYLPREDNISOLONE 4 MG PO TBPK: ORAL_TABLET | ORAL | 0 refills | Status: DC

## 2017-07-20 NOTE — Progress Notes (Signed)
Subjective:    Patient ID: Leonard Lewis, male    DOB: 31-May-1981, 36 y.o.   MRN: [id number]  HPI 36 year old male presents the office today for concerns of pain to the top of his left foot as well as swelling. He states that he is unsure when this started he denies any specific injury or trauma. He to urgent care and he was told he may have a stress fracture. He was not placed into a boot or brace he was given no medication was told to take over-the-counter medication apparently. He states it is difficult to put weight on his left foot given the pain. He denies any redness or warmth to his feet. No numbness or tingling. No other complaints today.   Review of Systems  All other systems reviewed and are negative.  Past Medical History:  Diagnosis Date  . Arthritis   . Asthma   . CHF (congestive heart failure) ([place]) 2008   EF 40-50%        . Hypertension   . Obesity, morbid ([place])   . Obstructive sleep apnea     Past Surgical History:  Procedure Laterality Date  . CARDIAC CATHETERIZATION  Oct 2013   cardiac cath negative for obstructive disease -- did show end diastolic pressure secondary to systemic hypertension  . LAPAROSCOPIC GASTRIC SLEEVE RESECTION N/A 01/27/2015   Procedure: LAPAROSCOPIC GASTRIC SLEEVE RESECTION WITH UPPER ENDOSCOPY;  Surgeon: Johnathan Hausen, MD;  Location: [facility];  Service: General;  Laterality: N/A;  . LEFT HEART CATHETERIZATION WITH CORONARY ANGIOGRAM N/A 08/03/2012   Procedure: LEFT HEART CATHETERIZATION WITH CORONARY ANGIOGRAM;  Surgeon: Sherren Mocha, MD;  Location: [facility] CATH LAB;  Service: Cardiovascular;  Laterality: N/A;  . NO PAST SURGERIES       Current Outpatient Prescriptions:  .  albuterol (PROVENTIL HFA;VENTOLIN HFA) 108 (90 BASE) MCG/ACT inhaler, Inhale 2 puffs into the lungs every 4 (four) hours as needed for wheezing., Disp: 1 Inhaler, Rfl: 0 .  albuterol (PROVENTIL) (2.5 MG/3ML) 0.083% nebulizer solution, Take 3 mLs (2.5 mg total) by  nebulization every 6 (six) hours as needed for wheezing or shortness of breath., Disp: 150 mL, Rfl: 1 .  amLODipine (NORVASC) 10 MG tablet, Take 1 tablet (10 mg total) by mouth daily. Needs appt before any more refills or meds given, Disp: 30 tablet, Rfl: 6 .  aspirin EC 81 MG tablet, Take 81 mg by mouth daily., Disp: , Rfl:  .  benzonatate (TESSALON) 200 MG capsule, Take 1 capsule (200 mg total) by mouth 2 (two) times daily as needed for cough., Disp: 20 capsule, Rfl: 0 .  carvedilol (COREG) 25 MG tablet, Take 1 tablet (25 mg total) by mouth 2 (two) times daily., Disp: 60 tablet, Rfl: 3 .  cyclobenzaprine (FLEXERIL) 10 MG tablet, TAKE 1 TABLET BY MOUTH EVERYDAY AT BEDTIME, Disp: 20 tablet, Rfl: 0 .  ibuprofen (ADVIL,MOTRIN) 800 MG tablet, Take 1 tablet (800 mg total) by mouth every 8 (eight) hours as needed., Disp: 30 tablet, Rfl: 1 .  lisinopril (PRINIVIL,ZESTRIL) 40 MG tablet, Take 1 tablet (40 mg total) by mouth daily., Disp: 30 tablet, Rfl: 6 .  nitroGLYCERIN (NITROSTAT) 0.4 MG SL tablet, Place 1 tablet (0.4 mg total) under the tongue every 5 (five) minutes as needed for chest pain., Disp: 15 tablet, Rfl: 1 .  spironolactone (ALDACTONE) 50 MG tablet, Take 50 mg by mouth daily., Disp: , Rfl:  .  methylPREDNISolone (MEDROL DOSEPAK) 4 MG TBPK tablet, Take as directed, Disp:  21 tablet, Rfl: 0  No Known Allergies  Social History   Social History  . Marital status: Single    Spouse name: N/A  . Number of children: N/A  . Years of education: N/A   Occupational History  . Not on file.   Social History Main Topics  . Smoking status: Former Smoker    Types: Cigarettes    Quit date: 10/04/2006  . Smokeless tobacco: Never Used  . Alcohol use No  . Drug use: No  . Sexual activity: Not on file   Other Topics Concern  . 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         Objective:   Physical Exam General: AAO x3, NAD  Dermatological: Skin is warm,  dry and supple bilateral. Nails x 10 are well manicured; remaining integument appears unremarkable at this time. There are no open sores, no preulcerative lesions, no rash or signs of infection present.  Vascular: Dorsalis Pedis artery and Posterior Tibial artery pedal pulses are 2/4 bilateral with immedate capillary fill time. No varicosities and no lower extremity edema present bilateral. There is no pain with calf compression, swelling, warmth, erythema.   Neruologic: Grossly intact via light touch bilateral.  Protective threshold with Semmes Wienstein monofilament intact to all pedal sites bilateral.   Musculoskeletal: There is mild tenderness along the dorsal lateral to central aspect of his left foot. There is tenderness upon Chander dorsiflexes digits. Extensor tendons appear to be intact there is localized swelling along this area there is no erythema or increase in warmth. There is no specific area pinpoint bony tenderness or pain the vibratory sensation. MMT 4/5 in dorsiflexion the left foot the 5/5 otherwise. Ankle, subtalar joint range of motion intact. There is no pain on the Achilles tendon and plantar fascia.  Gait: Unassisted, Nonantalgic.      Assessment & Plan:  36 year old male extensor tendinitis left side, swelling -Treatment options discussed including all alternatives, risks, and complications -Etiology of symptoms were discussed -Previous x-rays from urgent care reviewed with the patient. -Given his pain level and difficulty to dorsiflex at the digits have recommended immobilization in a cam boot at this point. This was dispensed and he agrees to this this.  -Medrol dose pack -Ice -Discussed that if symptoms continue possible MRI but hopefully mobilization and steroids will help with this. -Follow up as scheduled or sooner if needed.  Celesta Gentile, DPM

## 2017-08-08 ENCOUNTER — Ambulatory Visit: Payer: BLUE CROSS/BLUE SHIELD | Admitting: Podiatry

## 2017-08-23 ENCOUNTER — Encounter (HOSPITAL_COMMUNITY): Payer: Self-pay

## 2017-12-08 IMAGING — DX DG FOOT COMPLETE 3+V*L*
3 series · 3 of 3 positions shown · non-contrast
Comparison: 05/17/2011

CLINICAL DATA: Left foot pain.  No known injury.

EXAM:
LEFT FOOT - COMPLETE 3+ VIEW

[foot ap]
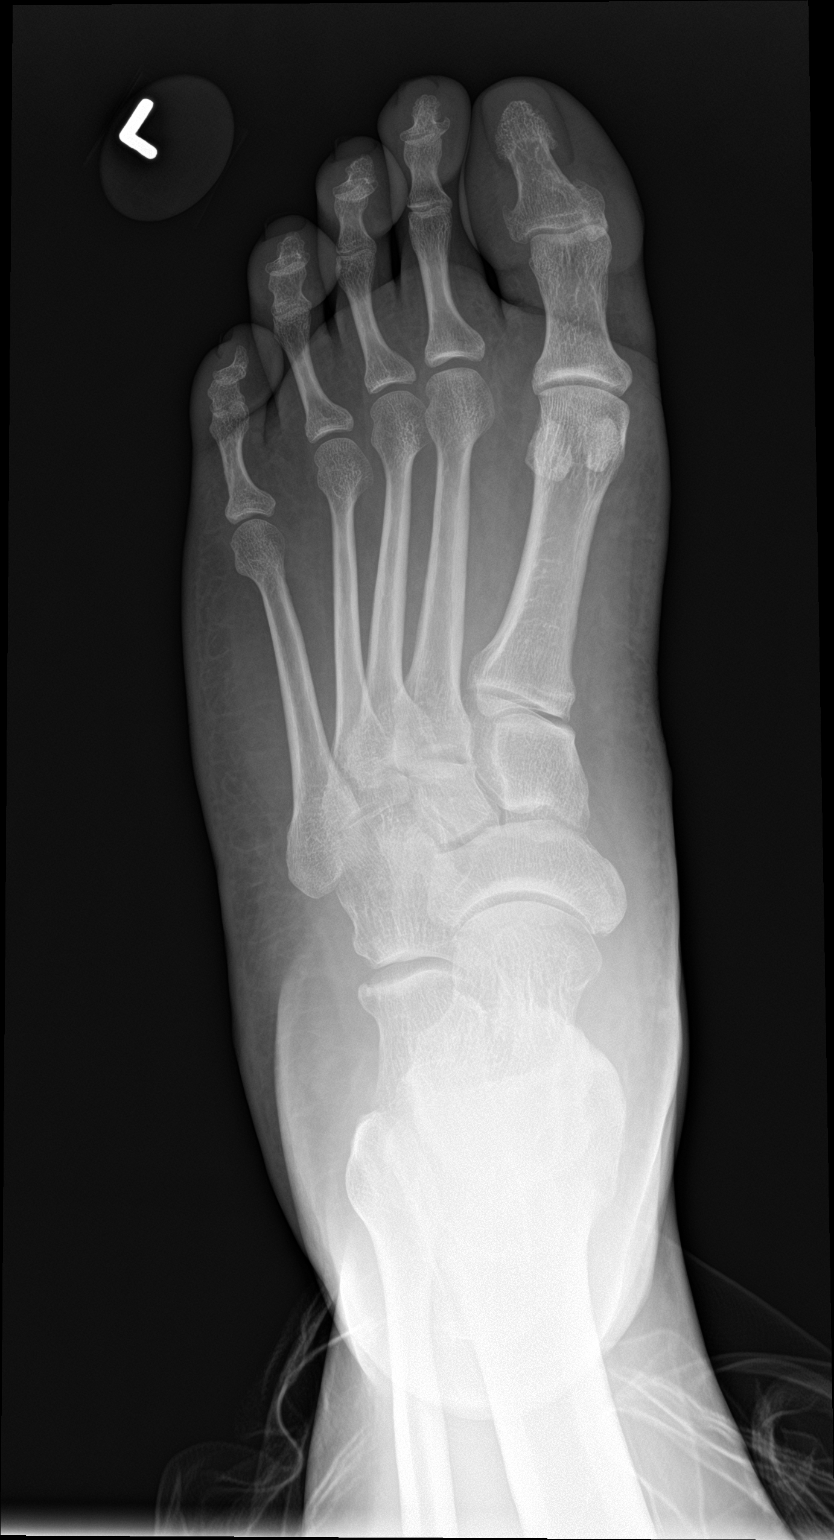

[foot obl]
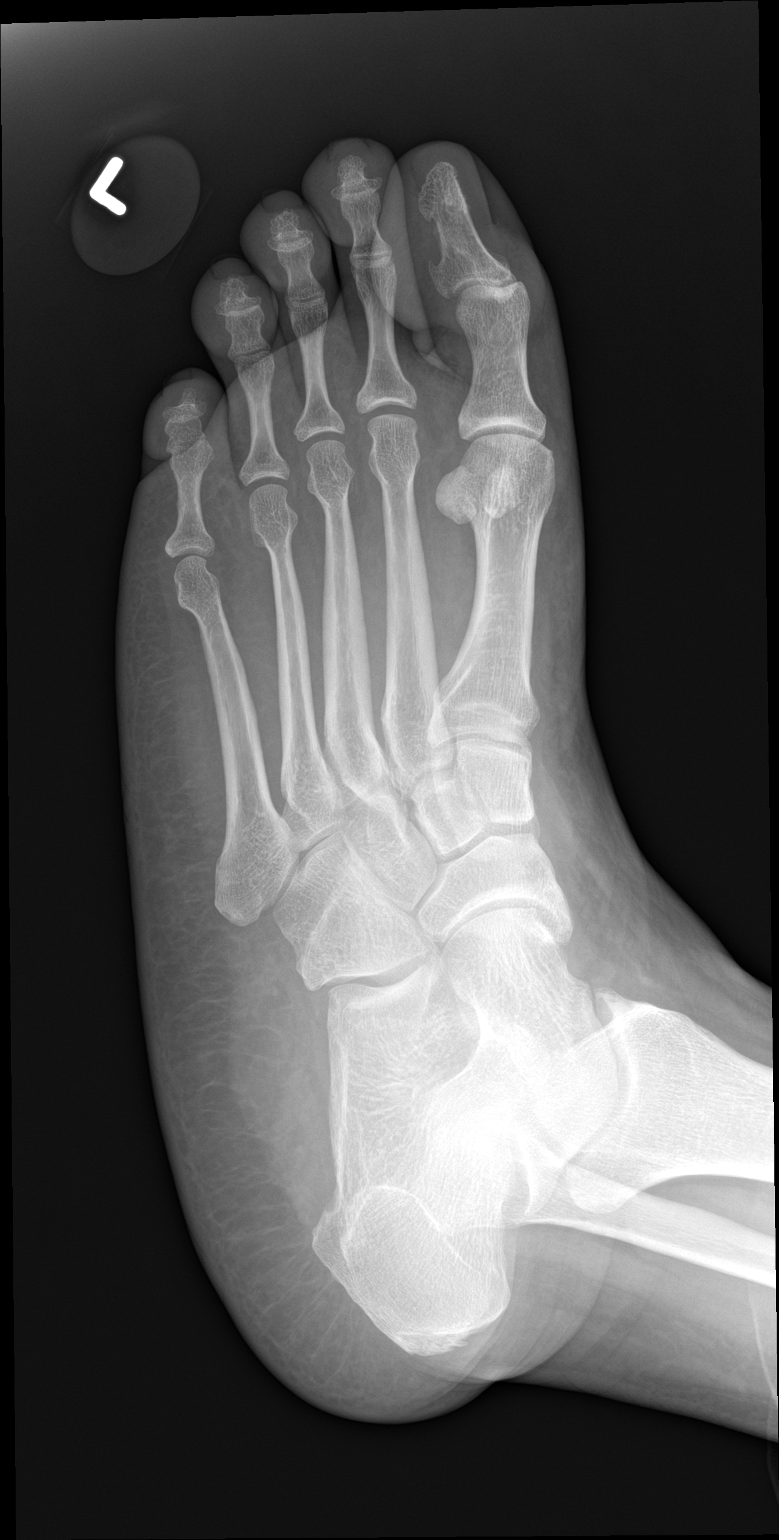

[foot lat]
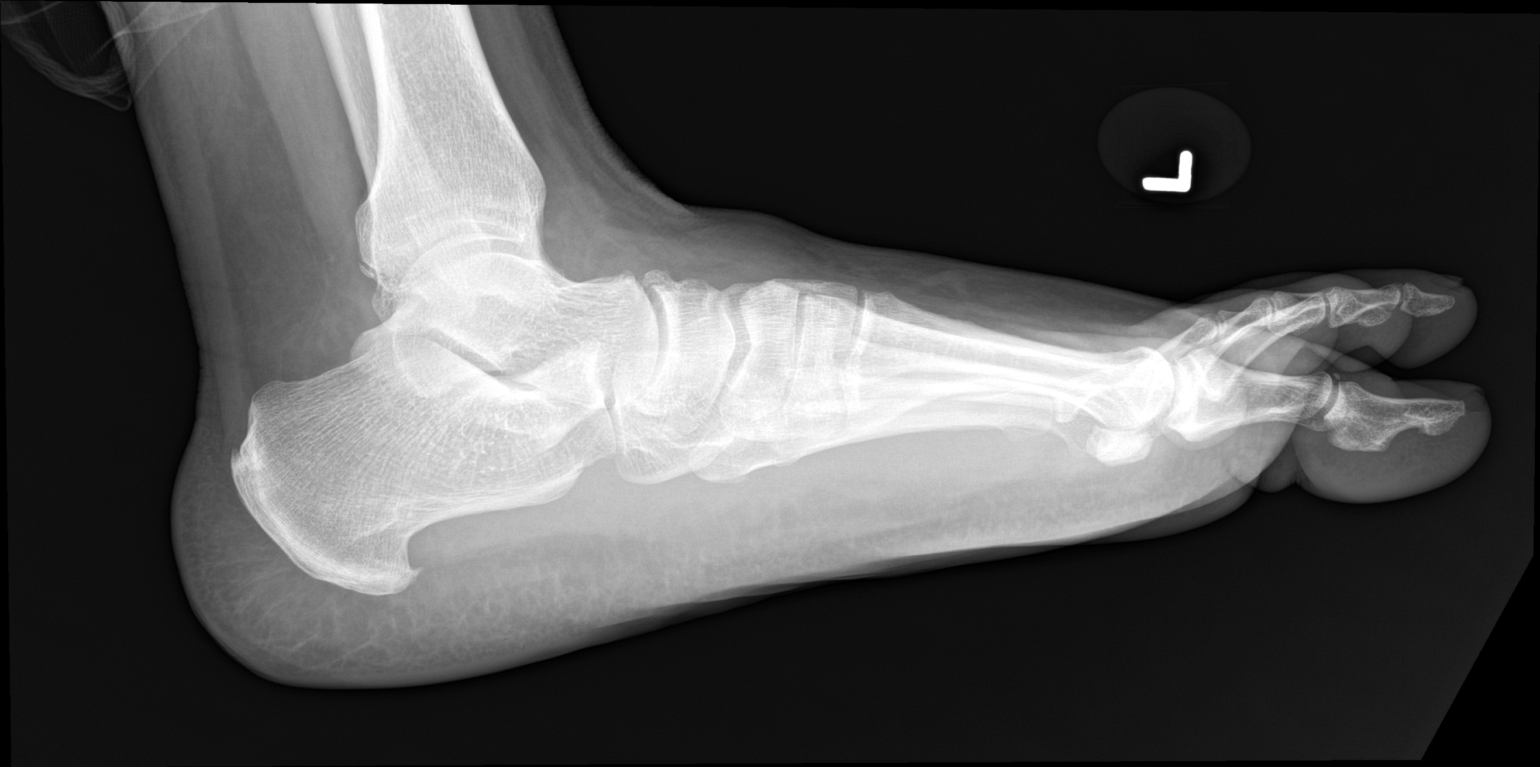

[3 of 3 positions shown; findings below may reference images not displayed]

FINDINGS: Old healed left fifth proximal phalangeal fracture. No acute
fracture, subluxation or dislocation. Joint spaces are maintained.
Soft tissues are intact. Small plantar calcaneal spur.
IMPRESSION: No acute bony abnormality.

## 2017-12-29 DIAGNOSIS — M79606 Pain in leg, unspecified: Secondary | ICD-10-CM | POA: Diagnosis not present

## 2017-12-29 DIAGNOSIS — M79604 Pain in right leg: Secondary | ICD-10-CM | POA: Diagnosis not present

## 2018-01-03 ENCOUNTER — Other Ambulatory Visit: Payer: Self-pay

## 2018-01-03 ENCOUNTER — Encounter: Payer: Self-pay | Admitting: Family Medicine

## 2018-01-03 ENCOUNTER — Ambulatory Visit (INDEPENDENT_AMBULATORY_CARE_PROVIDER_SITE_OTHER): Payer: BLUE CROSS/BLUE SHIELD | Admitting: Family Medicine

## 2018-01-03 VITALS — BP 126/80 | HR 77 | Temp 97.9°F | Ht 72.0 in | Wt 390.4 lb

## 2018-01-03 DIAGNOSIS — R7303 Prediabetes: Secondary | ICD-10-CM | POA: Diagnosis not present

## 2018-01-03 LAB — POCT GLYCOSYLATED HEMOGLOBIN (HGB A1C): Hemoglobin A1C: 5.6

## 2018-01-03 NOTE — Patient Instructions (Addendum)
It was a pleasure to see you today! Thank you for choosing [facility] for your primary care. Leonard SimpersGary T Musco was seen for posterior ankle pain. Come back to the clinic if you have any new concerns, and go to the emergency room if you have life threatening problems.  Today we talked about your ankle pain and how it relates to your health goals.  We're referring you to the weight management clinic here.  We're also suggesting you rest the ankle a bit and only do activities that don't hurt.  You can supplement rest with ice/tylenol/ibuprofen.  If we did any lab work today, and the results require attention, either me or my nurse will get in touch with you. If everything is normal, you will get a letter in mail and a message via . If you don't hear from us in two weeks, please give us a call. Otherwise, we look forward to seeing you again at your next visit. If you have any questions or concerns before then, please call the clinic at [phone].  Please bring all your medications to every doctors visit  Sign up for My Chart to have easy access to your labs results, and communication with your Primary care physician.    Please check-out at the front desk before leaving the clinic.    Best,  Dr. Marthenia RollingScott Stclair Szymborski FAMILY MEDICINE RESIDENT - PGY1 01/03/2018 11:56 AM

## 2018-01-03 NOTE — Assessment & Plan Note (Addendum)
S/p gastric banding.   He was up as high as 430 prior to banding then decreased to as low as 350.   390 today.  Admits he has been off his diet plan due to stress, would like to get back ont rack as he knows it's contributing to his foot/ankle pain  Will refer to weight management

## 2018-01-03 NOTE — Assessment & Plan Note (Signed)
Last A1c 6.1 from years ago.   5.6 today.  Patient should hopefully improve even more with planned weight management referral

## 2018-01-03 NOTE — Progress Notes (Signed)
    Subjective:  Leonard SimpersGary T Taboada is a 37 y.o. male who [facility] with a chief complaint of right ankle pain.   Pain came on about 2wks ago with no specific event triggering.  It is posterior to ankle app. At insertion of gastroc to achiles tendon.  There is no pain/swelling at bony joint of ankle.  There is no pain at bridge of foot (despite urgent care notes describing this, patient denies today).  With weighted extension of foot, there is pain at tendon/muscle of calf.  Distal perfusion/sensation/motor control is intact.   He has been off his diet for the last 6 months or so her says and his weight has been climbing again.  He had been largely inactive until a few weeks ago when he started classes and started an aburpt increase in his activity walking around campus and using stairs.     Objective:  Physical Exam: BP 126/80   Pulse 77   Temp 97.9 F (36.6 C) (Oral)   Ht 6' (1.829 m)   Wt (!) 390 lb 6.4 oz (177.1 kg)   SpO2 95%   BMI 52.95 kg/m   Gen: NAD, conversing comfortably, obese CV: RRR with no murmurs appreciated Pulm: NWOB, CTAB with no crackles, wheezes, or rhonchi GI: Normal bowel sounds present. Soft, Nontender, Nondistended. MSK:  Pain is posterior to ankle app. At insertion of gastroc to achiles tendon.  There is no pain/swelling at bony joint of ankle.  There is no pain at bridge of foot (despite urgent care notes describing this, patient denies today).  With weighted extension of foot, there is pain at tendon/muscle of calf.  Distal perfusion/sensation/motor control is intact.  Skin: warm, dry Neuro: grossly normal, moves all extremities Psych: Normal affect and thought content  Results for orders placed or performed in visit on 01/03/18 (from the past 72 hour(s))  HgB A1c     Status: None   Collection Time: 01/03/18 11:59 AM  Result Value Ref Range   Hemoglobin A1C 5.6      Assessment/Plan:  Prediabetes Last A1c 6.1 from years ago.   5.6  today.  Patient should hopefully improve even more with planned weight management referral  Morbid obesity S/p gastric banding.   He was up as high as 430 prior to banding then decreased to as low as 350.   390 today.  Admits he has been off his diet plan due to stress, would like to get back ont rack as he knows it's contributing to his foot/ankle pain  Will refer to weight management  Marthenia RollingScott Jovann Luse, DO FAMILY MEDICINE RESIDENT - PGY1 01/03/2018 1:34 PM

## 2018-02-09 ENCOUNTER — Encounter (INDEPENDENT_AMBULATORY_CARE_PROVIDER_SITE_OTHER): Payer: BLUE CROSS/BLUE SHIELD

## 2018-02-14 ENCOUNTER — Ambulatory Visit (INDEPENDENT_AMBULATORY_CARE_PROVIDER_SITE_OTHER): Payer: BLUE CROSS/BLUE SHIELD | Admitting: Family Medicine

## 2018-02-15 DIAGNOSIS — F509 Eating disorder, unspecified: Secondary | ICD-10-CM | POA: Diagnosis not present

## 2018-02-16 ENCOUNTER — Encounter (INDEPENDENT_AMBULATORY_CARE_PROVIDER_SITE_OTHER): Payer: Self-pay | Admitting: Family Medicine

## 2018-02-16 ENCOUNTER — Ambulatory Visit (INDEPENDENT_AMBULATORY_CARE_PROVIDER_SITE_OTHER): Payer: BLUE CROSS/BLUE SHIELD | Admitting: Family Medicine

## 2018-02-16 VITALS — BP 128/83 | HR 72 | Temp 98.1°F | Ht 71.0 in | Wt 387.0 lb

## 2018-02-16 DIAGNOSIS — R7303 Prediabetes: Secondary | ICD-10-CM | POA: Diagnosis not present

## 2018-02-16 DIAGNOSIS — Z9189 Other specified personal risk factors, not elsewhere classified: Secondary | ICD-10-CM | POA: Diagnosis not present

## 2018-02-16 DIAGNOSIS — Z9884 Bariatric surgery status: Secondary | ICD-10-CM

## 2018-02-16 DIAGNOSIS — R5383 Other fatigue: Secondary | ICD-10-CM

## 2018-02-16 DIAGNOSIS — R0602 Shortness of breath: Secondary | ICD-10-CM

## 2018-02-16 DIAGNOSIS — I10 Essential (primary) hypertension: Secondary | ICD-10-CM

## 2018-02-16 DIAGNOSIS — Z1331 Encounter for screening for depression: Secondary | ICD-10-CM

## 2018-02-16 DIAGNOSIS — Z0289 Encounter for other administrative examinations: Secondary | ICD-10-CM

## 2018-02-16 DIAGNOSIS — Z6841 Body Mass Index (BMI) 40.0 and over, adult: Secondary | ICD-10-CM

## 2018-02-17 LAB — COMPREHENSIVE METABOLIC PANEL
ALK PHOS: 52 IU/L (ref 39–117)
ALT: 24 IU/L (ref 0–44)
AST: 22 IU/L (ref 0–40)
Albumin/Globulin Ratio: 1.3 (ref 1.2–2.2)
Albumin: 4.1 g/dL (ref 3.5–5.5)
BUN/Creatinine Ratio: 11 (ref 9–20)
BUN: 15 mg/dL (ref 6–20)
Bilirubin Total: 0.3 mg/dL (ref 0.0–1.2)
CALCIUM: 9.8 mg/dL (ref 8.7–10.2)
CO2: 24 mmol/L (ref 20–29)
Chloride: 101 mmol/L (ref 96–106)
Creatinine, Ser: 1.39 mg/dL — ABNORMAL HIGH (ref 0.76–1.27)
GFR calc Af Amer: 74 mL/min/{1.73_m2} (ref >59)
GFR, EST NON AFRICAN AMERICAN: 64 mL/min/{1.73_m2} (ref >59)
Globulin, Total: 3.2 g/dL (ref 1.5–4.5)
Glucose: 89 mg/dL (ref 65–99)
Potassium: 4.4 mmol/L (ref 3.5–5.2)
Sodium: 138 mmol/L (ref 134–144)
Total Protein: 7.3 g/dL (ref 6.0–8.5)

## 2018-02-17 LAB — CBC WITH DIFFERENTIAL
Basophils Absolute: 0 10*3/uL (ref 0.0–0.2)
Basos: 1 %
EOS (ABSOLUTE): 0.2 10*3/uL (ref 0.0–0.4)
Eos: 4 %
Hematocrit: 42.1 % (ref 37.5–51.0)
Hemoglobin: 13.5 g/dL (ref 13.0–17.7)
IMMATURE GRANULOCYTES: 0 %
Immature Grans (Abs): 0 10*3/uL (ref 0.0–0.1)
LYMPHS: 37 %
Lymphocytes Absolute: 2.2 10*3/uL (ref 0.7–3.1)
MCH: 22.8 pg — ABNORMAL LOW (ref 26.6–33.0)
MCHC: 32.1 g/dL (ref 31.5–35.7)
MCV: 71 fL — ABNORMAL LOW (ref 79–97)
MONOS ABS: 0.5 10*3/uL (ref 0.1–0.9)
Monocytes: 9 %
NEUTROS PCT: 49 %
Neutrophils Absolute: 2.9 10*3/uL (ref 1.4–7.0)
RBC: 5.93 x10E6/uL — AB (ref 4.14–5.80)
RDW: 16.5 % — ABNORMAL HIGH (ref 12.3–15.4)
WBC: 5.9 10*3/uL (ref 3.4–10.8)

## 2018-02-17 LAB — LIPID PANEL WITH LDL/HDL RATIO
CHOLESTEROL TOTAL: 179 mg/dL (ref 100–199)
HDL: 42 mg/dL (ref >39)
LDL Calculated: 112 mg/dL — ABNORMAL HIGH (ref 0–99)
LDl/HDL Ratio: 2.7 ratio (ref 0.0–3.6)
Triglycerides: 123 mg/dL (ref 0–149)
VLDL CHOLESTEROL CAL: 25 mg/dL (ref 5–40)

## 2018-02-17 LAB — INSULIN, RANDOM: INSULIN: 21.9 u[IU]/mL (ref 2.6–24.9)

## 2018-02-17 LAB — TSH: TSH: 1.6 u[IU]/mL (ref 0.450–4.500)

## 2018-02-17 LAB — HEMOGLOBIN A1C
ESTIMATED AVERAGE GLUCOSE: 117 mg/dL
HEMOGLOBIN A1C: 5.7 % — AB (ref 4.8–5.6)

## 2018-02-17 LAB — VITAMIN B12: Vitamin B-12: 1105 pg/mL (ref 232–1245)

## 2018-02-17 LAB — VITAMIN D 25 HYDROXY (VIT D DEFICIENCY, FRACTURES): VIT D 25 HYDROXY: 14 ng/mL — AB (ref 30.0–100.0)

## 2018-02-17 LAB — T3: T3, Total: 100 ng/dL (ref 71–180)

## 2018-02-17 LAB — T4, FREE: FREE T4: 1.41 ng/dL (ref 0.82–1.77)

## 2018-02-17 LAB — FOLATE: FOLATE: 15.5 ng/mL (ref >3.0)

## 2018-02-20 NOTE — Progress Notes (Signed)
Office: [phone]  /  Fax: [phone]   Dear Dr. Gwendolyn Lewis,   Thank you for referring Leonard Lewis to our clinic. The following note includes my evaluation and treatment recommendations.  HPI:   Chief Complaint: OBESITY    Leonard Lewis has been referred by Leonard Lewis. Leonard Grant, MD for consultation regarding his obesity and obesity related comorbidities.    Leonard Lewis (MR# [id number]) is a 37 y.o. male who presents on 02/16/2018 for obesity evaluation and treatment. Current BMI is Body mass index is 53.98 kg/m.Leonard Lewis has been struggling with his weight for many years and has been unsuccessful in either losing weight, maintaining weight loss, or reaching his healthy weight goal.     Leonard Lewis is status post gastric sleeve in 2016. He has gone from 420 lbs to 360 lbs in 8 months, started regaining after 1 year, now 387 lbs. He notes minimal restriction from his surgery.      Leonard Lewis attended our information session and states he is currently in the action stage of change and ready to dedicate time achieving and maintaining a healthier weight. Leonard Lewis is interested in becoming our patient and working on intensive lifestyle modifications including (but not limited to) diet, exercise and weight loss.    Leonard Lewis states his family eats meals together he thinks his family will eat healthier with  him his desired weight loss is 137 lbs he has been heavy most of  his life he started gaining weight in his 20's his heaviest weight ever was 470 lbs he is frequently drinking liquids with calories he frequently makes poor food choices he frequently eats larger portions than normal  he struggles with emotional eating    Fatigue Leonard Lewis feels his energy is lower than it should be. This has worsened with weight gain and has not worsened recently. Leonard Lewis admits to daytime somnolence and  admits to waking up still tired. Patient is at risk for obstructive sleep apnea. Patent has a history of symptoms of daytime  fatigue and morning headache. Patient generally gets 5 hours of sleep per night, and states they generally have generally restful sleep. Snoring is present. Apneic episodes are present. Epworth Sleepiness Score is 8.  Dyspnea on exertion [place] notes increasing shortness of breath with exercising and seems to be worsening over time with weight gain. He notes getting out of breath sooner with activity than he used to. This has not gotten worse recently. Leonard Lewis denies orthopnea.  Hypertension Leonard Lewis is a 37 y.o. male with hypertension. Leonard Lewis's blood pressure is stable on medications. He denies chest pain or headache. He is working weight loss to help control his blood pressure with the goal of decreasing his risk of heart attack and stroke. Leonard Lewis blood pressure is currently controlled.  Pre-Diabetes Leonard Lewis has a diagnosis of pre-diabetes based on his elevated Hgb A1c and was informed this puts him at greater risk of developing diabetes. He is attempting to diet control, recent finger stick A1c was 5.6. He note polyphagia and denies nausea or hypoglycemia.  At risk for diabetes Leonard Lewis is at higher than average risk for developing diabetes due to his obesity and pre-diabetes. He currently denies polyuria or polydipsia.  Positive Depression Screen Leonard Lewis has a positive depression screening with PHQ-9 of 21 and he feels sometimes life isn't worth living due to his weight problems. He denies suicidal ideas or homicidal ideas. He started seeing a Psychologist who is working with Systems analyst.  Leonard Lewis Food and Mood (  modified PHQ-9) score was  Depression screen PHQ 2/9 02/16/2018  Decreased Interest 3  Down, Depressed, Hopeless 3  PHQ - 2 Score 6  Altered sleeping 2  Tired, decreased energy 2  Change in appetite 2  Feeling bad or failure about yourself  3  Trouble concentrating 3  Moving slowly or fidgety/restless 2  Suicidal thoughts 1  PHQ-9 Score 21  Difficult doing work/chores Very  difficult    ALLERGIES: No Known Allergies  MEDICATIONS: Current Outpatient Medications on File Prior to Visit  Medication Sig Dispense Refill  . amLODipine (NORVASC) 10 MG tablet Take 1 tablet (10 mg total) by mouth daily. Needs appt before any more refills or meds given 30 tablet 6  . aspirin EC 81 MG tablet Take 81 mg by mouth daily.    . carvedilol (COREG) 25 MG tablet Take 1 tablet (25 mg total) by mouth 2 (two) times daily. 60 tablet 3  . lisinopril (PRINIVIL,ZESTRIL) 40 MG tablet Take 1 tablet (40 mg total) by mouth daily. 30 tablet 6  . spironolactone (ALDACTONE) 50 MG tablet Take 50 mg by mouth daily.     No current facility-administered medications on file prior to visit.     PAST MEDICAL HISTORY: Past Medical History:  Diagnosis Date  . Arthritis   . Asthma   . Back pain   . CHF (congestive heart failure) (HCC) 2008   EF 40-50%        . Fatty liver   . Hypertension   . Leg edema   . Obesity, morbid (HCC)   . Obstructive sleep apnea   . Prediabetes     PAST SURGICAL HISTORY: Past Surgical History:  Procedure Laterality Date  . CARDIAC CATHETERIZATION  Oct 2013   cardiac cath negative for obstructive disease -- did show end diastolic pressure secondary to systemic hypertension  . LAPAROSCOPIC GASTRIC SLEEVE RESECTION N/A 01/27/2015   Procedure: LAPAROSCOPIC GASTRIC SLEEVE RESECTION WITH UPPER ENDOSCOPY;  Surgeon: Luretha Murphy, MD;  Location: [facility];  Service: General;  Laterality: N/A;  . LEFT HEART CATHETERIZATION WITH CORONARY ANGIOGRAM N/A 08/03/2012   Procedure: LEFT HEART CATHETERIZATION WITH CORONARY ANGIOGRAM;  Surgeon: Tonny Bollman, MD;  Location: [facility] C[facility] LAB;  Service: Cardiovascular;  Laterality: N/A;  . NO PAST SURGERIES      SOCIAL HISTORY: Social History   Tobacco Use  . Smoking status: Former Smoker    Types: Cigarettes    Last attempt to quit: 10/04/2006    Years since quitting: 11.3  . Smokeless tobacco: Never Used  Substance Use Topics   . Alcohol use: No  . Drug use: No    FAMILY HISTORY: Family History  Problem Relation Age of Onset  . Hypertension Mother   . Obesity Mother   . Hypertension Father     ROS: Review of Systems  Constitutional: Positive for malaise/fatigue. Negative for weight loss.       + Trouble sleeping  Eyes:       + Wear glasses or contacts  Respiratory: Positive for shortness of breath.   Cardiovascular: Negative for chest pain and orthopnea.  Gastrointestinal: Negative for nausea.  Genitourinary: Negative for frequency.  Neurological: Negative for headaches.  Endo/Heme/Allergies: Negative for polydipsia.       Positive polyphagia Negative hypoglycemia  Psychiatric/Behavioral: Positive for depression. Negative for suicidal ideas.       + Stress    PHYSICAL EXAM: Blood pressure 128/83, pulse 72, temperature 98.1 F (36.7 C), temperature source Oral, height  (  1.803 m), weight (!) 387 lb (175.5 kg), SpO2 98 %. Body mass index is 53.98 kg/m. Physical Exam  Constitutional: He is oriented to person, place, and time. He appears well-developed and well-nourished.  HENT:  Head: Normocephalic and atraumatic.  Nose: Nose normal.  Eyes: EOM are normal. No scleral icterus.  Neck: Normal range of motion. Neck supple. No thyromegaly present.  Cardiovascular: Normal rate and regular rhythm.  Pulmonary/Chest: Effort normal. No respiratory distress.  Abdominal: Soft. There is no tenderness.  + Obesity  Musculoskeletal:  Range of Motion normal in all 4 extremities Trace edema noted in bilateral lower extremities  Neurological: He is alert and oriented to person, place, and time. Coordination normal.  Skin: Skin is warm and dry.  Psychiatric: He has a normal mood and affect.  Vitals reviewed.   RECENT LABS AND TESTS: BMET    Component Value Date/Time   NA 138 02/16/2018 0837   K 4.4 02/16/2018 0837   CL 101 02/16/2018 0837   CO2 24 02/16/2018 0837   GLUCOSE 89 02/16/2018 0837     GLUCOSE 80 05/25/2016 1656   BUN 15 02/16/2018 0837   CREATININE 1.39 (H) 02/16/2018 0837   CREATININE 1.40 (H) 05/25/2016 1656   CALCIUM 9.8 02/16/2018 0837   GFRNONAA 64 02/16/2018 0837   GFRAA 74 02/16/2018 0837   Lab Results  Component Value Date   HGBA1C 5.7 (H) 02/16/2018   Lab Results  Component Value Date   INSULIN 21.9 02/16/2018   CBC    Component Value Date/Time   WBC 5.9 02/16/2018 0837   WBC 10.6 (H) 01/29/2015 0515   RBC 5.93 (H) 02/16/2018 0837   RBC 5.54 01/29/2015 0515   HGB 13.5 02/16/2018 0837   HCT 42.1 02/16/2018 0837   PLT 288 06/10/2017 0939   MCV 71 (L) 02/16/2018 0837   MCH 22.8 (L) 02/16/2018 0837   MCH 21.7 (L) 01/29/2015 0515   MCHC 32.1 02/16/2018 0837   MCHC 31.2 01/29/2015 0515   RDW 16.5 (H) 02/16/2018 0837   LYMPHSABS 2.2 02/16/2018 0837   MONOABS 1.0 01/29/2015 0515   EOSABS 0.2 02/16/2018 0837   BASOSABS 0.0 02/16/2018 0837   Iron/TIBC/Ferritin/ %Sat No results found for: IRON, TIBC, FERRITIN, IRONPCTSAT Lipid Panel     Component Value Date/Time   CHOL 179 02/16/2018 0837   TRIG 123 02/16/2018 0837   HDL 42 02/16/2018 0837   CHOLHDL 4.9 06/10/2017 0939   CHOLHDL 5.5 Ratio 11/25/2008 2123   VLDL 33 11/25/2008 2123   LDLCALC 112 (H) 02/16/2018 0837   LDLDIRECT 126 (H) 02/05/2013 1103   Hepatic Function Panel     Component Value Date/Time   PROT 7.3 02/16/2018 0837   ALBUMIN 4.1 02/16/2018 0837   AST 22 02/16/2018 0837   ALT 24 02/16/2018 0837   ALKPHOS 52 02/16/2018 0837   BILITOT 0.3 02/16/2018 0837      Component Value Date/Time   TSH 1.600 02/16/2018 0837   TSH 1.691 12/06/2013 1118   TSH 1.310 11/16/2010 1518    ECG  shows NSR with a rate of 75 BPM INDIRECT CALORIMETER done today shows a VO2 of 316 and a REE of 2203.  His calculated basal metabolic rate is 1610 thus his basal metabolic rate is worse than expected.    ASSESSMENT AND PLAN: Other fatigue - Plan: EKG 12-Lead, Vitamin B12, CBC With  Differential, Folate, Lipid Panel With LDL/HDL Ratio, T3, T4, free, TSH, VITAMIN D 25 Hydroxy (Vit-D Deficiency, Fractures)  Shortness of  breath on exertion - Plan: CBC With Differential  Essential hypertension  Prediabetes - Plan: Comprehensive metabolic panel, Hemoglobin A1c, Insulin, random  Positive depression screening  S/P gastric bypass  At risk for diabetes mellitus  Class 3 severe obesity with serious comorbidity and body mass index (BMI) of 50.0 to 59.9 in adult, unspecified obesity type (HCC)  PLAN:  Fatigue Bensyn was informed that his fatigue may be related to obesity, depression or many other causes. Labs will be ordered, and in the meanwhile Shun has agreed to work on diet, exercise and weight loss to help with fatigue. Proper sleep hygiene was discussed including the need for 7-8 hours of quality sleep each night. A sleep study was not ordered based on symptoms and Epworth score.  Dyspnea on exertion Race's shortness of breath appears to be obesity related and exercise induced. He has agreed to work on weight loss and gradually increase exercise to treat his exercise induced shortness of breath. If Seabron follows our instructions and loses weight without improvement of his shortness of breath, we will plan to refer to pulmonology. We will monitor this condition regularly. Cammeron agrees to this plan.  Hypertension We discussed sodium restriction, working on healthy weight loss, and a regular exercise program as the means to achieve improved blood pressure control. Clayton agreed with this plan and agreed to follow up as directed. We will continue to monitor his blood pressure as well as his progress with the above lifestyle modifications. He will continue his medications and start diet and exercise. He will watch for signs of hypotension as he continues his lifestyle modifications. We will check labs and Raynor agrees to follow up with our clinic in 2 weeks.  Pre-Diabetes Yahia will  continue to work on weight loss, exercise, and decreasing simple carbohydrates in his diet to help decrease the risk of diabetes. We dicussed metformin including benefits and risks. He was informed that eating too many simple carbohydrates or too many calories at one sitting increases the likelihood of GI side effects. Kyce declined metformin for now and a prescription was not written today. We will check labs and Rion is to start diet prescription. Haris agrees to follow up with our clinic in 2 weeks as directed to monitor his progress.  Diabetes risk counselling Delmos was given extended (15 minutes) diabetes prevention counseling today. He is 37 y.o. male and has risk factors for diabetes including obesity and pre-diabetes. We discussed intensive lifestyle modifications today with an emphasis on weight loss as well as increasing exercise and decreasing simple carbohydrates in his diet.  Positive Depression Screen Yakub had a positive depression screening. We discussed cognitive behavioral therapy to help with mood and emotional eating. We will monitor this closely. Depression is commonly associated with obesity and often results in emotional eating behaviors. Referral to Psychology may be required if no improvement is seen as he continues in our clinic.  Obesity Johndavid is currently in the action stage of change and his goal is to continue with weight loss efforts. I recommend Jobany begin the structured treatment plan as follows:  He has agreed to follow the Category 4 plan Uzziel has been instructed to eventually work up to a goal of 150 minutes of combined cardio and strengthening exercise per week for weight loss and overall health benefits. We discussed the following Behavioral Modification Strategies today: increasing lean protein intake, decreasing simple carbohydrates  and work on meal planning and easy cooking plans   He was informed of  the importance of frequent follow up visits to maximize his  success with intensive lifestyle modifications for his multiple health conditions. He was informed we would discuss his lab results at his next visit unless there is a critical issue that needs to be addressed sooner. Marcus agreed to keep his next visit at the agreed upon time to discuss these results.    OBESITY BEHAVIORAL INTERVENTION VISIT  Today's visit was # 1 out of 22.  Starting weight: 387 lbs Starting date: 02/16/18 Today's weight : 387 lbs Today's date: 02/16/2018 Total lbs lost to date: 0 (Patients must lose 7 lbs in the first 6 months to continue with counseling)   ASK: We discussed the diagnosis of obesity with Donnetta Simpers today and Leonard Lewis agreed to give [place] permission to discuss obesity behavioral modification therapy today.  ASSESS: Cobi has the diagnosis of obesity and his BMI today is 13 Winson is in the action stage of change   ADVISE: Slayde was educated on the multiple health risks of obesity as well as the benefit of weight loss to improve his health. He was advised of the need for long term treatment and the importance of lifestyle modifications.  AGREE: Multiple dietary modification options and treatment options were discussed and  Cadin agreed to the above obesity treatment plan.   I, Burt Knack, am acting as transcriptionist for Quillian Quince, MD   I have reviewed the above documentation for accuracy and completeness, and I agree with the above. -Quillian Quince, MD

## 2018-03-02 ENCOUNTER — Ambulatory Visit (INDEPENDENT_AMBULATORY_CARE_PROVIDER_SITE_OTHER): Payer: BLUE CROSS/BLUE SHIELD | Admitting: Family Medicine

## 2018-03-02 ENCOUNTER — Encounter (INDEPENDENT_AMBULATORY_CARE_PROVIDER_SITE_OTHER): Payer: Self-pay

## 2018-03-10 DIAGNOSIS — F509 Eating disorder, unspecified: Secondary | ICD-10-CM | POA: Diagnosis not present

## 2018-04-21 ENCOUNTER — Telehealth: Payer: Self-pay | Admitting: Family Medicine

## 2018-04-21 NOTE — Telephone Encounter (Signed)
LVM to schedule f/u. Please assist in doing this

## 2018-06-26 ENCOUNTER — Other Ambulatory Visit: Payer: Self-pay | Admitting: Family Medicine

## 2018-06-28 ENCOUNTER — Other Ambulatory Visit: Payer: Self-pay | Admitting: Family Medicine

## 2018-07-18 ENCOUNTER — Other Ambulatory Visit: Payer: Self-pay | Admitting: Family Medicine

## 2018-07-20 ENCOUNTER — Other Ambulatory Visit: Payer: Self-pay | Admitting: Family Medicine

## 2018-08-21 ENCOUNTER — Ambulatory Visit: Payer: BLUE CROSS/BLUE SHIELD

## 2018-10-11 ENCOUNTER — Ambulatory Visit: Payer: BLUE CROSS/BLUE SHIELD | Admitting: Family Medicine

## 2018-10-11 ENCOUNTER — Other Ambulatory Visit: Payer: Self-pay

## 2018-10-11 ENCOUNTER — Ambulatory Visit (INDEPENDENT_AMBULATORY_CARE_PROVIDER_SITE_OTHER): Payer: BLUE CROSS/BLUE SHIELD | Admitting: Family Medicine

## 2018-10-11 ENCOUNTER — Encounter: Payer: Self-pay | Admitting: Family Medicine

## 2018-10-11 VITALS — BP 166/130 | HR 72 | Temp 97.4°F | Ht 71.0 in | Wt >= 6400 oz

## 2018-10-11 DIAGNOSIS — Z0000 Encounter for general adult medical examination without abnormal findings: Secondary | ICD-10-CM

## 2018-10-11 DIAGNOSIS — I10 Essential (primary) hypertension: Secondary | ICD-10-CM

## 2018-10-11 MED ORDER — LISINOPRIL 40 MG PO TABS: 40.0000 mg | ORAL_TABLET | Freq: Every day | ORAL | 1 refills | Status: DC

## 2018-10-11 MED ORDER — AMLODIPINE BESYLATE 10 MG PO TABS: 10.0000 mg | ORAL_TABLET | Freq: Every day | ORAL | 1 refills | Status: DC

## 2018-10-11 MED ORDER — CARVEDILOL 25 MG PO TABS: 25.0000 mg | ORAL_TABLET | Freq: Two times a day (BID) | ORAL | 3 refills | Status: DC

## 2018-10-11 NOTE — Patient Instructions (Addendum)
It was good to see you again today.  I have refilled your blood pressure medicines.    Come back in the next 4 - 6 weeks to make sure your blood pressure is better and we can talk about weight loss medications.  Have a good New Year!

## 2018-10-11 NOTE — Progress Notes (Signed)
Leonard Lewis is a 38 y.o. male who presents to [facility] today for a comprehensive physical examination:  CPE  Concerns:  Would like to lose weight.  Asking about weight loss medications.  Also needs BP meds refilled.  Has been out of these for at least several several weeks, if not months.  He is unsure exactly.    Last physical 2 years ago Tetanus up to date Flu declined Eye exam:  n/a Dental exam every six months.   PMH reviewed. Patient is a nonsmoker.   Past Medical History:  Diagnosis Date  . Arthritis   . Asthma   . Back pain   . CHF (congestive heart failure) (HCC) 2008   EF 40-50%        . Fatty liver   . Hypertension   . Leg edema   . Obesity, morbid (HCC)   . Obstructive sleep apnea   . Prediabetes    Past Surgical History:  Procedure Laterality Date  . CARDIAC CATHETERIZATION  Oct 2013   cardiac cath negative for obstructive disease -- did show end diastolic pressure secondary to systemic hypertension  . LAPAROSCOPIC GASTRIC SLEEVE RESECTION N/A 01/27/2015   Procedure: LAPAROSCOPIC GASTRIC SLEEVE RESECTION WITH UPPER ENDOSCOPY;  Surgeon: Luretha Murphy, MD;  Location: [facility]S;  Service: General;  Laterality: N/A;  . LEFT HEART CATHETERIZATION WITH CORONARY ANGIOGRAM N/A 08/03/2012   Procedure: LEFT HEART CATHETERIZATION WITH CORONARY ANGIOGRAM;  Surgeon: Tonny Bollman, MD;  Location: [facility] CATH LAB;  Service: Cardiovascular;  Laterality: N/A;  . NO PAST SURGERIES      Medications reviewed. Current Outpatient Medications  Medication Sig Dispense Refill  . amLODipine (NORVASC) 10 MG tablet Take 1 tablet (10 mg total) by mouth daily. Needs appt before any more refills or meds given 90 tablet 1  . aspirin EC 81 MG tablet Take 81 mg by mouth daily.    . carvedilol (COREG) 25 MG tablet Take 1 tablet (25 mg total) by mouth 2 (two) times daily. 60 tablet 3  . lisinopril (PRINIVIL,ZESTRIL) 40 MG tablet Take 1 tablet (40 mg total) by mouth daily. 90 tablet 1    No current facility-administered medications for this visit.     Social: Smoking history:  denies Alcohol use:  intermittent Illicit drug use:  denies Relationship status:   married Occupation: Going to school and working at [employer]  Family History:  No family history of early cardiac death   Review of Systems  Constitutional: Negative for fever.  HENT: Negative for congestion, ear discharge, ear pain and hearing loss.   Eyes: Negative for blurred vision.  Respiratory: Negative for cough and wheezing.   Cardiovascular: Negative for chest pain, palpitations and leg swelling.  Gastrointestinal: Negative for nausea, vomiting and abdominal pain.  Genitourinary: Negative for dysuria, hematuria and flank pain.  Musculoskeletal: Negative for neck pain.  Skin: Negative for rash.  Neurological: Negative for dizziness and headaches.  Psychiatric/Behavioral: Negative for depression and suicidal ideas.   Exam: BP (!) 166/130   Pulse 72   Temp (!) 97.4 F (36.3 C) (Oral)   Ht 5\' 11"  (1.803 m)   Wt (!) 410 lb (186 kg)   SpO2 97%   BMI 57.18 kg/m  Gen:  Alert, cooperative patient who appears stated age in no acute distress.  Vital signs reviewed.  Morbidly obese Head: [place]/AT.   Eyes:  EOMI, PERRL.   Ears:  External ears WNL, Bilateral TM's normal without retraction, redness or bulging. Nose:  Septum midline  Mouth:  MMM, tonsils non-erythematous, non-edematous.   Neck: No masses or thyromegaly or limitation in range of motion.  No cervical lymphadenopathy. Pulm:  Clear to auscultation bilaterally with good air movement.  No wheezes or rales noted.   Cardiac:  Regular rate and rhythm without murmur auscultated.  Good S1/S2. Abd:  Soft/nondistended/nontender. Obese  Ext:  No clubbing/cyanosis/erythema.  No edema noted bilateral lower extremities.   Neuro:  Grossly normal, no gait abnormalities Psych:  Not depressed or anxious appearing.  Conversant and  engaged  Impression/Plan: 1. Complete Physical Examination: anticipatory guidance provided.  2.  Morbid obesity:  Discussed.  He has attempted medical weight loss in past but was unsuccessful.  Would like to discuss potential medications next visit.  Need to get BP under control first.  3.  HTN:  Elevated.  Not adherent to medications.   4.  Screening cholesterol: obtain FLP.

## 2018-10-12 LAB — CBC WITH DIFFERENTIAL/PLATELET
BASOS ABS: 0.1 10*3/uL (ref 0.0–0.2)
Basos: 1 %
EOS (ABSOLUTE): 0.4 10*3/uL (ref 0.0–0.4)
Eos: 6 %
Hematocrit: 41.5 % (ref 37.5–51.0)
Hemoglobin: 13.1 g/dL (ref 13.0–17.7)
Immature Grans (Abs): 0 10*3/uL (ref 0.0–0.1)
Immature Granulocytes: 0 %
Lymphocytes Absolute: 3.4 10*3/uL — ABNORMAL HIGH (ref 0.7–3.1)
Lymphs: 43 %
MCH: 22.3 pg — ABNORMAL LOW (ref 26.6–33.0)
MCHC: 31.6 g/dL (ref 31.5–35.7)
MCV: 71 fL — ABNORMAL LOW (ref 79–97)
Monocytes Absolute: 0.7 10*3/uL (ref 0.1–0.9)
Monocytes: 9 %
Neutrophils Absolute: 3.3 10*3/uL (ref 1.4–7.0)
Neutrophils: 41 %
Platelets: 345 10*3/uL (ref 150–450)
RBC: 5.88 x10E6/uL — ABNORMAL HIGH (ref 4.14–5.80)
RDW: 15.9 % — AB (ref 11.6–15.4)
WBC: 7.9 10*3/uL (ref 3.4–10.8)

## 2018-10-12 LAB — LIPID PANEL
Chol/HDL Ratio: 5.7 ratio — ABNORMAL HIGH (ref 0.0–5.0)
Cholesterol, Total: 228 mg/dL — ABNORMAL HIGH (ref 100–199)
HDL: 40 mg/dL (ref >39)
LDL Calculated: 157 mg/dL — ABNORMAL HIGH (ref 0–99)
Triglycerides: 156 mg/dL — ABNORMAL HIGH (ref 0–149)
VLDL Cholesterol Cal: 31 mg/dL (ref 5–40)

## 2018-10-12 LAB — BASIC METABOLIC PANEL
BUN/Creatinine Ratio: 11 (ref 9–20)
BUN: 17 mg/dL (ref 6–20)
CHLORIDE: 103 mmol/L (ref 96–106)
CO2: 25 mmol/L (ref 20–29)
Calcium: 9.5 mg/dL (ref 8.7–10.2)
Creatinine, Ser: 1.5 mg/dL — ABNORMAL HIGH (ref 0.76–1.27)
GFR calc Af Amer: 67 mL/min/{1.73_m2} (ref >59)
GFR calc non Af Amer: 58 mL/min/{1.73_m2} — ABNORMAL LOW (ref >59)
Glucose: 89 mg/dL (ref 65–99)
Potassium: 4.5 mmol/L (ref 3.5–5.2)
Sodium: 140 mmol/L (ref 134–144)

## 2018-10-13 ENCOUNTER — Encounter: Payer: Self-pay | Admitting: Family Medicine

## 2018-11-14 ENCOUNTER — Ambulatory Visit: Payer: BLUE CROSS/BLUE SHIELD | Admitting: Family Medicine

## 2018-12-05 ENCOUNTER — Ambulatory Visit: Payer: BLUE CROSS/BLUE SHIELD | Admitting: Family Medicine

## 2019-04-03 ENCOUNTER — Other Ambulatory Visit: Payer: Self-pay

## 2019-04-03 ENCOUNTER — Ambulatory Visit: Payer: BLUE CROSS/BLUE SHIELD | Admitting: Family Medicine

## 2019-04-03 MED ORDER — LISINOPRIL 40 MG PO TABS: 40.0000 mg | ORAL_TABLET | Freq: Every day | ORAL | 0 refills | Status: DC

## 2019-04-03 MED ORDER — AMLODIPINE BESYLATE 10 MG PO TABS: 10.0000 mg | ORAL_TABLET | Freq: Every day | ORAL | 0 refills | Status: DC

## 2019-05-03 DIAGNOSIS — Z20828 Contact with and (suspected) exposure to other viral communicable diseases: Secondary | ICD-10-CM | POA: Diagnosis not present

## 2019-05-04 ENCOUNTER — Other Ambulatory Visit: Payer: Self-pay

## 2019-05-04 ENCOUNTER — Telehealth (INDEPENDENT_AMBULATORY_CARE_PROVIDER_SITE_OTHER): Payer: BC Managed Care – PPO | Admitting: Family Medicine

## 2019-05-04 DIAGNOSIS — R0981 Nasal congestion: Secondary | ICD-10-CM | POA: Diagnosis not present

## 2019-05-04 DIAGNOSIS — J029 Acute pharyngitis, unspecified: Secondary | ICD-10-CM

## 2019-05-04 DIAGNOSIS — Z20822 Contact with and (suspected) exposure to covid-19: Secondary | ICD-10-CM

## 2019-05-04 DIAGNOSIS — R05 Cough: Secondary | ICD-10-CM

## 2019-05-04 DIAGNOSIS — Z20828 Contact with and (suspected) exposure to other viral communicable diseases: Secondary | ICD-10-CM | POA: Diagnosis not present

## 2019-05-04 NOTE — Progress Notes (Signed)
[facility] Visit  Patient consented to have virtual visit. Method of visit: Telephone  Encounter participants: Patient: Leonard Lewis - located at home Provider: Bonnita Hollow - located at office Others (if applicable):   Chief Complaint: nasal congestion  HPI:  Leonard Lewis is a 38 y.o. male who presents with with 3 days of nasal congestion.  It is associated with sore throat and cough. The symptoms are not getting worse. Patient went [facility] and received coronavirus test yesterday.  Results are still pending.  He endorses highest temperature up to 99.8 Fahrenheit.  He has been using Alka-Seltzer plus, Claritin-D as needed for symptoms.  He says he cannot return to work till he has negative coronavirus test. Patient is self quarantining at home.  ROS: Denies shortness of breath  Pertinent PMHx: Hypertension  Exam:  Respiratory: Sounds congested, able to speak in full sentences without dyspnea  Assessment/Plan: Viral URI, possible COVID-19 Patient with viral URI type symptoms.  He has coronavirus test pending.  He is self quarantine and using OTC measures.  Informed patient that he is currently using recommended treatments.  Return precautions given.  Recommended patient self quarantine until as a negative test or symptom-free.  Time spent during visit with patient: 5 minutes

## 2019-06-11 ENCOUNTER — Emergency Department (HOSPITAL_BASED_OUTPATIENT_CLINIC_OR_DEPARTMENT_OTHER)
Admission: EM | Admit: 2019-06-11 | Discharge: 2019-06-11 | Disposition: A | Discharge status: Home / Self Care | Payer: BC Managed Care – PPO | Attending: Emergency Medicine | Admitting: Emergency Medicine

## 2019-06-11 ENCOUNTER — Other Ambulatory Visit: Payer: Self-pay

## 2019-06-11 ENCOUNTER — Emergency Department (HOSPITAL_BASED_OUTPATIENT_CLINIC_OR_DEPARTMENT_OTHER): Payer: BC Managed Care – PPO

## 2019-06-11 ENCOUNTER — Encounter (HOSPITAL_BASED_OUTPATIENT_CLINIC_OR_DEPARTMENT_OTHER): Payer: Self-pay | Admitting: *Deleted

## 2019-06-11 DIAGNOSIS — I509 Heart failure, unspecified: Secondary | ICD-10-CM | POA: Insufficient documentation

## 2019-06-11 DIAGNOSIS — I10 Essential (primary) hypertension: Secondary | ICD-10-CM | POA: Diagnosis not present

## 2019-06-11 DIAGNOSIS — M25572 Pain in left ankle and joints of left foot: Secondary | ICD-10-CM | POA: Diagnosis not present

## 2019-06-11 DIAGNOSIS — I110 Hypertensive heart disease with heart failure: Secondary | ICD-10-CM | POA: Insufficient documentation

## 2019-06-11 DIAGNOSIS — Z79899 Other long term (current) drug therapy: Secondary | ICD-10-CM | POA: Diagnosis not present

## 2019-06-11 NOTE — ED Triage Notes (Signed)
Left ankle pain x 1 week since going up and down stairs multiple times. Denies known injury. Pt with cam walker in place from previous injury.

## 2019-06-11 NOTE — ED Provider Notes (Signed)
[facility] EMERGENCY DEPARTMENT Provider Note   CSN: [id number] Arrival date & time: 06/11/19  1613     History   Chief Complaint Chief Complaint  Patient presents with  . Ankle Pain    HPI Leonard Lewis is a 38 y.o. male w past medical history of arthritis, hypertension, morbid obesity, presenting to the emergency department with complaint of gradual onset of left ankle pain that began about 1 week ago.  He localizes pain to the anterior medial aspect of the ankle that is worse with prolonged walking.  He states he helped his mother up and down the stairs a week ago prior to his symptoms beginning.  He did not had any particular injury.  He states he has a cam walker boot at home from a prior tendinitis to the right ankle which he has been wearing on the left with some improvement in symptoms.     The history is provided by the patient.    Past Medical History:  Diagnosis Date  . Arthritis   . Asthma   . Back pain   . CHF (congestive heart failure) (HCC) 2008   EF 40-50%        . Fatty liver   . Hypertension   . Leg edema   . Obesity, morbid (HCC)   . Obstructive sleep apnea   . Prediabetes     Patient Active Problem List   Diagnosis Date Noted  . Other fatigue 02/16/2018  . Shortness of breath on exertion 02/16/2018  . Essential hypertension 02/16/2018  . Prediabetes 01/03/2018  . Viral upper respiratory illness 06/10/2017  . 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 Chronic systolic heart failure (HCC) 10/30/2013  . Obstructive sleep apnea 05/31/2007  . HYPERTENSION, BENIGN ESSENTIAL 05/02/2007  . HLD (hyperlipidemia) 03/17/2007  . Morbid obesity (HCC) 03/17/2007    Past Surgical History:  Procedure Laterality Date  . CARDIAC CATHETERIZATION  Oct 2013   cardiac cath negative for obstructive disease -- did show end diastolic pressure secondary to systemic hypertension  . LAPAROSCOPIC GASTRIC SLEEVE RESECTION N/A 01/27/2015   Procedure: LAPAROSCOPIC GASTRIC SLEEVE RESECTION WITH UPPER ENDOSCOPY;  Surgeon: Luretha MurphyMatthew Martin, MD;  Location: [facility];  Service: General;  Laterality: N/A;  . LEFT HEART CATHETERIZATION WITH CORONARY ANGIOGRAM N/A 08/03/2012   Procedure: LEFT HEART CATHETERIZATION WITH CORONARY ANGIOGRAM;  Surgeon: Tonny BollmanMichael Cooper, MD;  [facility];  Service: Cardiovascular;  Laterality: N/A;  . NO PAST SURGERIES          Home Medications    Prior to Admission medications   Medication Sig Start Date End Date Taking? Authorizing Provider  amLODipine (NORVASC) 10 MG tablet Take 1 tablet (10 mg total) by mouth daily. Needs appt before any more refills or meds given 04/03/19   Janit PaganEniola, Kehinde T, MD  aspirin EC 81 MG tablet Take 81 mg by mouth daily.    [provider]  carvedilol (COREG) 25 MG tablet Take 1 tablet (25 mg total) by mouth 2 (two) times daily. 10/11/18   Tobey GrimWalden, Jeffrey H, MD  lisinopril (ZESTRIL) 40 MG tablet Take 1 tablet (40 mg total) by mouth daily. 04/03/19   Doreene ElandEniola, Kehinde T, MD    Family History Family History  Problem Relation Age of Onset  . Hypertension Mother   . Obesity Mother   . Hypertension Father     Social History Social History   Tobacco Use  . Smoking status: Former Smoker    Types:  Cigarettes    Quit date: 10/04/2006    Years since quitting: 12.6  . Smokeless tobacco: Never Used  Substance Use Topics  . Alcohol use: No  . Drug use: No     Allergies   Patient has no known allergies.   Review of Systems Review of Systems  Musculoskeletal: Positive for arthralgias.  Skin: Negative for wound.     Physical Exam Updated Vital Signs BP (!) 153/104 (BP Location: Left Arm)   Pulse 98   Temp 98.5 F (36.9 C) (Oral)   Resp 18   Ht 6' (1.829 m)   Wt (!) 186 kg   SpO2 95%   BMI 55.61 kg/m   Physical Exam Vitals signs and nursing note reviewed.  Constitutional:      General: He is not in acute distress.    Appearance: He is  well-developed.     Comments: Morbidly obese  HENT:     Head: Normocephalic and atraumatic.  Eyes:     Conjunctiva/sclera: Conjunctivae normal.  Cardiovascular:     Rate and Rhythm: Normal rate.  Pulmonary:     Effort: Pulmonary effort is normal.  Musculoskeletal:     Comments: There is some mild tenderness to the anterior aspect and medial aspect of the left ankle.  Pain with range of motion.  Ankle is stable without deformity or large swelling.  Intact DP pulse.  Normal distal sensation.  No redness or warmth or tenderness of the calf.  Neurological:     Mental Status: He is alert.  Psychiatric:        Mood and Affect: Mood normal.        Behavior: Behavior normal.      ED Treatments / Results  Labs (all labs ordered are listed, but only abnormal results are displayed) Labs Reviewed - No data to display  EKG None  Radiology Dg Ankle Complete Left  Result Date: 06/11/2019 CLINICAL DATA:  Left ankle pain for 1 week EXAM: LEFT ANKLE COMPLETE - 3+ VIEW COMPARISON:  07/18/2017 FINDINGS: There is no evidence of acute fracture, dislocation, or joint effusion. Pes planus alignment. Mild degenerative changes of the anterior tibiotalar joint as well as at the talonavicular joint. Small bidirectional calcaneal enthesophytes. Mild circumferential soft tissue prominence. IMPRESSION: 1. No acute osseous abnormality, left ankle. 2. Diffuse soft tissue prominence. Electronically Signed   By: Duanne Guess M.D.   On: 06/11/2019 16:57    Procedures Procedures (including critical care time)  Medications Ordered in ED Medications - No data to display   Initial Impression / Assessment and Plan / ED Course  I have reviewed the triage vital signs and the nursing notes.  Pertinent labs & imaging results that were available during my care of the patient were reviewed by me and considered in my medical decision making (see chart for details).        Patient presenting with left ankle  pain after helping his mother with multiple trips on a stairwell last week.  His symptoms of improved with using his cam walker boot at home.  No particular fall.  X-ray is negative for acute injuries though does show some degenerative changes.  Recommend patient continue wearing the cam walker boot if this provides some support and relief.  Will provide Ortho follow-up as needed if symptoms do not improve.  Over-the-counter medications, R ICE therapy indicated.  Patient agreeable to plan and safe for discharge.  Discussed results, findings, treatment and follow up. Patient advised of return  precautions. Patient verbalized understanding and agreed with plan.  Final Clinical Impressions(s) / ED Diagnoses   Final diagnoses:  Acute left ankle pain    ED Discharge Orders    None       Taseen Marasigan, [place] N, PA-C 06/11/19 1927    Malvin Johns, MD 06/11/19 [phone]

## 2019-06-11 NOTE — Discharge Instructions (Signed)
Please read instructions below. Apply ice to your ankle for 20 minutes at a time. Elevate it when possible.  You can take tylenol every 4-6 hours as needed for pain. Schedule an appointment with the orthopedic specialist in 1 week if symptoms do not improve. Return to the ER for new or concerning symptoms.

## 2019-06-11 NOTE — ED Triage Notes (Signed)
Pt with cam walker, ankle brace and coban wrapped around ankle and foot.  Removed all.

## 2019-06-18 ENCOUNTER — Telehealth: Payer: Self-pay

## 2019-06-18 NOTE — Telephone Encounter (Signed)
Called patient to do their pre-visit COVID screening.  Have you tested positive for COVID or are you currently waiting for COVID test results? no  Have you recently traveled internationally([place], [place], [place], [place], [place]) or within the [place] to a hotspot area([place], [place], [place], [place], [place])? no  Are you currently experiencing any of the following symptoms: fever, cough, SHOB, fatigue, body aches, loss of smell/taste, rash, diarrhea, vomiting, severe headaches, weakness, sore throat? no  Have you been in contact with anyone who has recently travelled? no  Have you been in contact with anyone who is experiencing any of the above symptoms or been diagnosed with COVID  or works in or has recently visited a SNF? no  Asked patient to come in fasting for labs.

## 2019-06-18 NOTE — Progress Notes (Signed)
Subjective:    Patient ID: Leonard Lewis, male    DOB: 1981/03/29, 38 y.o.   MRN: [id number]  This is a pleasant 39 year old obese male previously a patient of the family [facility] as of January 2020.  Since that time the patient wishes to now establish care for care and this is a first visit.  The patient has history of longstanding morbid obesity, chronic systolic heart failure, hypertension, sleep apnea, hyperlipidemia and prediabetes.  The patient last had a sleep study over 10 years ago and tried CPAP but failed that and has not been on CPAP since that time.  The patient's weight is also been slowly increasing.  He had a gastric sleeve placement previously but this failed him.  The patient has been on antihypertensive medication along with treatment for his systolic heart failure however he is not yet gotten to go with his blood pressure.  His dietary compliance during the pandemic has been difficult for him.  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  The patient has been labeled prediabetic and needs follow-up on this.  He does not monitor his blood sugars regularly at home.  Patient was in the emergency room recently for left ankle pain after overuse but this has improved.  His wife observes that he will stop breathing and occasionally waking from sleep short of breath.  He has to sleep with several pillows.  He does have a cardiologist but has not seen that doctor in several years.  There is an echocardiogram from 2016 which showed ejection fraction 40 to 45% with diffuse hypokinesis.  There is no evidence or history of ischemic heart disease in the past.  He has had a cardiac catheterization without obstructive coronary disease seen.  He is never had previous myocardial infarctions.  The patient needs a flu vaccine   Wt Readings from Last 3 Encounters:  06/19/19 (!) 415 lb 6.4 oz (188.4 kg)   06/11/19 (!) 410 lb (186 kg)  10/11/18 (!) 410 lb (186 kg)    Past Medical History:  Diagnosis Date  . Arthritis   . Asthma   . Back pain   . CHF (congestive heart failure) ([place]) 2008   EF 40-50%        . Fatty liver   . Hypertension   . Leg edema   . Obesity, morbid ([place])   . Obstructive sleep apnea   . Prediabetes      Family History  Problem Relation Age of Onset  . Hypertension Mother   . Obesity Mother   . Hypertension Father      Social History   Socioeconomic History  . Marital status: Single    Spouse name: Not on file  . Number of children: Not on file  . Years of education: Not on file  . Highest education level: Not on file  Occupational History  . Occupation: Orthoptist  Social Needs  . Financial resource strain: Not on file  . Food insecurity    Worry: Not on file    Inability: Not on file  . Transportation needs    Medical: Not on file    Non-medical: Not on file  Tobacco Use  . Smoking status: Former Smoker    Types: Cigarettes    Quit date: 10/04/2006    Years since quitting: 12.7  . Smokeless tobacco: Never Used  Substance and Sexual Activity  .  Alcohol use: No  . Drug use: No  . Sexual activity: Not on file  Lifestyle  . Physical activity    Days per week: Not on file    Minutes per session: Not on file  . Stress: Not on file  Relationships  . Social Herbalist on phone: Not on file    Gets together: Not on file    Attends religious service: Not on file    Active member of club or organization: Not on file    Attends meetings of clubs or organizations: Not on file    Relationship status: Not on file  . Intimate partner violence    Fear of current or ex partner: Not on file    Emotionally abused: Not on file    Physically abused: Not on file    Forced sexual activity: Not on file  Other Topics Concern  . 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      No Known  Allergies   Outpatient Medications Prior to Visit  Medication Sig Dispense Refill  . aspirin EC 81 MG tablet Take 81 mg by mouth daily.    Marland Kitchen amLODipine (NORVASC) 10 MG tablet Take 1 tablet (10 mg total) by mouth daily. Needs appt before any more refills or meds given 90 tablet 0  . carvedilol (COREG) 25 MG tablet Take 1 tablet (25 mg total) by mouth 2 (two) times daily. 60 tablet 3  . lisinopril (ZESTRIL) 40 MG tablet Take 1 tablet (40 mg total) by mouth daily. 90 tablet 0  . spironolactone (ALDACTONE) 25 MG tablet Take 1 tablet by mouth daily.     No facility-administered medications prior to visit.     Review of Systems Constitutional:   No  weight loss, night sweats,  Fevers, chills, fatigue, lassitude. HEENT:   No headaches,  Difficulty swallowing,  Tooth/dental problems,  Sore throat,                No sneezing, itching, ear ache, nasal congestion, post nasal drip,   CV:  No chest pain,  Orthopnea, PND, swelling in lower extremities, anasarca, dizziness, palpitations  GI  No heartburn, indigestion, abdominal pain, nausea, vomiting, diarrhea, change in bowel habits, loss of appetite  Resp: No shortness of breath with exertion or at rest.  No excess mucus, no productive cough,  No non-productive cough,  No coughing up of blood.  No change in color of mucus.  No wheezing.  No chest wall deformity  Skin: no rash or lesions.  GU: no dysuria, change in color of urine, no urgency or frequency.  No flank pain.  MS:  No joint pain or swelling.  No decreased range of motion.  No back pain.  Psych:  No change in mood or affect. No depression or anxiety.  No memory loss.     Objective:   Physical Exam Vitals:   06/19/19 1058  BP: (!) 153/108  Pulse: 76  Resp: 18  Temp: (!) 97.2 F (36.2 C)  TempSrc: Temporal  SpO2: 96%  Weight: (!) 415 lb 6.4 oz (188.4 kg)  Height: _0  (1.803 m)    Gen: Pleasant, morbidly obese, in no distress,  normal affect  ENT: No lesions,  mouth  clear,  oropharynx clear, no postnasal drip  Neck: No JVD, no TMG, no carotid bruits  Lungs: No use of accessory muscles, no dullness to percussion, clear without rales or rhonchi  Cardiovascular: RRR, heart sounds normal, no murmur or gallops, no peripheral edema  Abdomen: soft and NT, no HSM,  BS normal  Musculoskeletal: No deformities, no cyanosis or clubbing  Neuro: alert, non focal  Skin: Warm, no lesions or rashes  Previous labs from January 2020 were reviewed  echo Study Conclusions   4/16 - Left ventricle: The cavity size was moderately dilated. Wall   thickness was normal. Systolic function was mildly to moderately   reduced. The estimated ejection fraction was in the range of 40%   to 45%. Diffuse hypokinesis. Akinesis of the basalinferior   myocardium. - Left atrium: The atrium was mildly dilated.  The patient's depression screen is normal anxiety score is low     Assessment & Plan:  I personally reviewed all images and lab data in the [facility] system as well as any outside material available during this office visit and agree with the  radiology impressions.   Chronic systolic heart failure Chronic systolic heart failure with previous ejection fraction 40 to 45% in 2016.  Negative heart catheterization in the past and lack of recent visits to cardiology  Blood pressures not well controlled therefore we will add hydralazine 25 mg 3 times daily continue spironolactone 25 mg daily Coreg 25 mg twice daily and continue amlodipine 10 mg daily along with Zestril 40 mg daily  Referral to cardiology will be made  HYPERTENSION, BENIGN ESSENTIAL Hypertension poorly controlled please review systolic heart failure assessment for medication changes  Obstructive sleep apnea Significant obstructive sleep apnea  Plan will be to refer to pulmonary for repeat sleep study and further evaluations  HLD (hyperlipidemia) Hyperlipidemia in the past will repeat lipid panel  today  Morbid obesity History of morbid obesity which persists previous gastric sleeve which failed  Will refer to bariatric medicine for further evaluation  Prediabetes Prediabetes we will follow-up hemoglobin A1c and complete metabolic panel  Vitamin D deficiency In reviewing labs vitamin D levels have been low in the past will repeat current level and may need to recommend vitamin D supplementation   [place] was seen today for establish care, hypertension and hyperlipidemia.  Diagnoses and all orders for this visit:  Chronic systolic heart failure ([place]) -     Ambulatory referral to Cardiology  Needs flu shot -     Flu Vaccine QUAD 6+ mos PF IM (Fluarix Quad PF)  Obstructive sleep apnea -     Ambulatory referral to Pulmonology  Pure hypercholesterolemia -     Lipid Panel  Prediabetes -     Comprehensive metabolic panel -     Hemoglobin A1c  Morbid obesity ([place]) -     Cancel: Amb Referral to Bariatric Surgery -     Amb Ref to Medical Weight Management  Vitamin D deficiency -     Vitamin D, 25-hydroxy  HYPERTENSION, BENIGN ESSENTIAL -     Thyroid Profile -     TSH -     CBC with Differential/Platelet; Future -     CBC with Differential/Platelet  Other orders -     amLODipine (NORVASC) 10 MG tablet; Take 1 tablet (10 mg total) by mouth daily. Needs appt before any more refills or meds given -     carvedilol (COREG) 25 MG tablet; Take 1 tablet (25 mg total) by mouth 2 (two) times daily. -     lisinopril (ZESTRIL) 40 MG tablet; Take 1 tablet (40 mg total) by mouth daily. -     spironolactone (ALDACTONE) 25  MG tablet; Take 1 tablet (25 mg total) by mouth daily. -     hydrALAZINE (APRESOLINE) 25 MG tablet; Take 1 tablet (25 mg total) by mouth 3 (three) times daily.   Flu vaccine was given at this visit

## 2019-06-19 ENCOUNTER — Ambulatory Visit (INDEPENDENT_AMBULATORY_CARE_PROVIDER_SITE_OTHER): Payer: BC Managed Care – PPO | Admitting: Critical Care Medicine

## 2019-06-19 ENCOUNTER — Other Ambulatory Visit: Payer: Self-pay

## 2019-06-19 VITALS — BP 153/108 | HR 76 | Temp 97.2°F | Resp 18 | Ht 71.0 in | Wt >= 6400 oz

## 2019-06-19 DIAGNOSIS — G4733 Obstructive sleep apnea (adult) (pediatric): Secondary | ICD-10-CM | POA: Diagnosis not present

## 2019-06-19 DIAGNOSIS — E7800 Pure hypercholesterolemia, unspecified: Secondary | ICD-10-CM | POA: Diagnosis not present

## 2019-06-19 DIAGNOSIS — Z23 Encounter for immunization: Secondary | ICD-10-CM

## 2019-06-19 DIAGNOSIS — R7303 Prediabetes: Secondary | ICD-10-CM | POA: Diagnosis not present

## 2019-06-19 DIAGNOSIS — E559 Vitamin D deficiency, unspecified: Secondary | ICD-10-CM | POA: Insufficient documentation

## 2019-06-19 DIAGNOSIS — I10 Essential (primary) hypertension: Secondary | ICD-10-CM

## 2019-06-19 DIAGNOSIS — I5022 Chronic systolic (congestive) heart failure: Secondary | ICD-10-CM

## 2019-06-19 MED ORDER — CARVEDILOL 25 MG PO TABS: 25.0000 mg | ORAL_TABLET | Freq: Two times a day (BID) | ORAL | 1 refills | Status: DC

## 2019-06-19 MED ORDER — LISINOPRIL 40 MG PO TABS: 40.0000 mg | ORAL_TABLET | Freq: Every day | ORAL | 0 refills | Status: DC

## 2019-06-19 MED ORDER — HYDRALAZINE HCL 25 MG PO TABS: 25.0000 mg | ORAL_TABLET | Freq: Three times a day (TID) | ORAL | 1 refills | Status: DC

## 2019-06-19 MED ORDER — AMLODIPINE BESYLATE 10 MG PO TABS: 10.0000 mg | ORAL_TABLET | Freq: Every day | ORAL | 1 refills | Status: DC

## 2019-06-19 MED ORDER — SPIRONOLACTONE 25 MG PO TABS: 25.0000 mg | ORAL_TABLET | Freq: Every day | ORAL | 1 refills | Status: DC

## 2019-06-19 NOTE — Assessment & Plan Note (Signed)
Prediabetes we will follow-up hemoglobin A1c and complete metabolic panel

## 2019-06-19 NOTE — Assessment & Plan Note (Signed)
Hyperlipidemia in the past will repeat lipid panel today

## 2019-06-19 NOTE — Progress Notes (Signed)
Doesn't check BP at home.  Denies chest pain, headaches, lower extremity swelling, dizziness, palpitations.  Would like flu shot.  Is fasting.  Ran out of Lisinopril yesterday.

## 2019-06-19 NOTE — Patient Instructions (Addendum)
A flu shot was given  Refills on your medications [facility]  Please begin hydralazine 1 pill 3 times daily for your blood pressure and heart failure in addition to your other medications  Labs today will include a metabolic profile, thyroid and lipid panel, complete blood count, vitamin D levels, and hemoglobin A1c  We will have you return for short-term follow-up in 2 months to meet the new primary care provider here  Referral to bariatric medicine was made and also referral back to cardiology Dr. Terrence Dupont was made along with a referral to pulmonary medicine for a sleep evaluation

## 2019-06-19 NOTE — Assessment & Plan Note (Signed)
In reviewing labs vitamin D levels have been low in the past will repeat current level and may need to recommend vitamin D supplementation

## 2019-06-19 NOTE — Assessment & Plan Note (Signed)
Hypertension poorly controlled please review systolic heart failure assessment for medication changes

## 2019-06-19 NOTE — Assessment & Plan Note (Signed)
Significant obstructive sleep apnea  Plan will be to refer to pulmonary for repeat sleep study and further evaluations

## 2019-06-19 NOTE — Assessment & Plan Note (Signed)
History of morbid obesity which persists previous gastric sleeve which failed  Will refer to bariatric medicine for further evaluation

## 2019-06-19 NOTE — Assessment & Plan Note (Signed)
Chronic systolic heart failure with previous ejection fraction 40 to 45% in 2016.  Negative heart catheterization in the past and lack of recent visits to cardiology  Blood pressures not well controlled therefore we will add hydralazine 25 mg 3 times daily continue spironolactone 25 mg daily Coreg 25 mg twice daily and continue amlodipine 10 mg daily along with Zestril 40 mg daily  Referral to cardiology will be made

## 2019-06-20 ENCOUNTER — Other Ambulatory Visit: Payer: Self-pay | Admitting: Critical Care Medicine

## 2019-06-20 LAB — CBC WITH DIFFERENTIAL/PLATELET
Basophils Absolute: 0.1 10*3/uL (ref 0.0–0.2)
Basos: 1 %
EOS (ABSOLUTE): 0.3 10*3/uL (ref 0.0–0.4)
Eos: 5 %
Hematocrit: 40.9 % (ref 37.5–51.0)
Hemoglobin: 12.7 g/dL — ABNORMAL LOW (ref 13.0–17.7)
Immature Grans (Abs): 0 10*3/uL (ref 0.0–0.1)
Immature Granulocytes: 0 %
Lymphocytes Absolute: 2.3 10*3/uL (ref 0.7–3.1)
Lymphs: 41 %
MCH: 22.2 pg — ABNORMAL LOW (ref 26.6–33.0)
MCHC: 31.1 g/dL — ABNORMAL LOW (ref 31.5–35.7)
MCV: 72 fL — ABNORMAL LOW (ref 79–97)
Monocytes Absolute: 0.4 10*3/uL (ref 0.1–0.9)
Monocytes: 8 %
Neutrophils Absolute: 2.6 10*3/uL (ref 1.4–7.0)
Neutrophils: 45 %
Platelets: 350 10*3/uL (ref 150–450)
RBC: 5.72 x10E6/uL (ref 4.14–5.80)
RDW: 15.1 % (ref 11.6–15.4)
WBC: 5.7 10*3/uL (ref 3.4–10.8)

## 2019-06-20 LAB — COMPREHENSIVE METABOLIC PANEL
ALT: 22 IU/L (ref 0–44)
AST: 17 IU/L (ref 0–40)
Albumin/Globulin Ratio: 1.3 (ref 1.2–2.2)
Albumin: 4.3 g/dL (ref 4.0–5.0)
Alkaline Phosphatase: 63 IU/L (ref 39–117)
BUN/Creatinine Ratio: 13 (ref 9–20)
BUN: 17 mg/dL (ref 6–20)
Bilirubin Total: 0.3 mg/dL (ref 0.0–1.2)
CO2: 24 mmol/L (ref 20–29)
Calcium: 9.9 mg/dL (ref 8.7–10.2)
Chloride: 103 mmol/L (ref 96–106)
Creatinine, Ser: 1.28 mg/dL — ABNORMAL HIGH (ref 0.76–1.27)
GFR calc Af Amer: 82 mL/min/{1.73_m2} (ref >59)
GFR calc non Af Amer: 71 mL/min/{1.73_m2} (ref >59)
Globulin, Total: 3.3 g/dL (ref 1.5–4.5)
Glucose: 87 mg/dL (ref 65–99)
Potassium: 4.8 mmol/L (ref 3.5–5.2)
Sodium: 140 mmol/L (ref 134–144)
Total Protein: 7.6 g/dL (ref 6.0–8.5)

## 2019-06-20 LAB — LIPID PANEL
Chol/HDL Ratio: 4.7 ratio (ref 0.0–5.0)
Cholesterol, Total: 189 mg/dL (ref 100–199)
HDL: 40 mg/dL (ref >39)
LDL Chol Calc (NIH): 115 mg/dL — ABNORMAL HIGH (ref 0–99)
Triglycerides: 191 mg/dL — ABNORMAL HIGH (ref 0–149)
VLDL Cholesterol Cal: 34 mg/dL (ref 5–40)

## 2019-06-20 LAB — THYROID PANEL
Free Thyroxine Index: 2.4 (ref 1.2–4.9)
T3 Uptake Ratio: 29 % (ref 24–39)
T4, Total: 8.2 ug/dL (ref 4.5–12.0)

## 2019-06-20 LAB — HEMOGLOBIN A1C
Est. average glucose Bld gHb Est-mCnc: 120 mg/dL
Hgb A1c MFr Bld: 5.8 % — ABNORMAL HIGH (ref 4.8–5.6)

## 2019-06-20 LAB — TSH: TSH: 1.19 u[IU]/mL (ref 0.450–4.500)

## 2019-06-20 LAB — VITAMIN D 25 HYDROXY (VIT D DEFICIENCY, FRACTURES): Vit D, 25-Hydroxy: 11.9 ng/mL — ABNORMAL LOW (ref 30.0–100.0)

## 2019-06-20 MED ORDER — ATORVASTATIN CALCIUM 20 MG PO TABS: 20.0000 mg | ORAL_TABLET | Freq: Every day | ORAL | 3 refills | Status: DC

## 2019-06-20 NOTE — Progress Notes (Signed)
HLD with LDL elevated. Will start atorvastatin 20mg  per day  Needs vit D supp

## 2019-07-10 DIAGNOSIS — I5020 Unspecified systolic (congestive) heart failure: Secondary | ICD-10-CM | POA: Diagnosis not present

## 2019-07-10 DIAGNOSIS — I10 Essential (primary) hypertension: Secondary | ICD-10-CM | POA: Diagnosis not present

## 2019-07-10 DIAGNOSIS — I420 Dilated cardiomyopathy: Secondary | ICD-10-CM | POA: Diagnosis not present

## 2019-07-10 DIAGNOSIS — E785 Hyperlipidemia, unspecified: Secondary | ICD-10-CM | POA: Diagnosis not present

## 2019-07-25 ENCOUNTER — Other Ambulatory Visit: Payer: Self-pay | Admitting: Pharmacist

## 2019-07-25 MED ORDER — HYDRALAZINE HCL 25 MG PO TABS: 25.0000 mg | ORAL_TABLET | Freq: Three times a day (TID) | ORAL | 2 refills | Status: DC

## 2019-08-20 ENCOUNTER — Telehealth: Payer: Self-pay

## 2019-08-20 NOTE — Telephone Encounter (Signed)
Called patient to do their pre-visit COVID screening.  Patient states that he needs to reschedule. Appointment moved to 09/11/2019 @ 1:50 PM.

## 2019-08-21 ENCOUNTER — Ambulatory Visit: Payer: BC Managed Care – PPO

## 2019-09-11 ENCOUNTER — Ambulatory Visit: Payer: BC Managed Care – PPO

## 2019-12-24 ENCOUNTER — Telehealth: Payer: Self-pay

## 2019-12-24 NOTE — Telephone Encounter (Signed)

## 2019-12-26 ENCOUNTER — Other Ambulatory Visit: Payer: Self-pay

## 2019-12-26 ENCOUNTER — Ambulatory Visit (INDEPENDENT_AMBULATORY_CARE_PROVIDER_SITE_OTHER): Payer: BC Managed Care – PPO | Admitting: Internal Medicine

## 2019-12-26 ENCOUNTER — Encounter: Payer: Self-pay | Admitting: Internal Medicine

## 2019-12-26 VITALS — BP 135/92 | HR 90 | Temp 97.3°F | Resp 18 | Ht 71.0 in | Wt >= 6400 oz

## 2019-12-26 DIAGNOSIS — G4733 Obstructive sleep apnea (adult) (pediatric): Secondary | ICD-10-CM

## 2019-12-26 DIAGNOSIS — J302 Other seasonal allergic rhinitis: Secondary | ICD-10-CM

## 2019-12-26 DIAGNOSIS — E559 Vitamin D deficiency, unspecified: Secondary | ICD-10-CM

## 2019-12-26 DIAGNOSIS — E782 Mixed hyperlipidemia: Secondary | ICD-10-CM

## 2019-12-26 DIAGNOSIS — R7303 Prediabetes: Secondary | ICD-10-CM | POA: Diagnosis not present

## 2019-12-26 DIAGNOSIS — I10 Essential (primary) hypertension: Secondary | ICD-10-CM

## 2019-12-26 DIAGNOSIS — N469 Male infertility, unspecified: Secondary | ICD-10-CM

## 2019-12-26 MED ORDER — FLUTICASONE PROPIONATE 50 MCG/ACT NA SUSP: 2.0000 | Freq: Every day | NASAL | 6 refills | Status: DC

## 2019-12-26 NOTE — Progress Notes (Signed)
Subjective:    Leonard Lewis - 39 y.o. male MRN [id number]  Date of birth: Dec 16, 1980  HPI  Leonard Lewis is here for follow up of chronic medical conditions.   Chronic HTN Disease Monitoring:  Home BP Monitoring - Monitors occasionally. Was 154/99 last week.  Chest pain- no  Dyspnea- no Headache - occasionally has slight headache  Medications: Amlodipine 10 mg, Coreg 25 mg BID, Hydralazine 25 mg TID, Spironolactone 25 mg, Entresto 49-51 mg  Compliance- no Lightheadedness- no  Edema- no   Reports has missed some of his Lipitor doses.  Concerned his sleep apnea is coming back. Wife has noticed that he stops breathing during sleep and that is snoring. Wakes up with morning headaches. Does not experience daytime sleepiness. Has a diagnosis of sleep apnea in past. Used to use CPAP machine in 2015-2016. Felt like he was suffocating and stopped using it. Not sure where it is. He had gastric sleeve surgery done in 2016. Has regained most of weight back.    Health Maintenance:  There are no preventive care reminders to display for this patient.  -  reports that he quit smoking about 13 years ago. His smoking use included cigarettes. He has never used smokeless tobacco. - Review of Systems: Per HPI. - Past Medical History: Patient Active Problem List   Diagnosis Date Noted  . Vitamin D deficiency 06/19/2019  . Prediabetes 01/03/2018  . 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 Chronic systolic heart failure ([place]) 10/30/2013  . Obstructive sleep apnea 05/31/2007  . HYPERTENSION, BENIGN ESSENTIAL 05/02/2007  . HLD (hyperlipidemia) 03/17/2007  . Morbid obesity ([place]) 03/17/2007   - Medications: reviewed and updated   Objective:   Physical Exam BP (!) 135/92   Pulse 90   Temp (!) 97.3 F (36.3 C) (Temporal)   Resp 18   Ht 5\' 11"  (1.803 m)   Wt (!) 421 lb (191 kg)   SpO2 95%   BMI 58.72 kg/m  Physical Exam  Constitutional: He is  oriented to person, place, and time and well-developed, well-nourished, and in no distress. No distress.  HENT:  Head: Normocephalic and atraumatic.  Eyes: Conjunctivae and EOM are normal.  Cardiovascular: Normal rate, regular rhythm and normal heart sounds.  No murmur heard. Pulmonary/Chest: Effort normal and breath sounds normal. No respiratory distress.  Musculoskeletal:        General: Normal range of motion.  Neurological: He is alert and oriented to person, place, and time.  Skin: Skin is warm and dry. He is not diaphoretic.  Psychiatric: Affect and judgment normal.           Assessment & Plan:   1. HYPERTENSION, BENIGN ESSENTIAL BP close to goal today, diastolic slightly elevated. Continue current regimen. Followed byc cardiology.  Counseled on blood pressure goal of less than 130/80, low-sodium, DASH diet, medication compliance, 150 minutes of moderate intensity exercise per week. Discussed medication compliance, adverse effects.  2. Mixed hyperlipidemia Patient started on Atorvastatin 20 mg in Sept 2020. Reports intermittent compliance. Check LDL today.  - LDL Cholesterol, Direct  3. Vitamin D deficiency Last level was 11.9 in Sept 2020. Patient does not believe he took weekly supplementation but is not completely sure. Will check level today.  - VITAMIN D 25 Hydroxy (Vit-D Deficiency, Fractures)  4. Prediabetes Last A1c 5.8% in Sept 2020. Patient on Farxiga prescribed by cardiology.  - Hemoglobin A1c  5. OSA (obstructive sleep apnea) Will have consult visit set up with  Dr. Delford Field. Patient will likely need repeat sleep study as sounds like it has been >5 years without study or CPAP machine. Certainly has symptoms concerning for OSA and at risk given obesity.   6. Seasonal allergic rhinitis, unspecified trigger - fluticasone (FLONASE) 50 MCG/ACT nasal spray; Place 2 sprays into both nostrils daily.  Dispense: 16 g; Refill: 6  7. 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  - Ambulatory referral to Urology     Marcy Siren, D.O. 12/26/2019, 2:49 PM Primary Care at [facility]

## 2019-12-26 NOTE — Patient Instructions (Signed)
Thank you for choosing Primary Care at [facility] to be your medical home!    Leonard Lewis was seen by De Hollingshead, DO today.   Leonard Lewis primary care provider is Leonard Siren, DO.   For the best care possible, you should try to see Leonard Siren, DO whenever you come to the clinic.   We look forward to seeing you again soon!  If you have any questions about your visit today, please call us at [phone] or feel free to reach your primary care provider via MyChart.

## 2019-12-27 ENCOUNTER — Other Ambulatory Visit: Payer: Self-pay | Admitting: Internal Medicine

## 2019-12-27 ENCOUNTER — Other Ambulatory Visit: Payer: Self-pay

## 2019-12-27 DIAGNOSIS — E559 Vitamin D deficiency, unspecified: Secondary | ICD-10-CM

## 2019-12-27 LAB — VITAMIN D 25 HYDROXY (VIT D DEFICIENCY, FRACTURES): Vit D, 25-Hydroxy: 13.3 ng/mL — ABNORMAL LOW (ref 30.0–100.0)

## 2019-12-27 LAB — HEMOGLOBIN A1C
Est. average glucose Bld gHb Est-mCnc: 123 mg/dL
Hgb A1c MFr Bld: 5.9 % — ABNORMAL HIGH (ref 4.8–5.6)

## 2019-12-27 LAB — LDL CHOLESTEROL, DIRECT: LDL Direct: 119 mg/dL — ABNORMAL HIGH (ref 0–99)

## 2019-12-27 MED ORDER — VITAMIN D (ERGOCALCIFEROL) 1.25 MG (50000 UNIT) PO CAPS: 50000.0000 [IU] | ORAL_CAPSULE | ORAL | 0 refills | Status: DC

## 2019-12-28 MED ORDER — LISINOPRIL 40 MG PO TABS: 40.0000 mg | ORAL_TABLET | Freq: Every day | ORAL | 0 refills | Status: DC

## 2020-01-01 DIAGNOSIS — Z20828 Contact with and (suspected) exposure to other viral communicable diseases: Secondary | ICD-10-CM | POA: Diagnosis not present

## 2020-01-01 DIAGNOSIS — Z03818 Encounter for observation for suspected exposure to other biological agents ruled out: Secondary | ICD-10-CM | POA: Diagnosis not present

## 2020-01-08 ENCOUNTER — Encounter: Payer: Self-pay | Admitting: Critical Care Medicine

## 2020-01-08 ENCOUNTER — Other Ambulatory Visit: Payer: Self-pay

## 2020-01-08 ENCOUNTER — Ambulatory Visit: Payer: BC Managed Care – PPO | Attending: Critical Care Medicine | Admitting: Critical Care Medicine

## 2020-01-08 VITALS — BP 176/139 | HR 88 | Temp 97.7°F | Ht 71.0 in | Wt >= 6400 oz

## 2020-01-08 DIAGNOSIS — G4733 Obstructive sleep apnea (adult) (pediatric): Secondary | ICD-10-CM

## 2020-01-08 DIAGNOSIS — I10 Essential (primary) hypertension: Secondary | ICD-10-CM | POA: Diagnosis not present

## 2020-01-08 MED ORDER — HYDRALAZINE HCL 25 MG PO TABS: 50.0000 mg | ORAL_TABLET | Freq: Three times a day (TID) | ORAL | 2 refills | Status: DC

## 2020-01-08 NOTE — Progress Notes (Signed)
Subjective:    Patient ID: Leonard Lewis, male    DOB: May 06, 1981, 39 y.o.   MRN: [id number]  This is a pleasant 40 year old obese male previously a patient of the family [place] as of January 2020.  Since that time the patient wishes to now establish care for care and this is a first visit.  The patient has history of longstanding morbid obesity, chronic systolic heart failure, hypertension, sleep apnea, hyperlipidemia and prediabetes.  The patient last had a sleep study over 10 years ago and tried CPAP but failed that and has not been on CPAP since that time.  The patient's weight is also been slowly increasing.  He had a gastric sleeve placement previously but this failed him.  The patient has been on antihypertensive medication along with treatment for his systolic heart failure however he is not yet gotten to go with his blood pressure.  His dietary compliance during the pandemic has been difficult for him.  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  The patient has been labeled prediabetic and needs follow-up on this.  He does not monitor his blood sugars regularly at home.  Patient was in the emergency room recently for left ankle pain after overuse but this has improved.  His wife observes that he will stop breathing and occasionally waking from sleep short of breath.  He has to sleep with several pillows.  He does have a cardiologist but has not seen that doctor in several years.  There is an echocardiogram from 2016 which showed ejection fraction 40 to 45% with diffuse hypokinesis.  There is no evidence or history of ischemic heart disease in the past.  He has had a cardiac catheterization without obstructive coronary disease seen.  He is never had previous myocardial infarctions.  The patient needs a flu vaccine  01/08/2020 The patient returns on referral from his new primary care provider for  evaluation of sleep apnea.  I attempted to make a referral to the sleep clinic in September but no appointment was ever made or kept.  The patient first diagnosed with sleep apnea in 2014.  He had previously done a sleep study at that time which showed significant disease.  He however has not used his CPAP in 5 years as he did not like the effect of the CPAP and it did not interface with him well.  His current machine no longer is operating.  The patient lost weight seem to improve but now has gained weight again and is worse  The patient scores high on the Epworth scale  Patient has significant daytime hypersomnolence snoring and observed apneas at night.  He has decreased memory decreased focus he is referred now back to our clinic to see if we can achieve for him a sleep study and CPAP therapy  Wt Readings from Last 3 Encounters:  01/08/20 (!) 428 lb 6.4 oz (194.3 kg)  12/26/19 (!) 421 lb (191 kg)  06/19/19 (!) 415 lb 6.4 oz (188.4 kg)    Past Medical History:  Diagnosis Date  . Arthritis   . Asthma   . Back pain   . CHF (congestive heart failure) ([place]) 2008   EF 40-50%        . Fatty liver   . Hypertension   . Leg edema   . Obesity, morbid ([place])   . Obstructive sleep apnea   . Prediabetes  Family History  Problem Relation Age of Onset  . Hypertension Mother   . Obesity Mother   . Hypertension Father      Social History   Socioeconomic History  . Marital status: Single    Spouse name: Not on file  . Number of children: Not on file  . Years of education: Not on file  . Highest education level: Not on file  Occupational History  . Occupation: Orthoptist  Tobacco Use  . Smoking status: Former Smoker    Types: Cigarettes    Quit date: 10/04/2006    Years since quitting: 13.2  . Smokeless tobacco: Never Used  Substance and Sexual Activity  . Alcohol use: No  . Drug use: No  . Sexual activity: Not on file  Other Topics Concern  . 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    Social Determinants of Health   Financial Resource Strain:   . Difficulty of Paying Living Expenses:   Food Insecurity:   . Worried About Charity fundraiser in the Last Year:   . Arboriculturist in the Last Year:   Transportation Needs:   . Film/video editor (Medical):   Marland Kitchen Lack of Transportation (Non-Medical):   Physical Activity:   . Days of Exercise per Week:   . Minutes of Exercise per Session:   Stress:   . Feeling of Stress :   Social Connections:   . Frequency of Communication with Friends and Family:   . Frequency of Social Gatherings with Friends and Family:   . Attends Religious Services:   . Active Member of Clubs or Organizations:   . Attends Archivist Meetings:   Marland Kitchen Marital Status:   Intimate Partner Violence:   . Fear of Current or Ex-Partner:   . Emotionally Abused:   Marland Kitchen Physically Abused:   . Sexually Abused:      No Known Allergies   Outpatient Medications Prior to Visit  Medication Sig Dispense Refill  . amLODipine (NORVASC) 10 MG tablet Take 1 tablet (10 mg total) by mouth daily. Needs appt before any more refills or meds given 90 tablet 1  . aspirin EC 81 MG tablet Take 81 mg by mouth daily.    Marland Kitchen atorvastatin (LIPITOR) 20 MG tablet Take 1 tablet (20 mg total) by mouth daily. 90 tablet 3  . carvedilol (COREG) 25 MG tablet Take 1 tablet (25 mg total) by mouth 2 (two) times daily. 180 tablet 1  . ENTRESTO 49-51 MG Take 1 tablet by mouth 2 (two) times daily.    Marland Kitchen FARXIGA 10 MG TABS tablet Take 10 mg by mouth daily.    . fluticasone (FLONASE) 50 MCG/ACT nasal spray Place 2 sprays into both nostrils daily. 16 g 6  . lisinopril (ZESTRIL) 40 MG tablet Take 1 tablet (40 mg total) by mouth daily. 90 tablet 0  . spironolactone (ALDACTONE) 25 MG tablet Take 1 tablet (25 mg total) by mouth daily. 90 tablet 1  . Vitamin D, Ergocalciferol, (DRISDOL) 1.25 MG (50000 UNIT) CAPS capsule Take  1 capsule (50,000 Units total) by mouth every 7 (seven) days. 12 capsule 0  . hydrALAZINE (APRESOLINE) 25 MG tablet Take 1 tablet (25 mg total) by mouth 3 (three) times daily. 90 tablet 2   No facility-administered medications prior to visit.    Review of Systems  Constitutional:   No  weight loss, night sweats,  Fevers, chills,  fatigue, lassitude. HEENT:   No headaches,  Difficulty swallowing,  Tooth/dental problems,  Sore throat,                No sneezing, itching, ear ache, nasal congestion, post nasal drip,   CV:  No chest pain,  Orthopnea, PND, swelling in lower extremities, anasarca, dizziness, palpitations  GI  No heartburn, indigestion, abdominal pain, nausea, vomiting, diarrhea, change in bowel habits, loss of appetite  Resp: No shortness of breath with exertion or at rest.  No excess mucus, no productive cough,  No non-productive cough,  No coughing up of blood.  No change in color of mucus.  No wheezing.  No chest wall deformity  Skin: no rash or lesions.  GU: no dysuria, change in color of urine, no urgency or frequency.  No flank pain.  MS:  No joint pain or swelling.  No decreased range of motion.  No back pain.  Psych:  No change in mood or affect. No depression or anxiety.  No memory loss.     Objective:   Physical Exam  Vitals:   01/08/20 1511 01/08/20 1520  BP: (!) 189/132 (!) 176/139  Pulse: 88   Temp: 97.7 F (36.5 C)   TempSrc: Oral   SpO2: 97%   Weight: (!) 428 lb 6.4 oz (194.3 kg)   Height: 5' 11"  (1.803 m)     Gen: Pleasant, morbidly obese, in no distress,  normal affect  ENT: No lesions,  mouth clear,  oropharynx clear, no postnasal drip  Neck: No JVD, no TMG, no carotid bruits  Lungs: No use of accessory muscles, no dullness to percussion, clear without rales or rhonchi  Cardiovascular: RRR, heart sounds normal, no murmur or gallops, no peripheral edema  Abdomen: soft and NT, no HSM,  BS normal  Musculoskeletal: No deformities, no  cyanosis or clubbing  Neuro: alert, non focal  Skin: Warm, no lesions or rashes  Previous labs from January 2020 were reviewed  echo Study Conclusions   4/16 - Left ventricle: The cavity size was moderately dilated. Wall   thickness was normal. Systolic function was mildly to moderately   reduced. The estimated ejection fraction was in the range of 40%   to 45%. Diffuse hypokinesis. Akinesis of the basalinferior   myocardium. - Left atrium: The atrium was mildly dilated.  Results of the Epworth flowsheet 01/08/2020 11/21/2013  Sitting and reading 2 3  Watching TV 3 3  Sitting, inactive in a public place (e.g. a theatre or a meeting) 0 2  As a passenger in a car for an hour without a break 2 3  Lying down to rest in the afternoon when circumstances permit 3 3  Sitting and talking to someone 0 0  Sitting quietly after a lunch without alcohol 3 2  In a car, while stopped for a few minutes in traffic 0 1  Total score 13 17        Assessment & Plan:  I personally reviewed all images and lab data in the [facility] system as well as any outside material available during this office visit and agree with the  radiology impressions.   Obstructive sleep apnea Severe obstructive sleep apnea needing repeat sleep study in order to achieve another CPAP machine  I will attempt to order a home sleep study for this patient and following that we will prescribe CPAP therapy    HYPERTENSION, BENIGN ESSENTIAL Poorly controlled hypertension with chronic systolic heart failure  Part of the  control of hypertension is difficult with the issue of untreated sleep apnea  The patient does need access to [facility] and we were able to achieve for the patient a $10 co-pay coupon card  We will increase hydralazine and 50 mg 3 times daily   Diagnoses and all orders for this visit:  Obstructive sleep apnea -     Home sleep test; Future  HYPERTENSION, BENIGN ESSENTIAL  Other orders -     hydrALAZINE  (APRESOLINE) 25 MG tablet; Take 2 tablets (50 mg total) by mouth 3 (three) times daily.

## 2020-01-08 NOTE — Assessment & Plan Note (Signed)
Severe obstructive sleep apnea needing repeat sleep study in order to achieve another CPAP machine  I will attempt to order a home sleep study for this patient and following that we will prescribe CPAP therapy

## 2020-01-08 NOTE — Patient Instructions (Addendum)
A home sleep study will be obtained  Increase hydralazine to 50mg  three times daily (2  25mg  tabs)  Pharmacy has copay reduction cards for Farxiga and entresto, please pick up on way out  DR will call with sleep study results

## 2020-01-08 NOTE — Progress Notes (Signed)
Wants to talk about possible sleep apnea test, per pt he had one done approx. 10 years ago

## 2020-01-08 NOTE — Assessment & Plan Note (Addendum)
Poorly controlled hypertension with chronic systolic heart failure  Part of the control of hypertension is difficult with the issue of untreated sleep apnea  The patient does need access to [facility] and we were able to achieve for the patient a $10 co-pay coupon card  We will increase hydralazine and 50 mg 3 times daily

## 2020-01-11 DIAGNOSIS — Z23 Encounter for immunization: Secondary | ICD-10-CM | POA: Diagnosis not present

## 2020-01-14 NOTE — Progress Notes (Signed)
Patient notified of results & recommendations. Expressed understanding.

## 2020-01-24 DIAGNOSIS — R040 Epistaxis: Secondary | ICD-10-CM | POA: Diagnosis not present

## 2020-01-24 DIAGNOSIS — I10 Essential (primary) hypertension: Secondary | ICD-10-CM | POA: Diagnosis not present

## 2020-01-24 DIAGNOSIS — J351 Hypertrophy of tonsils: Secondary | ICD-10-CM | POA: Insufficient documentation

## 2020-01-24 DIAGNOSIS — G4730 Sleep apnea, unspecified: Secondary | ICD-10-CM | POA: Diagnosis not present

## 2020-02-25 ENCOUNTER — Other Ambulatory Visit: Payer: Self-pay

## 2020-02-25 ENCOUNTER — Ambulatory Visit (HOSPITAL_BASED_OUTPATIENT_CLINIC_OR_DEPARTMENT_OTHER): Payer: BC Managed Care – PPO | Attending: Critical Care Medicine | Admitting: Cardiology

## 2020-02-25 DIAGNOSIS — G4733 Obstructive sleep apnea (adult) (pediatric): Secondary | ICD-10-CM | POA: Diagnosis not present

## 2020-02-25 DIAGNOSIS — R0902 Hypoxemia: Secondary | ICD-10-CM | POA: Insufficient documentation

## 2020-02-26 NOTE — Procedures (Signed)
   Patient Name: Leonard Lewis, Leonard Lewis Date: 02/25/2020 Gender: Male D.O.B: 07-12-1981 Age (years): 38 Referring Provider: Shan Levans Height (inches): 71 Interpreting Physician: Armanda Magic MD, ABSM Weight (lbs): 428 RPSGT: [place] BMI: 60 MRN: [id number] Neck Size: 21.00  CLINICAL INFORMATION Sleep Study Type: HST  Indication for sleep study: N/A  Epworth Sleepiness Score: 13  SLEEP STUDY TECHNIQUE A multi-channel overnight portable sleep study was performed. The channels recorded were: nasal airflow, thoracic respiratory movement, and oxygen saturation with a pulse oximetry. Snoring was also monitored.  MEDICATIONS Patient self administered medications include: N/A.  SLEEP ARCHITECTURE Patient was studied for 441.3 minutes. The sleep efficiency was 100.0 % and the patient was supine for 34.3%. The arousal index was 0.0 per hour.  RESPIRATORY PARAMETERS The overall AHI was 79.7 per hour, with a central apnea index of 0.1 per hour.  The oxygen nadir was 68% during sleep.  CARDIAC DATA Mean heart rate during sleep was 70.8 bpm.  IMPRESSIONS - Severe obstructive sleep apnea occurred during this study (AHI = 79.7/h). - No significant central sleep apnea occurred during this study (CAI = 0.1/h). - Severe oxygen desaturation was noted during this study (Min O2 = 68%). - Patient snored 13.2% during the sleep.  DIAGNOSIS - Obstructive Sleep Apnea (327.23 [G47.33 ICD-10]) - Nocturnal Hypoxemia (327.26 [G47.36 ICD-10])  RECOMMENDATIONS - Recommend in lab CPAP titration due to severity of OSA and hypoxemia.  - Avoid alcohol, sedatives and other CNS depressants that may worsen sleep apnea and disrupt normal sleep architecture. - Sleep hygiene should be reviewed to assess factors that may improve sleep quality. - Weight management and regular exercise should be initiated or continued.  [Electronically signed] 02/26/2020 01:37 PM  Armanda Magic MD,  ABSM Diplomate, American Board of Sleep Medicine

## 2020-02-27 ENCOUNTER — Telehealth: Payer: Self-pay | Admitting: *Deleted

## 2020-02-27 NOTE — Telephone Encounter (Signed)
-----   Message from Traci R Turner, MD sent at 02/26/2020  1:41 PM EDT ----- Disregard order for in lab CPAP titration - patient is followed by Dr. Wright and he will address the results of study        

## 2020-03-06 ENCOUNTER — Telehealth: Payer: Self-pay | Admitting: *Deleted

## 2020-03-06 NOTE — Telephone Encounter (Addendum)
-----   Message from Quintella Reichert, MD sent at 02/26/2020  1:41 PM EDT ----- Disregard order for in lab CPAP titration - patient is followed by Dr. Delford Field and he will address the results of study

## 2020-03-06 NOTE — Telephone Encounter (Signed)
-----   Message from Quintella Reichert, MD sent at 02/26/2020  1:39 PM EDT ----- Please let patient know that they have sleep apnea and recommend CPAP titration. Please set up titration in the sleep lab.

## 2020-03-14 ENCOUNTER — Other Ambulatory Visit: Payer: Self-pay

## 2020-03-14 MED ORDER — AMLODIPINE BESYLATE 10 MG PO TABS: 10.0000 mg | ORAL_TABLET | Freq: Every day | ORAL | 0 refills | Status: DC

## 2020-03-14 MED ORDER — CARVEDILOL 25 MG PO TABS: 25.0000 mg | ORAL_TABLET | Freq: Two times a day (BID) | ORAL | 0 refills | Status: DC

## 2020-03-17 ENCOUNTER — Telehealth: Payer: Self-pay | Admitting: Cardiology

## 2020-03-17 NOTE — Telephone Encounter (Signed)
New message  Patient states that he is returning a missed call. Please give patient a call back.

## 2020-03-20 NOTE — Telephone Encounter (Signed)
Informed patient since he is followed by Dr. Delford Field dr Mayford Knife says he will address the results of study with him. Pt was agreeable to treatment.

## 2020-04-29 ENCOUNTER — Ambulatory Visit: Payer: BC Managed Care – PPO | Attending: Critical Care Medicine | Admitting: Critical Care Medicine

## 2020-04-29 ENCOUNTER — Other Ambulatory Visit: Payer: Self-pay

## 2020-04-29 ENCOUNTER — Telehealth: Payer: Self-pay

## 2020-04-29 ENCOUNTER — Encounter: Payer: Self-pay | Admitting: Critical Care Medicine

## 2020-04-29 VITALS — BP 148/90 | HR 83 | Temp 98.3°F | Resp 18 | Ht 72.0 in | Wt >= 6400 oz

## 2020-04-29 DIAGNOSIS — G4733 Obstructive sleep apnea (adult) (pediatric): Secondary | ICD-10-CM

## 2020-04-29 DIAGNOSIS — I5022 Chronic systolic (congestive) heart failure: Secondary | ICD-10-CM

## 2020-04-29 DIAGNOSIS — I10 Essential (primary) hypertension: Secondary | ICD-10-CM | POA: Diagnosis not present

## 2020-04-29 DIAGNOSIS — E559 Vitamin D deficiency, unspecified: Secondary | ICD-10-CM | POA: Diagnosis not present

## 2020-04-29 MED ORDER — ENTRESTO 49-51 MG PO TABS: 1.0000 | ORAL_TABLET | Freq: Two times a day (BID) | ORAL | 1 refills | Status: DC

## 2020-04-29 MED ORDER — FARXIGA 10 MG PO TABS: 10.0000 mg | ORAL_TABLET | Freq: Every day | ORAL | 1 refills | Status: DC

## 2020-04-29 MED FILL — ENTRESTO 49 MG-51 MG TABLET: 49-51 | 30 days supply | Qty: 60 | Fill #0

## 2020-04-29 MED FILL — FARXIGA 10 MG TABLET: 10 | 30 days supply | Qty: 30 | Fill #0

## 2020-04-29 NOTE — Assessment & Plan Note (Signed)
I contacted our pharmacist and we can obtain for this patient the Sherryll Burger we will discontinue lisinopril and begin the [facility] as prescribed at a dose of 49/51 1 twice daily

## 2020-04-29 NOTE — Progress Notes (Signed)
Hereto f /u results on his sleep study

## 2020-04-29 NOTE — Telephone Encounter (Signed)
Order received for CPAP.  Call placed to patient and explained need to identify a DME company that is in network with his insurance.  He had no preference for companies and was in agreement to have referral sent to [place].   If they are not in network, will then send to another DME provider  Referral faxed to [facility]

## 2020-04-29 NOTE — Assessment & Plan Note (Signed)
Documented prior history of vitamin D deficiency the patient needs refills on this he will receive 50,000 units weekly and I did send a refill in for him at this visit

## 2020-04-29 NOTE — Progress Notes (Signed)
Subjective:    Patient ID: Leonard Lewis, male    DOB: 1981/03/22, 39 y.o.   MRN: [id number]  This is a pleasant 39 year old obese male previously a patient of the family [place] as of January 2020.  Since that time the patient wishes to now establish care for care and this is a first visit.  The patient has history of longstanding morbid obesity, chronic systolic heart failure, hypertension, sleep apnea, hyperlipidemia and prediabetes.  The patient last had a sleep study over 10 years ago and tried CPAP but failed that and has not been on CPAP since that time.  The patient's weight is also been slowly increasing.  He had a gastric sleeve placement previously but this failed him.  The patient has been on antihypertensive medication along with treatment for his systolic heart failure however he is not yet gotten to go with his blood pressure.  His dietary compliance during the pandemic has been difficult for him.  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  The patient has been labeled prediabetic and needs follow-up on this.  He does not monitor his blood sugars regularly at home.  Patient was in the emergency room recently for left ankle pain after overuse but this has improved.  His wife observes that he will stop breathing and occasionally waking from sleep short of breath.  He has to sleep with several pillows.  He does have a cardiologist but has not seen that doctor in several years.  There is an echocardiogram from 2016 which showed ejection fraction 40 to 45% with diffuse hypokinesis.  There is no evidence or history of ischemic heart disease in the past.  He has had a cardiac catheterization without obstructive coronary disease seen.  He is never had previous myocardial infarctions.  The patient needs a flu vaccine  01/08/2020 The patient returns on referral from his new primary care provider for  evaluation of sleep apnea.  I attempted to make a referral to the sleep clinic in September but no appointment was ever made or kept.  The patient first diagnosed with sleep apnea in 2014.  He had previously done a sleep study at that time which showed significant disease.  He however has not used his CPAP in 5 years as he did not like the effect of the CPAP and it did not interface with him well.  His current machine no longer is operating.  The patient lost weight seem to improve but now has gained weight again and is worse  The patient scores high on the Epworth scale  Patient has significant daytime hypersomnolence snoring and observed apneas at night.  He has decreased memory decreased focus he is referred now back to our clinic to see if we can achieve for him a sleep study and CPAP therapy  04/29/2020 Patient is seen return follow-up for chronic systolic heart failure, morbid obesity, severe obstructive sleep apnea At the last visit we attempted to obtain for the patient a sleep study this was obtained and it did show severe obstructive sleep apnea and cardiology who read the study accidentally tried to order the CPAP and then realized that I was referring provider unfortunate I was not notified of this and there is been a delay in getting the patient his CPAP equipment  Based on the readings of the CPAP recommendations he would need at least 10 to 15 cm water  pressure and a full facemask  The patient maintains on hydralazine 50 mg 3 times daily he is now on lisinopril 40 mg a day because he cannot afford the Entresto also he has not been receiving his for cheek as well.  Note as well he needs refills on vitamin D  Overall there are no change in the patient's symptoms  Wt Readings from Last 3 Encounters:  04/29/20 (!) 437 lb (198.2 kg)  02/25/20 (!) 427 lb (193.7 kg)  01/08/20 (!) 428 lb 6.4 oz (194.3 kg)    Past Medical History:  Diagnosis Date  . Arthritis   . Asthma   . Back  pain   . CHF (congestive heart failure) ([place]) 2008   EF 40-50%        . Fatty liver   . Hypertension   . Leg edema   . Obesity, morbid ([place])   . Obstructive sleep apnea   . Prediabetes      Family History  Problem Relation Age of Onset  . Hypertension Mother   . Obesity Mother   . Hypertension Father      Social History   Socioeconomic History  . Marital status: Single    Spouse name: Not on file  . Number of children: Not on file  . Years of education: Not on file  . Highest education level: Not on file  Occupational History  . Occupation: Orthoptist  Tobacco Use  . Smoking status: Former Smoker    Types: Cigarettes    Quit date: 10/04/2006    Years since quitting: 13.5  . Smokeless tobacco: Never Used  Substance and Sexual Activity  . Alcohol use: No  . Drug use: No  . Sexual activity: Not on file  Other Topics Concern  . 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    Social Determinants of Health   Financial Resource Strain:   . Difficulty of Paying Living Expenses:   Food Insecurity:   . Worried About Charity fundraiser in the Last Year:   . Arboriculturist in the Last Year:   Transportation Needs:   . Film/video editor (Medical):   Marland Kitchen Lack of Transportation (Non-Medical):   Physical Activity:   . Days of Exercise per Week:   . Minutes of Exercise per Session:   Stress:   . Feeling of Stress :   Social Connections:   . Frequency of Communication with Friends and Family:   . Frequency of Social Gatherings with Friends and Family:   . Attends Religious Services:   . Active Member of Clubs or Organizations:   . Attends Archivist Meetings:   Marland Kitchen Marital Status:   Intimate Partner Violence:   . Fear of Current or Ex-Partner:   . Emotionally Abused:   Marland Kitchen Physically Abused:   . Sexually Abused:      No Known Allergies   Outpatient Medications Prior to Visit  Medication Sig Dispense Refill   . amLODipine (NORVASC) 10 MG tablet Take 1 tablet (10 mg total) by mouth daily. 90 tablet 0  . aspirin EC 81 MG tablet Take 81 mg by mouth daily.    Marland Kitchen atorvastatin (LIPITOR) 20 MG tablet Take 1 tablet (20 mg total) by mouth daily. 90 tablet 3  . carvedilol (COREG) 25 MG tablet Take 1 tablet (25 mg total) by mouth 2 (two) times daily. 180 tablet 0  . fluticasone (FLONASE) 50 MCG/ACT  nasal spray Place 2 sprays into both nostrils daily. 16 g 6  . spironolactone (ALDACTONE) 25 MG tablet Take 1 tablet (25 mg total) by mouth daily. 90 tablet 1  . lisinopril (ZESTRIL) 40 MG tablet Take 1 tablet (40 mg total) by mouth daily. 90 tablet 0  . hydrALAZINE (APRESOLINE) 25 MG tablet Take 2 tablets (50 mg total) by mouth 3 (three) times daily. 180 tablet 2  . Vitamin D, Ergocalciferol, (DRISDOL) 1.25 MG (50000 UNIT) CAPS capsule Take 1 capsule (50,000 Units total) by mouth every 7 (seven) days. (Patient not taking: Reported on 04/29/2020) 12 capsule 0  . ENTRESTO 49-51 MG Take 1 tablet by mouth 2 (two) times daily. (Patient not taking: Reported on 04/29/2020)    . FARXIGA 10 MG TABS tablet Take 10 mg by mouth daily. (Patient not taking: Reported on 04/29/2020)     No facility-administered medications prior to visit.    Review of Systems Constitutional:   No  weight loss, night sweats,  Fevers, chills, fatigue, lassitude. HEENT:   No headaches,  Difficulty swallowing,  Tooth/dental problems,  Sore throat,                No sneezing, itching, ear ache, nasal congestion, post nasal drip,   CV:  No chest pain,  Orthopnea, PND, swelling in lower extremities, anasarca, dizziness, palpitations  GI  No heartburn, indigestion, abdominal pain, nausea, vomiting, diarrhea, change in bowel habits, loss of appetite  Resp: No shortness of breath with exertion or at rest.  No excess mucus, no productive cough,  No non-productive cough,  No coughing up of blood.  No change in color of mucus.  No wheezing.  No chest wall  deformity  Skin: no rash or lesions.  GU: no dysuria, change in color of urine, no urgency or frequency.  No flank pain.  MS:  No joint pain or swelling.  No decreased range of motion.  No back pain.  Psych:  No change in mood or affect. No depression or anxiety.  No memory loss.     Objective:   Physical Exam Vitals:   04/29/20 1100  BP: (!) 148/90  Pulse: 83  Resp: 18  Temp: 98.3 F (36.8 C)  SpO2: 96%  Weight: (!) 437 lb (198.2 kg)  Height: 6' (1.829 m)    Gen: Pleasant, morbidly obese, in no distress,  normal affect  ENT: No lesions,  mouth clear,  oropharynx clear, no postnasal drip  Neck: No JVD, no TMG, no carotid bruits  Lungs: No use of accessory muscles, no dullness to percussion, clear without rales or rhonchi  Cardiovascular: RRR, heart sounds normal, no murmur or gallops, no peripheral edema  Abdomen: soft and NT, no HSM,  BS normal  Musculoskeletal: No deformities, no cyanosis or clubbing  Neuro: alert, non focal  Skin: Warm, no lesions or rashes  Previous labs from January 2020 were reviewed  echo Study Conclusions   4/16 - Left ventricle: The cavity size was moderately dilated. Wall   thickness was normal. Systolic function was mildly to moderately   reduced. The estimated ejection fraction was in the range of 40%   to 45%. Diffuse hypokinesis. Akinesis of the basalinferior   myocardium. - Left atrium: The atrium was mildly dilated.  Results of the Epworth flowsheet 01/08/2020 11/21/2013  Sitting and reading 2 3  Watching TV 3 3  Sitting, inactive in a public place (e.g. a theatre or a meeting) 0 2  As a passenger in  a car for an hour without a break 2 3  Lying down to rest in the afternoon when circumstances permit 3 3  Sitting and talking to someone 0 0  Sitting quietly after a lunch without alcohol 3 2  In a car, while stopped for a few minutes in traffic 0 1  Total score 13 17        Assessment & Plan:  I personally reviewed all  images and lab data in the [facility] system as well as any outside material available during this office visit and agree with the  radiology impressions.   Chronic systolic heart failure I contacted our pharmacist and we can obtain for this patient the Entresto we will discontinue lisinopril and begin the [facility] as prescribed at a dose of 49/51 1 twice daily  Obstructive sleep apnea We will plan to obtain for the patient a full face large mask with CPAP 10 to 15 cm water pressure and bring the patient back in follow-up we will order this through his [employer]  Vitamin D deficiency Documented prior history of vitamin D deficiency the patient needs refills on this he will receive 50,000 units weekly and I did send a refill in for him at this visit   Diagnoses and all orders for this visit:  Obstructive sleep apnea -     For home use only DME continuous positive airway pressure (CPAP)  Morbid obesity (HCC)  Chronic systolic heart failure (HCC)  HYPERTENSION, BENIGN ESSENTIAL  Vitamin D deficiency -     VITAMIN D 25 Hydroxy (Vit-D Deficiency, Fractures)  Other orders -     ENTRESTO 49-51 MG; Take 1 tablet by mouth 2 (two) times daily. -     FARXIGA 10 MG TABS tablet; Take 1 tablet (10 mg total) by mouth daily.

## 2020-04-29 NOTE — Assessment & Plan Note (Signed)
We will plan to obtain for the patient a full face large mask with CPAP 10 to 15 cm water pressure and bring the patient back in follow-up we will order this through his [employer]

## 2020-04-29 NOTE — Patient Instructions (Signed)
A cpap machine will be obtained  We will try to get you patient assistance on your Sherryll Burger and Farxiga medications   A Vitamin D level was obtained  Return Dr Delford Field 2 months    Sleep Apnea Sleep apnea affects breathing during sleep. It causes breathing to stop for a short time or to become shallow. It can also increase the risk of:  Heart attack.  Stroke.  Being very overweight (obese).  Diabetes.  Heart failure.  Irregular heartbeat. The goal of treatment is to help you breathe normally again. What are the causes? There are three kinds of sleep apnea:  Obstructive sleep apnea. This is caused by a blocked or collapsed airway.  Central sleep apnea. This happens when the brain does not send the right signals to the muscles that control breathing.  Mixed sleep apnea. This is a combination of obstructive and central sleep apnea. The most common cause of this condition is a collapsed or blocked airway. This can happen if:  Your throat muscles are too relaxed.  Your tongue and tonsils are too large.  You are overweight.  Your airway is too small. What increases the risk?  Being overweight.  Smoking.  Having a small airway.  Being older.  Being male.  Drinking alcohol.  Taking medicines to calm yourself (sedatives or tranquilizers).  Having family members with the condition. What are the signs or symptoms?  Trouble staying asleep.  Being sleepy or tired during the day.  Getting angry a lot.  Loud snoring.  Headaches in the morning.  Not being able to focus your mind (concentrate).  Forgetting things.  Less interest in sex.  Mood swings.  Personality changes.  Feelings of sadness (depression).  Waking up a lot during the night to pee (urinate).  Dry mouth.  Sore throat. How is this diagnosed?  Your medical history.  A physical exam.  A test that is done when you are sleeping (sleep study). The test is most often done in a sleep  lab but may also be done at home. How is this treated?   Sleeping on your side.  Using a medicine to get rid of mucus in your nose (decongestant).  Avoiding the use of alcohol, medicines to help you relax, or certain pain medicines (narcotics).  Losing weight, if needed.  Changing your diet.  Not smoking.  Using a machine to open your airway while you sleep, such as: ? An oral appliance. This is a mouthpiece that shifts your lower jaw forward. ? A CPAP device. This device blows air through a mask when you breathe out (exhale). ? An EPAP device. This has valves that you put in each nostril. ? A BPAP device. This device blows air through a mask when you breathe in (inhale) and breathe out.  Having surgery if other treatments do not work. It is important to get treatment for sleep apnea. Without treatment, it can lead to:  High blood pressure.  Coronary artery disease.  In men, not being able to have an erection (impotence).  Reduced thinking ability. Follow these instructions at home: Lifestyle  Make changes that your doctor recommends.  Eat a healthy diet.  Lose weight if needed.  Avoid alcohol, medicines to help you relax, and some pain medicines.  Do not use any products that contain nicotine or tobacco, such as cigarettes, e-cigarettes, and chewing tobacco. If you need help quitting, ask your doctor. General instructions  Take over-the-counter and prescription medicines only as told by your  doctor.  If you were given a machine to use while you sleep, use it only as told by your doctor.  If you are having surgery, make sure to tell your doctor you have sleep apnea. You may need to bring your device with you.  Keep all follow-up visits as told by your doctor. This is important. Contact a doctor if:  The machine that you were given to use during sleep bothers you or does not seem to be working.  You do not get better.  You get worse. Get help right away  if:  Your chest hurts.  You have trouble breathing in enough air.  You have an uncomfortable feeling in your back, arms, or stomach.  You have trouble talking.  One side of your body feels weak.  A part of your face is hanging down. These symptoms may be an emergency. Do not wait to see if the symptoms will go away. Get medical help right away. Call your local emergency services (911 in the [place] Do not drive yourself to the hospital. Summary  This condition affects breathing during sleep.  The most common cause is a collapsed or blocked airway.  The goal of treatment is to help you breathe normally while you sleep. This information is not intended to replace advice given to you by your health care provider. Make sure you discuss any questions you have with your health care provider. Document Revised: 07/07/2018 Document Reviewed: 05/16/2018 Elsevier Patient Education  2020 [employer].

## 2020-04-30 ENCOUNTER — Other Ambulatory Visit: Payer: Self-pay | Admitting: Critical Care Medicine

## 2020-04-30 DIAGNOSIS — E559 Vitamin D deficiency, unspecified: Secondary | ICD-10-CM

## 2020-04-30 LAB — VITAMIN D 25 HYDROXY (VIT D DEFICIENCY, FRACTURES): Vit D, 25-Hydroxy: 21.1 ng/mL — ABNORMAL LOW (ref 30.0–100.0)

## 2020-04-30 MED ORDER — VITAMIN D (ERGOCALCIFEROL) 1.25 MG (50000 UNIT) PO CAPS: 50000.0000 [IU] | ORAL_CAPSULE | ORAL | 6 refills | Status: DC

## 2020-05-08 ENCOUNTER — Telehealth: Payer: Self-pay

## 2020-05-08 NOTE — Telephone Encounter (Signed)
Call placed to [facility] regarding status of CPAP order.  Spoke to [place] who said that they have sent the patient paperwork that he needs to sign and return prior to receiving the CPAP.  He has already paid the out of pocket cost.

## 2020-05-22 ENCOUNTER — Encounter (HOSPITAL_COMMUNITY): Payer: Self-pay | Admitting: Emergency Medicine

## 2020-05-22 ENCOUNTER — Ambulatory Visit (HOSPITAL_COMMUNITY)
Admission: EM | Admit: 2020-05-22 | Discharge: 2020-05-22 | Disposition: A | Discharge status: Home / Self Care | Payer: BC Managed Care – PPO | Attending: Physician Assistant | Admitting: Physician Assistant

## 2020-05-22 ENCOUNTER — Other Ambulatory Visit: Payer: Self-pay

## 2020-05-22 DIAGNOSIS — M25572 Pain in left ankle and joints of left foot: Secondary | ICD-10-CM

## 2020-05-22 DIAGNOSIS — M19072 Primary osteoarthritis, left ankle and foot: Secondary | ICD-10-CM | POA: Diagnosis not present

## 2020-05-22 MED ORDER — ACETAMINOPHEN 500 MG PO TABS
500.0000 mg | ORAL_TABLET | Freq: Four times a day (QID) | ORAL | 0 refills | Status: DC | PRN
Start: 2020-05-22 — End: 2020-09-04

## 2020-05-22 MED ORDER — PREDNISONE 10 MG PO TABS: 40.0000 mg | ORAL_TABLET | Freq: Every day | ORAL | 0 refills | Status: AC

## 2020-05-22 NOTE — ED Triage Notes (Signed)
Pt presents to [facility] for assessment of left ankle and foot pain starting Sunday.  Patient denies specific injury.  C/o some swelling, denies fever.  Pt is wearing an orthopedic device and using crutches art time of triage.

## 2020-05-22 NOTE — ED Provider Notes (Signed)
[facility]    CSN: [id number] Arrival date & time: 05/22/20  2778      History   Chief Complaint Chief Complaint  Patient presents with  . Foot Pain  . Ankle Pain    HPI Leonard Lewis is a 39 y.o. male.   Patient presents with left ankle and foot pain.  This is been gradually increasing since Sunday which was for 5 days ago.  Reports is similar to his previous episode last year.  He has been wearing his cam boot and using a crutch and taking ibuprofen with some relief.  Reports a little bit of swelling around the ankle and foot.  Denies fever or chills.  Denies much redness.  No injuries, falls or trips.  Has not seen a specialist for this.  No history of gout.  No traumatic injuries in the past.     Past Medical History:  Diagnosis Date  . Arthritis   . Asthma   . Back pain   . CHF (congestive heart failure) (HCC) 2008   EF 40-50%        . Fatty liver   . Hypertension   . Leg edema   . Obesity, morbid (HCC)   . Obstructive sleep apnea   . Prediabetes     Patient Active Problem List   Diagnosis Date Noted  . Tonsillar hypertrophy 01/24/2020  . Vitamin D deficiency 06/19/2019  . Prediabetes 01/03/2018  . 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 Chronic systolic heart failure (HCC) 10/30/2013  . Obstructive sleep apnea 05/31/2007  . HYPERTENSION, BENIGN ESSENTIAL 05/02/2007  . HLD (hyperlipidemia) 03/17/2007  . Morbid obesity (HCC) 03/17/2007    Past Surgical History:  Procedure Laterality Date  . CARDIAC CATHETERIZATION  Oct 2013   cardiac cath negative for obstructive disease -- did show end diastolic pressure secondary to systemic hypertension  . LAPAROSCOPIC GASTRIC SLEEVE RESECTION N/A 01/27/2015   Procedure: LAPAROSCOPIC GASTRIC SLEEVE RESECTION WITH UPPER ENDOSCOPY;  Surgeon: Luretha Murphy, MD;  Location: [facility];  Service: General;  Laterality: N/A;  . LEFT HEART CATHETERIZATION WITH CORONARY ANGIOGRAM N/A 08/03/2012    Procedure: LEFT HEART CATHETERIZATION WITH CORONARY ANGIOGRAM;  Surgeon: Tonny Bollman, MD;  Location: [facility] CATH LAB;  Service: Cardiovascular;  Laterality: N/A;  . NO PAST SURGERIES         Home Medications    Prior to Admission medications   Medication Sig Start Date End Date Taking? Authorizing Provider  acetaminophen (TYLENOL) 500 MG tablet Take 1 tablet (500 mg total) by mouth every 6 (six) hours as needed. 05/22/20   Sharran Caratachea, Veryl Speak, PA-C  amLODipine (NORVASC) 10 MG tablet Take 1 tablet (10 mg total) by mouth daily. 03/14/20   Arvilla Market, DO  aspirin EC 81 MG tablet Take 81 mg by mouth daily.    [provider]  atorvastatin (LIPITOR) 20 MG tablet Take 1 tablet (20 mg total) by mouth daily. 06/20/19   Storm Frisk, MD  carvedilol (COREG) 25 MG tablet Take 1 tablet (25 mg total) by mouth 2 (two) times daily. 03/14/20   Arvilla Market, DO  ENTRESTO 49-51 MG Take 1 tablet by mouth 2 (two) times daily. 04/29/20   Storm Frisk, MD  FARXIGA 10 MG TABS tablet Take 1 tablet (10 mg total) by mouth daily. 04/29/20   Storm Frisk, MD  fluticasone (FLONASE) 50 MCG/ACT nasal spray Place 2 sprays into both nostrils daily. 12/26/19   Marcy Siren  Lauren, DO  hydrALAZINE (APRESOLINE) 25 MG tablet Take 2 tablets (50 mg total) by mouth 3 (three) times daily. 01/08/20 04/07/20  Storm Frisk, MD  predniSONE (DELTASONE) 10 MG tablet Take 4 tablets (40 mg total) by mouth daily with breakfast for 5 days. 05/22/20 05/27/20  Kaja Jackowski, Veryl Speak, PA-C  spironolactone (ALDACTONE) 25 MG tablet Take 1 tablet (25 mg total) by mouth daily. 06/19/19   Storm Frisk, MD  Vitamin D, Ergocalciferol, (DRISDOL) 1.25 MG (50000 UNIT) CAPS capsule Take 1 capsule (50,000 Units total) by mouth every 7 (seven) days. 04/30/20   Storm Frisk, MD    Family History Family History  Problem Relation Age of Onset  . Hypertension Mother   . Obesity Mother   . Hypertension Father      Social History Social History   Tobacco Use  . Smoking status: Former Smoker    Types: Cigarettes    Quit date: 10/04/2006    Years since quitting: 13.6  . Smokeless tobacco: Never Used  Substance Use Topics  . Alcohol use: No  . Drug use: No     Allergies   Patient has no known allergies.   Review of Systems Review of Systems   Physical Exam Triage Vital Signs ED Triage Vitals  Enc Vitals Group     BP 05/22/20 1024 (!) 159/108     Pulse Rate 05/22/20 1024 89     Resp 05/22/20 1024 20     Temp 05/22/20 1024 97.8 F (36.6 C)     Temp Source 05/22/20 1024 Oral     SpO2 05/22/20 1024 99 %     Weight --      Height --      Head Circumference --      Peak Flow --      Pain Score 05/22/20 1022 8     Pain Loc --      Pain Edu? --      Excl. in GC? --    No data found.  Updated Vital Signs BP (!) 159/108 (BP Location: Right Wrist)   Pulse 89   Temp 97.8 F (36.6 C) (Oral)   Resp 20   SpO2 99%   Visual Acuity Right Eye Distance:   Left Eye Distance:   Bilateral Distance:    Right Eye Near:   Left Eye Near:    Bilateral Near:     Physical Exam Vitals and nursing note reviewed.  Constitutional:      General: He is not in acute distress.    Appearance: He is obese. He is not ill-appearing.  Cardiovascular:     Rate and Rhythm: Normal rate.  Pulmonary:     Effort: Pulmonary effort is normal. No respiratory distress.  Musculoskeletal:     Comments: Left ankle with mild swelling into the dorsum of the foot.  No erythema.  Not hot.  Good distal pulses.  There is mild tenderness across the dorsum of the proximal foot.  No malleoli or tenderness.  Patient has full range of motion.  Able to bear weight just with some pain.  Neurological:     Mental Status: He is alert.      UC Treatments / Results  Labs (all labs ordered are listed, but only abnormal results are displayed) Labs Reviewed - No data to display  EKG   Radiology No results  found.  Procedures Procedures (including critical care time)  Medications Ordered in UC Medications - No data to display  Initial Impression / Assessment and Plan / UC Course  I have reviewed the triage vital signs and the nursing notes.  Pertinent labs & imaging results that were available during my care of the patient were reviewed by me and considered in my medical decision making (see chart for details).     # acute left ankle pain #Arthritis left foot Patient is a 39 year old with acute on chronic left ankle and foot pains.  X-ray from 06/2019 showed arthritic changes in the foot and ankle.  Most likely this is inflammatory related to arthritis.  Doubt septic or gout at this point.  Will do short course of prednisone with Tylenol and recommend continued use of Cam boot and crutches.  We will have him follow-up closely with sports medicine group.  Strict return and follow-up precautions discussed. Final Clinical Impressions(s) / UC Diagnoses   Final diagnoses:  Acute left ankle pain  Arthritis of left foot     Discharge Instructions     Take the prednisone as prescribed Take Tylenol in addition to the prednisone Stop taking ibuprofen while taking the prednisone, you may resume this once you have completed the prednisone.  Call the sports medicine group today to have follow-up in the next several days or early next week.  Continue to wear the cam boot and use crutches as needed      ED Prescriptions    Medication Sig Dispense Auth. Provider   predniSONE (DELTASONE) 10 MG tablet Take 4 tablets (40 mg total) by mouth daily with breakfast for 5 days. 20 tablet Sincerity Cedar, Veryl Speak, PA-C   acetaminophen (TYLENOL) 500 MG tablet Take 1 tablet (500 mg total) by mouth every 6 (six) hours as needed. 30 tablet Lene Mckay, Veryl Speak, PA-C     PDMP not reviewed this encounter.   Hermelinda Medicus, PA-C 05/22/20 1130

## 2020-05-22 NOTE — Discharge Instructions (Signed)
Take the prednisone as prescribed Take Tylenol in addition to the prednisone Stop taking ibuprofen while taking the prednisone, you may resume this once you have completed the prednisone.  Call the sports medicine group today to have follow-up in the next several days or early next week.  Continue to wear the cam boot and use crutches as needed

## 2020-05-23 ENCOUNTER — Encounter: Payer: Self-pay | Admitting: Family Medicine

## 2020-05-23 ENCOUNTER — Ambulatory Visit: Payer: Self-pay

## 2020-05-23 ENCOUNTER — Ambulatory Visit (INDEPENDENT_AMBULATORY_CARE_PROVIDER_SITE_OTHER): Payer: BC Managed Care – PPO | Admitting: Family Medicine

## 2020-05-23 VITALS — BP 146/100 | HR 90 | Ht 72.0 in | Wt >= 6400 oz

## 2020-05-23 DIAGNOSIS — M79672 Pain in left foot: Secondary | ICD-10-CM | POA: Insufficient documentation

## 2020-05-23 NOTE — Patient Instructions (Signed)
Nice to meet you Please continue the prednisone. Let me know if your pain goes completely away or not.  Please continue the boot  Please try ice as needed  I will call with the result from today   Please send me a message in MyChart with any questions or updates.  Please see me back in 4 weeks.   --Dr. Jordan Likes

## 2020-05-23 NOTE — Progress Notes (Signed)
Leonard Lewis - 39 y.o. male MRN [id number]  Date of birth: 1981-03-20  SUBJECTIVE:  Including CC & ROS.  Chief Complaint  Patient presents with  . Foot Pain    left x 05/18/20    Leonard Lewis is a 39 y.o. male that is presenting with left foot pain.  This is occurring over the lateral midfoot.  Has a history of similar pain from last year.  Denies any injury or inciting event.  Was placed in a cam walker and has been taking prednisone with improvement.  Denies history of stress fracture..  Independent review of the left ankle x-ray from 20/20 shows no acute bony abnormality.   Review of Systems See HPI   HISTORY: Past Medical, Surgical, Social, and Family History Reviewed & Updated per EMR.   Pertinent Historical Findings include:  Past Medical History:  Diagnosis Date  . Arthritis   . Asthma   . Back pain   . CHF (congestive heart failure) ([place]) 2008   EF 40-50%        . Fatty liver   . Hypertension   . Leg edema   . Obesity, morbid ([place])   . Obstructive sleep apnea   . Prediabetes     Past Surgical History:  Procedure Laterality Date  . CARDIAC CATHETERIZATION  Oct 2013   cardiac cath negative for obstructive disease -- did show end diastolic pressure secondary to systemic hypertension  . LAPAROSCOPIC GASTRIC SLEEVE RESECTION N/A 01/27/2015   Procedure: LAPAROSCOPIC GASTRIC SLEEVE RESECTION WITH UPPER ENDOSCOPY;  Surgeon: Johnathan Hausen, MD;  Location: [facility];  Service: General;  Laterality: N/A;  . LEFT HEART CATHETERIZATION WITH CORONARY ANGIOGRAM N/A 08/03/2012   Procedure: LEFT HEART CATHETERIZATION WITH CORONARY ANGIOGRAM;  Surgeon: Sherren Mocha, MD;  Location: [facility] CATH LAB;  Service: Cardiovascular;  Laterality: N/A;  . NO PAST SURGERIES      Family History  Problem Relation Age of Onset  . Hypertension Mother   . Obesity Mother   . Hypertension Father     Social History   Socioeconomic History  . Marital status: Single    Spouse name: Not on  file  . Number of children: Not on file  . Years of education: Not on file  . Highest education level: Not on file  Occupational History  . Occupation: Orthoptist  Tobacco Use  . Smoking status: Former Smoker    Types: Cigarettes    Quit date: 10/04/2006    Years since quitting: 13.6  . Smokeless tobacco: Never Used  Substance and Sexual Activity  . Alcohol use: No  . Drug use: No  . Sexual activity: Not on file  Other Topics Concern  . 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    Social Determinants of Health   Financial Resource Strain:   . Difficulty of Paying Living Expenses: Not on file  Food Insecurity:   . Worried About Charity fundraiser in the Last Year: Not on file  . Ran Out of Food in the Last Year: Not on file  Transportation Needs:   . Lack of Transportation (Medical): Not on file  . Lack of Transportation (Non-Medical): Not on file  Physical Activity:   . Days of Exercise per Week: Not on file  . Minutes of Exercise per Session: Not on file  Stress:   . Feeling of Stress : Not on file  Social Connections:   . Frequency of  Communication with Friends and Family: Not on file  . Frequency of Social Gatherings with Friends and Family: Not on file  . Attends Religious Services: Not on file  . Active Member of Clubs or Organizations: Not on file  . Attends Archivist Meetings: Not on file  . Marital Status: Not on file  Intimate Partner Violence:   . Fear of Current or Ex-Partner: Not on file  . Emotionally Abused: Not on file  . Physically Abused: Not on file  . Sexually Abused: Not on file     PHYSICAL EXAM:  VS: BP (!) 146/100   Pulse 90   Ht 6' (1.829 m)   Wt (!) 437 lb (198.2 kg)   BMI 59.27 kg/m  Physical Exam Gen: NAD, alert, cooperative with exam, well-appearing MSK:  Left foot: No obvious ecchymosis or swelling. Normal ankle range of motion. Tender to palpation over the lateral  midfoot. No redness or warmth. Neurovascular intact  Limited ultrasound: Left foot:  Normal-appearing peroneal tendons. No effusion of the ankle joint. Normal-appearing base of the fifth metatarsal. Hyperemia and cortical change at the base of the fourth to suggest either inflammatory process or stress fracture.  Summary: Concerning for stress fracture versus inflammatory process.  Ultrasound and interpretation by Clearance Coots, MD    ASSESSMENT & PLAN:   Left foot pain He has changes at the base of the fourth metatarsal.  No history of injury and no prior stress fractures.  Possible for gouty source as he has had improvement with prednisone with repeated history of similar pain as well as a response with the prednisone. -Counseled on Cam walker and supportive care. -Uric acid. -If no improvement with prednisone will consider MRI to evaluate for stress fracture.

## 2020-05-23 NOTE — Assessment & Plan Note (Signed)
He has changes at the base of the fourth metatarsal.  No history of injury and no prior stress fractures.  Possible for gouty source as he has had improvement with prednisone with repeated history of similar pain as well as a response with the prednisone. -Counseled on Cam walker and supportive care. -Uric acid. -If no improvement with prednisone will consider MRI to evaluate for stress fracture.

## 2020-05-24 LAB — URIC ACID: Uric Acid: 8.2 mg/dL (ref 3.8–8.4)

## 2020-05-26 ENCOUNTER — Telehealth: Payer: Self-pay | Admitting: Family Medicine

## 2020-05-26 NOTE — Telephone Encounter (Signed)
Informed of results.  Would likely transition allopurinol once his pain is improved.  Myra Rude, MD [facility] 05/26/2020, 9:52 AM

## 2020-06-06 DIAGNOSIS — G4733 Obstructive sleep apnea (adult) (pediatric): Secondary | ICD-10-CM | POA: Diagnosis not present

## 2020-06-13 ENCOUNTER — Other Ambulatory Visit: Payer: Self-pay

## 2020-06-13 MED ORDER — ATORVASTATIN CALCIUM 20 MG PO TABS
20.0000 mg | ORAL_TABLET | Freq: Every day | ORAL | 0 refills | Status: DC
Start: 2020-06-13 — End: 2020-11-04

## 2020-06-13 MED ORDER — HYDRALAZINE HCL 25 MG PO TABS
50.0000 mg | ORAL_TABLET | Freq: Three times a day (TID) | ORAL | 0 refills | Status: DC
Start: 2020-06-13 — End: 2020-11-04

## 2020-06-13 MED ORDER — CARVEDILOL 25 MG PO TABS
25.0000 mg | ORAL_TABLET | Freq: Two times a day (BID) | ORAL | 0 refills | Status: DC
Start: 2020-06-13 — End: 2020-11-04

## 2020-06-13 MED ORDER — AMLODIPINE BESYLATE 10 MG PO TABS
10.0000 mg | ORAL_TABLET | Freq: Every day | ORAL | 0 refills | Status: DC
Start: 2020-06-13 — End: 2020-11-04

## 2020-06-13 MED ORDER — SPIRONOLACTONE 25 MG PO TABS
25.0000 mg | ORAL_TABLET | Freq: Every day | ORAL | 0 refills | Status: DC
Start: 2020-06-13 — End: 2020-11-04

## 2020-06-20 ENCOUNTER — Encounter: Payer: Self-pay | Admitting: Family Medicine

## 2020-06-20 ENCOUNTER — Ambulatory Visit (INDEPENDENT_AMBULATORY_CARE_PROVIDER_SITE_OTHER): Payer: BC Managed Care – PPO | Admitting: Family Medicine

## 2020-06-20 ENCOUNTER — Other Ambulatory Visit: Payer: Self-pay

## 2020-06-20 VITALS — BP 133/87 | HR 79 | Ht 72.0 in | Wt >= 6400 oz

## 2020-06-20 DIAGNOSIS — M79672 Pain in left foot: Secondary | ICD-10-CM

## 2020-06-20 DIAGNOSIS — M25572 Pain in left ankle and joints of left foot: Secondary | ICD-10-CM | POA: Diagnosis not present

## 2020-06-20 MED ORDER — PENNSAID 2 % EX SOLN: 1.0000 "application " | Freq: Two times a day (BID) | CUTANEOUS | 2 refills | Status: DC

## 2020-06-20 NOTE — Assessment & Plan Note (Signed)
Likely exacerbated due to the fact that he was wearing a cam walker for period of time.  He has pes planus that is likely attributed his pain. -Counseled on home exercise therapy and supportive care. -Counseled ibuprofen. -Provided Pennsaid. -Provided scaphoid pad. -Could consider injection.

## 2020-06-20 NOTE — Assessment & Plan Note (Signed)
His pain in his midfoot has improved but is likely related to gout. -Counseled supportive care. -Continue allopurinol. -May need to obtain uric acid at follow-up.

## 2020-06-20 NOTE — Patient Instructions (Signed)
Good to see you Please try ice  Please try the ibuprofen for three days  I have sent in a rub on medicine that you can use after the ibuprofen  Please try the exercises   Please send me a message in MyChart with any questions or updates.  Please see me back in 2-3 weeks.   --Dr. Jordan Likes

## 2020-06-20 NOTE — Progress Notes (Signed)
Leonard Lewis - 39 y.o. male MRN [id number]  Date of birth: 1980-12-29  SUBJECTIVE:  Including CC & ROS.  Chief Complaint  Patient presents with  . Follow-up    left foot    Leonard Lewis is a 39 y.o. male that is following up for his left foot pain but is presenting with left ankle pain.  He reports the pain is gone in the midfoot area.  He is now presenting with lateral ankle pain.  Seems to be worse with walking only puts on weight on his ankle.  No injury or inciting event.  No redness or swelling.  Review of Systems See HPI   HISTORY: Past Medical, Surgical, Social, and Family History Reviewed & Updated per EMR.   Pertinent Historical Findings include:  Past Medical History:  Diagnosis Date  . Arthritis   . Asthma   . Back pain   . CHF (congestive heart failure) ([place]) 2008   EF 40-50%        . Fatty liver   . Hypertension   . Leg edema   . Obesity, morbid ([place])   . Obstructive sleep apnea   . Prediabetes     Past Surgical History:  Procedure Laterality Date  . CARDIAC CATHETERIZATION  Oct 2013   cardiac cath negative for obstructive disease -- did show end diastolic pressure secondary to systemic hypertension  . LAPAROSCOPIC GASTRIC SLEEVE RESECTION N/A 01/27/2015   Procedure: LAPAROSCOPIC GASTRIC SLEEVE RESECTION WITH UPPER ENDOSCOPY;  Surgeon: Johnathan Hausen, MD;  Location: [facility];  Service: General;  Laterality: N/A;  . LEFT HEART CATHETERIZATION WITH CORONARY ANGIOGRAM N/A 08/03/2012   Procedure: LEFT HEART CATHETERIZATION WITH CORONARY ANGIOGRAM;  Surgeon: Sherren Mocha, MD;  Location: [facility] CATH LAB;  Service: Cardiovascular;  Laterality: N/A;  . NO PAST SURGERIES      Family History  Problem Relation Age of Onset  . Hypertension Mother   . Obesity Mother   . Hypertension Father     Social History   Socioeconomic History  . Marital status: Single    Spouse name: Not on file  . Number of children: Not on file  . Years of education: Not on file  .  Highest education level: Not on file  Occupational History  . Occupation: Orthoptist  Tobacco Use  . Smoking status: Former Smoker    Types: Cigarettes    Quit date: 10/04/2006    Years since quitting: 13.7  . Smokeless tobacco: Never Used  Substance and Sexual Activity  . Alcohol use: No  . Drug use: No  . Sexual activity: Not on file  Other Topics Concern  . 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    Social Determinants of Health   Financial Resource Strain:   . Difficulty of Paying Living Expenses: Not on file  Food Insecurity:   . Worried About Charity fundraiser in the Last Year: Not on file  . Ran Out of Food in the Last Year: Not on file  Transportation Needs:   . Lack of Transportation (Medical): Not on file  . Lack of Transportation (Non-Medical): Not on file  Physical Activity:   . Days of Exercise per Week: Not on file  . Minutes of Exercise per Session: Not on file  Stress:   . Feeling of Stress : Not on file  Social Connections:   . Frequency of Communication with Friends and Family: Not on file  .  Frequency of Social Gatherings with Friends and Family: Not on file  . Attends Religious Services: Not on file  . Active Member of Clubs or Organizations: Not on file  . Attends Archivist Meetings: Not on file  . Marital Status: Not on file  Intimate Partner Violence:   . Fear of Current or Ex-Partner: Not on file  . Emotionally Abused: Not on file  . Physically Abused: Not on file  . Sexually Abused: Not on file     PHYSICAL EXAM:  VS: BP 133/87   Pulse 79   Ht 6' (1.829 m)   Wt (!) 437 lb (198.2 kg)   BMI 59.27 kg/m  Physical Exam Gen: NAD, alert, cooperative with exam, well-appearing MSK:  Left ankle: Tenderness to palpation over the lateral anterior ankle in the sinus tarsi Normal ankle range of motion. Significant pes planus. Neurovascularly intact     ASSESSMENT & PLAN:   Left  foot pain His pain in his midfoot has improved but is likely related to gout. -Counseled supportive care. -Continue allopurinol. -May need to obtain uric acid at follow-up.  Sinus tarsi syndrome of left ankle Likely exacerbated due to the fact that he was wearing a cam walker for period of time.  He has pes planus that is likely attributed his pain. -Counseled on home exercise therapy and supportive care. -Counseled ibuprofen. -Provided Pennsaid. -Provided scaphoid pad. -Could consider injection.

## 2020-07-04 ENCOUNTER — Ambulatory Visit: Payer: BC Managed Care – PPO | Admitting: Family Medicine

## 2020-07-06 DIAGNOSIS — G4733 Obstructive sleep apnea (adult) (pediatric): Secondary | ICD-10-CM | POA: Diagnosis not present

## 2020-08-04 ENCOUNTER — Emergency Department (HOSPITAL_BASED_OUTPATIENT_CLINIC_OR_DEPARTMENT_OTHER): Payer: BC Managed Care – PPO

## 2020-08-04 ENCOUNTER — Emergency Department (HOSPITAL_BASED_OUTPATIENT_CLINIC_OR_DEPARTMENT_OTHER)
Admission: EM | Admit: 2020-08-04 | Discharge: 2020-08-04 | Disposition: A | Discharge status: Home / Self Care | Payer: BC Managed Care – PPO | Attending: Emergency Medicine | Admitting: Emergency Medicine

## 2020-08-04 ENCOUNTER — Other Ambulatory Visit: Payer: Self-pay

## 2020-08-04 DIAGNOSIS — Z7982 Long term (current) use of aspirin: Secondary | ICD-10-CM | POA: Insufficient documentation

## 2020-08-04 DIAGNOSIS — R079 Chest pain, unspecified: Secondary | ICD-10-CM | POA: Diagnosis not present

## 2020-08-04 DIAGNOSIS — Z79899 Other long term (current) drug therapy: Secondary | ICD-10-CM | POA: Diagnosis not present

## 2020-08-04 DIAGNOSIS — R1013 Epigastric pain: Secondary | ICD-10-CM | POA: Diagnosis not present

## 2020-08-04 DIAGNOSIS — I509 Heart failure, unspecified: Secondary | ICD-10-CM | POA: Diagnosis not present

## 2020-08-04 DIAGNOSIS — Z87891 Personal history of nicotine dependence: Secondary | ICD-10-CM | POA: Insufficient documentation

## 2020-08-04 DIAGNOSIS — J45909 Unspecified asthma, uncomplicated: Secondary | ICD-10-CM | POA: Diagnosis not present

## 2020-08-04 DIAGNOSIS — R0789 Other chest pain: Secondary | ICD-10-CM | POA: Diagnosis not present

## 2020-08-04 DIAGNOSIS — I10 Essential (primary) hypertension: Secondary | ICD-10-CM | POA: Diagnosis not present

## 2020-08-04 DIAGNOSIS — I110 Hypertensive heart disease with heart failure: Secondary | ICD-10-CM | POA: Insufficient documentation

## 2020-08-04 DIAGNOSIS — R109 Unspecified abdominal pain: Secondary | ICD-10-CM | POA: Diagnosis not present

## 2020-08-04 LAB — COMPREHENSIVE METABOLIC PANEL
ALT: 18 U/L (ref 0–44)
AST: 20 U/L (ref 15–41)
Albumin: 3.4 g/dL — ABNORMAL LOW (ref 3.5–5.0)
Alkaline Phosphatase: 43 U/L (ref 38–126)
Anion gap: 11 (ref 5–15)
BUN: 19 mg/dL (ref 6–20)
CO2: 23 mmol/L (ref 22–32)
Calcium: 9.1 mg/dL (ref 8.9–10.3)
Chloride: 103 mmol/L (ref 98–111)
Creatinine, Ser: 1.63 mg/dL — ABNORMAL HIGH (ref 0.61–1.24)
GFR, Estimated: 55 mL/min — ABNORMAL LOW (ref >60)
Glucose, Bld: 151 mg/dL — ABNORMAL HIGH (ref 70–99)
Potassium: 4.1 mmol/L (ref 3.5–5.1)
Sodium: 137 mmol/L (ref 135–145)
Total Bilirubin: 0.4 mg/dL (ref 0.3–1.2)
Total Protein: 8 g/dL (ref 6.5–8.1)

## 2020-08-04 LAB — CBC WITH DIFFERENTIAL/PLATELET
Abs Immature Granulocytes: 0.05 10*3/uL (ref 0.00–0.07)
Basophils Absolute: 0.1 10*3/uL (ref 0.0–0.1)
Basophils Relative: 1 %
Eosinophils Absolute: 0.1 10*3/uL (ref 0.0–0.5)
Eosinophils Relative: 1 %
HCT: 38.3 % — ABNORMAL LOW (ref 39.0–52.0)
Hemoglobin: 11.8 g/dL — ABNORMAL LOW (ref 13.0–17.0)
Immature Granulocytes: 1 %
Lymphocytes Relative: 11 %
Lymphs Abs: 1.2 10*3/uL (ref 0.7–4.0)
MCH: 21.9 pg — ABNORMAL LOW (ref 26.0–34.0)
MCHC: 30.8 g/dL (ref 30.0–36.0)
MCV: 71.1 fL — ABNORMAL LOW (ref 80.0–100.0)
Monocytes Absolute: 0.5 10*3/uL (ref 0.1–1.0)
Monocytes Relative: 5 %
Neutro Abs: 8.8 10*3/uL — ABNORMAL HIGH (ref 1.7–7.7)
Neutrophils Relative %: 81 %
Platelets: 366 10*3/uL (ref 150–400)
RBC: 5.39 MIL/uL (ref 4.22–5.81)
RDW: 15.2 % (ref 11.5–15.5)
WBC: 10.7 10*3/uL — ABNORMAL HIGH (ref 4.0–10.5)
nRBC: 0 % (ref 0.0–0.2)

## 2020-08-04 LAB — TROPONIN I (HIGH SENSITIVITY)
Troponin I (High Sensitivity): 20 ng/L — ABNORMAL HIGH (ref <18)
Troponin I (High Sensitivity): 21 ng/L — ABNORMAL HIGH (ref <18)

## 2020-08-04 LAB — LIPASE, BLOOD: Lipase: 27 U/L (ref 11–51)

## 2020-08-04 MED ORDER — OMEPRAZOLE 20 MG PO CPDR: 20.0000 mg | DELAYED_RELEASE_CAPSULE | Freq: Every day | ORAL | 0 refills | Status: DC

## 2020-08-04 MED ORDER — MORPHINE SULFATE (PF) 4 MG/ML IV SOLN
4.0000 mg | Freq: Once | INTRAVENOUS | Status: AC
  Administered 2020-08-04: 4 mg via INTRAVENOUS
  Filled 2020-08-04: qty 1

## 2020-08-04 MED ORDER — ONDANSETRON HCL 4 MG/2ML IJ SOLN
4.0000 mg | Freq: Once | INTRAMUSCULAR | Status: AC
  Administered 2020-08-04: 4 mg via INTRAVENOUS
  Filled 2020-08-04: qty 2

## 2020-08-04 MED ORDER — ALUM & MAG HYDROXIDE-SIMETH 200-200-20 MG/5ML PO SUSP
30.0000 mL | Freq: Once | ORAL | Status: AC
  Administered 2020-08-04: 30 mL via ORAL
  Filled 2020-08-04: qty 30

## 2020-08-04 NOTE — ED Provider Notes (Signed)
[facility] EMERGENCY DEPARTMENT Provider Note   CSN: [id number] Arrival date & time: 08/04/20  0104     History Chief Complaint  Patient presents with  . Chest Pain    Leonard SimpersGary T Lewis is a 39 y.o. male.  HPI     This is a 39 year old male with a history of systolic heart failure, hypertension, morbid obesity, sleep apnea notes with upper abdominal and lower chest pain.  Onset of symptoms around 8 PM yesterday evening.  He reports he did not eat much for dinner but ate some M&Ms and had onset of pain.  He reports some shortness of breath but no diaphoresis.  Nausea without vomiting.  No change in bowel habits.  He has not had any fevers or upper respiratory symptoms.  Currently he rates his pain at 9 out of 10.  He did not take anything for the pain prior to arrival.  Past Medical History:  Diagnosis Date  . Arthritis   . Asthma   . Back pain   . CHF (congestive heart failure) ([facility]) 2008   EF 40-50%        . Fatty liver   . Hypertension   . Leg edema   . Obesity, morbid ([facility])   . Obstructive sleep apnea   . Prediabetes     Patient Active Problem List   Diagnosis Date Noted  . 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  . Left foot pain 05/23/2020  . Tonsillar hypertrophy 01/24/2020  . Vitamin D deficiency 06/19/2019  . Prediabetes 01/03/2018  . 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 Chronic systolic heart failure ([facility]) 10/30/2013  . Obstructive sleep apnea 05/31/2007  . HYPERTENSION, BENIGN ESSENTIAL 05/02/2007  . HLD (hyperlipidemia) 03/17/2007  . Morbid obesity ([facility]) 03/17/2007    Past Surgical History:  Procedure Laterality Date  . CARDIAC CATHETERIZATION  Oct 2013   cardiac cath negative for obstructive disease -- did show end diastolic pressure secondary to systemic hypertension  . LAPAROSCOPIC GASTRIC SLEEVE RESECTION N/A 01/27/2015   Procedure: LAPAROSCOPIC GASTRIC SLEEVE RESECTION WITH UPPER ENDOSCOPY;  Surgeon:  Luretha MurphyMatthew Martin, MD;  Location: [facility];  Service: General;  Laterality: N/A;  . LEFT HEART CATHETERIZATION WITH CORONARY ANGIOGRAM N/A 08/03/2012   Procedure: LEFT HEART CATHETERIZATION WITH CORONARY ANGIOGRAM;  Surgeon: Tonny BollmanMichael Cooper, MD;  [facility];  Service: Cardiovascular;  Laterality: N/A;  . NO PAST SURGERIES         Family History  Problem Relation Age of Onset  . Hypertension Mother   . Obesity Mother   . Hypertension Father     Social History   Tobacco Use  . Smoking status: Former Smoker    Types: Cigarettes    Quit date: 10/04/2006    Years since quitting: 13.8  . Smokeless tobacco: Never Used  Substance Use Topics  . Alcohol use: No  . Drug use: No    Home Medications Prior to Admission medications   Medication Sig Start Date End Date Taking? Authorizing Provider  acetaminophen (TYLENOL) 500 MG tablet Take 1 tablet (500 mg total) by mouth every 6 (six) hours as needed. 05/22/20   Darr, Gerilyn PilgrimJacob, PA-C  amLODipine (NORVASC) 10 MG tablet Take 1 tablet (10 mg total) by mouth daily. 06/13/20   Arvilla MarketWallace, Catherine Lauren, DO  aspirin EC 81 MG tablet Take 81 mg by mouth daily.    [provider]  atorvastatin (LIPITOR) 20 MG tablet Take 1 tablet (20 mg total) by mouth  daily. 06/13/20   Arvilla Market, DO  carvedilol (COREG) 25 MG tablet Take 1 tablet (25 mg total) by mouth 2 (two) times daily. 06/13/20   Arvilla Market, DO  Diclofenac Sodium (PENNSAID) 2 % SOLN Place 1 application onto the skin 2 (two) times daily. 06/20/20   Myra Rude, MD  ENTRESTO 49-51 MG Take 1 tablet by mouth 2 (two) times daily. 04/29/20   Storm Frisk, MD  FARXIGA 10 MG TABS tablet Take 1 tablet (10 mg total) by mouth daily. 04/29/20   Storm Frisk, MD  fluticasone (FLONASE) 50 MCG/ACT nasal spray Place 2 sprays into both nostrils daily. 12/26/19   Arvilla Market, DO  hydrALAZINE (APRESOLINE) 25 MG tablet Take 2 tablets (50 mg total) by  mouth 3 (three) times daily. 06/13/20   Arvilla Market, DO  omeprazole (PRILOSEC) 20 MG capsule Take 1 capsule (20 mg total) by mouth daily. 08/04/20   Areil Ottey, Mayer Masker, MD  spironolactone (ALDACTONE) 25 MG tablet Take 1 tablet (25 mg total) by mouth daily. 06/13/20   Arvilla Market, DO  Vitamin D, Ergocalciferol, (DRISDOL) 1.25 MG (50000 UNIT) CAPS capsule Take 1 capsule (50,000 Units total) by mouth every 7 (seven) days. 04/30/20   Storm Frisk, MD    Allergies    Patient has no known allergies.  Review of Systems   Review of Systems  Constitutional: Negative for fever.  Respiratory: Positive for shortness of breath. Negative for cough.   Cardiovascular: Positive for chest pain. Negative for leg swelling.  Gastrointestinal: Positive for nausea. Negative for abdominal pain, diarrhea and vomiting.  Genitourinary: Negative for dysuria.  All other systems reviewed and are negative.   Physical Exam Updated Vital Signs BP (!) 160/80   Pulse 77   Temp 98 F (36.7 C) (Oral)   Resp 19   Ht 1.829 m (6')   Wt (!) 203.2 kg   SpO2 98%   BMI 60.76 kg/m   Physical Exam Vitals and nursing note reviewed.  Constitutional:      Appearance: He is well-developed.     Comments: Morbidly obese, nontoxic-appearing, no acute distress  HENT:     Head: Normocephalic and atraumatic.  Eyes:     Pupils: Pupils are equal, round, and reactive to light.  Cardiovascular:     Rate and Rhythm: Normal rate and regular rhythm.     Heart sounds: Normal heart sounds. No murmur heard.   Pulmonary:     Effort: Pulmonary effort is normal. No respiratory distress.     Breath sounds: Normal breath sounds. No wheezing.     Comments: Limited secondary to body habitus Abdominal:     General: Bowel sounds are normal.     Palpations: Abdomen is soft.     Tenderness: There is abdominal tenderness. There is no rebound.     Comments: Epigastric tenderness palpation, no rebound or guarding,  negative Murphy sign  Musculoskeletal:     Cervical back: Neck supple.     Comments: Trace bilateral lower extremity edema  Lymphadenopathy:     Cervical: No cervical adenopathy.  Skin:    General: Skin is warm and dry.  Neurological:     General: No focal deficit present.     Mental Status: He is alert and oriented to person, place, and time.  Psychiatric:        Mood and Affect: Mood normal.     ED Results / Procedures / Treatments   Labs (all  labs ordered are listed, but only abnormal results are displayed) Labs Reviewed  CBC WITH DIFFERENTIAL/PLATELET - Abnormal; Notable for the following components:      Result Value   WBC 10.7 (*)    Hemoglobin 11.8 (*)    HCT 38.3 (*)    MCV 71.1 (*)    MCH 21.9 (*)    Neutro Abs 8.8 (*)    All other components within normal limits  COMPREHENSIVE METABOLIC PANEL - Abnormal; Notable for the following components:   Glucose, Bld 151 (*)    Creatinine, Ser 1.63 (*)    Albumin 3.4 (*)    GFR, Estimated 55 (*)    All other components within normal limits  TROPONIN I (HIGH SENSITIVITY) - Abnormal; Notable for the following components:   Troponin I (High Sensitivity) 20 (*)    All other components within normal limits  TROPONIN I (HIGH SENSITIVITY) - Abnormal; Notable for the following components:   Troponin I (High Sensitivity) 21 (*)    All other components within normal limits  LIPASE, BLOOD    EKG EKG Interpretation  Date/Time:  Monday August 04 2020 01:14:43 EDT Ventricular Rate:  82 PR Interval:    QRS Duration: 106 QT Interval:  408 QTC Calculation: 477 R Axis:   -17 Text Interpretation: Sinus rhythm Probable left atrial enlargement Borderline left axis deviation Low voltage, precordial leads RSR' in V1 or V2, right VCD or RVH Borderline prolonged QT interval Confirmed by Ross Marcus ([id number]) on 08/04/2020 2:43:26 AM   Radiology CT ABDOMEN PELVIS WO CONTRAST  Result Date: 08/04/2020 CLINICAL DATA:  Abdominal  pain. EXAM: CT ABDOMEN AND PELVIS WITHOUT CONTRAST TECHNIQUE: Multidetector CT imaging of the abdomen and pelvis was performed following the standard protocol without IV contrast. COMPARISON:  None. FINDINGS: Lower chest: No acute abnormality. Hepatobiliary: No focal liver abnormality is seen. No gallstones, gallbladder wall thickening, or biliary dilatation. Pancreas: Unremarkable. No pancreatic ductal dilatation or surrounding inflammatory changes. Spleen: Normal in size without focal abnormality. Adrenals/Urinary Tract: Adrenal glands are unremarkable. Kidneys are normal, without renal calculi, focal lesion, or hydronephrosis. Bladder is unremarkable. Stomach/Bowel: Surgical sutures are seen within the gastric region. Appendix appears normal. No evidence of bowel wall thickening, distention, or inflammatory changes. Vascular/Lymphatic: No significant vascular findings are present. No enlarged abdominal or pelvic lymph nodes. Reproductive: Prostate is unremarkable. Other: No abdominal wall hernia or abnormality. No abdominopelvic ascites. Musculoskeletal: No acute or significant osseous findings. IMPRESSION: 1. Evidence of prior gastric surgery. 2. No evidence of acute or active process within the abdomen or pelvis. Electronically Signed   By: Aram Candela M.D.   On: 08/04/2020 03:14   DG Chest 2 View  Result Date: 08/04/2020 CLINICAL DATA:  Chest pain EXAM: CHEST - 2 VIEW COMPARISON:  01/13/2015 FINDINGS: The heart size and mediastinal contours are within normal limits. Both lungs are clear. The visualized skeletal structures are unremarkable. IMPRESSION: No active cardiopulmonary disease. Electronically Signed   By: Helyn Numbers MD   On: 08/04/2020 01:52    Procedures Procedures (including critical care time)  Medications Ordered in ED Medications  morphine 4 MG/ML injection 4 mg (4 mg Intravenous Given 08/04/20 0156)  ondansetron (ZOFRAN) injection 4 mg (4 mg Intravenous Given 08/04/20 0156)   morphine 4 MG/ML injection 4 mg (4 mg Intravenous Given 08/04/20 0420)  alum & mag hydroxide-simeth (MAALOX/MYLANTA) 200-200-20 MG/5ML suspension 30 mL (30 mLs Oral Given 08/04/20 0502)    ED Course  I have reviewed the triage vital signs  and the nursing notes.  Pertinent labs & imaging results that were available during my care of the patient were reviewed by me and considered in my medical decision making (see chart for details).    MDM Rules/Calculators/A&P                          This is a very pleasant 39 year old male who presents with upper abdominal and chest pain.  He is nontoxic-appearing and vital signs are notable for blood pressure 160/80.  He really has some reproducible epigastric tenderness.  His history is most suggestive of possible GI etiology although ACS is certainly consideration given his risk factors.  EKG shows no acute ischemic changes and chest x-ray shows no evidence of pneumothorax or pneumonia to explain his symptoms.  Patient was given pain and nausea medication.  Do not see any ischemic evaluations in his chart.  Initial work-up notable for creatinine of 1.63 which is slightly elevated.  He also has a slight leukocytosis.  Troponin is 20.  However, no prior for comparison.  Given his renal function, this could be his baseline.  Will repeat at 2 hours.  Patient was redosed with pain medication and a GI cocktail.  CT scan of the abdomen pelvis was obtained and is negative for acute process including gallstones or pancreatic inflammation.  Repeat troponin is stable at 21.  I am highly suspicious that this is more GI related than it is ACS.  I discussed this with the patient.  He had improvement with a GI cocktail.  Will start on omeprazole daily and have him follow-up with his primary physician and cardiology.  After history, exam, and medical workup I feel the patient has been appropriately medically screened and is safe for discharge home. Pertinent diagnoses were  discussed with the patient. Patient was given return precautions.  Final Clinical Impression(s) / ED Diagnoses Final diagnoses:  Epigastric pain  Atypical chest pain    Rx / DC Orders ED Discharge Orders         Ordered    omeprazole (PRILOSEC) 20 MG capsule  Daily        08/04/20 0544           Shon Baton, MD 08/04/20 [phone]

## 2020-08-04 NOTE — Discharge Instructions (Addendum)
You were seen today for upper abdominal and chest pain.  Your symptoms are not consistent with a heart attack; however, given your risk factors, you should follow-up with your primary physician and cardiology for further testing.  Given that your symptoms suggest a GI origin, start omeprazole daily to see if this helps.

## 2020-08-04 NOTE — ED Triage Notes (Signed)
Reports central cp that started around 8pm.  Also endorses some SOB.

## 2020-08-04 NOTE — ED Notes (Signed)
Pt c/o anterior chest and upper abd pain. Rates 7/10. Dr. Wilkie Aye aware and orders received.

## 2020-08-06 DIAGNOSIS — G4733 Obstructive sleep apnea (adult) (pediatric): Secondary | ICD-10-CM | POA: Diagnosis not present

## 2020-09-04 ENCOUNTER — Telehealth: Payer: BC Managed Care – PPO

## 2020-09-04 NOTE — Progress Notes (Deleted)
Patient ID: Leonard Lewis, male   DOB: [birth year], 39 y.o.   MRN: [id number]   After being seen in ED 11/01 with upper abdominal pain and chest pain.    From ED A/P: This is a very pleasant 39 year old male who presents with upper abdominal and chest pain.  He is nontoxic-appearing and vital signs are notable for blood pressure 160/80.  He really has some reproducible epigastric tenderness.  His history is most suggestive of possible GI etiology although ACS is certainly consideration given his risk factors.  EKG shows no acute ischemic changes and chest x-ray shows no evidence of pneumothorax or pneumonia to explain his symptoms.  Patient was given pain and nausea medication.  Do not see any ischemic evaluations in his chart.  Initial work-up notable for creatinine of 1.63 which is slightly elevated.  He also has a slight leukocytosis.  Troponin is 20.  However, no prior for comparison.  Given his renal function, this could be his baseline.  Will repeat at 2 hours.  Patient was redosed with pain medication and a GI cocktail.  CT scan of the abdomen pelvis was obtained and is negative for acute process including gallstones or pancreatic inflammation.  Repeat troponin is stable at 21.  I am highly suspicious that this is more GI related than it is ACS.  I discussed this with the patient.  He had improvement with a GI cocktail.  Will start on omeprazole daily and have him follow-up with his primary physician and cardiology.  After history, exam, and medical workup I feel the patient has been appropriately medically screened and is safe for discharge home. Pertinent diagnoses were discussed with the patient. Patient was given return precautions.

## 2020-09-05 DIAGNOSIS — G4733 Obstructive sleep apnea (adult) (pediatric): Secondary | ICD-10-CM | POA: Diagnosis not present

## 2020-09-23 ENCOUNTER — Other Ambulatory Visit: Payer: Self-pay | Admitting: Internal Medicine

## 2020-10-06 DIAGNOSIS — G4733 Obstructive sleep apnea (adult) (pediatric): Secondary | ICD-10-CM | POA: Diagnosis not present

## 2020-11-03 ENCOUNTER — Ambulatory Visit: Payer: BC Managed Care – PPO | Admitting: Physician Assistant

## 2020-11-04 ENCOUNTER — Ambulatory Visit (INDEPENDENT_AMBULATORY_CARE_PROVIDER_SITE_OTHER): Payer: BC Managed Care – PPO | Admitting: Physician Assistant

## 2020-11-04 ENCOUNTER — Other Ambulatory Visit: Payer: Self-pay | Admitting: Physician Assistant

## 2020-11-04 ENCOUNTER — Other Ambulatory Visit: Payer: Self-pay

## 2020-11-04 VITALS — BP 158/106 | HR 85 | Temp 97.3°F | Resp 18 | Ht 72.0 in | Wt >= 6400 oz

## 2020-11-04 DIAGNOSIS — E782 Mixed hyperlipidemia: Secondary | ICD-10-CM | POA: Diagnosis not present

## 2020-11-04 DIAGNOSIS — E559 Vitamin D deficiency, unspecified: Secondary | ICD-10-CM

## 2020-11-04 DIAGNOSIS — M5442 Lumbago with sciatica, left side: Secondary | ICD-10-CM

## 2020-11-04 DIAGNOSIS — G4733 Obstructive sleep apnea (adult) (pediatric): Secondary | ICD-10-CM

## 2020-11-04 DIAGNOSIS — I5022 Chronic systolic (congestive) heart failure: Secondary | ICD-10-CM

## 2020-11-04 DIAGNOSIS — K219 Gastro-esophageal reflux disease without esophagitis: Secondary | ICD-10-CM

## 2020-11-04 DIAGNOSIS — R7303 Prediabetes: Secondary | ICD-10-CM | POA: Diagnosis not present

## 2020-11-04 DIAGNOSIS — R197 Diarrhea, unspecified: Secondary | ICD-10-CM

## 2020-11-04 DIAGNOSIS — I10 Essential (primary) hypertension: Secondary | ICD-10-CM

## 2020-11-04 MED ORDER — ENTRESTO 49-51 MG PO TABS: 1.0000 | ORAL_TABLET | Freq: Two times a day (BID) | ORAL | 1 refills | Status: DC

## 2020-11-04 MED ORDER — AMLODIPINE BESYLATE 10 MG PO TABS: 10.0000 mg | ORAL_TABLET | Freq: Every day | ORAL | 0 refills | Status: DC

## 2020-11-04 MED ORDER — OMEPRAZOLE 20 MG PO CPDR: 20.0000 mg | DELAYED_RELEASE_CAPSULE | Freq: Every day | ORAL | 0 refills | Status: DC

## 2020-11-04 MED ORDER — METFORMIN HCL 500 MG PO TABS: 500.0000 mg | ORAL_TABLET | Freq: Every day | ORAL | 2 refills | Status: DC

## 2020-11-04 MED ORDER — CARVEDILOL 25 MG PO TABS: 25.0000 mg | ORAL_TABLET | Freq: Two times a day (BID) | ORAL | 0 refills | Status: DC

## 2020-11-04 MED ORDER — SPIRONOLACTONE 25 MG PO TABS: 25.0000 mg | ORAL_TABLET | Freq: Every day | ORAL | 0 refills | Status: DC

## 2020-11-04 MED ORDER — CYCLOBENZAPRINE HCL 10 MG PO TABS: 10.0000 mg | ORAL_TABLET | Freq: Three times a day (TID) | ORAL | 0 refills | Status: DC | PRN

## 2020-11-04 MED ORDER — HYDRALAZINE HCL 25 MG PO TABS: 50.0000 mg | ORAL_TABLET | Freq: Three times a day (TID) | ORAL | 0 refills | Status: DC

## 2020-11-04 MED ORDER — ATORVASTATIN CALCIUM 20 MG PO TABS: 20.0000 mg | ORAL_TABLET | Freq: Every day | ORAL | 0 refills | Status: DC

## 2020-11-04 MED FILL — CYCLOBENZAPRINE 10 MG TAB: 10 | 10 days supply | Qty: 30 | Fill #0

## 2020-11-04 MED FILL — METFORMIN HCL 500 MG TABS: 500 | 30 days supply | Qty: 30 | Fill #0

## 2020-11-04 MED FILL — ATORVASTATIN CALCIUM 20 MG: 20 | 30 days supply | Qty: 30 | Fill #0

## 2020-11-04 MED FILL — hydrALAZINE HCL 25 MG TABS: 25 | 30 days supply | Qty: 180 | Fill #0

## 2020-11-04 MED FILL — SPIRONOLACTONE 25 MG TABLET: 25 | 30 days supply | Qty: 30 | Fill #0

## 2020-11-04 MED FILL — AMLODIPINE BESYLATE 10 MG T: 10 | 30 days supply | Qty: 30 | Fill #0

## 2020-11-04 MED FILL — CARVEDILOL 25 MG TABLET: 25 | 30 days supply | Qty: 60 | Fill #0

## 2020-11-04 NOTE — Progress Notes (Signed)
Patient has taken medication around 8am and has only had water today. Patient complains of lower back pain beginning Saturday with LL tingling when driving or sitting.

## 2020-11-04 NOTE — Progress Notes (Signed)
Established Patient Office Visit  Subjective:  Patient ID: Leonard Lewis, male    DOB: [birth year]  Age: 40 y.o. MRN: [id number]  CC:  Chief Complaint  Patient presents with   Hypertension    HPI Leonard Lewis presents for medication refills   Reports that he does not check his blood pressure at home.  States he is taking his blood pressure medication.  Reports that he has difficulties affording Entresto, despite having a payment plan available at the pharmacy.  Reports that he is not using his CPAP machine, states that he tried it for 1 week, but was unable to get comfortable.  Reports that he still has difficulty sleeping, states that he feels he gets his best sleep in the early morning.  Reports that he has been having lower back pain, radiating down his left leg.  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  Has tried ibuprofen 800 mg without relief.  Denies numbness or tingling, denies saddle anesthesia.  Reports that he has been taking [place] on a daily basis, does not check blood sugars at home.  Reports that he has difficulty affording this medication.  Reports that he has never taken Metformin.  Reports that he is not following a diabetic diet, states that he drinks full sugar sodas and sweet tea on an every day basis.  Reports that he has been having episodes of diarrhea soon after eating his first meal of the day for the last couple of months.  Reports that he will have abdominal cramping and then the episode of diarrhea which relieves the cramping.  States this is occurring approximately every other day has not tried anything for relief, is unsure if any correlation to foods that he is eating.  Past Medical History:  Diagnosis Date   Arthritis    Asthma    Back pain    CHF (congestive heart failure) ([place]) 2008   EF 40-50%         Fatty liver    Hypertension    Leg  edema    Obesity, morbid ([place])    Obstructive sleep apnea    Prediabetes     Past Surgical History:  Procedure Laterality Date   CARDIAC CATHETERIZATION  Oct 2013   cardiac cath negative for obstructive disease -- did show end diastolic pressure secondary to systemic hypertension   LAPAROSCOPIC GASTRIC SLEEVE RESECTION N/A 01/27/2015   Procedure: LAPAROSCOPIC GASTRIC SLEEVE RESECTION WITH UPPER ENDOSCOPY;  Surgeon: Johnathan Hausen, MD;  Location: [facility];  Service: General;  Laterality: N/A;   LEFT HEART CATHETERIZATION WITH CORONARY ANGIOGRAM N/A 08/03/2012   Procedure: LEFT HEART CATHETERIZATION WITH CORONARY ANGIOGRAM;  Surgeon: Sherren Mocha, MD;  Location: [facility] CATH LAB;  Service: Cardiovascular;  Laterality: N/A;   NO PAST SURGERIES      Family History  Problem Relation Age of Onset   Hypertension Mother    Obesity Mother    Hypertension Father     Social History   Socioeconomic History   Marital status: Single    Spouse name: Not on file   Number of children: Not on file   Years of education: Not on file   Highest education level: Not on file  Occupational History   Occupation: Orthoptist  Tobacco Use   Smoking status: Former Smoker    Types: Cigarettes    Quit  date: 10/04/2006    Years since quitting: 14.1   Smokeless tobacco: Never Used  Substance and Sexual Activity   Alcohol use: No   Drug use: No   Sexual activity: Not on file  Other Topics Concern   Not on file  Social History Narrative   Employed:  Services [employer].    Social Determinants of Health   Financial Resource Strain: Not on file  Food Insecurity: Not on file  Transportation Needs: Not on file  Physical Activity: Not on file  Stress: Not on file  Social Connections: Not on file  Intimate Partner Violence: Not on file    Outpatient Medications Prior to Visit  Medication Sig Dispense Refill   aspirin EC 81 MG tablet Take 81 mg by mouth daily.      Diclofenac Sodium (PENNSAID) 2 % SOLN Place 1 application onto the skin 2 (two) times daily. 112 g 2   FARXIGA 10 MG TABS tablet Take 1 tablet (10 mg total) by mouth daily. 30 tablet 1   fluticasone (FLONASE) 50 MCG/ACT nasal spray Place 2 sprays into both nostrils daily. 16 g 6   Vitamin D, Ergocalciferol, (DRISDOL) 1.25 MG (50000 UNIT) CAPS capsule Take 1 capsule (50,000 Units total) by mouth every 7 (seven) days. 12 capsule 6   amLODipine (NORVASC) 10 MG tablet Take 1 tablet (10 mg total) by mouth daily. 90 tablet 0   atorvastatin (LIPITOR) 20 MG tablet Take 1 tablet (20 mg total) by mouth daily. 90 tablet 0   carvedilol (COREG) 25 MG tablet Take 1 tablet (25 mg total) by mouth 2 (two) times daily. 180 tablet 0   ENTRESTO 49-51 MG Take 1 tablet by mouth 2 (two) times daily. 60 tablet 1   hydrALAZINE (APRESOLINE) 25 MG tablet Take 2 tablets (50 mg total) by mouth 3 (three) times daily. 540 tablet 0   omeprazole (PRILOSEC) 20 MG capsule Take 1 capsule (20 mg total) by mouth daily. 30 capsule 0   spironolactone (ALDACTONE) 25 MG tablet Take 1 tablet (25 mg total) by mouth daily. 90 tablet 0   No facility-administered medications prior to visit.    No Known Allergies  ROS Review of Systems  Constitutional: Positive for fatigue.  HENT: Negative.   Eyes: Negative.   Respiratory: Negative.   Cardiovascular: Negative.   Gastrointestinal: Positive for abdominal pain and diarrhea. Negative for nausea and vomiting.  Endocrine: Negative.   Genitourinary: Negative.   Musculoskeletal: Positive for back pain.  Skin: Negative.   Allergic/Immunologic: Negative.   Neurological: Negative.   Hematological: Negative.   Psychiatric/Behavioral: Negative.       Objective:    Physical Exam Constitutional:      General: He is not in acute distress.    Appearance: Normal appearance. He is obese. He is not ill-appearing.  HENT:     Head: Normocephalic and atraumatic.     Right Ear:  External ear normal.     Left Ear: External ear normal.     Nose: Nose normal.     Mouth/Throat:     Mouth: Mucous membranes are moist.     Pharynx: Oropharynx is clear.  Cardiovascular:     Rate and Rhythm: Normal rate and regular rhythm.     Pulses: Normal pulses.     Heart sounds: Normal heart sounds.  Pulmonary:     Effort: Pulmonary effort is normal.     Breath sounds: Normal breath sounds.  Abdominal:     General: There is  no distension.     Palpations: Abdomen is soft.  Musculoskeletal:     Cervical back: Normal range of motion and neck supple.     Thoracic back: Tenderness present.     Lumbar back: Tenderness present.     Comments: Generalized lower back tenderness  Skin:    General: Skin is warm and dry.  Neurological:     General: No focal deficit present.     Mental Status: He is alert and oriented to person, place, and time. Mental status is at baseline.  Psychiatric:        Mood and Affect: Mood normal.        Behavior: Behavior normal.        Thought Content: Thought content normal.        Judgment: Judgment normal.     BP (!) 158/106 (BP Location: Right Arm, Patient Position: Sitting, Cuff Size: Large)    Pulse 85    Temp (!) 97.3 F (36.3 C) (Oral)    Resp 18    Ht 6' (1.829 m)    Wt (!) 426 lb (193.2 kg)    SpO2 96%    BMI 57.78 kg/m  Wt Readings from Last 3 Encounters:  11/04/20 (!) 426 lb (193.2 kg)  08/04/20 (!) 448 lb (203.2 kg)  06/20/20 (!) 437 lb (198.2 kg)     Health Maintenance Due  Topic Date Due   COVID-19 Vaccine (2 - Booster for Janssen series) 03/07/2020   INFLUENZA VACCINE  05/04/2020    There are no preventive care reminders to display for this patient.  Lab Results  Component Value Date   TSH 1.190 06/19/2019   Lab Results  Component Value Date   WBC 10.7 (H) 08/04/2020   HGB 11.8 (L) 08/04/2020   HCT 38.3 (L) 08/04/2020   MCV 71.1 (L) 08/04/2020   PLT 366 08/04/2020   Lab Results  Component Value Date   NA 137  08/04/2020   K 4.1 08/04/2020   CO2 23 08/04/2020   GLUCOSE 151 (H) 08/04/2020   BUN 19 08/04/2020   CREATININE 1.63 (H) 08/04/2020   BILITOT 0.4 08/04/2020   ALKPHOS 43 08/04/2020   AST 20 08/04/2020   ALT 18 08/04/2020   PROT 8.0 08/04/2020   ALBUMIN 3.4 (L) 08/04/2020   CALCIUM 9.1 08/04/2020   ANIONGAP 11 08/04/2020   GFR 65.28 08/01/2012   Lab Results  Component Value Date   CHOL 189 06/19/2019   Lab Results  Component Value Date   HDL 40 06/19/2019   Lab Results  Component Value Date   LDLCALC 115 (H) 06/19/2019   Lab Results  Component Value Date   TRIG 191 (H) 06/19/2019   Lab Results  Component Value Date   CHOLHDL 4.7 06/19/2019   Lab Results  Component Value Date   HGBA1C 5.9 (H) 12/26/2019      Assessment & Plan:   Problem List Items Addressed This Visit      Cardiovascular and Mediastinum   HYPERTENSION, BENIGN ESSENTIAL - Primary   Relevant Medications   amLODipine (NORVASC) 10 MG tablet   atorvastatin (LIPITOR) 20 MG tablet   carvedilol (COREG) 25 MG tablet   ENTRESTO 49-51 MG   hydrALAZINE (APRESOLINE) 25 MG tablet   spironolactone (ALDACTONE) 25 MG tablet   Other Relevant Orders   CBC with Differential/Platelet   Comp. Metabolic Panel (12)   Chronic systolic heart failure (HCC)   Relevant Medications   amLODipine (NORVASC) 10 MG tablet  atorvastatin (LIPITOR) 20 MG tablet   carvedilol (COREG) 25 MG tablet   ENTRESTO 49-51 MG   hydrALAZINE (APRESOLINE) 25 MG tablet   spironolactone (ALDACTONE) 25 MG tablet     Other   HLD (hyperlipidemia)   Relevant Medications   amLODipine (NORVASC) 10 MG tablet   atorvastatin (LIPITOR) 20 MG tablet   carvedilol (COREG) 25 MG tablet   ENTRESTO 49-51 MG   hydrALAZINE (APRESOLINE) 25 MG tablet   spironolactone (ALDACTONE) 25 MG tablet   Other Relevant Orders   Lipid panel   Prediabetes   Relevant Medications   metFORMIN (GLUCOPHAGE) 500 MG tablet   Vitamin D deficiency   Relevant Orders    Vitamin D 1,25 dihydroxy    Other Visit Diagnoses    OSA (obstructive sleep apnea)       Acute left-sided low back pain with left-sided sciatica       Relevant Medications   cyclobenzaprine (FLEXERIL) 10 MG tablet   Gastroesophageal reflux disease without esophagitis       Relevant Medications   omeprazole (PRILOSEC) 20 MG capsule      Meds ordered this encounter  Medications   cyclobenzaprine (FLEXERIL) 10 MG tablet    Sig: Take 1 tablet (10 mg total) by mouth 3 (three) times daily as needed for muscle spasms.    Dispense:  30 tablet    Refill:  0    Order Specific Question:   Supervising Provider    Answer:   Elsie Stain [[id number]]   metFORMIN (GLUCOPHAGE) 500 MG tablet    Sig: Take 1 tablet (500 mg total) by mouth daily with breakfast.    Dispense:  30 tablet    Refill:  2    Stop farxiga due to cost    Order Specific Question:   Supervising Provider    Answer:   Asencion Noble E [[id number]]   amLODipine (NORVASC) 10 MG tablet    Sig: Take 1 tablet (10 mg total) by mouth daily.    Dispense:  90 tablet    Refill:  0    Order Specific Question:   Supervising Provider    Answer:   Asencion Noble E [[id number]]   atorvastatin (LIPITOR) 20 MG tablet    Sig: Take 1 tablet (20 mg total) by mouth daily.    Dispense:  90 tablet    Refill:  0    Order Specific Question:   Supervising Provider    Answer:   Asencion Noble E [[id number]]   carvedilol (COREG) 25 MG tablet    Sig: Take 1 tablet (25 mg total) by mouth 2 (two) times daily.    Dispense:  180 tablet    Refill:  0    Order Specific Question:   Supervising Provider    Answer:   WRIGHT, PATRICK E [[id number]]   ENTRESTO 49-51 MG    Sig: Take 1 tablet by mouth 2 (two) times daily.    Dispense:  60 tablet    Refill:  1    Needs patient assistance    Order Specific Question:   Supervising Provider    Answer:   Elsie Stain [[id number]]   hydrALAZINE (APRESOLINE) 25 MG tablet    Sig: Take 2 tablets (50 mg total) by mouth 3  (three) times daily.    Dispense:  540 tablet    Refill:  0    Order Specific Question:   Supervising Provider    Answer:   Noralyn Pick  omeprazole (PRILOSEC) 20 MG capsule    Sig: Take 1 capsule (20 mg total) by mouth daily.    Dispense:  30 capsule    Refill:  0    Order Specific Question:   Supervising Provider    Answer:   Noralyn Pick   spironolactone (ALDACTONE) 25 MG tablet    Sig: Take 1 tablet (25 mg total) by mouth daily.    Dispense:  90 tablet    Refill:  0    Order Specific Question:   Supervising Provider    Answer:   WRIGHT, PATRICK E [[id number]]  1. HYPERTENSION, BENIGN ESSENTIAL Continue current regimen, patient encouraged to follow-up with cardiology for review of medications due to cost of Entresto.  Blood pressure elevated during office visit, encourage patient to check blood pressure on a daily basis, keep a written log and bring that to next office visit - CBC with Differential/Platelet - Comp. Metabolic Panel (12) - amLODipine (NORVASC) 10 MG tablet; Take 1 tablet (10 mg total) by mouth daily.  Dispense: 90 tablet; Refill: 0 - carvedilol (COREG) 25 MG tablet; Take 1 tablet (25 mg total) by mouth 2 (two) times daily.  Dispense: 180 tablet; Refill: 0 - ENTRESTO 49-51 MG; Take 1 tablet by mouth 2 (two) times daily.  Dispense: 60 tablet; Refill: 1 - hydrALAZINE (APRESOLINE) 25 MG tablet; Take 2 tablets (50 mg total) by mouth 3 (three) times daily.  Dispense: 540 tablet; Refill: 0  2. Mixed hyperlipidemia Continue current regimen - Lipid panel - atorvastatin (LIPITOR) 20 MG tablet; Take 1 tablet (20 mg total) by mouth daily.  Dispense: 90 tablet; Refill: 0  3. Prediabetes Trial Metformin, Wilder Glade is too expensive for patient. - metFORMIN (GLUCOPHAGE) 500 MG tablet; Take 1 tablet (500 mg total) by mouth daily with breakfast.  Dispense: 30 tablet; Refill: 2  4. OSA (obstructive sleep apnea) Patient education given on techniques to become more  comfortable with his CPAP, stressed importance of CPAP to his heart health  5. Chronic systolic heart failure (HCC)  - spironolactone (ALDACTONE) 25 MG tablet; Take 1 tablet (25 mg total) by mouth daily.  Dispense: 90 tablet; Refill: 0  6. Acute left-sided low back pain with left-sided sciatica Trial Flexeril, patient education given on gentle stretching, ice - cyclobenzaprine (FLEXERIL) 10 MG tablet; Take 1 tablet (10 mg total) by mouth 3 (three) times daily as needed for muscle spasms.  Dispense: 30 tablet; Refill: 0  7. Gastroesophageal reflux disease without esophagitis  - omeprazole (PRILOSEC) 20 MG capsule; Take 1 capsule (20 mg total) by mouth daily.  Dispense: 30 capsule; Refill: 0  8. Vitamin D deficiency  - Vitamin D 1,25 dihydroxy   I have reviewed the patient's medical history (PMH, PSH, Social History, Family History, Medications, and allergies) , and have been updated if relevant. I spent 30 minutes reviewing chart and  face to face time with patient.  9. Diarrhea Encourage patient to keep a journal of diarrhea episodes, work on improving water intake and fiber intake.   Follow-up: Return in about 3 months (around 02/01/2021).    Loraine Grip Mayers, PA-C

## 2020-11-04 NOTE — Patient Instructions (Addendum)
Work on reducing the amount of sugary drinks you are drinking every day, increase your hydration with water.  You will start taking Metformin once daily in the morning instead of your [place].  I encourage you to follow-up with cardiology regarding the expense of Entresto, I encourage you to check your blood pressure on a daily basis, keep a written log and bring that with you to all office visits.  I encourage you to work on improving your compliance to your CPAP.  I encourage you to work on keeping a journal of your diarrhea episodes, please let us know if there is no improvement.  For your back pain, you can continue to use ibuprofen as needed, and I sent a muscle relaxer to your pharmacy to help you with relief.  We will call you with your lab results  Roney Jaffe, PA-C Physician Assistant [facility] [email or web address]   Diarrhea, Adult Diarrhea is frequent loose and watery bowel movements. Diarrhea can make you feel weak and cause you to become dehydrated. Dehydration can make you tired and thirsty, cause you to have a dry mouth, and decrease how often you urinate. Diarrhea typically lasts 2-3 days. However, it can last longer if it is a sign of something more serious. It is important to treat your diarrhea as told by your health care provider. Follow these instructions at home: Eating and drinking Follow these recommendations as told by your health care provider:  Take an oral rehydration solution (ORS). This is an over-the-counter medicine that helps return your body to its normal balance of nutrients and water. It is found at pharmacies and retail stores.  Drink plenty of fluids, such as water, ice chips, diluted fruit juice, and low-calorie sports drinks. You can drink milk also, if desired.  Avoid drinking fluids that contain a lot of sugar or caffeine, such as energy drinks, sports drinks, and soda.  Eat bland,  easy-to-digest foods in small amounts as you are able. These foods include bananas, applesauce, rice, lean meats, toast, and crackers.  Avoid alcohol.  Avoid spicy or fatty foods.      Medicines  Take over-the-counter and prescription medicines only as told by your health care provider.  If you were prescribed an antibiotic medicine, take it as told by your health care provider. Do not stop using the antibiotic even if you start to feel better. General instructions  Wash your hands often using soap and water. If soap and water are not available, use a hand sanitizer. Others in the household should wash their hands as well. Hands should be washed: ? After using the toilet or changing a diaper. ? Before preparing, cooking, or serving food. ? While caring for a sick person or while visiting someone in a hospital.  Drink enough fluid to keep your urine pale yellow.  Rest at home while you recover.  Watch your condition for any changes.  Take a warm bath to relieve any burning or pain from frequent diarrhea episodes.  Keep all follow-up visits as told by your health care provider. This is important.   Contact a health care provider if:  You have a fever.  Your diarrhea gets worse.  You have new symptoms.  You cannot keep fluids down.  You feel light-headed or dizzy.  You have a headache.  You have muscle cramps. Get help right away if:  You have chest pain.  You feel extremely weak or you faint.  You have bloody or black stools  or stools that look like tar.  You have severe pain, cramping, or bloating in your abdomen.  You have trouble breathing or you are breathing very quickly.  Your heart is beating very quickly.  Your skin feels cold and clammy.  You feel confused.  You have signs of dehydration, such as: ? Dark urine, very little urine, or no urine. ? Cracked lips. ? Dry mouth. ? Sunken eyes. ? Sleepiness. ? Weakness. Summary  Diarrhea is frequent  loose and watery bowel movements. Diarrhea can make you feel weak and cause you to become dehydrated.  Drink enough fluids to keep your urine pale yellow.  Make sure that you wash your hands after using the toilet. If soap and water are not available, use hand sanitizer.  Contact a health care provider if your diarrhea gets worse or you have new symptoms.  Get help right away if you have signs of dehydration. This information is not intended to replace advice given to you by your health care provider. Make sure you discuss any questions you have with your health care provider. Document Revised: 02/06/2019 Document Reviewed: 02/24/2018 Elsevier Patient Education  2021 Elsevier Inc.    Sciatica  Sciatica is pain, numbness, weakness, or tingling along the path of the sciatic nerve. The sciatic nerve starts in the lower back and runs down the back of each leg. The nerve controls the muscles in the lower leg and in the back of the knee. It also provides feeling (sensation) to the back of the thigh, the lower leg, and the sole of the foot. Sciatica is a symptom of another medical condition that pinches or puts pressure on the sciatic nerve. Sciatica most often only affects one side of the body. Sciatica usually goes away on its own or with treatment. In some cases, sciatica may come back (recur). What are the causes? This condition is caused by pressure on the sciatic nerve or pinching of the nerve. This may be the result of:  A disk in between the bones of the spine bulging out too far (herniated disk).  Age-related changes in the spinal disks.  A pain disorder that affects a muscle in the buttock.  Extra bone growth near the sciatic nerve.  A break (fracture) of the pelvis.  Pregnancy.  Tumor. This is rare. What increases the risk? The following factors may make you more likely to develop this condition:  Playing sports that place pressure or stress on the spine.  Having poor  strength and flexibility.  A history of back injury or surgery.  Sitting for long periods of time.  Doing activities that involve repetitive bending or lifting.  Obesity. What are the signs or symptoms? Symptoms can vary from mild to very severe, and they may include:  Any of these problems in the lower back, leg, hip, or buttock: ? Mild tingling, numbness, or dull aches. ? Burning sensations. ? Sharp pains.  Numbness in the back of the calf or the sole of the foot.  Leg weakness.  Severe back pain that makes movement difficult. Symptoms may get worse when you cough, sneeze, or laugh, or when you sit or stand for long periods of time. How is this diagnosed? This condition may be diagnosed based on:  Your symptoms and medical history.  A physical exam.  Blood tests.  Imaging tests, such as: ? X-rays. ? MRI. ? CT scan. How is this treated? In many cases, this condition improves on its own without treatment. However, treatment  may include:  Reducing or modifying physical activity.  Exercising and stretching.  Icing and applying heat to the affected area.  Medicines that help to: ? Relieve pain and swelling. ? Relax your muscles.  Injections of medicines that help to relieve pain, irritation, and inflammation around the sciatic nerve (steroids).  Surgery. Follow these instructions at home: Medicines  Take over-the-counter and prescription medicines only as told by your health care provider.  Ask your health care provider if the medicine prescribed to you: ? Requires you to avoid driving or using heavy machinery. ? Can cause constipation. You may need to take these actions to prevent or treat constipation:  Drink enough fluid to keep your urine pale yellow.  Take over-the-counter or prescription medicines.  Eat foods that are high in fiber, such as beans, whole grains, and fresh fruits and vegetables.  Limit foods that are high in fat and processed  sugars, such as fried or sweet foods. Managing pain  If directed, put ice on the affected area. ? Put ice in a plastic bag. ? Place a towel between your skin and the bag. ? Leave the ice on for 20 minutes, 2-3 times a day.  If directed, apply heat to the affected area. Use the heat source that your health care provider recommends, such as a moist heat pack or a heating pad. ? Place a towel between your skin and the heat source. ? Leave the heat on for 20-30 minutes. ? Remove the heat if your skin turns bright red. This is especially important if you are unable to feel pain, heat, or cold. You may have a greater risk of getting burned.      Activity  Return to your normal activities as told by your health care provider. Ask your health care provider what activities are safe for you.  Avoid activities that make your symptoms worse.  Take brief periods of rest throughout the day. ? When you rest for longer periods, mix in some mild activity or stretching between periods of rest. This will help to prevent stiffness and pain. ? Avoid sitting for long periods of time without moving. Get up and move around at least one time each hour.  Exercise and stretch regularly, as told by your health care provider.  Do not lift anything that is heavier than 10 lb (4.5 kg) while you have symptoms of sciatica. When you do not have symptoms, you should still avoid heavy lifting, especially repetitive heavy lifting.  When you lift objects, always use proper lifting technique, which includes: ? Bending your knees. ? Keeping the load close to your body. ? Avoiding twisting.   General instructions  Maintain a healthy weight. Excess weight puts extra stress on your back.  Wear supportive, comfortable shoes. Avoid wearing high heels.  Avoid sleeping on a mattress that is too soft or too hard. A mattress that is firm enough to support your back when you sleep may help to reduce your pain.  Keep all  follow-up visits as told by your health care provider. This is important. Contact a health care provider if:  You have pain that: ? Wakes you up when you are sleeping. ? Gets worse when you lie down. ? Is worse than you have experienced in the past. ? Lasts longer than 4 weeks.  You have an unexplained weight loss. Get help right away if:  You are not able to control when you urinate or have bowel movements (incontinence).  You have: ?  Weakness in your lower back, pelvis, buttocks, or legs that gets worse. ? Redness or swelling of your back. ? A burning sensation when you urinate. Summary  Sciatica is pain, numbness, weakness, or tingling along the path of the sciatic nerve.  This condition is caused by pressure on the sciatic nerve or pinching of the nerve.  Sciatica can cause pain, numbness, or tingling in the lower back, legs, hips, and buttocks.  Treatment often includes rest, exercise, medicines, and applying ice or heat. This information is not intended to replace advice given to you by your health care provider. Make sure you discuss any questions you have with your health care provider. Document Revised: 10/09/2018 Document Reviewed: 10/09/2018 Elsevier Patient Education  2021 [employer].

## 2020-11-06 ENCOUNTER — Encounter: Payer: Self-pay | Admitting: Physician Assistant

## 2020-11-06 DIAGNOSIS — M5442 Lumbago with sciatica, left side: Secondary | ICD-10-CM | POA: Insufficient documentation

## 2020-11-06 DIAGNOSIS — G4733 Obstructive sleep apnea (adult) (pediatric): Secondary | ICD-10-CM | POA: Diagnosis not present

## 2020-11-06 DIAGNOSIS — K219 Gastro-esophageal reflux disease without esophagitis: Secondary | ICD-10-CM | POA: Insufficient documentation

## 2020-11-06 DIAGNOSIS — R197 Diarrhea, unspecified: Secondary | ICD-10-CM | POA: Insufficient documentation

## 2020-11-14 LAB — CBC WITH DIFFERENTIAL/PLATELET
Basophils Absolute: 0.1 10*3/uL (ref 0.0–0.2)
Basos: 1 %
EOS (ABSOLUTE): 0.2 10*3/uL (ref 0.0–0.4)
Eos: 3 %
Hematocrit: 40.3 % (ref 37.5–51.0)
Hemoglobin: 12.5 g/dL — ABNORMAL LOW (ref 13.0–17.7)
Immature Grans (Abs): 0 10*3/uL (ref 0.0–0.1)
Immature Granulocytes: 0 %
Lymphocytes Absolute: 2.4 10*3/uL (ref 0.7–3.1)
Lymphs: 33 %
MCH: 21.9 pg — ABNORMAL LOW (ref 26.6–33.0)
MCHC: 31 g/dL — ABNORMAL LOW (ref 31.5–35.7)
MCV: 71 fL — ABNORMAL LOW (ref 79–97)
Monocytes Absolute: 0.5 10*3/uL (ref 0.1–0.9)
Monocytes: 7 %
Neutrophils Absolute: 4.1 10*3/uL (ref 1.4–7.0)
Neutrophils: 56 %
Platelets: 337 10*3/uL (ref 150–450)
RBC: 5.71 x10E6/uL (ref 4.14–5.80)
RDW: 17.2 % — ABNORMAL HIGH (ref 11.6–15.4)
WBC: 7.3 10*3/uL (ref 3.4–10.8)

## 2020-11-14 LAB — COMP. METABOLIC PANEL (12)
AST: 14 IU/L (ref 0–40)
Albumin/Globulin Ratio: 1.3 (ref 1.2–2.2)
Albumin: 3.8 g/dL — ABNORMAL LOW (ref 4.0–5.0)
Alkaline Phosphatase: 66 IU/L (ref 44–121)
BUN/Creatinine Ratio: 10 (ref 9–20)
BUN: 16 mg/dL (ref 6–24)
Bilirubin Total: 0.6 mg/dL (ref 0.0–1.2)
Calcium: 9 mg/dL (ref 8.7–10.2)
Chloride: 104 mmol/L (ref 96–106)
Creatinine, Ser: 1.54 mg/dL — ABNORMAL HIGH (ref 0.76–1.27)
GFR calc Af Amer: 64 mL/min/{1.73_m2} (ref >59)
GFR calc non Af Amer: 56 mL/min/{1.73_m2} — ABNORMAL LOW (ref >59)
Globulin, Total: 3 g/dL (ref 1.5–4.5)
Glucose: 114 mg/dL — ABNORMAL HIGH (ref 65–99)
Potassium: 4 mmol/L (ref 3.5–5.2)
Sodium: 141 mmol/L (ref 134–144)
Total Protein: 6.8 g/dL (ref 6.0–8.5)

## 2020-11-14 LAB — VITAMIN D 1,25 DIHYDROXY
Vitamin D 1, 25 (OH)2 Total: 65 pg/mL
Vitamin D2 1, 25 (OH)2: 60 pg/mL
Vitamin D3 1, 25 (OH)2: <10 pg/mL

## 2020-11-14 LAB — LIPID PANEL
Chol/HDL Ratio: 4 ratio (ref 0.0–5.0)
Cholesterol, Total: 174 mg/dL (ref 100–199)
HDL: 43 mg/dL (ref >39)
LDL Chol Calc (NIH): 110 mg/dL — ABNORMAL HIGH (ref 0–99)
Triglycerides: 116 mg/dL (ref 0–149)
VLDL Cholesterol Cal: 21 mg/dL (ref 5–40)

## 2020-11-16 ENCOUNTER — Other Ambulatory Visit: Payer: Self-pay | Admitting: Physician Assistant

## 2020-11-16 DIAGNOSIS — E782 Mixed hyperlipidemia: Secondary | ICD-10-CM

## 2020-11-16 DIAGNOSIS — D649 Anemia, unspecified: Secondary | ICD-10-CM

## 2020-11-16 MED ORDER — ATORVASTATIN CALCIUM 40 MG PO TABS: 40.0000 mg | ORAL_TABLET | Freq: Every day | ORAL | 2 refills | Status: DC

## 2020-11-19 ENCOUNTER — Telehealth: Payer: Self-pay | Admitting: *Deleted

## 2020-11-19 NOTE — Telephone Encounter (Signed)
-----   Message from Roney Jaffe, [place] sent at 11/16/2020  9:42 AM EST ----- Lab results were resulted on 11/15/2020, these were from 11/04/2020.  He does show signs of mild anemia, patient should have follow-up iron panel completed for further review, kidney function and liver function within normal limits, however he does show signs of dehydration.  LDL is still not within goal with his cholesterol, will increase atorvastatin to 40 mg.  He can take 2 of his tablets he has now until he is able to pick up new prescription.  Please schedule for follow-up iron studies

## 2020-11-19 NOTE — Telephone Encounter (Signed)
Medical Assistant left message on patient's home and cell voicemail. Voicemail states to give a call back to [place] with [facility] at [phone]. Patient is aware of results via VM per signed DPR. Patient needs a follow up lab appointment for iron panel.

## 2020-11-26 DIAGNOSIS — R739 Hyperglycemia, unspecified: Secondary | ICD-10-CM | POA: Diagnosis not present

## 2020-11-26 DIAGNOSIS — E559 Vitamin D deficiency, unspecified: Secondary | ICD-10-CM | POA: Diagnosis not present

## 2020-11-26 DIAGNOSIS — I10 Essential (primary) hypertension: Secondary | ICD-10-CM | POA: Diagnosis not present

## 2020-11-26 DIAGNOSIS — E785 Hyperlipidemia, unspecified: Secondary | ICD-10-CM | POA: Diagnosis not present

## 2020-11-26 DIAGNOSIS — Z117 Encounter for testing for latent tuberculosis infection: Secondary | ICD-10-CM | POA: Diagnosis not present

## 2020-12-11 ENCOUNTER — Other Ambulatory Visit: Payer: Self-pay | Admitting: Physician Assistant

## 2020-12-11 DIAGNOSIS — E782 Mixed hyperlipidemia: Secondary | ICD-10-CM

## 2021-02-03 ENCOUNTER — Ambulatory Visit: Payer: BC Managed Care – PPO | Admitting: Critical Care Medicine

## 2021-02-03 NOTE — Progress Notes (Deleted)
Subjective:    Patient ID: Leonard Lewis, male    DOB: 1981/03/30, 40 y.o.   MRN: [id number]  06/19/19 This is a pleasant 40 year old obese male previously a patient of the family [place] as of January 2020.  Since that time the patient wishes to now establish care for care and this is a first visit.  The patient has history of longstanding morbid obesity, chronic systolic heart failure, hypertension, sleep apnea, hyperlipidemia and prediabetes.  The patient last had a sleep study over 10 years ago and tried CPAP but failed that and has not been on CPAP since that time.  The patient's weight is also been slowly increasing.  He had a gastric sleeve placement previously but this failed him.  The patient has been on antihypertensive medication along with treatment for his systolic heart failure however he is not yet gotten to go with his blood pressure.  His dietary compliance during the pandemic has been difficult for him.  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  The patient has been labeled prediabetic and needs follow-up on this.  He does not monitor his blood sugars regularly at home.  Patient was in the emergency room recently for left ankle pain after overuse but this has improved.  His wife observes that he will stop breathing and occasionally waking from sleep short of breath.  He has to sleep with several pillows.  He does have a cardiologist but has not seen that doctor in several years.  There is an echocardiogram from 2016 which showed ejection fraction 40 to 45% with diffuse hypokinesis.  There is no evidence or history of ischemic heart disease in the past.  He has had a cardiac catheterization without obstructive coronary disease seen.  He is never had previous myocardial infarctions.  The patient needs a flu vaccine  01/08/2020 The patient returns on referral from his new primary care  provider for evaluation of sleep apnea.  I attempted to make a referral to the sleep clinic in September but no appointment was ever made or kept.  The patient first diagnosed with sleep apnea in 2014.  He had previously done a sleep study at that time which showed significant disease.  He however has not used his CPAP in 5 years as he did not like the effect of the CPAP and it did not interface with him well.  His current machine no longer is operating.  The patient lost weight seem to improve but now has gained weight again and is worse  The patient scores high on the Epworth scale  Patient has significant daytime hypersomnolence snoring and observed apneas at night.  He has decreased memory decreased focus he is referred now back to our clinic to see if we can achieve for him a sleep study and CPAP therapy  04/29/2020 Patient is seen return follow-up for chronic systolic heart failure, morbid obesity, severe obstructive sleep apnea At the last visit we attempted to obtain for the patient a sleep study this was obtained and it did show severe obstructive sleep apnea and cardiology who read the study accidentally tried to order the CPAP and then realized that I was referring provider unfortunate I was not notified of this and there is been a delay in getting the patient his CPAP equipment  Based on the readings of the CPAP recommendations he would need at least 10 to 15 cm  water pressure and a full facemask  The patient maintains on hydralazine 50 mg 3 times daily he is now on lisinopril 40 mg a day because he cannot afford the Entresto also he has not been receiving his for cheek as well.  Note as well he needs refills on vitamin D  Overall there are no change in the patient's symptoms  0/10/270  Chronic systolic heart failure I contacted our pharmacist and we can obtain for this patient the Entresto we will discontinue lisinopril and begin the [facility] as prescribed at a dose of 49/51 1  twice daily  Obstructive sleep apnea We will plan to obtain for the patient a full face large mask with CPAP 10 to 15 cm water pressure and bring the patient back in follow-up we will order this through his [employer]  Vitamin D deficiency Documented prior history of vitamin D deficiency the patient needs refills on this he will receive 50,000 units weekly and I did send a refill in for him at this visit   Diagnoses and all orders for this visit:  Obstructive sleep apnea -     For home use only DME continuous positive airway pressure (CPAP)  Morbid obesity (HCC)  Chronic systolic heart failure (HCC)  HYPERTENSION, BENIGN ESSENTIAL  Vitamin D deficiency -     VITAMIN D 25 Hydroxy (Vit-D Deficiency, Fractures)  Other orders -     ENTRESTO 49-51 MG; Take 1 tablet by mouth 2 (two) times daily. -     FARXIGA 10 MG TABS tablet; Take 1 tablet (10 mg total) by mouth daily.     Wt Readings from Last 3 Encounters:  11/04/20 (!) 426 lb (193.2 kg)  08/04/20 (!) 448 lb (203.2 kg)  06/20/20 (!) 437 lb (198.2 kg)    Past Medical History:  Diagnosis Date  . Arthritis   . Asthma   . Back pain   . CHF (congestive heart failure) ([place]) 2008   EF 40-50%        . Fatty liver   . Hypertension   . Leg edema   . Obesity, morbid ([place])   . Obstructive sleep apnea   . Prediabetes      Family History  Problem Relation Age of Onset  . Hypertension Mother   . Obesity Mother   . Hypertension Father      Social History   Socioeconomic History  . Marital status: Married    Spouse name: Not on file  . Number of children: Not on file  . Years of education: Not on file  . Highest education level: Not on file  Occupational History  . Occupation: Orthoptist  Tobacco Use  . Smoking status: Former Smoker    Types: Cigarettes    Quit date: 10/04/2006    Years since quitting: 14.3  . Smokeless tobacco: Never Used  Substance and Sexual Activity  . Alcohol use:  No  . Drug use: No  . Sexual activity: Not on file  Other Topics Concern  . 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    Social Determinants of Health   Financial Resource Strain: Not on file  Food Insecurity: Not on file  Transportation Needs: Not on file  Physical Activity: Not on file  Stress: Not on file  Social Connections: Not on file  Intimate Partner Violence: Not on file     No Known Allergies   Outpatient Medications Prior to  Visit  Medication Sig Dispense Refill  . amLODipine (NORVASC) 10 MG tablet TAKE 1 TABLET (10 MG TOTAL) BY MOUTH DAILY. 90 tablet 0  . aspirin EC 81 MG tablet Take 81 mg by mouth daily.    Marland Kitchen atorvastatin (LIPITOR) 40 MG tablet TAKE 1 TABLET BY MOUTH EVERY DAY 90 tablet 1  . carvedilol (COREG) 25 MG tablet TAKE 1 TABLET (25 MG TOTAL) BY MOUTH 2 (TWO) TIMES DAILY. 180 tablet 0  . cyclobenzaprine (FLEXERIL) 10 MG tablet TAKE 1 TABLET (10 MG TOTAL) BY MOUTH 3 (THREE) TIMES DAILY AS NEEDED FOR MUSCLE SPASMS. 30 tablet 0  . Diclofenac Sodium (PENNSAID) 2 % SOLN Place 1 application onto the skin 2 (two) times daily. 112 g 2  . ENTRESTO 49-51 MG TAKE 1 TABLET BY MOUTH 2 (TWO) TIMES DAILY. 60 tablet 1  . FARXIGA 10 MG TABS tablet Take 1 tablet (10 mg total) by mouth daily. 30 tablet 1  . fluticasone (FLONASE) 50 MCG/ACT nasal spray Place 2 sprays into both nostrils daily. 16 g 6  . hydrALAZINE (APRESOLINE) 25 MG tablet TAKE 2 TABLETS (50 MG TOTAL) BY MOUTH 3 (THREE) TIMES DAILY. 540 tablet 0  . metFORMIN (GLUCOPHAGE) 500 MG tablet TAKE 1 TABLET (500 MG TOTAL) BY MOUTH DAILY WITH BREAKFAST. 30 tablet 2  . omeprazole (PRILOSEC) 20 MG capsule Take 1 capsule (20 mg total) by mouth daily. 30 capsule 0  . spironolactone (ALDACTONE) 25 MG tablet TAKE 1 TABLET (25 MG TOTAL) BY MOUTH DAILY. 90 tablet 0  . Vitamin D, Ergocalciferol, (DRISDOL) 1.25 MG (50000 UNIT) CAPS capsule Take 1 capsule (50,000 Units total) by mouth every 7 (seven)  days. 12 capsule 6   No facility-administered medications prior to visit.    Review of Systems Constitutional:   No  weight loss, night sweats,  Fevers, chills, fatigue, lassitude. HEENT:   No headaches,  Difficulty swallowing,  Tooth/dental problems,  Sore throat,                No sneezing, itching, ear ache, nasal congestion, post nasal drip,   CV:  No chest pain,  Orthopnea, PND, swelling in lower extremities, anasarca, dizziness, palpitations  GI  No heartburn, indigestion, abdominal pain, nausea, vomiting, diarrhea, change in bowel habits, loss of appetite  Resp: No shortness of breath with exertion or at rest.  No excess mucus, no productive cough,  No non-productive cough,  No coughing up of blood.  No change in color of mucus.  No wheezing.  No chest wall deformity  Skin: no rash or lesions.  GU: no dysuria, change in color of urine, no urgency or frequency.  No flank pain.  MS:  No joint pain or swelling.  No decreased range of motion.  No back pain.  Psych:  No change in mood or affect. No depression or anxiety.  No memory loss.     Objective:   Physical Exam There were no vitals filed for this visit.  Gen: Pleasant, morbidly obese, in no distress,  normal affect  ENT: No lesions,  mouth clear,  oropharynx clear, no postnasal drip  Neck: No JVD, no TMG, no carotid bruits  Lungs: No use of accessory muscles, no dullness to percussion, clear without rales or rhonchi  Cardiovascular: RRR, heart sounds normal, no murmur or gallops, no peripheral edema  Abdomen: soft and NT, no HSM,  BS normal  Musculoskeletal: No deformities, no cyanosis or clubbing  Neuro: alert, non focal  Skin: Warm, no lesions  or rashes  Previous labs from January 2020 were reviewed  echo Study Conclusions   4/16 - Left ventricle: The cavity size was moderately dilated. Wall   thickness was normal. Systolic function was mildly to moderately   reduced. The estimated ejection fraction  was in the range of 40%   to 45%. Diffuse hypokinesis. Akinesis of the basalinferior   myocardium. - Left atrium: The atrium was mildly dilated.  Results of the Epworth flowsheet 01/08/2020 11/21/2013  Sitting and reading 2 3  Watching TV 3 3  Sitting, inactive in a public place (e.g. a theatre or a meeting) 0 2  As a passenger in a car for an hour without a break 2 3  Lying down to rest in the afternoon when circumstances permit 3 3  Sitting and talking to someone 0 0  Sitting quietly after a lunch without alcohol 3 2  In a car, while stopped for a few minutes in traffic 0 1  Total score 13 17        Assessment & Plan:  I personally reviewed all images and lab data in the [facility] system as well as any outside material available during this office visit and agree with the  radiology impressions.   No problem-specific Assessment & Plan notes found for this encounter.   There are no diagnoses linked to this encounter.

## 2021-03-05 DIAGNOSIS — J019 Acute sinusitis, unspecified: Secondary | ICD-10-CM | POA: Diagnosis not present

## 2021-03-05 DIAGNOSIS — J209 Acute bronchitis, unspecified: Secondary | ICD-10-CM | POA: Diagnosis not present

## 2021-03-19 ENCOUNTER — Other Ambulatory Visit: Payer: Self-pay

## 2021-06-30 ENCOUNTER — Encounter: Payer: Self-pay | Admitting: Family Medicine

## 2021-06-30 ENCOUNTER — Other Ambulatory Visit: Payer: Self-pay

## 2021-06-30 ENCOUNTER — Ambulatory Visit (INDEPENDENT_AMBULATORY_CARE_PROVIDER_SITE_OTHER): Payer: BC Managed Care – PPO | Admitting: Family Medicine

## 2021-06-30 VITALS — BP 155/106 | HR 78 | Temp 98.0°F | Ht 71.0 in | Wt >= 6400 oz

## 2021-06-30 DIAGNOSIS — G4733 Obstructive sleep apnea (adult) (pediatric): Secondary | ICD-10-CM

## 2021-06-30 DIAGNOSIS — E782 Mixed hyperlipidemia: Secondary | ICD-10-CM | POA: Diagnosis not present

## 2021-06-30 DIAGNOSIS — Z7689 Persons encountering health services in other specified circumstances: Secondary | ICD-10-CM

## 2021-06-30 DIAGNOSIS — R7303 Prediabetes: Secondary | ICD-10-CM | POA: Diagnosis not present

## 2021-06-30 DIAGNOSIS — I10 Essential (primary) hypertension: Secondary | ICD-10-CM | POA: Diagnosis not present

## 2021-06-30 DIAGNOSIS — E559 Vitamin D deficiency, unspecified: Secondary | ICD-10-CM

## 2021-06-30 DIAGNOSIS — K219 Gastro-esophageal reflux disease without esophagitis: Secondary | ICD-10-CM

## 2021-06-30 DIAGNOSIS — Z6841 Body Mass Index (BMI) 40.0 and over, adult: Secondary | ICD-10-CM

## 2021-06-30 DIAGNOSIS — M5442 Lumbago with sciatica, left side: Secondary | ICD-10-CM | POA: Diagnosis not present

## 2021-06-30 DIAGNOSIS — I5022 Chronic systolic (congestive) heart failure: Secondary | ICD-10-CM

## 2021-06-30 MED ORDER — AMLODIPINE BESYLATE 10 MG PO TABS: ORAL_TABLET | Freq: Every day | ORAL | 0 refills | Status: DC

## 2021-06-30 MED ORDER — CYCLOBENZAPRINE HCL 10 MG PO TABS
ORAL_TABLET | Freq: Three times a day (TID) | ORAL | 0 refills | Status: DC | PRN
Start: 2021-06-30 — End: 2022-05-16

## 2021-06-30 MED ORDER — VITAMIN D (ERGOCALCIFEROL) 1.25 MG (50000 UNIT) PO CAPS: 50000.0000 [IU] | ORAL_CAPSULE | ORAL | 6 refills | Status: DC

## 2021-06-30 MED ORDER — SPIRONOLACTONE 25 MG PO TABS: ORAL_TABLET | Freq: Every day | ORAL | 0 refills | Status: DC

## 2021-06-30 MED ORDER — METFORMIN HCL 500 MG PO TABS: ORAL_TABLET | Freq: Every day | ORAL | 0 refills | Status: DC

## 2021-06-30 MED ORDER — ATORVASTATIN CALCIUM 40 MG PO TABS: 40.0000 mg | ORAL_TABLET | Freq: Every day | ORAL | 0 refills | Status: DC

## 2021-06-30 MED ORDER — VALSARTAN 80 MG PO TABS: 80.0000 mg | ORAL_TABLET | Freq: Every day | ORAL | 0 refills | Status: DC

## 2021-06-30 MED ORDER — CETIRIZINE HCL 10 MG PO TABS: 10.0000 mg | ORAL_TABLET | Freq: Every day | ORAL | 0 refills | Status: DC

## 2021-06-30 MED ORDER — ALBUTEROL SULFATE HFA 108 (90 BASE) MCG/ACT IN AERS: 2.0000 | INHALATION_SPRAY | Freq: Four times a day (QID) | RESPIRATORY_TRACT | 0 refills | Status: DC | PRN

## 2021-06-30 MED ORDER — FLUTICASONE PROPIONATE HFA 220 MCG/ACT IN AERO: 2.0000 | INHALATION_SPRAY | Freq: Every day | RESPIRATORY_TRACT | 0 refills | Status: DC

## 2021-06-30 MED ORDER — CARVEDILOL 25 MG PO TABS
ORAL_TABLET | Freq: Two times a day (BID) | ORAL | 0 refills | Status: DC
Start: 2021-06-30 — End: 2023-02-10

## 2021-06-30 MED ORDER — OMEPRAZOLE 20 MG PO CPDR: 20.0000 mg | DELAYED_RELEASE_CAPSULE | Freq: Every day | ORAL | 0 refills | Status: DC

## 2021-06-30 NOTE — Progress Notes (Signed)
Patient is new to provider Patient would like a refill on medication Patient is concern about his asthma

## 2021-07-01 NOTE — Progress Notes (Signed)
New Patient Office Visit  Subjective:  Patient ID: Leonard Lewis, male    DOB: October 14, 1980  Age: 40 y.o. MRN: [id number]  CC:  Chief Complaint  Patient presents with   Establish Care   Asthma   Medication Refill    HPI [name] presents for to establish care.  Patient reports multiple chronic medical issues.  He reports that he has run out of his medications since and has not had them for several weeks.  Past Medical History:  Diagnosis Date   Arthritis    Asthma    Back pain    CHF (congestive heart failure) ([place]) 2008   EF 40-50%         Fatty liver    Hypertension    Leg edema    Obesity, morbid ([place])    Obstructive sleep apnea    Prediabetes     Past Surgical History:  Procedure Laterality Date   CARDIAC CATHETERIZATION  Oct 2013   cardiac cath negative for obstructive disease -- did show end diastolic pressure secondary to systemic hypertension   LAPAROSCOPIC GASTRIC SLEEVE RESECTION N/A 01/27/2015   Procedure: LAPAROSCOPIC GASTRIC SLEEVE RESECTION WITH UPPER ENDOSCOPY;  Surgeon: Johnathan Hausen, MD;  Location: [facility];  Service: General;  Laterality: N/A;   LEFT HEART CATHETERIZATION WITH CORONARY ANGIOGRAM N/A 08/03/2012   Procedure: LEFT HEART CATHETERIZATION WITH CORONARY ANGIOGRAM;  Surgeon: Sherren Mocha, MD;  Location: [facility] CATH LAB;  Service: Cardiovascular;  Laterality: N/A;   NO PAST SURGERIES      Family History  Problem Relation Age of Onset   Hypertension Mother    Obesity Mother    Hypertension Father     Social History   Socioeconomic History   Marital status: Married    Spouse name: Not on file   Number of children: Not on file   Years of education: Not on file   Highest education level: Not on file  Occupational History   Occupation: Orthoptist  Tobacco Use   Smoking status: Former    Types: Cigarettes    Quit date: 10/04/2006    Years since quitting: 14.7   Smokeless tobacco: Never  Substance and Sexual  Activity   Alcohol use: No   Drug use: No   Sexual activity: Not on file  Other Topics Concern   Not on file  Social History Narrative   Employed:  [employer].    Social Determinants of Health   Financial Resource Strain: Not on file  Food Insecurity: Not on file  Transportation Needs: Not on file  Physical Activity: Not on file  Stress: Not on file  Social Connections: Not on file  Intimate Partner Violence: Not on file    ROS Review of Systems  Constitutional:  Negative for chills and fever.  Respiratory:  Positive for shortness of breath and wheezing. Negative for chest tightness.   Cardiovascular:  Negative for chest pain.  All other systems reviewed and are negative.  Objective:   Today's Vitals: BP (!) 155/106 (BP Location: Right Wrist, Patient Position: Sitting, Cuff Size: Large)   Pulse 78   Temp 98 F (36.7 C) (Oral)   Ht 5' 11"  (1.803 m)   Wt (!) 434 lb 12.8 oz (197.2 kg)   SpO2 95%   BMI 60.64 kg/m   Physical Exam Vitals and nursing note reviewed.  Constitutional:      General: He is not in acute distress.    Appearance: He is obese.  Cardiovascular:     Rate and Rhythm: Normal rate and regular rhythm.  Pulmonary:     Effort: Pulmonary effort is normal. No respiratory distress.     Breath sounds: Normal breath sounds.  Abdominal:     Palpations: Abdomen is soft.     Tenderness: There is no abdominal tenderness.  Musculoskeletal:     Right lower leg: No edema.     Left lower leg: No edema.  Neurological:     General: No focal deficit present.     Mental Status: He is alert and oriented to person, place, and time.    Assessment & Plan:   1. HYPERTENSION, BENIGN ESSENTIAL Elevated reading.  Meds were refilled.  Will monitor. - amLODipine (NORVASC) 10 MG tablet; TAKE 1 TABLET (10 MG TOTAL) BY MOUTH DAILY.  Dispense: 90 tablet; Refill: 0 - carvedilol (COREG) 25 MG tablet; TAKE 1 TABLET (25 MG TOTAL) BY MOUTH 2 (TWO) TIMES DAILY.   Dispense: 180 tablet; Refill: 0 - valsartan (DIOVAN) 80 MG tablet; Take 1 tablet (80 mg total) by mouth daily.  Dispense: 90 tablet; Refill: 0  2. Mixed hyperlipidemia Meds refilled.  Continue present management. - atorvastatin (LIPITOR) 40 MG tablet; Take 1 tablet (40 mg total) by mouth daily.  Dispense: 90 tablet; Refill: 0  3. Acute left-sided low back pain with left-sided sciatica Discussed weight loss.  Exercises given.  Flexeril refilled/prescribed. - cyclobenzaprine (FLEXERIL) 10 MG tablet; TAKE 1 TABLET (10 MG TOTAL) BY MOUTH 3 (THREE) TIMES DAILY AS NEEDED FOR MUSCLE SPASMS.  Dispense: 30 tablet; Refill: 0  4. Prediabetes Metformin was refilled.  Continue present management and monitor. - metFORMIN (GLUCOPHAGE) 500 MG tablet; TAKE 1 TABLET (500 MG TOTAL) BY MOUTH DAILY WITH BREAKFAST.  Dispense: 90 tablet; Refill: 0  5. Gastroesophageal reflux disease without esophagitis Omeprazole was refilled. - omeprazole (PRILOSEC) 20 MG capsule; Take 1 capsule (20 mg total) by mouth daily.  Dispense: 90 capsule; Refill: 0  6. Chronic systolic heart failure (HCC) Spironolactone was refilled. - spironolactone (ALDACTONE) 25 MG tablet; TAKE 1 TABLET (25 MG TOTAL) BY MOUTH DAILY.  Dispense: 90 tablet; Refill: 0  7. Vitamin D deficiency Vitamin D was refilled. - Vitamin D, Ergocalciferol, (DRISDOL) 1.25 MG (50000 UNIT) CAPS capsule; Take 1 capsule (50,000 Units total) by mouth every 7 (seven) days.  Dispense: 12 capsule; Refill: 6  8. Class 3 severe obesity due to excess calories with serious comorbidity and body mass index (BMI) of 60.0 to 69.9 in adult [facility]) Discussed dietary and activity options.  Goal for this patient is 5 to 8 pounds weight loss per month for the next 3 months.  9. Obstructive Sleep Apnea Discussed compliance.  Patient to begin with CPAP machine.  Patient is already in possession of 1.  Will monitor.  10. Encounter to establish care     Outpatient Encounter  Medications as of 06/30/2021  Medication Sig   albuterol (VENTOLIN HFA) 108 (90 Base) MCG/ACT inhaler Inhale 2 puffs into the lungs every 6 (six) hours as needed for wheezing or shortness of breath.   aspirin EC 81 MG tablet Take 81 mg by mouth daily.   cetirizine (ZYRTEC) 10 MG tablet Take 1 tablet (10 mg total) by mouth daily.   Diclofenac Sodium (PENNSAID) 2 % SOLN Place 1 application onto the skin 2 (two) times daily.   ENTRESTO 49-51 MG TAKE 1 TABLET BY MOUTH 2 (TWO) TIMES DAILY.   FARXIGA 10 MG TABS tablet Take 1 tablet (10 mg  total) by mouth daily.   fluticasone (FLONASE) 50 MCG/ACT nasal spray Place 2 sprays into both nostrils daily.   fluticasone (FLOVENT HFA) 220 MCG/ACT inhaler Inhale 2 puffs into the lungs daily.   hydrALAZINE (APRESOLINE) 25 MG tablet TAKE 2 TABLETS (50 MG TOTAL) BY MOUTH 3 (THREE) TIMES DAILY.   valsartan (DIOVAN) 80 MG tablet Take 1 tablet (80 mg total) by mouth daily.   [DISCONTINUED] amLODipine (NORVASC) 10 MG tablet TAKE 1 TABLET (10 MG TOTAL) BY MOUTH DAILY.   [DISCONTINUED] atorvastatin (LIPITOR) 40 MG tablet TAKE 1 TABLET BY MOUTH EVERY DAY   [DISCONTINUED] carvedilol (COREG) 25 MG tablet TAKE 1 TABLET (25 MG TOTAL) BY MOUTH 2 (TWO) TIMES DAILY.   [DISCONTINUED] cyclobenzaprine (FLEXERIL) 10 MG tablet TAKE 1 TABLET (10 MG TOTAL) BY MOUTH 3 (THREE) TIMES DAILY AS NEEDED FOR MUSCLE SPASMS.   [DISCONTINUED] metFORMIN (GLUCOPHAGE) 500 MG tablet TAKE 1 TABLET (500 MG TOTAL) BY MOUTH DAILY WITH BREAKFAST.   [DISCONTINUED] omeprazole (PRILOSEC) 20 MG capsule Take 1 capsule (20 mg total) by mouth daily.   [DISCONTINUED] spironolactone (ALDACTONE) 25 MG tablet TAKE 1 TABLET (25 MG TOTAL) BY MOUTH DAILY.   [DISCONTINUED] Vitamin D, Ergocalciferol, (DRISDOL) 1.25 MG (50000 UNIT) CAPS capsule Take 1 capsule (50,000 Units total) by mouth every 7 (seven) days.   amLODipine (NORVASC) 10 MG tablet TAKE 1 TABLET (10 MG TOTAL) BY MOUTH DAILY.   atorvastatin (LIPITOR) 40 MG  tablet Take 1 tablet (40 mg total) by mouth daily.   carvedilol (COREG) 25 MG tablet TAKE 1 TABLET (25 MG TOTAL) BY MOUTH 2 (TWO) TIMES DAILY.   cyclobenzaprine (FLEXERIL) 10 MG tablet TAKE 1 TABLET (10 MG TOTAL) BY MOUTH 3 (THREE) TIMES DAILY AS NEEDED FOR MUSCLE SPASMS.   metFORMIN (GLUCOPHAGE) 500 MG tablet TAKE 1 TABLET (500 MG TOTAL) BY MOUTH DAILY WITH BREAKFAST.   omeprazole (PRILOSEC) 20 MG capsule Take 1 capsule (20 mg total) by mouth daily.   spironolactone (ALDACTONE) 25 MG tablet TAKE 1 TABLET (25 MG TOTAL) BY MOUTH DAILY.   Vitamin D, Ergocalciferol, (DRISDOL) 1.25 MG (50000 UNIT) CAPS capsule Take 1 capsule (50,000 Units total) by mouth every 7 (seven) days.   No facility-administered encounter medications on file as of 06/30/2021.    Follow-up: Return in about 4 weeks (around 07/28/2021) for follow up.   Becky Sax, MD

## 2021-07-09 ENCOUNTER — Other Ambulatory Visit: Payer: Self-pay | Admitting: Family Medicine

## 2021-07-09 MED ORDER — NIRMATRELVIR/RITONAVIR (PAXLOVID) TABLET (RENAL DOSING): 2.0000 | ORAL_TABLET | Freq: Two times a day (BID) | ORAL | 0 refills | Status: AC

## 2021-07-29 ENCOUNTER — Other Ambulatory Visit: Payer: Self-pay

## 2021-07-29 ENCOUNTER — Ambulatory Visit: Payer: BC Managed Care – PPO | Admitting: Family Medicine

## 2021-07-29 ENCOUNTER — Encounter: Payer: Self-pay | Admitting: Family Medicine

## 2021-07-29 VITALS — BP 151/100 | HR 83 | Temp 98.0°F | Resp 20 | Wt >= 6400 oz

## 2021-07-29 DIAGNOSIS — G4733 Obstructive sleep apnea (adult) (pediatric): Secondary | ICD-10-CM

## 2021-07-29 DIAGNOSIS — I10 Essential (primary) hypertension: Secondary | ICD-10-CM | POA: Diagnosis not present

## 2021-07-29 DIAGNOSIS — M25561 Pain in right knee: Secondary | ICD-10-CM | POA: Diagnosis not present

## 2021-07-29 DIAGNOSIS — Z6841 Body Mass Index (BMI) 40.0 and over, adult: Secondary | ICD-10-CM

## 2021-07-29 DIAGNOSIS — E66813 Obesity, class 3: Secondary | ICD-10-CM

## 2021-07-29 MED ORDER — CARETOUCH CPAP MASK WIPES MISC: 3 refills | Status: AC

## 2021-07-29 MED ORDER — VALSARTAN 160 MG PO TABS: 160.0000 mg | ORAL_TABLET | Freq: Every day | ORAL | 0 refills | Status: DC

## 2021-07-29 NOTE — Progress Notes (Signed)
Established Patient Office Visit  Subjective:  Patient ID: Leonard Lewis, male    DOB: May 24, 1981  Age: 40 y.o. MRN: [id number]  CC:  Chief Complaint  Patient presents with   Follow-up   Hypertension    HPI Leonard Lewis presents for complaint of right knee pain. He reports also that it feels intermittently unstable when he walks. He denies known trauma or injury.   Past Medical History:  Diagnosis Date   Arthritis    Asthma    Back pain    CHF (congestive heart failure) ([place]) 2008   EF 40-50%         Fatty liver    Hypertension    Leg edema    Obesity, morbid ([place])    Obstructive sleep apnea    Prediabetes     Past Surgical History:  Procedure Laterality Date   CARDIAC CATHETERIZATION  Oct 2013   cardiac cath negative for obstructive disease -- did show end diastolic pressure secondary to systemic hypertension   LAPAROSCOPIC GASTRIC SLEEVE RESECTION N/A 01/27/2015   Procedure: LAPAROSCOPIC GASTRIC SLEEVE RESECTION WITH UPPER ENDOSCOPY;  Surgeon: Johnathan Hausen, MD;  Location: [facility];  Service: General;  Laterality: N/A;   LEFT HEART CATHETERIZATION WITH CORONARY ANGIOGRAM N/A 08/03/2012   Procedure: LEFT HEART CATHETERIZATION WITH CORONARY ANGIOGRAM;  Surgeon: Sherren Mocha, MD;  Location: [facility] CATH LAB;  Service: Cardiovascular;  Laterality: N/A;   NO PAST SURGERIES      Family History  Problem Relation Age of Onset   Hypertension Mother    Obesity Mother    Hypertension Father     Social History   Socioeconomic History   Marital status: Married    Spouse name: Not on file   Number of children: Not on file   Years of education: Not on file   Highest education level: Not on file  Occupational History   Occupation: Orthoptist  Tobacco Use   Smoking status: Former    Types: Cigarettes    Quit date: 10/04/2006    Years since quitting: 14.8   Smokeless tobacco: Never  Substance and Sexual Activity   Alcohol use: No   Drug use: No    Sexual activity: Not on file  Other Topics Concern   Not on file  Social History Narrative   Employed:  Services [employer].    Social Determinants of Health   Financial Resource Strain: Not on file  Food Insecurity: Not on file  Transportation Needs: Not on file  Physical Activity: Not on file  Stress: Not on file  Social Connections: Not on file  Intimate Partner Violence: Not on file    ROS Review of Systems  All other systems reviewed and are negative.  Objective:   Today's Vitals: BP (!) 151/100 (BP Location: Right Wrist, Patient Position: Sitting, Cuff Size: Large)   Pulse 83   Temp 98 F (36.7 C) (Oral)   Resp 20   Wt (!) 436 lb (197.8 kg)   SpO2 95%   BMI 60.81 kg/m   Physical Exam Vitals and nursing note reviewed.  Constitutional:      General: He is not in acute distress.    Appearance: He is obese.  Cardiovascular:     Rate and Rhythm: Normal rate and regular rhythm.  Pulmonary:     Effort: Pulmonary effort is normal. No respiratory distress.     Breath sounds: Normal breath sounds.  Abdominal:     Palpations: Abdomen is soft.  Tenderness: There is no abdominal tenderness.  Musculoskeletal:     Right knee: No swelling or deformity. Normal range of motion. Tenderness present.     Right lower leg: No edema.     Left lower leg: No edema.  Neurological:     General: No focal deficit present.     Mental Status: He is alert and oriented to person, place, and time.    Assessment & Plan:   1. Right knee pain, unspecified chronicity Referral to PT for further eval/mgt - Ambulatory referral to Physical Therapy  2. Essential hypertension Elevated reading. Will increase valsartan from 80 to 160 mg daily and monitor  3. Class 3 severe obesity due to excess calories with serious comorbidity and body mass index (BMI) of 60.0 to 69.9 in adult [facility]) Discussed dietary options. Discussed importance of weight loss to aid hypertension and right knee pain  and OSA control and management.  4. Obstructive sleep apnea Prescription for CPAP mask from [facility].     Outpatient Encounter Medications as of 07/29/2021  Medication Sig   albuterol (VENTOLIN HFA) 108 (90 Base) MCG/ACT inhaler Inhale 2 puffs into the lungs every 6 (six) hours as needed for wheezing or shortness of breath.   amLODipine (NORVASC) 10 MG tablet TAKE 1 TABLET (10 MG TOTAL) BY MOUTH DAILY.   aspirin EC 81 MG tablet Take 81 mg by mouth daily.   atorvastatin (LIPITOR) 40 MG tablet Take 1 tablet (40 mg total) by mouth daily.   carvedilol (COREG) 25 MG tablet TAKE 1 TABLET (25 MG TOTAL) BY MOUTH 2 (TWO) TIMES DAILY.   cetirizine (ZYRTEC) 10 MG tablet Take 1 tablet (10 mg total) by mouth daily.   cyclobenzaprine (FLEXERIL) 10 MG tablet TAKE 1 TABLET (10 MG TOTAL) BY MOUTH 3 (THREE) TIMES DAILY AS NEEDED FOR MUSCLE SPASMS.   Diclofenac Sodium (PENNSAID) 2 % SOLN Place 1 application onto the skin 2 (two) times daily.   ENTRESTO 49-51 MG TAKE 1 TABLET BY MOUTH 2 (TWO) TIMES DAILY.   FARXIGA 10 MG TABS tablet Take 1 tablet (10 mg total) by mouth daily.   fluticasone (FLONASE) 50 MCG/ACT nasal spray Place 2 sprays into both nostrils daily.   fluticasone (FLOVENT HFA) 220 MCG/ACT inhaler Inhale 2 puffs into the lungs daily.   hydrALAZINE (APRESOLINE) 25 MG tablet TAKE 2 TABLETS (50 MG TOTAL) BY MOUTH 3 (THREE) TIMES DAILY.   metFORMIN (GLUCOPHAGE) 500 MG tablet TAKE 1 TABLET (500 MG TOTAL) BY MOUTH DAILY WITH BREAKFAST.   omeprazole (PRILOSEC) 20 MG capsule Take 1 capsule (20 mg total) by mouth daily.   Respiratory Therapy Supplies (CARETOUCH CPAP MASK WIPES) MISC For cpap machine   spironolactone (ALDACTONE) 25 MG tablet TAKE 1 TABLET (25 MG TOTAL) BY MOUTH DAILY.   valsartan (DIOVAN) 160 MG tablet Take 1 tablet (160 mg total) by mouth daily.   Vitamin D, Ergocalciferol, (DRISDOL) 1.25 MG (50000 UNIT) CAPS capsule Take 1 capsule (50,000 Units total) by mouth every 7 (seven) days.    [DISCONTINUED] valsartan (DIOVAN) 80 MG tablet Take 1 tablet (80 mg total) by mouth daily.   No facility-administered encounter medications on file as of 07/29/2021.    Follow-up: Return in about 4 weeks (around 08/26/2021) for follow up.   Becky Sax, MD

## 2021-07-29 NOTE — Progress Notes (Signed)
Patient is still having trouble with right knee. Patient is afraid his knee is going to give away  backwards 9/10 pain today

## 2021-08-05 ENCOUNTER — Ambulatory Visit: Payer: Self-pay | Admitting: Family Medicine

## 2021-08-11 ENCOUNTER — Ambulatory Visit: Payer: BC Managed Care – PPO

## 2021-08-25 ENCOUNTER — Ambulatory Visit: Payer: BC Managed Care – PPO | Attending: Family Medicine

## 2021-08-25 ENCOUNTER — Other Ambulatory Visit: Payer: Self-pay

## 2021-08-25 DIAGNOSIS — M6281 Muscle weakness (generalized): Secondary | ICD-10-CM | POA: Diagnosis not present

## 2021-08-25 DIAGNOSIS — R262 Difficulty in walking, not elsewhere classified: Secondary | ICD-10-CM | POA: Insufficient documentation

## 2021-08-25 DIAGNOSIS — M25561 Pain in right knee: Secondary | ICD-10-CM | POA: Insufficient documentation

## 2021-08-26 NOTE — Therapy (Addendum)
[place], [place], [address] Phone: [phone]   Fax:  [phone]  Physical Therapy Evaluation/Discharge  Patient Details  Name: Leonard Lewis MRN: [id number] Date of Birth: 1980-11-08 Referring Provider (PT): Dorna Mai, MD   Encounter Date: 08/25/2021   PT End of Session - 08/26/21 0809     Visit Number 1    Number of Visits 9    Date for PT Re-Evaluation 10/31/21    Authorization Type BCBS COMM PPO    PT Start Time 3329    PT Stop Time 1640    PT Time Calculation (min) 50 min    Activity Tolerance Patient tolerated treatment well    Behavior During Therapy [facility] for tasks assessed/performed             Past Medical History:  Diagnosis Date   Arthritis    Asthma    Back pain    CHF (congestive heart failure) ([place]) 2008   EF 40-50%         Fatty liver    Hypertension    Leg edema    Obesity, morbid ([place])    Obstructive sleep apnea    Prediabetes     Past Surgical History:  Procedure Laterality Date   CARDIAC CATHETERIZATION  Oct 2013   cardiac cath negative for obstructive disease -- did show end diastolic pressure secondary to systemic hypertension   LAPAROSCOPIC GASTRIC SLEEVE RESECTION N/A 01/27/2015   Procedure: LAPAROSCOPIC GASTRIC SLEEVE RESECTION WITH UPPER ENDOSCOPY;  Surgeon: Johnathan Hausen, MD;  Location: [facility];  Service: General;  Laterality: N/A;   LEFT HEART CATHETERIZATION WITH CORONARY ANGIOGRAM N/A 08/03/2012   Procedure: LEFT HEART CATHETERIZATION WITH CORONARY ANGIOGRAM;  Surgeon: Sherren Mocha, MD;  Location[facility] CATH LAB;  Service: Cardiovascular;  Laterality: N/A;   NO PAST SURGERIES      There were no vitals filed for this visit.    Subjective Assessment - 08/25/21 1603     Subjective Pt reports his R knee pain started the beginning of October without a known MOI. In the past 2 weeks it has been feeling better, but not 100%.    Pertinent History  CHF, morbid obesity, arthritis, back pain, OSA, prediabetes    Limitations Standing;Walking    How long can you sit comfortably? 30 mins    How long can you stand comfortably? 15 mins    How long can you walk comfortably? 15 moins    Diagnostic tests NA    Patient Stated Goals For the R knee to feel better    Currently in Pain? Yes    Pain Score 0-No pain   0-6/10   Pain Location Knee    Pain Orientation Right    Pain Descriptors / Indicators Sharp;Throbbing    Pain Type Chronic pain    Pain Onset More than a month ago    Pain Frequency Intermittent    Aggravating Factors  sitting, walking, standing    Pain Relieving Factors Meds                [facility] PT Assessment - 08/26/21 0001       Assessment   Medical Diagnosis Right knee pain, unspecified chronicity    Referring Provider (PT) Dorna Mai, MD    Onset Date/Surgical Date --   approx 07/04/2021   Prior Therapy no      Precautions   Precautions None      Restrictions   Weight Bearing Restrictions  No      Balance Screen   Has the patient fallen in the past 6 months Yes    How many times? 1   trip on c curb in the dark   Has the patient had a decrease in activity level because of a fear of falling?  No    Is the patient reluctant to leave their home because of a fear of falling?  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   Vocation Requirements Standing and turning      Cognition   Overall Cognitive Status Within Functional Limits for tasks assessed      Observation/Other Assessments   Observations Sits with hips abd and ER    Focus on Therapeutic Outcomes (FOTO)  69% functional ability and 75% predicted at DC      Observation/Other Assessments-Edema    Edema --   Pt denies currently     Sensation   Light Touch Appears Intact      ROM  / Strength   AROM / PROM / Strength AROM;Strength      AROM   Overall AROM Comments WNLS both knees      Strength   Strength Assessment Site Hip;Knee    Right/Left Hip Right;Left    Right Hip Flexion 3/5    Right Hip Extension 3+/5    Right Hip External Rotation  3+/5    Right Hip Internal Rotation 4+/5    Right Hip ABduction 4+/5    Right Hip ADduction 5/5    Left Hip Flexion 4/5    Left Hip Extension 4+/5    Left Hip External Rotation 4+/5    Left Hip Internal Rotation 5/5    Left Hip ABduction 4+/5    Left Hip ADduction 5/5    Right/Left Knee Right;Left    Right Knee Flexion 4+/5    Right Knee Extension 4+/5    Left Knee Flexion 5/5    Left Knee Extension 5/5      Palpation   Palpation comment medial superior aspect of the r patella      Special Tests    Special Tests Knee Special Tests    Knee Special tests  Patellofemoral Apprehension Test;Patellofemoral Grind Test (Clarke's Sign)      Patellofemoral Apprehension Test    Findings Positive    Side  Right      Patellofemoral Grind test (Clark's Sign)   Findings Postive    Side  Right      Transfers   Transfers Sit to Stand;Stand to Sit    Sit to Stand 7: Independent      Ambulation/Gait   Ambulation/Gait Yes    Ambulation Distance (Feet) 150 Feet    Gait Pattern Step-through pattern   Out toeing bilat, decreased heel strike bilat, pt experienced DOE with gait of 171f                       Objective measurements completed on examination: See above findings.   [place] Treatment/Exercise:  Therapeutic Exercise: - LAQ, x10, R LE - SLR c quad set, x10, R LE - hip abd, x10, R LE - S/L clam, x10, R LE - prone hip ext, x10, RLE  PT Education - 08/26/21 0809     Education Details PT eval, POC, HEP, staggered stance for off loading R knee in prolonged standing, avoid sitting with knees flexed less than 90 degrees, RICE for pain and swelling management     Person(s) Educated Patient    Methods Explanation;Demonstration;Tactile cues;Verbal cues;Handout    Comprehension Returned demonstration;Verbalized understanding;Verbal cues required;Tactile cues required              PT Short Term Goals - 08/26/21 0849       PT SHORT TERM GOAL #1   Title Pt will be Ind in an initial HEP.    Baseline started on eval    Status New    Target Date 09/16/21      PT SHORT TERM GOAL #2   Title Pt will voice understanding of measures to assist in the management of his R knee pain.    Status New    Target Date 09/16/21               PT Long Term Goals - 08/26/21 0903       PT LONG TERM GOAL #1   Title Pt will be Ind in a final HEP to maintain achieved LOF    Status New    Target Date 10/31/21      PT LONG TERM GOAL #2   Title Increase R hip and knee strength to 4+/5 and 5/5 respestively to address pain and function.    Baseline see flowsheets    Status New    Target Date 10/31/21      PT LONG TERM GOAL #3   Title Pt will report improved tolerance of standing and walking to 45 mins for improved function    Baseline 15 mins eacg    Status New    Target Date 10/31/21      PT LONG TERM GOAL #4   Title Pt will report a decrease in R knee pain to 3/10 or less with daily and working related activities    Baseline 0-6/10    Status New    Target Date 10/31/21                    Plan - 08/25/21 1656     Clinical Impression Statement Pt presents to PT with R knee pain which appears most symptomatic of PFPS. Pt's deficits include decreased R LE strength and decreased tolerance to standing and walking. 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 A positive sign is pt reports his R knee pain seems to be improving. Pt was initiated on an HEP and received education to help with the management of pain. Pt will benefit from skilled pt to address deficits and pain  to optimize function of the r knee/LE and QOL.    Personal Factors and Comorbidities Past/Current Experience;Time since onset of injury/illness/exacerbation;Comorbidity 3+    Comorbidities CHF, morbid obesity, arthritis, back pain, OSA, prediabetes    Examination-Activity Limitations Locomotion Level;Stand;Squat    Examination-Participation Restrictions Occupation    Stability/Clinical Decision Making Stable/Uncomplicated    Clinical Decision Making Low    Rehab Potential Good    PT Frequency 1x / week    PT Duration 8 weeks    PT Treatment/Interventions ADLs/Self Care Home Management;Aquatic Therapy;Cryotherapy;Electrical Stimulation;Iontophoresis 75m/ml Dexamethasone;Moist Heat;Ultrasound;Neuromuscular re-education;Balance training;Therapeutic exercise;Therapeutic activities;Functional mobility training;Stair training;Gait training;Patient/family education;Manual techniques;Passive range of motion;Dry needling;Taping;Joint Manipulations    PT Next Visit  Plan Assess response to HEP, complete 2MWT and set goal, review FOTO, discuss aquatics as option for exercise and HEP    PT Home Exercise Plan [place] and Agree with Plan of Care Patient             Patient will benefit from skilled therapeutic intervention in order to improve the following deficits and impairments:  Difficulty walking, Decreased activity tolerance, Obesity, Pain, Decreased strength  Visit Diagnosis: Right knee pain, unspecified chronicity  Muscle weakness (generalized)  Difficulty in walking, not elsewhere classified     Problem List Patient Active Problem List   Diagnosis Date Noted   Acute left-sided low back pain with left-sided sciatica 11/06/2020   Gastroesophageal reflux disease without esophagitis 11/06/2020   Diarrhea 11/06/2020   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   Left foot pain 05/23/2020   Tonsillar hypertrophy 01/24/2020   Vitamin D deficiency 06/19/2019    Prediabetes 01/03/2018   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 Patient goals were not met. Patient is being discharged due to not returning since the last visit.  Gar Ponto MS, PT 11/06/21 8:30 AM    [place], [place], [address] Phone: [phone]   Fax:  [phone]  Name: Leonard Lewis MRN: [id number] Date of Birth: Apr 06, 1981

## 2021-08-31 DIAGNOSIS — J069 Acute upper respiratory infection, unspecified: Secondary | ICD-10-CM | POA: Diagnosis not present

## 2021-08-31 DIAGNOSIS — R051 Acute cough: Secondary | ICD-10-CM | POA: Diagnosis not present

## 2021-08-31 DIAGNOSIS — R0981 Nasal congestion: Secondary | ICD-10-CM | POA: Diagnosis not present

## 2021-08-31 DIAGNOSIS — Z03818 Encounter for observation for suspected exposure to other biological agents ruled out: Secondary | ICD-10-CM | POA: Diagnosis not present

## 2021-09-02 ENCOUNTER — Ambulatory Visit: Payer: BC Managed Care – PPO | Admitting: Family Medicine

## 2021-09-03 ENCOUNTER — Telehealth: Payer: Self-pay

## 2021-09-03 ENCOUNTER — Ambulatory Visit: Payer: BC Managed Care – PPO | Attending: Family Medicine

## 2021-09-03 NOTE — Telephone Encounter (Signed)
PT called and left voicemail regarding missed visit and reminder of attendance policy and next appointment.  Eloy End, PT, DPT 09/03/21 6:47 PM

## 2021-09-15 ENCOUNTER — Ambulatory Visit: Payer: BC Managed Care – PPO

## 2021-09-21 ENCOUNTER — Other Ambulatory Visit: Payer: Self-pay | Admitting: Family Medicine

## 2021-09-21 ENCOUNTER — Other Ambulatory Visit: Payer: Self-pay

## 2021-09-21 ENCOUNTER — Telehealth: Payer: Self-pay

## 2021-09-21 DIAGNOSIS — K219 Gastro-esophageal reflux disease without esophagitis: Secondary | ICD-10-CM

## 2021-09-21 MED ORDER — OMEPRAZOLE 20 MG PO CPDR: 20.0000 mg | DELAYED_RELEASE_CAPSULE | Freq: Every day | ORAL | 0 refills | Status: DC

## 2021-09-21 MED ORDER — OMEPRAZOLE 20 MG PO CPDR
20.0000 mg | DELAYED_RELEASE_CAPSULE | Freq: Every day | ORAL | 0 refills | Status: DC
  Filled 2021-09-21: qty 30, 30d supply, fill #0

## 2021-09-21 NOTE — Telephone Encounter (Signed)
PT had called and left voicemail regarding missed visit on 12/13.  Future visits have been cancelled except one, with reminder of attendance policy also left

## 2021-09-22 ENCOUNTER — Other Ambulatory Visit: Payer: Self-pay

## 2021-09-30 ENCOUNTER — Other Ambulatory Visit: Payer: Self-pay

## 2021-11-09 ENCOUNTER — Encounter: Payer: Self-pay | Admitting: Family Medicine

## 2021-11-09 ENCOUNTER — Other Ambulatory Visit: Payer: Self-pay

## 2021-11-09 ENCOUNTER — Ambulatory Visit: Payer: BC Managed Care – PPO | Admitting: Family Medicine

## 2021-11-09 VITALS — BP 159/117 | HR 84 | Temp 98.4°F | Resp 16 | Wt >= 6400 oz

## 2021-11-09 DIAGNOSIS — D649 Anemia, unspecified: Secondary | ICD-10-CM | POA: Diagnosis not present

## 2021-11-09 DIAGNOSIS — Z1329 Encounter for screening for other suspected endocrine disorder: Secondary | ICD-10-CM | POA: Diagnosis not present

## 2021-11-09 DIAGNOSIS — G4733 Obstructive sleep apnea (adult) (pediatric): Secondary | ICD-10-CM | POA: Diagnosis not present

## 2021-11-09 DIAGNOSIS — Z6841 Body Mass Index (BMI) 40.0 and over, adult: Secondary | ICD-10-CM

## 2021-11-09 DIAGNOSIS — K529 Noninfective gastroenteritis and colitis, unspecified: Secondary | ICD-10-CM

## 2021-11-09 DIAGNOSIS — I10 Essential (primary) hypertension: Secondary | ICD-10-CM | POA: Diagnosis not present

## 2021-11-09 DIAGNOSIS — Z13228 Encounter for screening for other metabolic disorders: Secondary | ICD-10-CM

## 2021-11-09 DIAGNOSIS — Z130 Encounter for screening for diseases of the blood and blood-forming organs and certain disorders involving the immune mechanism: Secondary | ICD-10-CM

## 2021-11-09 NOTE — Progress Notes (Signed)
Established Patient Office Visit  Subjective:  Patient ID: Leonard Lewis, male    DOB: 01-18-1981  Age: 41 y.o. MRN: [id number]  CC:  Chief Complaint  Patient presents with   Follow-up    HPI Leonard Lewis presents for for complaint of diarrhea over the past couple of days. Denies known contacts or exposures although he had been to a different restaurant. He also reports persistent fatigue and malaise for several weeks or months. Denies fever/chills. He reports that he has not been wearing his c-pap regularly.   Past Medical History:  Diagnosis Date   Arthritis    Asthma    Back pain    CHF (congestive heart failure) ([place]) 2008   EF 40-50%         Fatty liver    Hypertension    Leg edema    Obesity, morbid ([place])    Obstructive sleep apnea    Prediabetes     Past Surgical History:  Procedure Laterality Date   CARDIAC CATHETERIZATION  Oct 2013   cardiac cath negative for obstructive disease -- did show end diastolic pressure secondary to systemic hypertension   LAPAROSCOPIC GASTRIC SLEEVE RESECTION N/A 01/27/2015   Procedure: LAPAROSCOPIC GASTRIC SLEEVE RESECTION WITH UPPER ENDOSCOPY;  Surgeon: Johnathan Hausen, MD;  Location: [facility];  Service: General;  Laterality: N/A;   LEFT HEART CATHETERIZATION WITH CORONARY ANGIOGRAM N/A 08/03/2012   Procedure: LEFT HEART CATHETERIZATION WITH CORONARY ANGIOGRAM;  Surgeon: Sherren Mocha, MD;  Location: [facility] CATH LAB;  Service: Cardiovascular;  Laterality: N/A;   NO PAST SURGERIES      Family History  Problem Relation Age of Onset   Hypertension Mother    Obesity Mother    Hypertension Father     Social History   Socioeconomic History   Marital status: Married    Spouse name: Not on file   Number of children: Not on file   Years of education: Not on file   Highest education level: Not on file  Occupational History   Occupation: Orthoptist  Tobacco Use   Smoking status: Former    Types: Cigarettes    Quit  date: 10/04/2006    Years since quitting: 15.1   Smokeless tobacco: Never  Substance and Sexual Activity   Alcohol use: No   Drug use: No   Sexual activity: Not on file  Other Topics Concern   Not on file  Social History Narrative   Employed:  [employer].    Social Determinants of Health   Financial Resource Strain: Not on file  Food Insecurity: Not on file  Transportation Needs: Not on file  Physical Activity: Not on file  Stress: Not on file  Social Connections: Not on file  Intimate Partner Violence: Not on file    ROS Review of Systems  Constitutional:  Positive for fatigue. Negative for chills and fever.  Respiratory:  Negative for shortness of breath.   Gastrointestinal:  Positive for diarrhea.  All other systems reviewed and are negative.  Objective:   Today's Vitals: BP (!) 159/117    Pulse 84    Temp 98.4 F (36.9 C) (Oral)    Resp 16    Wt (!) 420 lb (190.5 kg)    SpO2 96%    BMI 58.58 kg/m   Physical Exam Vitals and nursing note reviewed.  Constitutional:      General: He is not in acute distress.    Appearance: He is obese.  Cardiovascular:  Rate and Rhythm: Normal rate and regular rhythm.  Pulmonary:     Effort: Pulmonary effort is normal. No respiratory distress.     Breath sounds: Normal breath sounds.  Abdominal:     Palpations: Abdomen is soft.     Tenderness: There is no abdominal tenderness.  Musculoskeletal:     Right lower leg: No edema.     Left lower leg: No edema.  Neurological:     General: No focal deficit present.     Mental Status: He is alert and oriented to person, place, and time.    Assessment & Plan:   1. Uncontrolled hypertension Discussed compliance. Will continue present management and monitor. Monitoring labs ordered.  - CMP14+EGFR - Lipid Panel  2. Noninfectious gastroenteritis, unspecified type Improving per patient. Recommend adequate fluids. Clear liquids and advance as tolerated.  - CMP14+EGFR -  CBC with Differential  3. Class 3 severe obesity due to excess calories with serious comorbidity and body mass index (BMI) of 50.0 to 59.9 in adult [facility]) Discussed dietary and activity options. Goal is 4-6lbs/mo weight loss.   4. Obstructive sleep apnea Compliance discussed. Patient encouraged to wear c-pap as recommended.   5. Anemia, unspecified type Results pending.  - Iron, TIBC and Ferritin Panel  6. Screening for endocrine/metabolic/immunity disorders Increasing A1c - no prediabetic - discussed dietary and activity options.  - Hemoglobin A1c   Outpatient Encounter Medications as of 11/09/2021  Medication Sig   albuterol (VENTOLIN HFA) 108 (90 Base) MCG/ACT inhaler Inhale 2 puffs into the lungs every 6 (six) hours as needed for wheezing or shortness of breath.   amLODipine (NORVASC) 10 MG tablet TAKE 1 TABLET (10 MG TOTAL) BY MOUTH DAILY.   aspirin 81 MG EC tablet Take by mouth.   aspirin EC 81 MG tablet Take 81 mg by mouth daily.   atorvastatin (LIPITOR) 40 MG tablet Take 1 tablet (40 mg total) by mouth daily.   carvedilol (COREG) 25 MG tablet TAKE 1 TABLET (25 MG TOTAL) BY MOUTH 2 (TWO) TIMES DAILY.   cetirizine (ZYRTEC) 10 MG tablet Take 1 tablet (10 mg total) by mouth daily.   cyclobenzaprine (FLEXERIL) 10 MG tablet TAKE 1 TABLET (10 MG TOTAL) BY MOUTH 3 (THREE) TIMES DAILY AS NEEDED FOR MUSCLE SPASMS.   Diclofenac Sodium (PENNSAID) 2 % SOLN Place 1 application onto the skin 2 (two) times daily.   FARXIGA 10 MG TABS tablet Take 1 tablet (10 mg total) by mouth daily.   fluticasone (FLONASE) 50 MCG/ACT nasal spray Place 2 sprays into both nostrils daily.   fluticasone (FLOVENT HFA) 220 MCG/ACT inhaler Inhale 2 puffs into the lungs daily.   hydrALAZINE (APRESOLINE) 25 MG tablet TAKE 2 TABLETS (50 MG TOTAL) BY MOUTH 3 (THREE) TIMES DAILY.   metFORMIN (GLUCOPHAGE) 500 MG tablet TAKE 1 TABLET (500 MG TOTAL) BY MOUTH DAILY WITH BREAKFAST.   omeprazole (PRILOSEC) 20 MG capsule Take 1  capsule (20 mg total) by mouth daily.   Respiratory Therapy Supplies (CARETOUCH CPAP MASK WIPES) MISC For cpap machine   spironolactone (ALDACTONE) 25 MG tablet TAKE 1 TABLET (25 MG TOTAL) BY MOUTH DAILY.   valsartan (DIOVAN) 160 MG tablet Take 1 tablet (160 mg total) by mouth daily.   Vitamin D, Ergocalciferol, (DRISDOL) 1.25 MG (50000 UNIT) CAPS capsule Take 1 capsule (50,000 Units total) by mouth every 7 (seven) days.   No facility-administered encounter medications on file as of 11/09/2021.    Follow-up: No follow-ups on file.   Redmond Pulling,  Patricia Pesa, MD

## 2021-11-10 LAB — CMP14+EGFR
ALT: 15 IU/L (ref 0–44)
AST: 15 IU/L (ref 0–40)
Albumin/Globulin Ratio: 1.2 (ref 1.2–2.2)
Albumin: 3.8 g/dL — ABNORMAL LOW (ref 4.0–5.0)
Alkaline Phosphatase: 60 IU/L (ref 44–121)
BUN/Creatinine Ratio: 12 (ref 9–20)
BUN: 31 mg/dL — ABNORMAL HIGH (ref 6–24)
Bilirubin Total: 0.2 mg/dL (ref 0.0–1.2)
CO2: 23 mmol/L (ref 20–29)
Calcium: 9.1 mg/dL (ref 8.7–10.2)
Chloride: 101 mmol/L (ref 96–106)
Creatinine, Ser: 2.5 mg/dL — ABNORMAL HIGH (ref 0.76–1.27)
Globulin, Total: 3.3 g/dL (ref 1.5–4.5)
Glucose: 102 mg/dL — ABNORMAL HIGH (ref 70–99)
Potassium: 4 mmol/L (ref 3.5–5.2)
Sodium: 142 mmol/L (ref 134–144)
Total Protein: 7.1 g/dL (ref 6.0–8.5)
eGFR: 32 mL/min/{1.73_m2} — ABNORMAL LOW (ref >59)

## 2021-11-10 LAB — CBC WITH DIFFERENTIAL/PLATELET
Basophils Absolute: 0 10*3/uL (ref 0.0–0.2)
Basos: 0 %
EOS (ABSOLUTE): 0.1 10*3/uL (ref 0.0–0.4)
Eos: 2 %
Hematocrit: 36.6 % — ABNORMAL LOW (ref 37.5–51.0)
Hemoglobin: 11.8 g/dL — ABNORMAL LOW (ref 13.0–17.7)
Immature Grans (Abs): 0 10*3/uL (ref 0.0–0.1)
Immature Granulocytes: 0 %
Lymphocytes Absolute: 2.7 10*3/uL (ref 0.7–3.1)
Lymphs: 42 %
MCH: 22.1 pg — ABNORMAL LOW (ref 26.6–33.0)
MCHC: 32.2 g/dL (ref 31.5–35.7)
MCV: 69 fL — ABNORMAL LOW (ref 79–97)
Monocytes Absolute: 0.8 10*3/uL (ref 0.1–0.9)
Monocytes: 12 %
Neutrophils Absolute: 2.8 10*3/uL (ref 1.4–7.0)
Neutrophils: 44 %
Platelets: 302 10*3/uL (ref 150–450)
RBC: 5.34 x10E6/uL (ref 4.14–5.80)
RDW: 16.1 % — ABNORMAL HIGH (ref 11.6–15.4)
WBC: 6.4 10*3/uL (ref 3.4–10.8)

## 2021-11-10 LAB — LIPID PANEL
Chol/HDL Ratio: 4.1 ratio (ref 0.0–5.0)
Cholesterol, Total: 107 mg/dL (ref 100–199)
HDL: 26 mg/dL — ABNORMAL LOW (ref >39)
LDL Chol Calc (NIH): 58 mg/dL (ref 0–99)
Triglycerides: 127 mg/dL (ref 0–149)
VLDL Cholesterol Cal: 23 mg/dL (ref 5–40)

## 2021-11-10 LAB — HEMOGLOBIN A1C
Est. average glucose Bld gHb Est-mCnc: 128 mg/dL
Hgb A1c MFr Bld: 6.1 % — ABNORMAL HIGH (ref 4.8–5.6)

## 2021-11-11 ENCOUNTER — Encounter: Payer: Self-pay | Admitting: Family Medicine

## 2021-12-01 ENCOUNTER — Ambulatory Visit: Payer: BC Managed Care – PPO | Admitting: Family Medicine

## 2021-12-06 ENCOUNTER — Other Ambulatory Visit: Payer: Self-pay | Admitting: Family Medicine

## 2021-12-06 DIAGNOSIS — E782 Mixed hyperlipidemia: Secondary | ICD-10-CM

## 2021-12-07 MED ORDER — CETIRIZINE HCL 10 MG PO TABS
10.0000 mg | ORAL_TABLET | Freq: Every day | ORAL | 0 refills | Status: DC
Start: 2021-12-07 — End: 2022-05-16

## 2021-12-07 MED ORDER — ATORVASTATIN CALCIUM 40 MG PO TABS: 40.0000 mg | ORAL_TABLET | Freq: Every day | ORAL | 0 refills | Status: DC

## 2021-12-07 MED ORDER — VALSARTAN 160 MG PO TABS: 160.0000 mg | ORAL_TABLET | Freq: Every day | ORAL | 0 refills | Status: DC

## 2021-12-09 ENCOUNTER — Other Ambulatory Visit: Payer: Self-pay

## 2021-12-09 ENCOUNTER — Ambulatory Visit (INDEPENDENT_AMBULATORY_CARE_PROVIDER_SITE_OTHER): Payer: BC Managed Care – PPO | Admitting: Family Medicine

## 2021-12-09 ENCOUNTER — Encounter: Payer: Self-pay | Admitting: Family Medicine

## 2021-12-09 VITALS — BP 152/98 | HR 78 | Temp 98.3°F | Resp 16 | Wt >= 6400 oz

## 2021-12-09 DIAGNOSIS — Z6841 Body Mass Index (BMI) 40.0 and over, adult: Secondary | ICD-10-CM

## 2021-12-09 DIAGNOSIS — F329 Major depressive disorder, single episode, unspecified: Secondary | ICD-10-CM | POA: Diagnosis not present

## 2021-12-09 DIAGNOSIS — I10 Essential (primary) hypertension: Secondary | ICD-10-CM

## 2021-12-09 DIAGNOSIS — G4733 Obstructive sleep apnea (adult) (pediatric): Secondary | ICD-10-CM

## 2021-12-09 DIAGNOSIS — Z3169 Encounter for other general counseling and advice on procreation: Secondary | ICD-10-CM

## 2021-12-09 MED ORDER — SERTRALINE HCL 100 MG PO TABS: 100.0000 mg | ORAL_TABLET | Freq: Every day | ORAL | 1 refills | Status: DC

## 2021-12-09 NOTE — Progress Notes (Signed)
Patient is here to talk with provider about having a lot of fatigue. Patient  also c/o right knee pain that has been going on x 2days 5/10 pain. Patient is not using his CPAP machine like he should be using. Patient said that he feels like he is suffocations  ?

## 2021-12-10 ENCOUNTER — Encounter: Payer: Self-pay | Admitting: Family Medicine

## 2021-12-10 NOTE — Progress Notes (Signed)
? ?Established Patient Office Visit ? ?Subjective:  ?Patient ID: Leonard Lewis, male    DOB: May 31, 1981  Age: 41 y.o. MRN: [id number] ? ?CC:  ?Chief Complaint  ?Patient presents with  ? Follow-up  ? Hyperlipidemia  ? Diabetes  ? Hypertension  ? Fatigue  ? Obesity  ? ? ?HPI ?Leonard Lewis presents for follow up of chronic med issues. Patient reports persistent fatigue. Patient also reports minimal compliance with meds and cpap machine.  ? ?Past Medical History:  ?Diagnosis Date  ? Arthritis   ? Asthma   ? Back pain   ? CHF (congestive heart failure) ([place]) 2008  ? EF 40-50%        ? Fatty liver   ? Hypertension   ? Leg edema   ? Obesity, morbid ([place])   ? Obstructive sleep apnea   ? Prediabetes   ? ? ?Past Surgical History:  ?Procedure Laterality Date  ? CARDIAC CATHETERIZATION  Oct 2013  ? cardiac cath negative for obstructive disease -- did show end diastolic pressure secondary to systemic hypertension  ? LAPAROSCOPIC GASTRIC SLEEVE RESECTION N/A 01/27/2015  ? Procedure: LAPAROSCOPIC GASTRIC SLEEVE RESECTION WITH UPPER ENDOSCOPY;  Surgeon: Johnathan Hausen, MD;  Location: [facility];  Service: General;  Laterality: N/A;  ? LEFT HEART CATHETERIZATION WITH CORONARY ANGIOGRAM N/A 08/03/2012  ? Procedure: LEFT HEART CATHETERIZATION WITH CORONARY ANGIOGRAM;  Surgeon: Sherren Mocha, MD;  Location: [facility] CATH LAB;  Service: Cardiovascular;  Laterality: N/A;  ? NO PAST SURGERIES    ? ? ?Family History  ?Problem Relation Age of Onset  ? Hypertension Mother   ? Obesity Mother   ? Hypertension Father   ? ? ?Social History  ? ?Socioeconomic History  ? Marital status: Married  ?  Spouse name: Not on file  ? Number of children: Not on file  ? Years of education: Not on file  ? Highest education level: Not on file  ?Occupational History  ? Occupation: Orthoptist  ?Tobacco Use  ? Smoking status: Former  ?  Types: Cigarettes  ?  Quit date: 10/04/2006  ?  Years since quitting: 15.1  ? Smokeless tobacco: Never  ?Substance and  Sexual Activity  ? Alcohol use: No  ? Drug use: No  ? Sexual activity: Not on file  ?Other Topics Concern  ? Not on file  ?Social History Narrative  ? 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   ? ?Social Determinants of Health  ? ?Financial Resource Strain: Not on file  ?Food Insecurity: Not on file  ?Transportation Needs: Not on file  ?Physical Activity: Not on file  ?Stress: Not on file  ?Social Connections: Not on file  ?Intimate Partner Violence: Not on file  ? ? ?ROS ?Review of Systems  ?Constitutional:  Positive for fatigue. Negative for chills and fever.  ?Respiratory:  Negative for shortness of breath.   ?Cardiovascular: Negative.   ?All other systems reviewed and are negative. ? ?Objective:  ? ?Today's Vitals: BP (!) 152/98   Pulse 78   Temp 98.3 ?F (36.8 ?C) (Oral)   Resp 16   Wt (!) 432 lb 3.2 oz (196 kg)   SpO2 96%   BMI 60.28 kg/m?  ? ?Physical Exam ?Vitals and nursing note reviewed.  ?Constitutional:   ?   General: He is not in acute distress. ?   Appearance: He is obese.  ?Cardiovascular:  ?   Rate and Rhythm: Normal rate and regular rhythm.  ?Pulmonary:  ?   Effort:  Pulmonary effort is normal. No respiratory distress.  ?   Breath sounds: Normal breath sounds.  ?Abdominal:  ?   Palpations: Abdomen is soft.  ?   Tenderness: There is no abdominal tenderness.  ?Musculoskeletal:  ?   Right lower leg: No edema.  ?   Left lower leg: No edema.  ?Neurological:  ?   General: No focal deficit present.  ?   Mental Status: He is alert and oriented to person, place, and time.  ?Psychiatric:     ?   Mood and Affect: Affect is flat.     ?   Speech: Speech normal.     ?   Behavior: Behavior is slowed. Behavior is cooperative.  ? ? ?Assessment & Plan:  ? ?1. Uncontrolled hypertension ?Discussed compliance. Will continue present management and monitor ? ?2. Obstructive sleep apnea ?Discussed compliance patient to start utilizing cpap regularly as recommended.  ? ?3. Class 3 severe obesity due to excess calories with  serious comorbidity and body mass index (BMI) of 60.0 to 69.9 in adult [facility]) ?Referral to weight management.  ?- Amb Ref to Medical Weight Management ? ?4. Reactive depression ?Will prescribe zoloft 100 mg daily and monitor ? ?5. Infertility counseling ?Referral to consultant for further eval/mgt ?- Ambulatory referral to Urology ? ? ? ?Outpatient Encounter Medications as of 12/09/2021  ?Medication Sig  ? albuterol (VENTOLIN HFA) 108 (90 Base) MCG/ACT inhaler Inhale 2 puffs into the lungs every 6 (six) hours as needed for wheezing or shortness of breath.  ? amLODipine (NORVASC) 10 MG tablet Take 1 tablet by mouth daily.  ? aspirin 81 MG EC tablet Take by mouth.  ? aspirin EC 81 MG tablet Take 81 mg by mouth daily.  ? atorvastatin (LIPITOR) 40 MG tablet Take 1 tablet by mouth daily.  ? carvedilol (COREG) 25 MG tablet TAKE 1 TABLET (25 MG TOTAL) BY MOUTH 2 (TWO) TIMES DAILY.  ? cetirizine (ZYRTEC) 10 MG tablet Take 1 tablet (10 mg total) by mouth daily.  ? cyclobenzaprine (FLEXERIL) 10 MG tablet TAKE 1 TABLET (10 MG TOTAL) BY MOUTH 3 (THREE) TIMES DAILY AS NEEDED FOR MUSCLE SPASMS.  ? Diclofenac Sodium (PENNSAID) 2 % SOLN Place 1 application onto the skin 2 (two) times daily.  ? FARXIGA 10 MG TABS tablet Take 1 tablet (10 mg total) by mouth daily.  ? fluticasone (FLONASE) 50 MCG/ACT nasal spray Place into the nose.  ? fluticasone (FLOVENT HFA) 220 MCG/ACT inhaler Inhale 2 puffs into the lungs daily.  ? metFORMIN (GLUCOPHAGE) 500 MG tablet TAKE 1 TABLET (500 MG TOTAL) BY MOUTH DAILY WITH BREAKFAST.  ? omeprazole (PRILOSEC) 20 MG capsule Take 1 capsule by mouth daily.  ? Respiratory Therapy Supplies (CARETOUCH CPAP MASK WIPES) MISC For cpap machine  ? sertraline (ZOLOFT) 100 MG tablet Take 1 tablet (100 mg total) by mouth daily.  ? spironolactone (ALDACTONE) 25 MG tablet TAKE 1 TABLET (25 MG TOTAL) BY MOUTH DAILY.  ? valsartan (DIOVAN) 160 MG tablet Take 1 tablet (160 mg total) by mouth daily.  ? Vitamin D,  Ergocalciferol, (DRISDOL) 1.25 MG (50000 UNIT) CAPS capsule Take 1 capsule (50,000 Units total) by mouth every 7 (seven) days.  ? amLODipine (NORVASC) 10 MG tablet TAKE 1 TABLET (10 MG TOTAL) BY MOUTH DAILY. (Patient not taking: Reported on 12/09/2021)  ? atorvastatin (LIPITOR) 40 MG tablet Take 1 tablet (40 mg total) by mouth daily. (Patient not taking: Reported on 12/09/2021)  ? fluticasone (FLONASE) 50 MCG/ACT nasal spray  Place 2 sprays into both nostrils daily. (Patient not taking: Reported on 12/09/2021)  ? hydrALAZINE (APRESOLINE) 25 MG tablet TAKE 2 TABLETS (50 MG TOTAL) BY MOUTH 3 (THREE) TIMES DAILY.  ? metFORMIN (GLUCOPHAGE) 500 MG tablet Take by mouth. (Patient not taking: Reported on 12/09/2021)  ? omeprazole (PRILOSEC) 20 MG capsule Take 1 capsule (20 mg total) by mouth daily. (Patient not taking: Reported on 12/09/2021)  ? ?No facility-administered encounter medications on file as of 12/09/2021.  ? ? ?Follow-up: Return in about 4 weeks (around 01/06/2022) for follow up, chronic med issues.  ? ?Becky Sax, MD ? ?

## 2022-01-06 ENCOUNTER — Ambulatory Visit: Payer: BC Managed Care – PPO | Admitting: Family Medicine

## 2022-01-13 ENCOUNTER — Ambulatory Visit (INDEPENDENT_AMBULATORY_CARE_PROVIDER_SITE_OTHER): Payer: BC Managed Care – PPO

## 2022-01-13 ENCOUNTER — Ambulatory Visit (INDEPENDENT_AMBULATORY_CARE_PROVIDER_SITE_OTHER): Payer: BC Managed Care – PPO | Admitting: Family Medicine

## 2022-01-13 VITALS — BP 146/97 | HR 80 | Temp 98.3°F | Resp 16 | Wt >= 6400 oz

## 2022-01-13 DIAGNOSIS — M79672 Pain in left foot: Secondary | ICD-10-CM

## 2022-01-13 DIAGNOSIS — I10 Essential (primary) hypertension: Secondary | ICD-10-CM | POA: Diagnosis not present

## 2022-01-13 DIAGNOSIS — R053 Chronic cough: Secondary | ICD-10-CM

## 2022-01-13 DIAGNOSIS — R059 Cough, unspecified: Secondary | ICD-10-CM | POA: Diagnosis not present

## 2022-01-13 MED ORDER — AZITHROMYCIN 250 MG PO TABS: ORAL_TABLET | ORAL | 0 refills | Status: AC

## 2022-01-13 MED ORDER — VALSARTAN 320 MG PO TABS: 320.0000 mg | ORAL_TABLET | Freq: Every day | ORAL | 0 refills | Status: DC

## 2022-01-13 NOTE — Progress Notes (Signed)
Patient is here with c/o cough for 3 days with yellow/white mucus. Patient believe it to be bronchitis. Pt said that he maybe also have arthritis in his left foot during cold/rain weather  ?

## 2022-01-14 ENCOUNTER — Encounter: Payer: Self-pay | Admitting: Family Medicine

## 2022-01-14 LAB — BASIC METABOLIC PANEL
BUN/Creatinine Ratio: 13 (ref 9–20)
BUN: 22 mg/dL (ref 6–24)
CO2: 25 mmol/L (ref 20–29)
Calcium: 9.3 mg/dL (ref 8.7–10.2)
Chloride: 103 mmol/L (ref 96–106)
Creatinine, Ser: 1.76 mg/dL — ABNORMAL HIGH (ref 0.76–1.27)
Glucose: 93 mg/dL (ref 70–99)
Potassium: 4.1 mmol/L (ref 3.5–5.2)
Sodium: 139 mmol/L (ref 134–144)
eGFR: 49 mL/min/{1.73_m2} — ABNORMAL LOW (ref >59)

## 2022-01-14 LAB — CBC WITH DIFFERENTIAL/PLATELET
Basophils Absolute: 0.1 10*3/uL (ref 0.0–0.2)
Basos: 1 %
EOS (ABSOLUTE): 0.3 10*3/uL (ref 0.0–0.4)
Eos: 6 %
Hematocrit: 38.6 % (ref 37.5–51.0)
Hemoglobin: 12.2 g/dL — ABNORMAL LOW (ref 13.0–17.7)
Immature Grans (Abs): 0 10*3/uL (ref 0.0–0.1)
Immature Granulocytes: 0 %
Lymphocytes Absolute: 1.3 10*3/uL (ref 0.7–3.1)
Lymphs: 28 %
MCH: 21.8 pg — ABNORMAL LOW (ref 26.6–33.0)
MCHC: 31.6 g/dL (ref 31.5–35.7)
MCV: 69 fL — ABNORMAL LOW (ref 79–97)
Monocytes Absolute: 0.8 10*3/uL (ref 0.1–0.9)
Monocytes: 17 %
Neutrophils Absolute: 2.3 10*3/uL (ref 1.4–7.0)
Neutrophils: 48 %
Platelets: 305 10*3/uL (ref 150–450)
RBC: 5.6 x10E6/uL (ref 4.14–5.80)
RDW: 15.6 % — ABNORMAL HIGH (ref 11.6–15.4)
WBC: 4.8 10*3/uL (ref 3.4–10.8)

## 2022-01-14 LAB — URIC ACID: Uric Acid: 10.7 mg/dL — ABNORMAL HIGH (ref 3.8–8.4)

## 2022-01-14 NOTE — Progress Notes (Signed)
? ?Established Patient Office Visit ? ?Subjective:  ?Patient ID: Leonard Lewis, male    DOB: 10/17/1980  Age: 41 y.o. MRN: [id number] ? ?CC:  ?Chief Complaint  ?Patient presents with  ? Arthritis  ? Cough  ? ? ?HPI ?Leonard Lewis presents for complaint of cough for about 1 months. Denies fever. Cough has occasionally been productive. Patient denies fever/chills. Patient denies known contacts or exposures. Patient also reports left foot pain and reports possible history in the past of gout.  ? ?Past Medical History:  ?Diagnosis Date  ? Arthritis   ? Asthma   ? Back pain   ? CHF (congestive heart failure) ([place]) 2008  ? EF 40-50%        ? Fatty liver   ? Hypertension   ? Leg edema   ? Obesity, morbid ([place])   ? Obstructive sleep apnea   ? Prediabetes   ? ? ?Past Surgical History:  ?Procedure Laterality Date  ? CARDIAC CATHETERIZATION  Oct 2013  ? cardiac cath negative for obstructive disease -- did show end diastolic pressure secondary to systemic hypertension  ? LAPAROSCOPIC GASTRIC SLEEVE RESECTION N/A 01/27/2015  ? Procedure: LAPAROSCOPIC GASTRIC SLEEVE RESECTION WITH UPPER ENDOSCOPY;  Surgeon: Johnathan Hausen, MD;  Location: [facility];  Service: General;  Laterality: N/A;  ? LEFT HEART CATHETERIZATION WITH CORONARY ANGIOGRAM N/A 08/03/2012  ? Procedure: LEFT HEART CATHETERIZATION WITH CORONARY ANGIOGRAM;  Surgeon: Sherren Mocha, MD;  [facility] CATH LAB;  Service: Cardiovascular;  Laterality: N/A;  ? NO PAST SURGERIES    ? ? ?Family History  ?Problem Relation Age of Onset  ? Hypertension Mother   ? Obesity Mother   ? Hypertension Father   ? ? ?Social History  ? ?Socioeconomic History  ? Marital status: Married  ?  Spouse name: Not on file  ? Number of children: Not on file  ? Years of education: Not on file  ? Highest education level: Not on file  ?Occupational History  ? Occupation: Orthoptist  ?Tobacco Use  ? Smoking status: Former  ?  Types: Cigarettes  ?  Quit date: 10/04/2006  ?  Years since  quitting: 15.2  ? Smokeless tobacco: Never  ?Substance and Sexual Activity  ? Alcohol use: No  ? Drug use: No  ? Sexual activity: Not on file  ?Other Topics Concern  ? Not on file  ?Social History Narrative  ? 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   ? ?Social Determinants of Health  ? ?Financial Resource Strain: Not on file  ?Food Insecurity: Not on file  ?Transportation Needs: Not on file  ?Physical Activity: Not on file  ?Stress: Not on file  ?Social Connections: Not on file  ?Intimate Partner Violence: Not on file  ? ? ?ROS ?Review of Systems  ?All other systems reviewed and are negative. ? ?Objective:  ? ?Today's Vitals: BP (!) 146/97   Pulse 80   Temp 98.3 ?F (36.8 ?C) (Oral)   Resp 16   Wt (!) 420 lb 9.6 oz (190.8 kg)   SpO2 96%   BMI 58.66 kg/m?  ? ?Physical Exam ?Vitals and nursing note reviewed.  ?Constitutional:   ?   General: He is not in acute distress. ?   Appearance: He is obese.  ?Cardiovascular:  ?   Rate and Rhythm: Normal rate and regular rhythm.  ?Pulmonary:  ?   Effort: Pulmonary effort is normal. No respiratory distress.  ?   Breath sounds: Normal breath sounds.  ?Abdominal:  ?  Palpations: Abdomen is soft.  ?   Tenderness: There is no abdominal tenderness.  ?Musculoskeletal:  ?   Right lower leg: No edema.  ?   Left lower leg: No edema.  ?Neurological:  ?   General: No focal deficit present.  ?   Mental Status: He is alert and oriented to person, place, and time.  ?Psychiatric:     ?   Mood and Affect: Mood and affect normal.     ?   Speech: Speech normal.     ?   Behavior: Behavior is slowed. Behavior is cooperative.  ? ? ?Assessment & Plan:  ? ?1. Persistent cough for 3 weeks or longer ?Xray and labs ordered. Zithromax prescribed ?- Basic Metabolic Panel ?- CBC with Differential ?- DG Chest 2 View; Future ? ?2. Left foot pain ?Labs ordered. Tylenol/nsaids prn ?- Uric Acid ? ?3. Essential hypertension ?Readings remain elevated. Will increase valsartan from 160 to 320 mg daily and  monitor ? ? ? ?Outpatient Encounter Medications as of 01/13/2022  ?Medication Sig  ? azithromycin (ZITHROMAX) 250 MG tablet Take 2 tablets on day 1, then 1 tablet daily on days 2 through 5  ? valsartan (DIOVAN) 320 MG tablet Take 1 tablet (320 mg total) by mouth daily.  ? albuterol (VENTOLIN HFA) 108 (90 Base) MCG/ACT inhaler Inhale 2 puffs into the lungs every 6 (six) hours as needed for wheezing or shortness of breath.  ? amLODipine (NORVASC) 10 MG tablet TAKE 1 TABLET (10 MG TOTAL) BY MOUTH DAILY. (Patient not taking: Reported on 12/09/2021)  ? amLODipine (NORVASC) 10 MG tablet Take 1 tablet by mouth daily.  ? aspirin 81 MG EC tablet Take by mouth.  ? aspirin EC 81 MG tablet Take 81 mg by mouth daily.  ? atorvastatin (LIPITOR) 40 MG tablet Take 1 tablet (40 mg total) by mouth daily. (Patient not taking: Reported on 12/09/2021)  ? atorvastatin (LIPITOR) 40 MG tablet Take 1 tablet by mouth daily.  ? carvedilol (COREG) 25 MG tablet TAKE 1 TABLET (25 MG TOTAL) BY MOUTH 2 (TWO) TIMES DAILY.  ? cetirizine (ZYRTEC) 10 MG tablet Take 1 tablet (10 mg total) by mouth daily.  ? cyclobenzaprine (FLEXERIL) 10 MG tablet TAKE 1 TABLET (10 MG TOTAL) BY MOUTH 3 (THREE) TIMES DAILY AS NEEDED FOR MUSCLE SPASMS.  ? Diclofenac Sodium (PENNSAID) 2 % SOLN Place 1 application onto the skin 2 (two) times daily.  ? FARXIGA 10 MG TABS tablet Take 1 tablet (10 mg total) by mouth daily.  ? fluticasone (FLONASE) 50 MCG/ACT nasal spray Place 2 sprays into both nostrils daily. (Patient not taking: Reported on 12/09/2021)  ? fluticasone (FLONASE) 50 MCG/ACT nasal spray Place into the nose.  ? fluticasone (FLOVENT HFA) 220 MCG/ACT inhaler Inhale 2 puffs into the lungs daily.  ? hydrALAZINE (APRESOLINE) 25 MG tablet TAKE 2 TABLETS (50 MG TOTAL) BY MOUTH 3 (THREE) TIMES DAILY.  ? metFORMIN (GLUCOPHAGE) 500 MG tablet TAKE 1 TABLET (500 MG TOTAL) BY MOUTH DAILY WITH BREAKFAST.  ? metFORMIN (GLUCOPHAGE) 500 MG tablet Take by mouth. (Patient not taking:  Reported on 12/09/2021)  ? omeprazole (PRILOSEC) 20 MG capsule Take 1 capsule (20 mg total) by mouth daily. (Patient not taking: Reported on 12/09/2021)  ? omeprazole (PRILOSEC) 20 MG capsule Take 1 capsule by mouth daily.  ? Respiratory Therapy Supplies (CARETOUCH CPAP MASK WIPES) MISC For cpap machine  ? sertraline (ZOLOFT) 100 MG tablet Take 1 tablet (100 mg total) by mouth daily.  ?  spironolactone (ALDACTONE) 25 MG tablet TAKE 1 TABLET (25 MG TOTAL) BY MOUTH DAILY.  ? valsartan (DIOVAN) 160 MG tablet Take 1 tablet (160 mg total) by mouth daily.  ? Vitamin D, Ergocalciferol, (DRISDOL) 1.25 MG (50000 UNIT) CAPS capsule Take 1 capsule (50,000 Units total) by mouth every 7 (seven) days.  ? ?No facility-administered encounter medications on file as of 01/13/2022.  ? ? ?Follow-up: No follow-ups on file.  ? ?Becky Sax, MD ? ?

## 2022-01-19 ENCOUNTER — Encounter: Payer: Self-pay | Admitting: Family Medicine

## 2022-01-21 ENCOUNTER — Ambulatory Visit: Payer: BC Managed Care – PPO | Admitting: Family Medicine

## 2022-01-21 ENCOUNTER — Encounter: Payer: Self-pay | Admitting: Family Medicine

## 2022-01-21 VITALS — BP 112/74 | HR 99 | Temp 98.0°F | Resp 20 | Wt >= 6400 oz

## 2022-01-21 DIAGNOSIS — R053 Chronic cough: Secondary | ICD-10-CM | POA: Diagnosis not present

## 2022-01-21 DIAGNOSIS — R197 Diarrhea, unspecified: Secondary | ICD-10-CM | POA: Diagnosis not present

## 2022-01-21 MED ORDER — AMOXICILLIN 875 MG PO TABS: 875.0000 mg | ORAL_TABLET | Freq: Two times a day (BID) | ORAL | 0 refills | Status: AC

## 2022-01-21 MED ORDER — PREDNISONE 20 MG PO TABS: 60.0000 mg | ORAL_TABLET | Freq: Every day | ORAL | 0 refills | Status: DC

## 2022-01-21 NOTE — Progress Notes (Signed)
Patient c/o lower back pain, cough with gagging, dizziness, loss of appetite, and diarrhea  ? ?Patient said he has not been feeling well since he was on ABX for bronchitis ? ?

## 2022-01-22 ENCOUNTER — Encounter: Payer: Self-pay | Admitting: Family Medicine

## 2022-01-22 LAB — COVID-19, FLU A+B AND RSV
Influenza A, NAA: NOT DETECTED
Influenza B, NAA: NOT DETECTED
RSV, NAA: NOT DETECTED
SARS-CoV-2, NAA: NOT DETECTED

## 2022-01-22 NOTE — Progress Notes (Signed)
? ?Established Patient Office Visit ? ?Subjective   ? ?Patient ID: Leonard Lewis, male    DOB: [birth year]  Age: 41 y.o. MRN: [id number] ? ?CC:  ?Chief Complaint  ?Patient presents with  ? Diarrhea  ? Cough  ? Back Pain  ? ? ?HPI ?Leonard Moots presents with complaint of persistent bronchitis sx although he has taken course of zithromax. Patient also reports some diarrhea and decreased appetite. Patient denies fever/chills or known contacts or exposures.  ? ? ?Outpatient Encounter Medications as of 01/21/2022  ?Medication Sig  ? amoxicillin (AMOXIL) 875 MG tablet Take 1 tablet (875 mg total) by mouth 2 (two) times daily for 10 days.  ? predniSONE (DELTASONE) 20 MG tablet Take 3 tablets (60 mg total) by mouth daily with breakfast.  ? albuterol (VENTOLIN HFA) 108 (90 Base) MCG/ACT inhaler Inhale 2 puffs into the lungs every 6 (six) hours as needed for wheezing or shortness of breath.  ? amLODipine (NORVASC) 10 MG tablet TAKE 1 TABLET (10 MG TOTAL) BY MOUTH DAILY. (Patient not taking: Reported on 12/09/2021)  ? amLODipine (NORVASC) 10 MG tablet Take 1 tablet by mouth daily.  ? aspirin 81 MG EC tablet Take by mouth.  ? aspirin EC 81 MG tablet Take 81 mg by mouth daily.  ? atorvastatin (LIPITOR) 40 MG tablet Take 1 tablet (40 mg total) by mouth daily. (Patient not taking: Reported on 12/09/2021)  ? atorvastatin (LIPITOR) 40 MG tablet Take 1 tablet by mouth daily.  ? carvedilol (COREG) 25 MG tablet TAKE 1 TABLET (25 MG TOTAL) BY MOUTH 2 (TWO) TIMES DAILY.  ? cetirizine (ZYRTEC) 10 MG tablet Take 1 tablet (10 mg total) by mouth daily.  ? cyclobenzaprine (FLEXERIL) 10 MG tablet TAKE 1 TABLET (10 MG TOTAL) BY MOUTH 3 (THREE) TIMES DAILY AS NEEDED FOR MUSCLE SPASMS.  ? Diclofenac Sodium (PENNSAID) 2 % SOLN Place 1 application onto the skin 2 (two) times daily.  ? FARXIGA 10 MG TABS tablet Take 1 tablet (10 mg total) by mouth daily.  ? fluticasone (FLONASE) 50 MCG/ACT nasal spray Place 2 sprays into both nostrils daily. (Patient  not taking: Reported on 12/09/2021)  ? fluticasone (FLONASE) 50 MCG/ACT nasal spray Place into the nose.  ? fluticasone (FLOVENT HFA) 220 MCG/ACT inhaler Inhale 2 puffs into the lungs daily.  ? hydrALAZINE (APRESOLINE) 25 MG tablet TAKE 2 TABLETS (50 MG TOTAL) BY MOUTH 3 (THREE) TIMES DAILY.  ? metFORMIN (GLUCOPHAGE) 500 MG tablet TAKE 1 TABLET (500 MG TOTAL) BY MOUTH DAILY WITH BREAKFAST.  ? metFORMIN (GLUCOPHAGE) 500 MG tablet Take by mouth. (Patient not taking: Reported on 12/09/2021)  ? omeprazole (PRILOSEC) 20 MG capsule Take 1 capsule (20 mg total) by mouth daily. (Patient not taking: Reported on 12/09/2021)  ? omeprazole (PRILOSEC) 20 MG capsule Take 1 capsule by mouth daily.  ? Respiratory Therapy Supplies (CARETOUCH CPAP MASK WIPES) MISC For cpap machine  ? sertraline (ZOLOFT) 100 MG tablet Take 1 tablet (100 mg total) by mouth daily.  ? spironolactone (ALDACTONE) 25 MG tablet TAKE 1 TABLET (25 MG TOTAL) BY MOUTH DAILY.  ? valsartan (DIOVAN) 160 MG tablet Take 1 tablet (160 mg total) by mouth daily.  ? valsartan (DIOVAN) 320 MG tablet Take 1 tablet (320 mg total) by mouth daily.  ? Vitamin D, Ergocalciferol, (DRISDOL) 1.25 MG (50000 UNIT) CAPS capsule Take 1 capsule (50,000 Units total) by mouth every 7 (seven) days.  ? ?No facility-administered encounter medications on file as of 01/21/2022.  ? ? ?  Past Medical History:  ?Diagnosis Date  ? Arthritis   ? Asthma   ? Back pain   ? CHF (congestive heart failure) ([place]) 2008  ? EF 40-50%        ? Fatty liver   ? Hypertension   ? Leg edema   ? Obesity, morbid ([place])   ? Obstructive sleep apnea   ? Prediabetes   ? ? ?Past Surgical History:  ?Procedure Laterality Date  ? CARDIAC CATHETERIZATION  Oct 2013  ? cardiac cath negative for obstructive disease -- did show end diastolic pressure secondary to systemic hypertension  ? LAPAROSCOPIC GASTRIC SLEEVE RESECTION N/A 01/27/2015  ? Procedure: LAPAROSCOPIC GASTRIC SLEEVE RESECTION WITH UPPER ENDOSCOPY;  Surgeon: Johnathan Hausen,  MD;  Location: [facility];  Service: General;  Laterality: N/A;  ? LEFT HEART CATHETERIZATION WITH CORONARY ANGIOGRAM N/A 08/03/2012  ? Procedure: LEFT HEART CATHETERIZATION WITH CORONARY ANGIOGRAM;  Surgeon: Sherren Mocha, MD;  Location: [facility] CATH LAB;  Service: Cardiovascular;  Laterality: N/A;  ? NO PAST SURGERIES    ? ? ?Family History  ?Problem Relation Age of Onset  ? Hypertension Mother   ? Obesity Mother   ? Hypertension Father   ? ? ?Social History  ? ?Socioeconomic History  ? Marital status: Married  ?  Spouse name: Not on file  ? Number of children: Not on file  ? Years of education: Not on file  ? Highest education level: Not on file  ?Occupational History  ? Occupation: Orthoptist  ?Tobacco Use  ? Smoking status: Former  ?  Types: Cigarettes  ?  Quit date: 10/04/2006  ?  Years since quitting: 15.3  ? Smokeless tobacco: Never  ?Substance and Sexual Activity  ? Alcohol use: No  ? Drug use: No  ? Sexual activity: Not on file  ?Other Topics Concern  ? Not on file  ?Social History Narrative  ? 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   ? ?Social Determinants of Health  ? ?Financial Resource Strain: Not on file  ?Food Insecurity: Not on file  ?Transportation Needs: Not on file  ?Physical Activity: Not on file  ?Stress: Not on file  ?Social Connections: Not on file  ?Intimate Partner Violence: Not on file  ? ? ?Review of Systems  ?Constitutional:  Positive for malaise/fatigue. Negative for chills and fever.  ?Respiratory:  Positive for cough and shortness of breath. Negative for hemoptysis and wheezing.   ?All other systems reviewed and are negative. ? ?  ? ? ?Objective   ? ?BP 112/74   Pulse 99   Temp 98 ?F (36.7 ?C) (Oral)   Resp 20   Wt (!) 410 lb 12.8 oz (186.3 kg)   SpO2 (!) 89%   BMI 57.29 kg/m?  ? ?Physical Exam ?Vitals and nursing note reviewed.  ?Constitutional:   ?   General: He is not in acute distress. ?   Appearance: He is obese.  ?Cardiovascular:  ?   Rate and Rhythm: Normal rate and  regular rhythm.  ?Pulmonary:  ?   Effort: Pulmonary effort is normal. No respiratory distress.  ?   Breath sounds: Normal breath sounds. No wheezing.  ?Abdominal:  ?   Palpations: Abdomen is soft.  ?   Tenderness: There is no abdominal tenderness.  ?Musculoskeletal:  ?   Right lower leg: No edema.  ?   Left lower leg: No edema.  ?Neurological:  ?   General: No focal deficit present.  ?   Mental Status: He is  alert and oriented to person, place, and time.  ?Psychiatric:     ?   Mood and Affect: Mood and affect normal.     ?   Speech: Speech normal.     ?   Behavior: Behavior is slowed. Behavior is cooperative.  ? ? ? ?  ? ?Assessment & Plan:  ? ?1. Persistent cough for 3 weeks or longer ?Covid testing pending. Abx changed to amox with prednisone prescribed. monitor ?- COVID-19, Flu A+B and RSV ? ?2. Diarrhea, unspecified type ??2/2 abx. Clear liquids and adv as tolerated.  ? ? ? ?No follow-ups on file.  ? ?Becky Sax, MD ? ? ?

## 2022-01-27 ENCOUNTER — Ambulatory Visit: Payer: BC Managed Care – PPO | Admitting: Family Medicine

## 2022-02-02 ENCOUNTER — Ambulatory Visit (INDEPENDENT_AMBULATORY_CARE_PROVIDER_SITE_OTHER): Payer: BC Managed Care – PPO | Admitting: Family Medicine

## 2022-02-02 VITALS — BP 151/100 | HR 75 | Temp 98.0°F | Resp 18 | Wt >= 6400 oz

## 2022-02-02 DIAGNOSIS — M25579 Pain in unspecified ankle and joints of unspecified foot: Secondary | ICD-10-CM

## 2022-02-02 DIAGNOSIS — R053 Chronic cough: Secondary | ICD-10-CM | POA: Diagnosis not present

## 2022-02-02 DIAGNOSIS — Z6841 Body Mass Index (BMI) 40.0 and over, adult: Secondary | ICD-10-CM

## 2022-02-02 DIAGNOSIS — I10 Essential (primary) hypertension: Secondary | ICD-10-CM

## 2022-02-02 NOTE — Progress Notes (Signed)
Patient is her for follow-up HTN, and persistent cough. ?Patient said that he is doing better and the coughing episode has gone. Patient said that he do have ankle issues from time to time due to his weight ?

## 2022-02-03 ENCOUNTER — Encounter: Payer: Self-pay | Admitting: Family Medicine

## 2022-02-03 NOTE — Progress Notes (Signed)
? ?Established Patient Office Visit ? ?Subjective   ? ?Patient ID: Leonard Lewis, male    DOB: 1981-03-22  Age: 41 y.o. MRN: [id number] ? ?CC:  ?Chief Complaint  ?Patient presents with  ? Follow-up  ? Cough  ? ? ?HPI ?[name] presents for follow up of persistent cough. Patient reports much improvement after second course of abx.  ? ? ?Outpatient Encounter Medications as of 02/02/2022  ?Medication Sig  ? amLODipine (NORVASC) 10 MG tablet TAKE 1 TABLET (10 MG TOTAL) BY MOUTH DAILY.  ? atorvastatin (LIPITOR) 40 MG tablet Take 1 tablet (40 mg total) by mouth daily.  ? albuterol (VENTOLIN HFA) 108 (90 Base) MCG/ACT inhaler Inhale 2 puffs into the lungs every 6 (six) hours as needed for wheezing or shortness of breath.  ? aspirin 81 MG EC tablet Take by mouth.  ? carvedilol (COREG) 25 MG tablet TAKE 1 TABLET (25 MG TOTAL) BY MOUTH 2 (TWO) TIMES DAILY.  ? cetirizine (ZYRTEC) 10 MG tablet Take 1 tablet (10 mg total) by mouth daily.  ? cyclobenzaprine (FLEXERIL) 10 MG tablet TAKE 1 TABLET (10 MG TOTAL) BY MOUTH 3 (THREE) TIMES DAILY AS NEEDED FOR MUSCLE SPASMS.  ? Diclofenac Sodium (PENNSAID) 2 % SOLN Place 1 application onto the skin 2 (two) times daily.  ? FARXIGA 10 MG TABS tablet Take 1 tablet (10 mg total) by mouth daily.  ? fluticasone (FLONASE) 50 MCG/ACT nasal spray Place 2 sprays into both nostrils daily. (Patient not taking: Reported on 12/09/2021)  ? fluticasone (FLOVENT HFA) 220 MCG/ACT inhaler Inhale 2 puffs into the lungs daily.  ? hydrALAZINE (APRESOLINE) 25 MG tablet TAKE 2 TABLETS (50 MG TOTAL) BY MOUTH 3 (THREE) TIMES DAILY.  ? metFORMIN (GLUCOPHAGE) 500 MG tablet TAKE 1 TABLET (500 MG TOTAL) BY MOUTH DAILY WITH BREAKFAST.  ? omeprazole (PRILOSEC) 20 MG capsule Take 1 capsule (20 mg total) by mouth daily. (Patient not taking: Reported on 12/09/2021)  ? Respiratory Therapy Supplies (CARETOUCH CPAP MASK WIPES) MISC For cpap machine  ? sertraline (ZOLOFT) 100 MG tablet Take 1 tablet (100 mg total) by mouth  daily.  ? spironolactone (ALDACTONE) 25 MG tablet TAKE 1 TABLET (25 MG TOTAL) BY MOUTH DAILY.  ? valsartan (DIOVAN) 320 MG tablet Take 1 tablet (320 mg total) by mouth daily.  ? Vitamin D, Ergocalciferol, (DRISDOL) 1.25 MG (50000 UNIT) CAPS capsule Take 1 capsule (50,000 Units total) by mouth every 7 (seven) days.  ? [DISCONTINUED] amLODipine (NORVASC) 10 MG tablet Take 1 tablet by mouth daily.  ? [DISCONTINUED] aspirin EC 81 MG tablet Take 81 mg by mouth daily.  ? [DISCONTINUED] atorvastatin (LIPITOR) 40 MG tablet Take 1 tablet by mouth daily.  ? [DISCONTINUED] fluticasone (FLONASE) 50 MCG/ACT nasal spray Place into the nose.  ? [DISCONTINUED] metFORMIN (GLUCOPHAGE) 500 MG tablet Take by mouth. (Patient not taking: Reported on 12/09/2021)  ? [DISCONTINUED] omeprazole (PRILOSEC) 20 MG capsule Take 1 capsule by mouth daily.  ? [DISCONTINUED] predniSONE (DELTASONE) 20 MG tablet Take 3 tablets (60 mg total) by mouth daily with breakfast.  ? [DISCONTINUED] valsartan (DIOVAN) 160 MG tablet Take 1 tablet (160 mg total) by mouth daily.  ? ?No facility-administered encounter medications on file as of 02/02/2022.  ? ? ?Past Medical History:  ?Diagnosis Date  ? Arthritis   ? Asthma   ? Back pain   ? CHF (congestive heart failure) ([place]) 2008  ? EF 40-50%        ? Fatty liver   ?  Hypertension   ? Leg edema   ? Obesity, morbid ([place])   ? Obstructive sleep apnea   ? Prediabetes   ? ? ?Past Surgical History:  ?Procedure Laterality Date  ? CARDIAC CATHETERIZATION  Oct 2013  ? cardiac cath negative for obstructive disease -- did show end diastolic pressure secondary to systemic hypertension  ? LAPAROSCOPIC GASTRIC SLEEVE RESECTION N/A 01/27/2015  ? Procedure: LAPAROSCOPIC GASTRIC SLEEVE RESECTION WITH UPPER ENDOSCOPY;  Surgeon: Johnathan Hausen, MD;  Location: [facility];  Service: General;  Laterality: N/A;  ? LEFT HEART CATHETERIZATION WITH CORONARY ANGIOGRAM N/A 08/03/2012  ? Procedure: LEFT HEART CATHETERIZATION WITH CORONARY ANGIOGRAM;   Surgeon: Sherren Mocha, MD;  Location: [facility] CATH LAB;  Service: Cardiovascular;  Laterality: N/A;  ? NO PAST SURGERIES    ? ? ?Family History  ?Problem Relation Age of Onset  ? Hypertension Mother   ? Obesity Mother   ? Hypertension Father   ? ? ?Social History  ? ?Socioeconomic History  ? Marital status: Married  ?  Spouse name: Not on file  ? Number of children: Not on file  ? Years of education: Not on file  ? Highest education level: Not on file  ?Occupational History  ? Occupation: Orthoptist  ?Tobacco Use  ? Smoking status: Former  ?  Types: Cigarettes  ?  Quit date: 10/04/2006  ?  Years since quitting: 15.3  ? Smokeless tobacco: Never  ?Substance and Sexual Activity  ? Alcohol use: No  ? Drug use: No  ? Sexual activity: Not on file  ?Other Topics Concern  ? Not on file  ?Social History Narrative  ? 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   ? ?Social Determinants of Health  ? ?Financial Resource Strain: Not on file  ?Food Insecurity: Not on file  ?Transportation Needs: Not on file  ?Physical Activity: Not on file  ?Stress: Not on file  ?Social Connections: Not on file  ?Intimate Partner Violence: Not on file  ? ? ?Review of Systems  ?All other systems reviewed and are negative. ? ?  ? ? ?Objective   ? ?BP (!) 151/100   Pulse 75   Temp 98 ?F (36.7 ?C) (Oral)   Resp 18   Wt (!) 414 lb 3.2 oz (187.9 kg)   SpO2 96%   BMI 57.77 kg/m?  ? ?Physical Exam ?Vitals and nursing note reviewed.  ?Constitutional:   ?   General: He is not in acute distress. ?   Appearance: He is obese.  ?Cardiovascular:  ?   Rate and Rhythm: Normal rate and regular rhythm.  ?Pulmonary:  ?   Effort: Pulmonary effort is normal. No respiratory distress.  ?   Breath sounds: Normal breath sounds. No wheezing.  ?Abdominal:  ?   Palpations: Abdomen is soft.  ?   Tenderness: There is no abdominal tenderness.  ?Musculoskeletal:  ?   Right lower leg: No edema.  ?   Left lower leg: No edema.  ?   Right ankle: Tenderness present.  ?   Left  ankle: Tenderness present.  ?Neurological:  ?   General: No focal deficit present.  ?   Mental Status: He is alert and oriented to person, place, and time.  ?Psychiatric:     ?   Mood and Affect: Mood and affect normal.     ?   Speech: Speech normal.     ?   Behavior: Behavior is slowed. Behavior is cooperative.  ? ? ? ?  ? ?  Assessment & Plan:  ? ?1. Persistent cough for 3 weeks or longer ?Improved/resolved. ? ?2. Uncontrolled hypertension ?Discussed compliance. Will continue and monitor ? ?3. Class 3 severe obesity due to excess calories with serious comorbidity and body mass index (BMI) of 60.0 to 69.9 in adult [facility]) ?Patient weight has increased. Discussed dietary and acitivity options. Goal is 4-6lbs/mo wt LOSS.  ? ?4. Pain in joint involving ankle and foot, unspecified laterality ?Most likely 2/2 above per patient. Will monitor ? ? ? ?No follow-ups on file.  ? ?Becky Sax, MD ? ? ?

## 2022-02-12 ENCOUNTER — Ambulatory Visit (INDEPENDENT_AMBULATORY_CARE_PROVIDER_SITE_OTHER): Payer: BC Managed Care – PPO | Admitting: Family Medicine

## 2022-02-12 ENCOUNTER — Encounter: Payer: Self-pay | Admitting: Family Medicine

## 2022-02-12 VITALS — BP 143/94 | HR 70 | Temp 97.2°F | Resp 18 | Wt >= 6400 oz

## 2022-02-12 DIAGNOSIS — I10 Essential (primary) hypertension: Secondary | ICD-10-CM | POA: Diagnosis not present

## 2022-02-12 DIAGNOSIS — M109 Gout, unspecified: Secondary | ICD-10-CM

## 2022-02-12 DIAGNOSIS — F329 Major depressive disorder, single episode, unspecified: Secondary | ICD-10-CM | POA: Diagnosis not present

## 2022-02-12 DIAGNOSIS — Z6841 Body Mass Index (BMI) 40.0 and over, adult: Secondary | ICD-10-CM

## 2022-02-12 MED ORDER — SERTRALINE HCL 100 MG PO TABS: 100.0000 mg | ORAL_TABLET | Freq: Every day | ORAL | 1 refills | Status: DC

## 2022-02-12 MED ORDER — COLCHICINE 0.6 MG PO TABS: 0.6000 mg | ORAL_TABLET | Freq: Every day | ORAL | 0 refills | Status: DC

## 2022-02-12 NOTE — Progress Notes (Signed)
? ?Established Patient Office Visit ? ?Subjective   ? ?Patient ID: Leonard Lewis, male    DOB: 11-26-1980  Age: 41 y.o. MRN: [id number] ? ?CC:  ?Chief Complaint  ?Patient presents with  ? Follow-up  ?  Chronic issues  ? ? ?HPI ?Leonard Lewis presents for follow up of chronic med issues. Patient also reports that he is having gout sx in his right great toe.  ? ? ?Outpatient Encounter Medications as of 02/12/2022  ?Medication Sig  ? albuterol (VENTOLIN HFA) 108 (90 Base) MCG/ACT inhaler Inhale 2 puffs into the lungs every 6 (six) hours as needed for wheezing or shortness of breath.  ? amLODipine (NORVASC) 10 MG tablet TAKE 1 TABLET (10 MG TOTAL) BY MOUTH DAILY.  ? aspirin 81 MG EC tablet Take by mouth.  ? atorvastatin (LIPITOR) 40 MG tablet Take 1 tablet (40 mg total) by mouth daily.  ? carvedilol (COREG) 25 MG tablet TAKE 1 TABLET (25 MG TOTAL) BY MOUTH 2 (TWO) TIMES DAILY.  ? cetirizine (ZYRTEC) 10 MG tablet Take 1 tablet (10 mg total) by mouth daily.  ? colchicine 0.6 MG tablet Take 1 tablet (0.6 mg total) by mouth daily.  ? cyclobenzaprine (FLEXERIL) 10 MG tablet TAKE 1 TABLET (10 MG TOTAL) BY MOUTH 3 (THREE) TIMES DAILY AS NEEDED FOR MUSCLE SPASMS.  ? Diclofenac Sodium (PENNSAID) 2 % SOLN Place 1 application onto the skin 2 (two) times daily.  ? FARXIGA 10 MG TABS tablet Take 1 tablet (10 mg total) by mouth daily.  ? fluticasone (FLOVENT HFA) 220 MCG/ACT inhaler Inhale 2 puffs into the lungs daily.  ? metFORMIN (GLUCOPHAGE) 500 MG tablet TAKE 1 TABLET (500 MG TOTAL) BY MOUTH DAILY WITH BREAKFAST.  ? Respiratory Therapy Supplies (CARETOUCH CPAP MASK WIPES) MISC For cpap machine  ? spironolactone (ALDACTONE) 25 MG tablet TAKE 1 TABLET (25 MG TOTAL) BY MOUTH DAILY.  ? valsartan (DIOVAN) 320 MG tablet Take 1 tablet (320 mg total) by mouth daily.  ? Vitamin D, Ergocalciferol, (DRISDOL) 1.25 MG (50000 UNIT) CAPS capsule Take 1 capsule (50,000 Units total) by mouth every 7 (seven) days.  ? [DISCONTINUED] sertraline  (ZOLOFT) 100 MG tablet Take 1 tablet (100 mg total) by mouth daily.  ? fluticasone (FLONASE) 50 MCG/ACT nasal spray Place 2 sprays into both nostrils daily. (Patient not taking: Reported on 12/09/2021)  ? hydrALAZINE (APRESOLINE) 25 MG tablet TAKE 2 TABLETS (50 MG TOTAL) BY MOUTH 3 (THREE) TIMES DAILY.  ? omeprazole (PRILOSEC) 20 MG capsule Take 1 capsule (20 mg total) by mouth daily. (Patient not taking: Reported on 12/09/2021)  ? sertraline (ZOLOFT) 100 MG tablet Take 1 tablet (100 mg total) by mouth daily.  ? ?No facility-administered encounter medications on file as of 02/12/2022.  ? ? ?Past Medical History:  ?Diagnosis Date  ? Arthritis   ? Asthma   ? Back pain   ? CHF (congestive heart failure) ([place]) 2008  ? EF 40-50%        ? Fatty liver   ? Hypertension   ? Leg edema   ? Obesity, morbid ([place])   ? Obstructive sleep apnea   ? Prediabetes   ? ? ?Past Surgical History:  ?Procedure Laterality Date  ? CARDIAC CATHETERIZATION  Oct 2013  ? cardiac cath negative for obstructive disease -- did show end diastolic pressure secondary to systemic hypertension  ? LAPAROSCOPIC GASTRIC SLEEVE RESECTION N/A 01/27/2015  ? Procedure: LAPAROSCOPIC GASTRIC SLEEVE RESECTION WITH UPPER ENDOSCOPY;  Surgeon: Johnathan Hausen, MD;  Location: [facility];  Service: General;  Laterality: N/A;  ? LEFT HEART CATHETERIZATION WITH CORONARY ANGIOGRAM N/A 08/03/2012  ? Procedure: LEFT HEART CATHETERIZATION WITH CORONARY ANGIOGRAM;  Surgeon: Michael Cooper, MD;  Location: [facility] CATH LAB;  Service: Cardiovascular;  Laterality: N/A;  ? NO PAST SURGERIES    ? ? ?Family History  ?Problem Relation Age of Onset  ? Hypertension Mother   ? Obesity Mother   ? Hypertension Father   ? ? ?Social History  ? ?Socioeconomic History  ? Marital status: Married  ?  Spouse name: Not on file  ? Number of children: Not on file  ? Years of education: Not on file  ? Highest education level: Not on file  ?Occupational History  ? Occupation: Field Service Technician  ?Tobacco Use  ?  Smoking status: Former  ?  Types: Cigarettes  ?  Quit date: 10/04/2006  ?  Years since quitting: 15.3  ? Smokeless tobacco: Never  ?Substance and Sexual Activity  ? Alcohol use: No  ? Drug use: No  ? Sexual activity: Not on file  ?Other Topics Concern  ? Not on file  ?Social History Narrative  ? 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   ? ?Social Determinants of Health  ? ?Financial Resource Strain: Not on file  ?Food Insecurity: Not on file  ?Transportation Needs: Not on file  ?Physical Activity: Not on file  ?Stress: Not on file  ?Social Connections: Not on file  ?Intimate Partner Violence: Not on file  ? ? ?Review of Systems  ?All other systems reviewed and are negative. ? ?  ? ? ?Objective   ? ?BP (!) 143/94   Pulse 70   Temp (!) 97.2 ?F (36.2 ?C) (Oral)   Resp 18   Wt (!) 409 lb 12.8 oz (185.9 kg)   SpO2 95%   BMI 57.16 kg/m?  ? ?Physical Exam ?Vitals and nursing note reviewed.  ?Constitutional:   ?   General: He is not in acute distress. ?   Appearance: He is obese.  ?Cardiovascular:  ?   Rate and Rhythm: Normal rate and regular rhythm.  ?Pulmonary:  ?   Effort: Pulmonary effort is normal. No respiratory distress.  ?   Breath sounds: Normal breath sounds. No wheezing.  ?Abdominal:  ?   Palpations: Abdomen is soft.  ?   Tenderness: There is no abdominal tenderness.  ?Musculoskeletal:  ?   Right foot: Tenderness (right great toe) present.  ?Neurological:  ?   General: No focal deficit present.  ?   Mental Status: He is alert and oriented to person, place, and time.  ?Psychiatric:     ?   Mood and Affect: Mood and affect normal.     ?   Speech: Speech normal.     ?   Behavior: Behavior is cooperative.  ? ? ? ?  ? ?Assessment & Plan:  ? ?1. Uncontrolled hypertension ?Referral to cardiology for further eval/mgt ?- Ambulatory referral to Cardiology ? ?2. Class 3 severe obesity due to excess calories with serious comorbidity and body mass index (BMI) of 60.0 to 69.9 in adult (HCC) ?Discussed dietary and activity  options.  ? ?3. Acute gout involving toe of right foot, unspecified cause ?Colchicine prescribed. Discussed dietary options.  ? ?4. Reactive depression ?Zoloft refilled. Appears stable with present management.  ? ? ? ?No follow-ups on file.  ? ?Wilson, Amelia P, MD ? ? ?

## 2022-02-18 NOTE — Progress Notes (Deleted)
Cardiology Office Note:    Date:  02/18/2022   ID:  Leonard Lewis, DOB [birth year], MRN [id number]  PCP:  Dorna Mai, MD   [facility] Providers Cardiologist:  None { Click to update primary MD,subspecialty MD or APP then REFRESH:1}    Referring MD: Dorna Mai, MD   No chief complaint on file. Cardiomyopathy  History of Present Illness:    Leonard Lewis is a 41 y.o. male with a hx of OSA, EF 25-30 in 2015 LVIDD 58 mm,  LV dysfunction EF 40-45,  akinesis of the basal inferior wall, in 2016, no prior ischemic evaluation prior patient of Dr. Johnsie Cancel  Past Medical History:  Diagnosis Date   Arthritis    Asthma    Back pain    CHF (congestive heart failure) ([place]) 2008   EF 40-50%         Fatty liver    Hypertension    Leg edema    Obesity, morbid ([place])    Obstructive sleep apnea    Prediabetes     Past Surgical History:  Procedure Laterality Date   CARDIAC CATHETERIZATION  Oct 2013   cardiac cath negative for obstructive disease -- did show end diastolic pressure secondary to systemic hypertension   LAPAROSCOPIC GASTRIC SLEEVE RESECTION N/A 01/27/2015   Procedure: LAPAROSCOPIC GASTRIC SLEEVE RESECTION WITH UPPER ENDOSCOPY;  Surgeon: Johnathan Hausen, MD;  Location: [facility];  Service: General;  Laterality: N/A;   LEFT HEART CATHETERIZATION WITH CORONARY ANGIOGRAM N/A 08/03/2012   Procedure: LEFT HEART CATHETERIZATION WITH CORONARY ANGIOGRAM;  Surgeon: Sherren Mocha, MD;  Location: [facility] CATH LAB;  Service: Cardiovascular;  Laterality: N/A;   NO PAST SURGERIES      Current Medications: No outpatient medications have been marked as taking for the 02/19/22 encounter (Appointment) with Janina Mayo, MD.     Allergies:   Patient has no known allergies.   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    Social Determinants of Health   Financial Resource Strain: Not on file  Food Insecurity: Not on file  Transportation Needs: Not on file  Physical Activity: Not on file  Stress: Not on file  Social Connections: Not on file     Family History: The patient's ***family history includes Hypertension in his father and mother; Obesity in his mother.  ROS:   Please see the history of present illness.    *** All other systems reviewed and are negative.  EKGs/Labs/Other Studies Reviewed:    The following studies were reviewed today: ***  EKG:  EKG is *** ordered today.  The ekg ordered today demonstrates ***  Recent Labs: 11/09/2021: ALT 15 01/13/2022: BUN 22; Creatinine, Ser 1.76; Hemoglobin 12.2; Platelets 305; Potassium 4.1; Sodium 139  Recent Lipid Panel    Component Value Date/Time  CHOL 107 11/09/2021 1444   TRIG 127 11/09/2021 1444   HDL 26 (L) 11/09/2021 1444   CHOLHDL 4.1 11/09/2021 1444   CHOLHDL 5.5 Ratio 11/25/2008 2123   VLDL 33 11/25/2008 2123   LDLCALC 58 11/09/2021 1444   LDLDIRECT 119 (H) 12/26/2019 1505   LDLDIRECT 126 (H) 02/05/2013 1103     Risk Assessment/Calculations:   {Does this patient have ATRIAL FIBRILLATION?[phone]}       Physical Exam:    VS:  There were no vitals taken for this visit.    Wt Readings from Last 3 Encounters:  02/12/22 (!) 409 lb 12.8 oz (185.9 kg)  02/02/22 (!) 414 lb 3.2 oz (187.9 kg)  01/21/22 (!) 410 lb 12.8 oz (186.3 kg)     GEN: *** Well nourished, well developed in no acute distress HEENT: Normal NECK: No JVD;  No carotid bruits LYMPHATICS: No lymphadenopathy CARDIAC: ***RRR, no murmurs, rubs, gallops RESPIRATORY:  Clear to auscultation without rales, wheezing or rhonchi  ABDOMEN: Soft, non-tender, non-distended MUSCULOSKELETAL:  No edema; No deformity  SKIN: Warm and dry NEUROLOGIC:  Alert and oriented x 3 PSYCHIATRIC:  Normal affect   ASSESSMENT:    No diagnosis found. PLAN:    In order of problems listed above:  ***      {Are you ordering a CV Procedure (e.g. stress test, cath, DCCV, TEE, etc)?   Press F2        :[id number]}    Medication Adjustments/Labs and Tests Ordered: Current medicines are reviewed at length with the patient today.  Concerns regarding medicines are outlined above.  No orders of the defined types were placed in this encounter.  No orders of the defined types were placed in this encounter.   There are no Patient Instructions on file for this visit.   Signed, Janina Mayo, MD  02/18/2022 3:56 PM    [place]

## 2022-02-19 ENCOUNTER — Ambulatory Visit: Payer: BC Managed Care – PPO | Admitting: Internal Medicine

## 2022-03-02 ENCOUNTER — Telehealth (INDEPENDENT_AMBULATORY_CARE_PROVIDER_SITE_OTHER): Payer: Self-pay | Admitting: Family Medicine

## 2022-03-02 NOTE — Telephone Encounter (Signed)
LMOM for pt to return call to sch NP appt

## 2022-03-05 ENCOUNTER — Encounter: Payer: Self-pay | Admitting: *Deleted

## 2022-03-19 ENCOUNTER — Ambulatory Visit: Payer: BC Managed Care – PPO | Admitting: Family Medicine

## 2022-04-24 ENCOUNTER — Other Ambulatory Visit: Payer: Self-pay | Admitting: Family Medicine

## 2022-04-24 DIAGNOSIS — R7303 Prediabetes: Secondary | ICD-10-CM

## 2022-04-24 DIAGNOSIS — I5022 Chronic systolic (congestive) heart failure: Secondary | ICD-10-CM

## 2022-04-30 MED ORDER — METFORMIN HCL 500 MG PO TABS
ORAL_TABLET | Freq: Every day | ORAL | 0 refills | Status: DC
Start: 2022-04-30 — End: 2023-02-10

## 2022-04-30 MED ORDER — FLUTICASONE PROPIONATE HFA 220 MCG/ACT IN AERO: 2.0000 | INHALATION_SPRAY | Freq: Every day | RESPIRATORY_TRACT | 0 refills | Status: DC

## 2022-04-30 MED ORDER — SPIRONOLACTONE 25 MG PO TABS: ORAL_TABLET | Freq: Every day | ORAL | 0 refills | Status: DC

## 2022-05-03 ENCOUNTER — Other Ambulatory Visit: Payer: Self-pay | Admitting: Family Medicine

## 2022-05-04 MED ORDER — VALSARTAN 320 MG PO TABS: 320.0000 mg | ORAL_TABLET | Freq: Every day | ORAL | 0 refills | Status: DC

## 2022-05-12 ENCOUNTER — Encounter (INDEPENDENT_AMBULATORY_CARE_PROVIDER_SITE_OTHER): Payer: Self-pay

## 2022-05-14 ENCOUNTER — Other Ambulatory Visit: Payer: Self-pay

## 2022-05-14 ENCOUNTER — Inpatient Hospital Stay (HOSPITAL_BASED_OUTPATIENT_CLINIC_OR_DEPARTMENT_OTHER)
Admission: EM | Admit: 2022-05-14 | Discharge: 2022-05-16 | DRG: 871 | Disposition: A | Discharge status: Home / Self Care | Payer: BC Managed Care – PPO | Attending: Internal Medicine | Admitting: Internal Medicine

## 2022-05-14 ENCOUNTER — Emergency Department (HOSPITAL_BASED_OUTPATIENT_CLINIC_OR_DEPARTMENT_OTHER): Payer: BC Managed Care – PPO

## 2022-05-14 ENCOUNTER — Encounter (HOSPITAL_COMMUNITY): Payer: Self-pay

## 2022-05-14 ENCOUNTER — Encounter (HOSPITAL_BASED_OUTPATIENT_CLINIC_OR_DEPARTMENT_OTHER): Payer: Self-pay | Admitting: Emergency Medicine

## 2022-05-14 DIAGNOSIS — Z87891 Personal history of nicotine dependence: Secondary | ICD-10-CM

## 2022-05-14 DIAGNOSIS — I248 Other forms of acute ischemic heart disease: Secondary | ICD-10-CM | POA: Diagnosis present

## 2022-05-14 DIAGNOSIS — Z7984 Long term (current) use of oral hypoglycemic drugs: Secondary | ICD-10-CM | POA: Diagnosis not present

## 2022-05-14 DIAGNOSIS — R0602 Shortness of breath: Secondary | ICD-10-CM | POA: Diagnosis not present

## 2022-05-14 DIAGNOSIS — M1990 Unspecified osteoarthritis, unspecified site: Secondary | ICD-10-CM | POA: Diagnosis not present

## 2022-05-14 DIAGNOSIS — G4733 Obstructive sleep apnea (adult) (pediatric): Secondary | ICD-10-CM | POA: Diagnosis not present

## 2022-05-14 DIAGNOSIS — E785 Hyperlipidemia, unspecified: Secondary | ICD-10-CM | POA: Diagnosis present

## 2022-05-14 DIAGNOSIS — Z23 Encounter for immunization: Secondary | ICD-10-CM

## 2022-05-14 DIAGNOSIS — Z6841 Body Mass Index (BMI) 40.0 and over, adult: Secondary | ICD-10-CM

## 2022-05-14 DIAGNOSIS — I10 Essential (primary) hypertension: Secondary | ICD-10-CM | POA: Diagnosis present

## 2022-05-14 DIAGNOSIS — Z7982 Long term (current) use of aspirin: Secondary | ICD-10-CM

## 2022-05-14 DIAGNOSIS — I130 Hypertensive heart and chronic kidney disease with heart failure and stage 1 through stage 4 chronic kidney disease, or unspecified chronic kidney disease: Secondary | ICD-10-CM | POA: Diagnosis not present

## 2022-05-14 DIAGNOSIS — K219 Gastro-esophageal reflux disease without esophagitis: Secondary | ICD-10-CM | POA: Diagnosis present

## 2022-05-14 DIAGNOSIS — A419 Sepsis, unspecified organism: Principal | ICD-10-CM | POA: Diagnosis present

## 2022-05-14 DIAGNOSIS — R7989 Other specified abnormal findings of blood chemistry: Secondary | ICD-10-CM | POA: Diagnosis not present

## 2022-05-14 DIAGNOSIS — Z20822 Contact with and (suspected) exposure to covid-19: Secondary | ICD-10-CM | POA: Diagnosis present

## 2022-05-14 DIAGNOSIS — Z8249 Family history of ischemic heart disease and other diseases of the circulatory system: Secondary | ICD-10-CM | POA: Diagnosis not present

## 2022-05-14 DIAGNOSIS — R918 Other nonspecific abnormal finding of lung field: Secondary | ICD-10-CM | POA: Diagnosis not present

## 2022-05-14 DIAGNOSIS — K760 Fatty (change of) liver, not elsewhere classified: Secondary | ICD-10-CM | POA: Diagnosis not present

## 2022-05-14 DIAGNOSIS — I5042 Chronic combined systolic (congestive) and diastolic (congestive) heart failure: Secondary | ICD-10-CM | POA: Diagnosis not present

## 2022-05-14 DIAGNOSIS — J45909 Unspecified asthma, uncomplicated: Secondary | ICD-10-CM | POA: Diagnosis present

## 2022-05-14 DIAGNOSIS — Z79899 Other long term (current) drug therapy: Secondary | ICD-10-CM | POA: Diagnosis not present

## 2022-05-14 DIAGNOSIS — N1832 Chronic kidney disease, stage 3b: Secondary | ICD-10-CM | POA: Diagnosis present

## 2022-05-14 DIAGNOSIS — J189 Pneumonia, unspecified organism: Secondary | ICD-10-CM | POA: Diagnosis present

## 2022-05-14 DIAGNOSIS — R7303 Prediabetes: Secondary | ICD-10-CM | POA: Diagnosis present

## 2022-05-14 DIAGNOSIS — R079 Chest pain, unspecified: Secondary | ICD-10-CM | POA: Diagnosis not present

## 2022-05-14 DIAGNOSIS — R0789 Other chest pain: Secondary | ICD-10-CM | POA: Diagnosis not present

## 2022-05-14 LAB — BASIC METABOLIC PANEL
Anion gap: 8 (ref 5–15)
BUN: 22 mg/dL — ABNORMAL HIGH (ref 6–20)
CO2: 24 mmol/L (ref 22–32)
Calcium: 9.2 mg/dL (ref 8.9–10.3)
Chloride: 104 mmol/L (ref 98–111)
Creatinine, Ser: 1.82 mg/dL — ABNORMAL HIGH (ref 0.61–1.24)
GFR, Estimated: 47 mL/min — ABNORMAL LOW (ref >60)
Glucose, Bld: 119 mg/dL — ABNORMAL HIGH (ref 70–99)
Potassium: 3.9 mmol/L (ref 3.5–5.1)
Sodium: 136 mmol/L (ref 135–145)

## 2022-05-14 LAB — CBC WITH DIFFERENTIAL/PLATELET
Abs Immature Granulocytes: 0.11 10*3/uL — ABNORMAL HIGH (ref 0.00–0.07)
Basophils Absolute: 0.1 10*3/uL (ref 0.0–0.1)
Basophils Relative: 0 %
Eosinophils Absolute: 0 10*3/uL (ref 0.0–0.5)
Eosinophils Relative: 0 %
HCT: 40.6 % (ref 39.0–52.0)
Hemoglobin: 12.8 g/dL — ABNORMAL LOW (ref 13.0–17.0)
Immature Granulocytes: 1 %
Lymphocytes Relative: 8 %
Lymphs Abs: 1.6 10*3/uL (ref 0.7–4.0)
MCH: 22 pg — ABNORMAL LOW (ref 26.0–34.0)
MCHC: 31.5 g/dL (ref 30.0–36.0)
MCV: 69.6 fL — ABNORMAL LOW (ref 80.0–100.0)
Monocytes Absolute: 1.4 10*3/uL — ABNORMAL HIGH (ref 0.1–1.0)
Monocytes Relative: 7 %
Neutro Abs: 17.1 10*3/uL — ABNORMAL HIGH (ref 1.7–7.7)
Neutrophils Relative %: 84 %
Platelets: 260 10*3/uL (ref 150–400)
RBC: 5.83 MIL/uL — ABNORMAL HIGH (ref 4.22–5.81)
RDW: 16.1 % — ABNORMAL HIGH (ref 11.5–15.5)
WBC: 20.2 10*3/uL — ABNORMAL HIGH (ref 4.0–10.5)
nRBC: 0 % (ref 0.0–0.2)

## 2022-05-14 LAB — SARS CORONAVIRUS 2 BY RT PCR: SARS Coronavirus 2 by RT PCR: NEGATIVE

## 2022-05-14 LAB — BRAIN NATRIURETIC PEPTIDE: B Natriuretic Peptide: 108 pg/mL — ABNORMAL HIGH (ref 0.0–100.0)

## 2022-05-14 LAB — TROPONIN I (HIGH SENSITIVITY)
Troponin I (High Sensitivity): 31 ng/L — ABNORMAL HIGH (ref <18)
Troponin I (High Sensitivity): 32 ng/L — ABNORMAL HIGH (ref <18)

## 2022-05-14 LAB — D-DIMER, QUANTITATIVE: D-Dimer, Quant: 0.98 ug/mL-FEU — ABNORMAL HIGH (ref 0.00–0.50)

## 2022-05-14 MED ORDER — SODIUM CHLORIDE 0.9 % IV SOLN
500.0000 mg | Freq: Once | INTRAVENOUS | Status: AC
  Administered 2022-05-14: 500 mg via INTRAVENOUS
  Filled 2022-05-14: qty 5

## 2022-05-14 MED ORDER — PNEUMOCOCCAL VAC POLYVALENT 25 MCG/0.5ML IJ INJ
0.5000 mL | INJECTION | INTRAMUSCULAR | Status: AC
  Administered 2022-05-16: 0.5 mL via INTRAMUSCULAR
  Filled 2022-05-14: qty 0.5

## 2022-05-14 MED ORDER — BUDESONIDE 0.5 MG/2ML IN SUSP
0.5000 mg | Freq: Two times a day (BID) | RESPIRATORY_TRACT | Status: DC
  Administered 2022-05-14 – 2022-05-16 (×4): 0.5 mg via RESPIRATORY_TRACT
  Filled 2022-05-14 (×4): qty 2

## 2022-05-14 MED ORDER — ONDANSETRON HCL 4 MG/2ML IJ SOLN: 4.0000 mg | Freq: Four times a day (QID) | INTRAMUSCULAR | Status: DC | PRN

## 2022-05-14 MED ORDER — ACETAMINOPHEN 325 MG PO TABS
650.0000 mg | ORAL_TABLET | Freq: Once | ORAL | Status: AC
Start: 2022-05-14 — End: 2022-05-14
  Administered 2022-05-14: 650 mg via ORAL
  Filled 2022-05-14: qty 2

## 2022-05-14 MED ORDER — ACETAMINOPHEN 650 MG RE SUPP: 650.0000 mg | Freq: Four times a day (QID) | RECTAL | Status: DC | PRN

## 2022-05-14 MED ORDER — SODIUM CHLORIDE 0.9 % IV SOLN
1.0000 g | Freq: Every day | INTRAVENOUS | Status: DC
  Administered 2022-05-15 – 2022-05-16 (×2): 1 g via INTRAVENOUS
  Filled 2022-05-14 (×2): qty 10

## 2022-05-14 MED ORDER — SODIUM CHLORIDE 0.9 % IV BOLUS
500.0000 mL | Freq: Once | INTRAVENOUS | Status: AC
  Administered 2022-05-14: 500 mL via INTRAVENOUS

## 2022-05-14 MED ORDER — SERTRALINE HCL 100 MG PO TABS
100.0000 mg | ORAL_TABLET | Freq: Every day | ORAL | Status: DC
  Administered 2022-05-14 – 2022-05-16 (×3): 100 mg via ORAL
  Filled 2022-05-14 (×3): qty 1

## 2022-05-14 MED ORDER — ALBUTEROL SULFATE (2.5 MG/3ML) 0.083% IN NEBU
2.5000 mg | INHALATION_SOLUTION | Freq: Four times a day (QID) | RESPIRATORY_TRACT | Status: DC
  Administered 2022-05-14: 2.5 mg via RESPIRATORY_TRACT
  Filled 2022-05-14: qty 3

## 2022-05-14 MED ORDER — AMLODIPINE BESYLATE 10 MG PO TABS
10.0000 mg | ORAL_TABLET | Freq: Every day | ORAL | Status: DC
  Administered 2022-05-14 – 2022-05-16 (×3): 10 mg via ORAL
  Filled 2022-05-14 (×3): qty 1

## 2022-05-14 MED ORDER — ALBUTEROL SULFATE (2.5 MG/3ML) 0.083% IN NEBU
2.5000 mg | INHALATION_SOLUTION | Freq: Two times a day (BID) | RESPIRATORY_TRACT | Status: DC
  Administered 2022-05-15 – 2022-05-16 (×3): 2.5 mg via RESPIRATORY_TRACT
  Filled 2022-05-14 (×3): qty 3

## 2022-05-14 MED ORDER — ONDANSETRON HCL 4 MG PO TABS
4.0000 mg | ORAL_TABLET | Freq: Four times a day (QID) | ORAL | Status: DC | PRN
  Administered 2022-05-15: 4 mg via ORAL
  Filled 2022-05-14: qty 1

## 2022-05-14 MED ORDER — HEPARIN SODIUM (PORCINE) 5000 UNIT/ML IJ SOLN
5000.0000 [IU] | Freq: Three times a day (TID) | INTRAMUSCULAR | Status: DC
  Administered 2022-05-14 – 2022-05-16 (×5): 5000 [IU] via SUBCUTANEOUS
  Filled 2022-05-14 (×5): qty 1

## 2022-05-14 MED ORDER — ACETAMINOPHEN 325 MG PO TABS
650.0000 mg | ORAL_TABLET | Freq: Four times a day (QID) | ORAL | Status: DC | PRN
  Filled 2022-05-14: qty 2

## 2022-05-14 MED ORDER — ALBUTEROL SULFATE (2.5 MG/3ML) 0.083% IN NEBU: 3.0000 mL | INHALATION_SOLUTION | Freq: Four times a day (QID) | RESPIRATORY_TRACT | Status: DC | PRN

## 2022-05-14 MED ORDER — ASPIRIN 81 MG PO TBEC
81.0000 mg | DELAYED_RELEASE_TABLET | Freq: Every day | ORAL | Status: DC
Start: 2022-05-14 — End: 2022-05-14

## 2022-05-14 MED ORDER — DOCUSATE SODIUM 100 MG PO CAPS
100.0000 mg | ORAL_CAPSULE | Freq: Two times a day (BID) | ORAL | Status: DC
  Administered 2022-05-15: 100 mg via ORAL
  Filled 2022-05-14 (×3): qty 1

## 2022-05-14 MED ORDER — CYCLOBENZAPRINE HCL 10 MG PO TABS
10.0000 mg | ORAL_TABLET | Freq: Three times a day (TID) | ORAL | Status: DC | PRN
Start: 2022-05-14 — End: 2022-05-14

## 2022-05-14 MED ORDER — ATORVASTATIN CALCIUM 40 MG PO TABS
40.0000 mg | ORAL_TABLET | Freq: Every day | ORAL | Status: DC
  Administered 2022-05-14 – 2022-05-16 (×3): 40 mg via ORAL
  Filled 2022-05-14 (×3): qty 1

## 2022-05-14 MED ORDER — IOHEXOL 350 MG/ML SOLN
100.0000 mL | Freq: Once | INTRAVENOUS | Status: AC | PRN
  Administered 2022-05-14: 100 mL via INTRAVENOUS

## 2022-05-14 MED ORDER — HYDRALAZINE HCL 20 MG/ML IJ SOLN
10.0000 mg | Freq: Three times a day (TID) | INTRAMUSCULAR | Status: DC | PRN
  Administered 2022-05-15: 10 mg via INTRAVENOUS
  Filled 2022-05-14: qty 1

## 2022-05-14 MED ORDER — SODIUM CHLORIDE 0.9 % IV SOLN
1.0000 g | INTRAVENOUS | Status: DC
Start: 2022-05-14 — End: 2022-05-14
  Administered 2022-05-14: 1 g via INTRAVENOUS
  Filled 2022-05-14: qty 10

## 2022-05-14 MED ORDER — SODIUM CHLORIDE 0.9 % IV SOLN
500.0000 mg | INTRAVENOUS | Status: DC
  Administered 2022-05-15 – 2022-05-16 (×2): 500 mg via INTRAVENOUS
  Filled 2022-05-14 (×2): qty 5

## 2022-05-14 MED ORDER — GUAIFENESIN ER 600 MG PO TB12
600.0000 mg | ORAL_TABLET | Freq: Two times a day (BID) | ORAL | Status: DC
  Administered 2022-05-14 – 2022-05-16 (×4): 600 mg via ORAL
  Filled 2022-05-14 (×4): qty 1

## 2022-05-14 NOTE — ED Notes (Signed)
Pt given cup of ice per request  

## 2022-05-14 NOTE — ED Notes (Signed)
Pt aware that he is to be admitted , wife into visit

## 2022-05-14 NOTE — H&P (Signed)
History and Physical    Leonard Lewis [id number] DOB: June 25, 1981 DOA: 05/14/2022  PCP: Dorna Mai, MD   Patient coming from: Home  I have personally briefly reviewed patient's old medical records in [facility] ([facility]).  Chief Complaint: Left-sided chest pain, cough and shortness of breath.  HPI: Leonard Lewis is a 41 y.o. male with PMH significant for chronic systolic CHF, morbid obesity, hypertension, GERD, obstructive sleep apnea, fatty liver, prediabetes presented in the ED with complaints of left-sided chest pain associated with cough and shortness of breath.  Patient reports he was in his usual state of health and has developed chest pain below left axillary area, denies any trauma, injury or  pulled muscle,  describes pain as 10 /10 on pain intensity and then he developed shortness of breath.  He reports having intermittent cough for few days.  He thought he might have GERD and took omeprazole without any relief.  He reports similar episode in the past and it got resolved with the omeprazole.  He went to [facility] where he was found to have fever 102.2, He was given some pain medications which resolved his pain.  Patient reports he is stressed because he does 2 jobs because of financial liabilities.  ED Course: He was febrile, tachycardic, tachypneic, hypertensive with normal SPO2 on room air. Temp 102.7, HR 113, RR 19, BP 156/87, SPO2 100% on room air, Labs include sodium 136, potassium 3.9, chloride 104, bicarb 24, glucose 119, BUN 22, creatinine 1.82, calcium 9.2, anion gap 8, BNP 108, troponin 31> 32, WBC 20.2, hemoglobin 12.8, hematocrit 40.6, platelet 260, COVID, negative, D-dimer 0.98. Chest x-ray shows left lower lobe opacity /mass. CTA chest : No evidence of PE, rounded LEFT lower lobe retrocardiac consolidation consistent with masslike lesion , thyroid nodule  Review of Systems:  Review of Systems  Constitutional:  Positive for chills, fever and  malaise/fatigue.  HENT: Negative.    Eyes: Negative.   Respiratory:  Positive for cough, shortness of breath and wheezing.   Cardiovascular:  Positive for chest pain and leg swelling.  Gastrointestinal: Negative.   Genitourinary: Negative.   Musculoskeletal: Negative.   Skin: Negative.   Neurological: Negative.   Endo/Heme/Allergies: Negative.   Psychiatric/Behavioral: Negative.      Past Medical History:  Diagnosis Date   Arthritis    Asthma    Back pain    CHF (congestive heart failure) ([place]) 2008   EF 40-50%         Fatty liver    Hypertension    Leg edema    Obesity, morbid ([place])    Obstructive sleep apnea    Prediabetes     Past Surgical History:  Procedure Laterality Date   CARDIAC CATHETERIZATION  Oct 2013   cardiac cath negative for obstructive disease -- did show end diastolic pressure secondary to systemic hypertension   LAPAROSCOPIC GASTRIC SLEEVE RESECTION N/A 01/27/2015   Procedure: LAPAROSCOPIC GASTRIC SLEEVE RESECTION WITH UPPER ENDOSCOPY;  Surgeon: Johnathan Hausen, MD;  Location: [facility];  Service: General;  Laterality: N/A;   LEFT HEART CATHETERIZATION WITH CORONARY ANGIOGRAM N/A 08/03/2012   Procedure: LEFT HEART CATHETERIZATION WITH CORONARY ANGIOGRAM;  Surgeon: Sherren Mocha, MD;  Location: [facility] CATH LAB;  Service: Cardiovascular;  Laterality: N/A;   NO PAST SURGERIES       reports that he quit smoking about 15 years ago. His smoking use included cigarettes. He has never used smokeless tobacco. He reports that he does not drink alcohol  and does not use drugs.  No Known Allergies  Family History  Problem Relation Age of Onset   Hypertension Mother    Obesity Mother    Hypertension Father    Family history reviewed and not pertinent .  Prior to Admission medications   Medication Sig Start Date End Date Taking? Authorizing Provider  amLODipine (NORVASC) 10 MG tablet TAKE 1 TABLET (10 MG TOTAL) BY MOUTH DAILY. 06/30/21 06/30/22 Yes Georganna Skeans, MD   atorvastatin (LIPITOR) 40 MG tablet Take 1 tablet (40 mg total) by mouth daily. 12/07/21  Yes Georganna Skeans, MD  carvedilol (COREG) 25 MG tablet TAKE 1 TABLET (25 MG TOTAL) BY MOUTH 2 (TWO) TIMES DAILY. 06/30/21 06/30/22 Yes Georganna Skeans, MD  Diclofenac Sodium (PENNSAID) 2 % SOLN Place 1 application onto the skin 2 (two) times daily. Patient taking differently: Place 1 application  onto the skin 2 (two) times daily as needed (pain). 06/20/20  Yes Myra Rude, MD  fluticasone (FLOVENT HFA) 220 MCG/ACT inhaler Inhale 2 puffs into the lungs daily. Patient taking differently: Inhale 2 puffs into the lungs daily as needed (sob). 04/30/22  Yes Georganna Skeans, MD  hydrALAZINE (APRESOLINE) 25 MG tablet Take 50 mg by mouth daily.   Yes [provider]  metFORMIN (GLUCOPHAGE) 500 MG tablet TAKE 1 TABLET (500 MG TOTAL) BY MOUTH DAILY WITH BREAKFAST. 04/30/22 04/30/23 Yes Georganna Skeans, MD  sertraline (ZOLOFT) 100 MG tablet Take 1 tablet (100 mg total) by mouth daily. 02/12/22  Yes Georganna Skeans, MD  spironolactone (ALDACTONE) 25 MG tablet TAKE 1 TABLET (25 MG TOTAL) BY MOUTH DAILY. 04/30/22 04/30/23 Yes Georganna Skeans, MD  valsartan (DIOVAN) 320 MG tablet Take 1 tablet (320 mg total) by mouth daily. 05/04/22  Yes Georganna Skeans, MD  albuterol (VENTOLIN HFA) 108 (90 Base) MCG/ACT inhaler Inhale 2 puffs into the lungs every 6 (six) hours as needed for wheezing or shortness of breath. 06/30/21   Georganna Skeans, MD  aspirin 81 MG EC tablet Take by mouth.    [provider]  cetirizine (ZYRTEC) 10 MG tablet Take 1 tablet (10 mg total) by mouth daily. Patient not taking: Reported on 05/14/2022 12/07/21   Georganna Skeans, MD  colchicine 0.6 MG tablet Take 1 tablet (0.6 mg total) by mouth daily. 02/12/22   Georganna Skeans, MD  cyclobenzaprine (FLEXERIL) 10 MG tablet TAKE 1 TABLET (10 MG TOTAL) BY MOUTH 3 (THREE) TIMES DAILY AS NEEDED FOR MUSCLE SPASMS. 06/30/21 06/30/22  Georganna Skeans, MD  FARXIGA 10 MG TABS  tablet Take 1 tablet (10 mg total) by mouth daily. 04/29/20   Storm Frisk, MD  fluticasone (FLONASE) 50 MCG/ACT nasal spray Place 2 sprays into both nostrils daily. Patient not taking: Reported on 12/09/2021 12/26/19   Arvilla Market, MD  hydrALAZINE (APRESOLINE) 25 MG tablet TAKE 2 TABLETS (50 MG TOTAL) BY MOUTH 3 (THREE) TIMES DAILY. 11/04/20 11/04/21  Mayers, Cari S, PA-C  omeprazole (PRILOSEC) 20 MG capsule Take 1 capsule (20 mg total) by mouth daily. Patient not taking: Reported on 12/09/2021 09/21/21   Georganna Skeans, MD  Respiratory Therapy Supplies [facility] CPAP MASK WIPES) MISC For cpap machine 07/29/21   Georganna Skeans, MD  Vitamin D, Ergocalciferol, (DRISDOL) 1.25 MG (50000 UNIT) CAPS capsule Take 1 capsule (50,000 Units total) by mouth every 7 (seven) days. Patient not taking: Reported on 05/14/2022 06/30/21   Georganna Skeans, MD    Physical Exam: Vitals:   05/14/22 1445 05/14/22 1500 05/14/22 1507 05/14/22 1558  BP: Marland Kitchen)  154/91 (!) 139/104  (!) 156/87  Pulse: (!) 110 (!) 101  (!) 113  Resp: (!) 28 (!) 25  18  Temp:   (!) 102.7 F (39.3 C) 98.7 F (37.1 C)  TempSrc:   Oral Oral  SpO2: 95% 97%  100%  Weight:      Height:        Constitutional: Appears comfortable, not in any acute distress.  Morbidly obese. Vitals:   05/14/22 1445 05/14/22 1500 05/14/22 1507 05/14/22 1558  BP: (!) 154/91 (!) 139/104  (!) 156/87  Pulse: (!) 110 (!) 101  (!) 113  Resp: (!) 28 (!) 25  18  Temp:   (!) 102.7 F (39.3 C) 98.7 F (37.1 C)  TempSrc:   Oral Oral  SpO2: 95% 97%  100%  Weight:      Height:       Eyes: PERRL, lids and conjunctivae normal ENMT: Mucous membranes are moist.  Posterior pharynx without exudate. Neck: normal, supple, no masses, no thyromegaly Respiratory: CTA bilaterally, mild wheezing noted on back side, no crackles.  Normal respiratory effort Cardiovascular: S1-S2 heard, regular rate and rhythm, no murmur.   Abdomen: Soft, non tender, non distended, BS  + Musculoskeletal: no clubbing / cyanosis. Good ROM, no contractures. Normal muscle tone.  Skin: no rashes, lesions, ulcers. No induration Neurologic: CN 2-12 grossly intact. Sensation intact, DTR normal. Strength 5/5 in all 4.  Psychiatric: Normal judgment and insight. Alert and oriented x 3. Normal mood.     Labs on Admission: I have personally reviewed following labs and imaging studies  CBC: Recent Labs  Lab 05/14/22 0707  WBC 20.2*  NEUTROABS 17.1*  HGB 12.8*  HCT 40.6  MCV 69.6*  PLT 123456   Basic Metabolic Panel: Recent Labs  Lab 05/14/22 0707  NA 136  K 3.9  CL 104  CO2 24  GLUCOSE 119*  BUN 22*  CREATININE 1.82*  CALCIUM 9.2   GFR: Estimated Creatinine Clearance: 94.1 mL/min (A) (by C-G formula based on SCr of 1.82 mg/dL (H)). Liver Function Tests: No results for input(s): "AST", "ALT", "ALKPHOS", "BILITOT", "PROT", "ALBUMIN" in the last 168 hours. No results for input(s): "LIPASE", "AMYLASE" in the last 168 hours. No results for input(s): "AMMONIA" in the last 168 hours. Coagulation Profile: No results for input(s): "INR", "PROTIME" in the last 168 hours. Cardiac Enzymes: No results for input(s): "CKTOTAL", "CKMB", "CKMBINDEX", "TROPONINI" in the last 168 hours. BNP (last 3 results) No results for input(s): "PROBNP" in the last 8760 hours. HbA1C: No results for input(s): "HGBA1C" in the last 72 hours. CBG: No results for input(s): "GLUCAP" in the last 168 hours. Lipid Profile: No results for input(s): "CHOL", "HDL", "LDLCALC", "TRIG", "CHOLHDL", "LDLDIRECT" in the last 72 hours. Thyroid Function Tests: No results for input(s): "TSH", "T4TOTAL", "FREET4", "T3FREE", "THYROIDAB" in the last 72 hours. Anemia Panel: No results for input(s): "VITAMINB12", "FOLATE", "FERRITIN", "TIBC", "IRON", "RETICCTPCT" in the last 72 hours. Urine analysis: No results found for: "COLORURINE", "APPEARANCEUR", "LABSPEC", "PHURINE", "GLUCOSEU", "HGBUR", "BILIRUBINUR",  "KETONESUR", "PROTEINUR", "UROBILINOGEN", "NITRITE", "LEUKOCYTESUR"  Radiological Exams on Admission: CT Angio Chest PE W and/or Wo Contrast  Result Date: 05/14/2022 CLINICAL DATA:  Pulmonary embolism (PE) suspected, positive D-dimer EXAM: CT ANGIOGRAPHY CHEST WITH CONTRAST TECHNIQUE: Multidetector CT imaging of the chest was performed using the standard protocol during bolus administration of intravenous contrast. Multiplanar CT image reconstructions and MIPs were obtained to evaluate the vascular anatomy. RADIATION DOSE REDUCTION: This exam was performed according to the departmental dose-optimization  program which includes automated exposure control, adjustment of the mA and/or kV according to patient size and/or use of iterative reconstruction technique. CONTRAST:  OMNIPAQUE IOHEXOL 350 MG/ML SOLN COMPARISON:  Chest XR, earlier same day and 01/13/2022. FINDINGS: Suboptimal evaluation, secondary to motion degradation and poor contrast opacification such that subsegmental emboli could missed. Cardiovascular: Satisfactory opacification of the pulmonary arteries to the segmental level. No evidence of segmental or larger pulmonary embolus. Normal heart size. No pericardial effusion. Incidental retropharyngeal course of the common carotid arteries. Mediastinum/Nodes: No enlarged mediastinal, hilar, or axillary lymph nodes. Incidental 1.7 cm RIGHT thyroid nodule. Trachea and esophagus demonstrate no significant findings. Lungs/Pleura: Low lung volume with focal LEFT lower lobe retrocardiac rounded consolidation in an area measuring approximately 5.5 cm without discrete lesion. No pleural effusion or pneumothorax. Upper Abdomen: No acute abnormality.  Partial gastrectomy change. Musculoskeletal: No chest wall abnormality. No acute or aggressive osseous findings. Review of the MIP images confirms the above findings. IMPRESSION: Suboptimal evaluation, within these constraints; 1. No segmental or larger  pulmonary embolus. 2. Hypoinflation with rounded LEFT lower lobe retrocardiac consolidation consistent with masslike lesion seen on earlier chest XR. Findings likely to represent round atelectasis. Consider short-term imaging follow-up with chest XR in 1 month after symptomatic clearance to ensure resolution. 3. 1.7 cm incidental right thyroid nodule. Recommend non-emergent, outpatient thyroid US for further characterization. Reference: J Am Coll Radiol. 2015 Feb;12(2): 143-50 Electronically Signed   By: Roanna Banning M.D.   On: 05/14/2022 09:00   DG Chest Portable 1 View  Result Date: 05/14/2022 CLINICAL DATA:  41 year old male with history of shortness of breath and chest pain. EXAM: PORTABLE CHEST 1 VIEW COMPARISON:  Chest x-ray 01/13/2022. FINDINGS: Images under penetrated, limiting the diagnostic sensitivity and specificity of the examination. With these limitations in mind, there is a mass-like retrocardiac opacity in the left lower lobe estimated to measure approximately 5.7 cm in diameter. Right lung is clear. No pleural effusions. No pneumothorax. No evidence of pulmonary edema. Heart size is normal. Upper mediastinal contours are within normal limits. IMPRESSION: 1. Limited under penetrated study demonstrating potential left lower lobe mass. Further evaluation with contrast enhanced chest CT is recommended to better evaluate this finding and exclude underlying malignancy. Electronically Signed   By: Trudie Reed M.D.   On: 05/14/2022 07:47    EKG: Independently reviewed.  Sinus tachycardia, left axis deviation.  Assessment/Plan Principal Problem:   CAP (community acquired pneumonia) Active Problems:   HLD (hyperlipidemia)   Morbid obesity (HCC)   Obstructive sleep apnea   HYPERTENSION, BENIGN ESSENTIAL   Prediabetes   Gastroesophageal reflux disease without esophagitis   Community acquired pneumonia  Chest pain: Likely musculoskeletal or due to pneumonia. Chest pain improved with  pain medication,  Troponin 31> 32, continue to monitor.  Sepsis secondary to community-acquired pneumonia: Patient presented with tachycardia, tachypnea, fever, leukocytosis, chest x-ray shows infiltrate. CT chest confirms left lower lobe opacity consistent with pneumonia. Patient remains on room air,  not hypoxic. Continue empiric antibiotics ceftriaxone and Zithromax. Continue antitussives.  COVID-negative.  Elevated troponin: Could be secondary to demand ischemia in the setting of pneumonia EKG: No acute ST-T wave changes.  Troponin flat 31>32 Continue to trend troponin  Consider cardio consult if trending up.  Essential hypertension Continue amlodipine, Coreg, valsartan, hydralazine.  Prediabetes: Hold metformin.  Obtain hemoglobin A1c  Morbid obesity: Diet and exercise discussed in detail.  Obstructive sleep apnea: Patient does not use CPAP at home  Hyperlipidemia Continue Lipitor:  CKD stage IIIb Serum creatinine at baseline.   Avoid nephrotoxic medications,  continue to monitor serum creatinine:  GERD: Continue Pantoprazole.  Chronic systolic CHF: Appears euvolemic, not in any exacerbation. BNP 108, echocardiogram 2014 showed LVEF 40 to 45% Obtain 2D echocardiogram.   DVT prophylaxis: Heparin sq Code Status: Full code Family Communication: No family at bed side Disposition Plan:   Status is: Inpatient Remains inpatient appropriate because: Admitted for community-acquired, requiring IV antibiotics.     Consults called: None Admission status: Inpatient   Shawna Clamp MD [facility]   If 7PM-7AM, please contact night-coverage   05/14/2022, 6:02 PM

## 2022-05-14 NOTE — Progress Notes (Signed)
Plan of Care Note for accepted transfer   Patient: Leonard Lewis MRN: [id number]   DOA: 05/14/2022  Facility requesting transfer: [facility].  Requesting Provider: Alvino Blood, MD Reason for transfer: Chest pain in the setting of CAP. Facility course:  Per EDP: "Chief Complaint(s) Chest Pain and Back Pain   HPI Leonard Lewis is a 41 y.o. male with history of CHF, obesity, hypertension presenting to the emergency department with chest pain.  Patient reports left lower chest and left upper abdominal pain which is worse with deep breathing.  Associated cough.  Reports chronic shortness of breath, roughly the same as at baseline.  He reports he has been taking his asthma inhalers as prescribed without significant improvement.  He denies cough.  No fevers or chills.  No nausea or vomiting.  No leg swelling, recent travel, recent surgeries.  He reports the pain is sharp.  He reports he had similar pain before and was told he had GERD."  The patient received ceftriaxone and azithromycin in the emergency department.  Lab work: D-dimer, quantitative [[id number]] (Abnormal)   Collected: 05/14/22 0758   Updated: 05/14/22 0809   Specimen Type: Blood   Specimen Source: Vein    D-Dimer, Quant 0.98 High  ug/mL-FEU  SARS Coronavirus 2 by RT PCR (hospital order, performed in [facility] hospital lab) *cepheid single result test* Anterior Nasal Swab [[id number]]   Collected: 05/14/22 0708   Updated: 05/14/22 0744   Specimen Source: Anterior Nasal Swab    SARS Coronavirus 2 by RT PCR NEGATIVE  Brain natriuretic peptide [[id number]] (Abnormal)   Collected: 05/14/22 0708   Updated: 05/14/22 0743   Specimen Type: Blood   Specimen Source: Vein    B Natriuretic Peptide 108.0 High  pg/mL  Troponin I (High Sensitivity) [[id number]] (Abnormal)   Collected: 05/14/22 0707   Updated: 05/14/22 0733   Specimen Source: Vein    Troponin I (High Sensitivity) 31 High  ng/L  Basic metabolic panel  [[id number]] (Abnormal)   Collected: 05/14/22 0707   Updated: 05/14/22 0732   Specimen Type: Blood   Specimen Source: Vein    Sodium 136 mmol/L   Potassium 3.9 mmol/L   Chloride 104 mmol/L   CO2 24 mmol/L   Glucose, Bld 119 High  mg/dL   BUN 22 High  mg/dL   Creatinine, Ser 9.62 High  mg/dL   Calcium 9.2 mg/dL   GFR, Estimated 47 Low  mL/min   Anion gap 8  CBC with Differential [[id number]] (Abnormal)   Collected: 05/14/22 0707   Updated: 05/14/22 0718   Specimen Type: Blood   Specimen Source: Vein    WBC 20.2 High  K/uL   RBC 5.83 High  MIL/uL   Hemoglobin 12.8 Low  g/dL   HCT 54.6 %   MCV 50.3 Low  fL   MCH 22.0 Low  pg   MCHC 31.5 g/dL   RDW 54.6 High  %   Platelets 260 K/uL   nRBC 0.0 %   Neutrophils Relative % 84 %   Neutro Abs 17.1 High  K/uL   Lymphocytes Relative 8 %   Lymphs Abs 1.6 K/uL   Monocytes Relative 7 %   Monocytes Absolute 1.4 High  K/uL   Eosinophils Relative 0 %   Eosinophils Absolute 0.0 K/uL   Basophils Relative 0 %   Basophils Absolute 0.1 K/uL   Immature Granulocytes 1 %   Abs Immature Granulocytes 0.11 High  K/uL   Imaging:  EXAM: PORTABLE  CHEST 1 VIEW   COMPARISON:  Chest x-ray 01/13/2022.   FINDINGS: Images under penetrated, limiting the diagnostic sensitivity and specificity of the examination. With these limitations in mind, there is a mass-like retrocardiac opacity in the left lower lobe estimated to measure approximately 5.7 cm in diameter. Right lung is clear. No pleural effusions. No pneumothorax. No evidence of pulmonary edema. Heart size is normal. Upper mediastinal contours are within normal limits.   IMPRESSION: 1. Limited under penetrated study demonstrating potential left lower lobe mass. Further evaluation with contrast enhanced chest CT is recommended to better evaluate this finding and exclude underlying malignancy.     Electronically Signed   By: Trudie Reed M.D.   On: 05/14/2022 07:47  CTA chest:    IMPRESSION: Suboptimal evaluation, within these constraints;   1. No segmental or larger pulmonary embolus. 2. Hypoinflation with rounded LEFT lower lobe retrocardiac consolidation consistent with masslike lesion seen on earlier chest XR. Findings likely to represent round atelectasis. Consider short-term imaging follow-up with chest XR in 1 month after symptomatic clearance to ensure resolution. 3. 1.7 cm incidental right thyroid nodule. Recommend non-emergent, outpatient thyroid US for further characterization. Reference: J Am Coll Radiol. 2015 Feb;12(2): 143-50     Electronically Signed   By: Roanna Banning M.D.   On: 05/14/2022 09:00   Plan of care: The patient is accepted for admission to Telemetry unit, at [facility].  If his second troponin level has a sharp increase, Dr. Suezanne Jacquet will notify us to request a higher level of care.  Author: Bobette Mo, MD 05/14/2022  Check [email or web address] for on-call coverage.  Nursing staff, Please call [facility] Admits & Consults System-Wide number on Amion as soon as patient's arrival, so appropriate admitting provider can evaluate the pt.

## 2022-05-14 NOTE — ED Notes (Signed)
To CT

## 2022-05-14 NOTE — ED Notes (Signed)
Urinal provide per pt request

## 2022-05-14 NOTE — ED Triage Notes (Signed)
Left side rib and back pain since 3 am, woke pt up from sleep.

## 2022-05-14 NOTE — ED Provider Notes (Signed)
[facility] EMERGENCY DEPARTMENT Provider Note  CSN: [id number] Arrival date & time: 05/14/22 1601  Chief Complaint(s) Chest Pain and Back Pain  HPI Leonard Lewis is a 41 y.o. male with history of CHF, obesity, hypertension presenting to the emergency department with chest pain.  Patient reports left lower chest and left upper abdominal pain which is worse with deep breathing.  Associated cough.  Reports chronic shortness of breath, roughly the same as at baseline.  He reports he has been taking his asthma inhalers as prescribed without significant improvement.  He denies cough.  No fevers or chills.  No nausea or vomiting.  No leg swelling, recent travel, recent surgeries.  He reports the pain is sharp.  He reports he had similar pain before and was told he had GERD.   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 Authorizing Provider  albuterol (VENTOLIN HFA) 108 (90  Base) MCG/ACT inhaler Inhale 2 puffs into the lungs every 6 (six) hours as needed for wheezing or shortness of breath. 06/30/21   Georganna Skeans, MD  amLODipine (NORVASC) 10 MG tablet TAKE 1 TABLET (10 MG TOTAL) BY MOUTH DAILY. 06/30/21 06/30/22  Georganna Skeans, MD  aspirin 81 MG EC tablet Take by mouth.    [provider]  atorvastatin (LIPITOR) 40 MG tablet Take 1 tablet (40 mg total) by mouth daily. 12/07/21   Georganna Skeans, MD  carvedilol (COREG) 25 MG tablet TAKE 1 TABLET (25 MG TOTAL) BY MOUTH 2 (TWO) TIMES DAILY. 06/30/21 06/30/22  Georganna Skeans, MD  cetirizine (ZYRTEC) 10 MG tablet Take 1 tablet (10 mg total) by mouth daily. 12/07/21   Georganna Skeans, MD  colchicine 0.6 MG tablet Take 1 tablet (0.6 mg total) by mouth daily. 02/12/22   Georganna Skeans, MD  cyclobenzaprine (FLEXERIL) 10 MG tablet TAKE 1 TABLET (10 MG TOTAL) BY MOUTH 3 (THREE) TIMES DAILY AS NEEDED FOR MUSCLE SPASMS. 06/30/21 06/30/22  Georganna Skeans, MD  Diclofenac Sodium (PENNSAID) 2 % SOLN Place 1 application onto the skin 2 (two) times daily. 06/20/20   Myra Rude, MD  FARXIGA 10 MG TABS tablet Take 1 tablet (10 mg total) by mouth daily. 04/29/20   Storm Frisk, MD  fluticasone (FLONASE) 50 MCG/ACT nasal spray Place 2 sprays into both nostrils daily. Patient not taking: Reported on 12/09/2021 12/26/19   Arvilla Market, MD  fluticasone (  FLOVENT HFA) 220 MCG/ACT inhaler Inhale 2 puffs into the lungs daily. 04/30/22   Georganna SkeansWilson, Amelia, MD  hydrALAZINE (APRESOLINE) 25 MG tablet TAKE 2 TABLETS (50 MG TOTAL) BY MOUTH 3 (THREE) TIMES DAILY. 11/04/20 11/04/21  Mayers, Cari S, PA-C  metFORMIN (GLUCOPHAGE) 500 MG tablet TAKE 1 TABLET (500 MG TOTAL) BY MOUTH DAILY WITH BREAKFAST. 04/30/22 04/30/23  Georganna SkeansWilson, Amelia, MD  omeprazole (PRILOSEC) 20 MG capsule Take 1 capsule (20 mg total) by mouth daily. Patient not taking: Reported on 12/09/2021 09/21/21   Georganna SkeansWilson, Amelia, MD  Respiratory Therapy Supplies [facility](CARETOUCH CPAP MASK WIPES)  MISC For cpap machine 07/29/21   Georganna SkeansWilson, Amelia, MD  sertraline (ZOLOFT) 100 MG tablet Take 1 tablet (100 mg total) by mouth daily. 02/12/22   Georganna SkeansWilson, Amelia, MD  spironolactone (ALDACTONE) 25 MG tablet TAKE 1 TABLET (25 MG TOTAL) BY MOUTH DAILY. 04/30/22 04/30/23  Georganna SkeansWilson, Amelia, MD  valsartan (DIOVAN) 320 MG tablet Take 1 tablet (320 mg total) by mouth daily. 05/04/22   Georganna SkeansWilson, Amelia, MD  Vitamin D, Ergocalciferol, (DRISDOL) 1.25 MG (50000 UNIT) CAPS capsule Take 1 capsule (50,000 Units total) by mouth every 7 (seven) days. 06/30/21   Georganna SkeansWilson, Amelia, MD                                                                                                                                    Past Surgical History Past Surgical History:  Procedure Laterality Date   CARDIAC CATHETERIZATION  Oct 2013   cardiac cath negative for obstructive disease -- did show end diastolic pressure secondary to systemic hypertension   LAPAROSCOPIC GASTRIC SLEEVE RESECTION N/A 01/27/2015   Procedure: LAPAROSCOPIC GASTRIC SLEEVE RESECTION WITH UPPER ENDOSCOPY;  Surgeon: Luretha MurphyMatthew Martin, MD;  Location: [facility];  Service: General;  Laterality: N/A;   LEFT HEART CATHETERIZATION WITH CORONARY ANGIOGRAM N/A 08/03/2012   Procedure: LEFT HEART CATHETERIZATION WITH CORONARY ANGIOGRAM;  Surgeon: Tonny BollmanMichael Cooper, MD;  [facility];  Service: Cardiovascular;  Laterality: N/A;   NO PAST SURGERIES     Family History Family History  Problem Relation Age of Onset   Hypertension Mother    Obesity Mother    Hypertension Father     Social History Social History   Tobacco Use   Smoking status: Former    Types: Cigarettes    Quit date: 10/04/2006    Years since quitting: 15.6   Smokeless tobacco: Never  Substance Use Topics   Alcohol use: No   Drug use: No   Allergies Patient has no known allergies.  Review of Systems Review of Systems  All other systems reviewed and are negative.   Physical Exam Vital Signs  I have  reviewed the triage vital signs BP (!) 149/96   Pulse (!) 112   Temp 98 F (36.7 C) (Oral)   Resp 16   Ht 6' (1.829 m)   Wt (!) 194.9 kg   SpO2 96%  BMI 58.28 kg/m  Physical Exam Vitals and nursing note reviewed.  Constitutional:      General: He is not in acute distress.    Appearance: Normal appearance.  HENT:     Mouth/Throat:     Mouth: Mucous membranes are moist.  Cardiovascular:     Rate and Rhythm: Regular rhythm. Tachycardia present.  Pulmonary:     Breath sounds: Normal breath sounds.     Comments: Increased work of breathing and mild tachypnea, but lungs clear throughout Abdominal:     General: Abdomen is flat.     Palpations: Abdomen is soft.     Tenderness: There is no abdominal tenderness.  Musculoskeletal:     Right lower leg: Edema present.     Left lower leg: Edema present.     Comments: Trace lower extremity edema bilaterally  Skin:    General: Skin is warm and dry.     Capillary Refill: Capillary refill takes less than 2 seconds.  Neurological:     Mental Status: He is alert and oriented to person, place, and time. Mental status is at baseline.  Psychiatric:        Mood and Affect: Mood normal.        Behavior: Behavior normal.     ED Results and Treatments Labs (all labs ordered are listed, but only abnormal results are displayed) Labs Reviewed  CBC WITH DIFFERENTIAL/PLATELET - Abnormal; Notable for the following components:      Result Value   WBC 20.2 (*)    RBC 5.83 (*)    Hemoglobin 12.8 (*)    MCV 69.6 (*)    MCH 22.0 (*)    RDW 16.1 (*)    Neutro Abs 17.1 (*)    Monocytes Absolute 1.4 (*)    Abs Immature Granulocytes 0.11 (*)    All other components within normal limits  BASIC METABOLIC PANEL - Abnormal; Notable for the following components:   Glucose, Bld 119 (*)    BUN 22 (*)    Creatinine, Ser 1.82 (*)    GFR, Estimated 47 (*)    All other components within normal limits  BRAIN NATRIURETIC PEPTIDE - Abnormal; Notable for  the following components:   B Natriuretic Peptide 108.0 (*)    All other components within normal limits  D-DIMER, QUANTITATIVE - Abnormal; Notable for the following components:   D-Dimer, Quant 0.98 (*)    All other components within normal limits  TROPONIN I (HIGH SENSITIVITY) - Abnormal; Notable for the following components:   Troponin I (High Sensitivity) 31 (*)    All other components within normal limits  SARS CORONAVIRUS 2 BY RT PCR  CULTURE, BLOOD (ROUTINE X 2)  CULTURE, BLOOD (ROUTINE X 2)  TROPONIN I (HIGH SENSITIVITY)                                                                                                                          Radiology CT Angio Chest PE W  and/or Wo Contrast  Result Date: 05/14/2022 CLINICAL DATA:  Pulmonary embolism (PE) suspected, positive D-dimer EXAM: CT ANGIOGRAPHY CHEST WITH CONTRAST TECHNIQUE: Multidetector CT imaging of the chest was performed using the standard protocol during bolus administration of intravenous contrast. Multiplanar CT image reconstructions and MIPs were obtained to evaluate the vascular anatomy. RADIATION DOSE REDUCTION: This exam was performed according to the departmental dose-optimization program which includes automated exposure control, adjustment of the mA and/or kV according to patient size and/or use of iterative reconstruction technique. CONTRAST:  OMNIPAQUE IOHEXOL 350 MG/ML SOLN COMPARISON:  Chest XR, earlier same day and 01/13/2022. FINDINGS: Suboptimal evaluation, secondary to motion degradation and poor contrast opacification such that subsegmental emboli could missed. Cardiovascular: Satisfactory opacification of the pulmonary arteries to the segmental level. No evidence of segmental or larger pulmonary embolus. Normal heart size. No pericardial effusion. Incidental retropharyngeal course of the common carotid arteries. Mediastinum/Nodes: No enlarged mediastinal, hilar, or axillary lymph nodes. Incidental 1.7 cm  RIGHT thyroid nodule. Trachea and esophagus demonstrate no significant findings. Lungs/Pleura: Low lung volume with focal LEFT lower lobe retrocardiac rounded consolidation in an area measuring approximately 5.5 cm without discrete lesion. No pleural effusion or pneumothorax. Upper Abdomen: No acute abnormality.  Partial gastrectomy change. Musculoskeletal: No chest wall abnormality. No acute or aggressive osseous findings. Review of the MIP images confirms the above findings. IMPRESSION: Suboptimal evaluation, within these constraints; 1. No segmental or larger pulmonary embolus. 2. Hypoinflation with rounded LEFT lower lobe retrocardiac consolidation consistent with masslike lesion seen on earlier chest XR. Findings likely to represent round atelectasis. Consider short-term imaging follow-up with chest XR in 1 month after symptomatic clearance to ensure resolution. 3. 1.7 cm incidental right thyroid nodule. Recommend non-emergent, outpatient thyroid US for further characterization. Reference: J Am Coll Radiol. 2015 Feb;12(2): 143-50 Electronically Signed   By: Roanna Banning M.D.   On: 05/14/2022 09:00   DG Chest Portable 1 View  Result Date: 05/14/2022 CLINICAL DATA:  41 year old male with history of shortness of breath and chest pain. EXAM: PORTABLE CHEST 1 VIEW COMPARISON:  Chest x-ray 01/13/2022. FINDINGS: Images under penetrated, limiting the diagnostic sensitivity and specificity of the examination. With these limitations in mind, there is a mass-like retrocardiac opacity in the left lower lobe estimated to measure approximately 5.7 cm in diameter. Right lung is clear. No pleural effusions. No pneumothorax. No evidence of pulmonary edema. Heart size is normal. Upper mediastinal contours are within normal limits. IMPRESSION: 1. Limited under penetrated study demonstrating potential left lower lobe mass. Further evaluation with contrast enhanced chest CT is recommended to better evaluate this finding and  exclude underlying malignancy. Electronically Signed   By: Trudie Reed M.D.   On: 05/14/2022 07:47    Pertinent labs & imaging results that were available during my care of the patient were reviewed by me and considered in my medical decision making (see MDM for details).  Medications Ordered in ED Medications  cefTRIAXone (ROCEPHIN) 1 g in sodium chloride 0.9 % 100 mL IVPB (has no administration in time range)  azithromycin (ZITHROMAX) 500 mg in sodium chloride 0.9 % 250 mL IVPB (500 mg Intravenous New Bag/Given 05/14/22 1032)  iohexol (OMNIPAQUE) 350 MG/ML injection 100 mL (100 mLs Intravenous Contrast Given 05/14/22 0832)  sodium chloride 0.9 % bolus 500 mL (500 mLs Intravenous New Bag/Given 05/14/22 1024)  Procedures .1-3 Lead EKG Interpretation  Performed by: Lonell Grandchild, MD Authorized by: Lonell Grandchild, MD     Interpretation: abnormal     ECG rate assessment: tachycardic     Rhythm: sinus rhythm     Ectopy: none     Conduction: normal     (including critical care time)  Medical Decision Making / ED Course   MDM:  41 year old male presenting with chest pain  Patient uncomfortable appearing with mildly increased work of breathing.  Vitals notable for tachycardia.  Only trace peripheral edema and lungs clear.  Differential includes ACS, although EKG not grossly ischemic, and appears similar to prior.  Also includes PE, with positive D-dimer, will obtain CTA chest given increased work of breathing, tachycardia despite low GFR I believe the benefit of diagnosis in this patient outweighs the risk of contrast injury given abnormal vital signs and lab testing concern for critical illness.  Differential also includes pneumonia, chest x-ray limited but shows possible mass in the left lower lung field.  Lower concern for CHF, BNP is mildly  elevated and patient is obese, so if workup otherwise unrevealing patient may need diuresis.  Given clinical presentation, likely will need admission for further evaluation.  Clinical Course as of 05/14/22 1048  Fri May 14, 2022  1046 Imaging with round atelectasis, given  clinical features of cough, elevated WBC count, suspect symptoms more likely related to community-acquired pneumonia.  Discussed with the hospitalist, who admit the patient.  Initiated antibiotics.  Given patient is CHF, cannot receive 30 cc/kg IV fluid bolus in setting of sepsis, will give 500 cc fluid bolus and reassess [WS]    Clinical Course User Index [WS] Lonell Grandchild, MD     Additional history obtained: -Additional history obtained from medical record -External records from outside source obtained and reviewed including: Chart review including previous notes, labs, imaging, consultation notes   Lab Tests: -I ordered, reviewed, and interpreted labs.   The pertinent results include:   Labs Reviewed  CBC WITH DIFFERENTIAL/PLATELET - Abnormal; Notable for the following components:      Result Value   WBC 20.2 (*)    RBC 5.83 (*)    Hemoglobin 12.8 (*)    MCV 69.6 (*)    MCH 22.0 (*)    RDW 16.1 (*)    Neutro Abs 17.1 (*)    Monocytes Absolute 1.4 (*)    Abs Immature Granulocytes 0.11 (*)    All other components within normal limits  BASIC METABOLIC PANEL - Abnormal; Notable for the following components:   Glucose, Bld 119 (*)    BUN 22 (*)    Creatinine, Ser 1.82 (*)    GFR, Estimated 47 (*)    All other components within normal limits  BRAIN NATRIURETIC PEPTIDE - Abnormal; Notable for the following components:   B Natriuretic Peptide 108.0 (*)    All other components within normal limits  D-DIMER, QUANTITATIVE - Abnormal; Notable for the following components:   D-Dimer, Quant 0.98 (*)    All other components within normal limits  TROPONIN I (HIGH SENSITIVITY) - Abnormal; Notable for the  following components:   Troponin I (High Sensitivity) 31 (*)    All other components within normal limits  SARS CORONAVIRUS 2 BY RT PCR  CULTURE, BLOOD (ROUTINE X 2)  CULTURE, BLOOD (ROUTINE X 2)  TROPONIN I (HIGH SENSITIVITY)      EKG   EKG Interpretation  Date/Time:  Friday May 14 2022 06:56:56  EDT Ventricular Rate:  110 PR Interval:  176 QRS Duration: 104 QT Interval:  348 QTC Calculation: 471 R Axis:   -39 Text Interpretation: Sinus tachycardia Probable left atrial enlargement Left axis deviation ST elev, probable normal early repol pattern Similar to prior Confirmed by Alvino Blood ([id number]) on 05/14/2022 8:08:36 AM         Imaging Studies ordered: I ordered imaging studies including CXR, CT chest I independently visualized and interpreted imaging. I agree with the radiologist interpretation   Medicines ordered and prescription drug management: Meds ordered this encounter  Medications   iohexol (OMNIPAQUE) 350 MG/ML injection 100 mL   cefTRIAXone (ROCEPHIN) 1 g in sodium chloride 0.9 % 100 mL IVPB    Order Specific Question:   Antibiotic Indication:    Answer:   CAP   azithromycin (ZITHROMAX) 500 mg in sodium chloride 0.9 % 250 mL IVPB   sodium chloride 0.9 % bolus 500 mL    -I have reviewed the patients home medicines and have made adjustments as needed    Consultations Obtained: I requested consultation with the hospitalist,  and discussed lab and imaging findings as well as pertinent plan - they recommend: Admission   Cardiac Monitoring: The patient was maintained on a cardiac monitor.  I personally viewed and interpreted the cardiac monitored which showed an underlying rhythm of: NSR  Social Determinants of Health:  Factors impacting patients care include: obesity   Reevaluation: After the interventions noted above, I reevaluated the patient and found that they have :improved  Co morbidities that complicate the patient evaluation  Past  Medical History:  Diagnosis Date   Arthritis    Asthma    Back pain    CHF (congestive heart failure) (HCC) 2008   EF 40-50%         Fatty liver    Hypertension    Leg edema    Obesity, morbid (HCC)    Obstructive sleep apnea    Prediabetes       Dispostion: I considered admission for this patient, and given his increased work of breathing, elevated WBC count, tachycardia, and pneumonia, will admit for close reassessment, IV antibiotic treatment, hemodynamic monitoring     Final Clinical Impression(s) / ED Diagnoses Final diagnoses:  Community acquired pneumonia of left lower lobe of lung     This chart was dictated using voice recognition software.  Despite best efforts to proofread,  errors can occur which can change the documentation meaning.    Lonell Grandchild, MD 05/14/22 1048

## 2022-05-15 ENCOUNTER — Inpatient Hospital Stay (HOSPITAL_COMMUNITY): Payer: BC Managed Care – PPO

## 2022-05-15 DIAGNOSIS — R079 Chest pain, unspecified: Secondary | ICD-10-CM

## 2022-05-15 DIAGNOSIS — J189 Pneumonia, unspecified organism: Secondary | ICD-10-CM | POA: Diagnosis not present

## 2022-05-15 LAB — BASIC METABOLIC PANEL
Anion gap: 10 (ref 5–15)
BUN: 24 mg/dL — ABNORMAL HIGH (ref 6–20)
CO2: 23 mmol/L (ref 22–32)
Calcium: 8.8 mg/dL — ABNORMAL LOW (ref 8.9–10.3)
Chloride: 106 mmol/L (ref 98–111)
Creatinine, Ser: 1.64 mg/dL — ABNORMAL HIGH (ref 0.61–1.24)
GFR, Estimated: 54 mL/min — ABNORMAL LOW (ref >60)
Glucose, Bld: 108 mg/dL — ABNORMAL HIGH (ref 70–99)
Potassium: 3.5 mmol/L (ref 3.5–5.1)
Sodium: 139 mmol/L (ref 135–145)

## 2022-05-15 LAB — ECHOCARDIOGRAM COMPLETE
Area-P 1/2: 5.13 cm2
Calc EF: 49.4 %
Height: 72 in
S' Lateral: 4 cm
Single Plane A2C EF: 44.7 %
Single Plane A4C EF: 51.5 %
Weight: 6875.2 oz

## 2022-05-15 LAB — PHOSPHORUS: Phosphorus: 3 mg/dL (ref 2.5–4.6)

## 2022-05-15 LAB — CBC
HCT: 35.7 % — ABNORMAL LOW (ref 39.0–52.0)
Hemoglobin: 11.1 g/dL — ABNORMAL LOW (ref 13.0–17.0)
MCH: 21.9 pg — ABNORMAL LOW (ref 26.0–34.0)
MCHC: 31.1 g/dL (ref 30.0–36.0)
MCV: 70.3 fL — ABNORMAL LOW (ref 80.0–100.0)
Platelets: 245 10*3/uL (ref 150–400)
RBC: 5.08 MIL/uL (ref 4.22–5.81)
RDW: 15.7 % — ABNORMAL HIGH (ref 11.5–15.5)
WBC: 23.8 10*3/uL — ABNORMAL HIGH (ref 4.0–10.5)
nRBC: 0 % (ref 0.0–0.2)

## 2022-05-15 LAB — HIV ANTIBODY (ROUTINE TESTING W REFLEX): HIV Screen 4th Generation wRfx: NONREACTIVE

## 2022-05-15 LAB — TROPONIN I (HIGH SENSITIVITY): Troponin I (High Sensitivity): 30 ng/L — ABNORMAL HIGH (ref <18)

## 2022-05-15 LAB — MAGNESIUM: Magnesium: 1.8 mg/dL (ref 1.7–2.4)

## 2022-05-15 MED ORDER — SODIUM CHLORIDE 0.9 % IV SOLN: INTRAVENOUS | Status: DC | PRN

## 2022-05-15 MED ORDER — CARVEDILOL 25 MG PO TABS
25.0000 mg | ORAL_TABLET | Freq: Two times a day (BID) | ORAL | Status: DC
  Administered 2022-05-15 – 2022-05-16 (×3): 25 mg via ORAL
  Filled 2022-05-15 (×3): qty 1

## 2022-05-15 MED ORDER — HYDRALAZINE HCL 25 MG PO TABS
25.0000 mg | ORAL_TABLET | Freq: Three times a day (TID) | ORAL | Status: DC
  Administered 2022-05-15 – 2022-05-16 (×4): 25 mg via ORAL
  Filled 2022-05-15 (×4): qty 1

## 2022-05-15 MED ORDER — IRBESARTAN 300 MG PO TABS
300.0000 mg | ORAL_TABLET | Freq: Every day | ORAL | Status: DC
  Administered 2022-05-15 – 2022-05-16 (×2): 300 mg via ORAL
  Filled 2022-05-15 (×2): qty 1

## 2022-05-15 NOTE — Progress Notes (Signed)
PROGRESS NOTE    Leonard Lewis  [id number] DOB: 09/08/1981 DOA: 05/14/2022 PCP: Dorna Mai, MD     Brief Narrative:  Leonard Lewis is a 41 y.o. male with PMH significant for chronic systolic CHF, morbid obesity, hypertension, GERD, obstructive sleep apnea, fatty liver, prediabetes presented in the ED with complaints of left-sided chest pain associated with cough and shortness of breath.  Patient reports he was in his usual state of health and has developed chest pain below left axillary area, denies any trauma, injury or  pulled muscle,  describes pain as 10 /10 on pain intensity and then he developed shortness of breath.  He reports having intermittent cough for few days.  He thought he might have GERD and took omeprazole without any relief.  He reports similar episode in the past and it got resolved with the omeprazole.  He went to [facility] where he was found to have fever 102.2, He was given some pain medications which resolved his pain.  Patient reports he is stressed because he does 2 jobs because of financial liabilities.  Chest x-ray revealed left lower lobe opacity/mass.  CTA chest showed rounded left lower lobe retrocardiac consolidation consistent with masslike lesion.  Patient was diagnosed with community-acquired pneumonia and started on Rocephin and azithromycin.  New events last 24 hours / Subjective: Patient reports feeling much better today.  Has no complaints of chest pain.  Remains on room air without distress.  Afebrile.  Assessment & Plan:  Principal Problem:   CAP (community acquired pneumonia) Active Problems:   HLD (hyperlipidemia)   Morbid obesity ([place])   Obstructive sleep apnea   HYPERTENSION, BENIGN ESSENTIAL   Prediabetes   Gastroesophageal reflux disease without esophagitis   Community acquired pneumonia   Sepsis secondary to community-acquired pneumonia, left lower lobe -COVID negative -Blood cultures pending -Continue Rocephin and  azithromycin -We will need repeat chest imaging as outpatient in 4 to 6 weeks to ensure resolution of consolidation and to rule out underlying malignancy  Chest pain -Noncardiac.  Troponin remains flat 31>> 32>> 30 -Resolved  Demand ischemia -Secondary to sepsis, pneumonia -Denies chest pain today  Hypertension -Norvasc, Coreg, valsartan, hydralazine  Prediabetes -A1c ordered   Hyperlipidemia -Lipitor  CKD stage IIIb -Baseline creatinine 1.5-1.7 -Stable  Chronic systolic heart failure -Appears euvolemic -Echocardiogram pending   OSA -He does not wear CPAP at home  Morbid obesity -Estimated body mass index is 58.28 kg/m as calculated from the following:   Height as of this encounter: 6' (1.829 m).   Weight as of this encounter: 194.9 kg.    DVT prophylaxis:  heparin injection 5,000 Units Start: 05/14/22 2200  Code Status: Full Family Communication: At bedside Disposition Plan:  Status is: Inpatient Remains inpatient appropriate because: IV antibiotics  Antimicrobials:  Anti-infectives (From admission, onward)    Start     Dose/Rate Route Frequency Ordered Stop   05/15/22 1000  cefTRIAXone (ROCEPHIN) 1 g in sodium chloride 0.9 % 100 mL IVPB        1 g 200 mL/hr over 30 Minutes Intravenous Daily 05/14/22 1549     05/15/22 1000  azithromycin (ZITHROMAX) 500 mg in sodium chloride 0.9 % 250 mL IVPB        500 mg 250 mL/hr over 60 Minutes Intravenous Every 24 hours 05/14/22 1634 05/20/22 0959   05/14/22 0945  cefTRIAXone (ROCEPHIN) 1 g in sodium chloride 0.9 % 100 mL IVPB  Status:  Discontinued  1 g 200 mL/hr over 30 Minutes Intravenous Every 24 hours 05/14/22 0932 05/14/22 1549   05/14/22 0945  azithromycin (ZITHROMAX) 500 mg in sodium chloride 0.9 % 250 mL IVPB        500 mg 250 mL/hr over 60 Minutes Intravenous  Once 05/14/22 0932 05/14/22 1137        Objective: Vitals:   05/15/22 0412 05/15/22 0642 05/15/22 0754 05/15/22 0808  BP: (!) 175/105  (!) 162/109    Pulse: 89 95 94 98  Resp:  (!) 22 18 (!) 28  Temp:  98.4 F (36.9 C)    TempSrc:  Oral    SpO2:  98% 96% 97%  Weight:      Height:        Intake/Output Summary (Last 24 hours) at 05/15/2022 1237 Last data filed at 05/15/2022 0950 Gross per 24 hour  Intake 1104.66 ml  Output 450 ml  Net 654.66 ml   Filed Weights   05/14/22 0655  Weight: (!) 194.9 kg    Examination:  General exam: Appears calm and comfortable, obese  Respiratory system: Clear to auscultation. Respiratory effort normal. No respiratory distress. No conversational dyspnea. Room air  Cardiovascular system: S1 & S2 heard, RRR. No murmurs. No pedal edema. Gastrointestinal system: Abdomen is nondistended, soft and nontender. Normal bowel sounds heard. Central nervous system: Alert and oriented. No focal neurological deficits. Speech clear.  Extremities: Symmetric in appearance  Skin: No rashes, lesions or ulcers on exposed skin  Psychiatry: Judgement and insight appear normal. Mood & affect appropriate.   Data Reviewed: I have personally reviewed following labs and imaging studies  CBC: Recent Labs  Lab 05/14/22 0707 05/15/22 0341  WBC 20.2* 23.8*  NEUTROABS 17.1*  --   HGB 12.8* 11.1*  HCT 40.6 35.7*  MCV 69.6* 70.3*  PLT 260 245   Basic Metabolic Panel: Recent Labs  Lab 05/14/22 0707 05/15/22 0341  NA 136 139  K 3.9 3.5  CL 104 106  CO2 24 23  GLUCOSE 119* 108*  BUN 22* 24*  CREATININE 1.82* 1.64*  CALCIUM 9.2 8.8*  MG  --  1.8  PHOS  --  3.0   GFR: Estimated Creatinine Clearance: 104.4 mL/min (A) (by C-G formula based on SCr of 1.64 mg/dL (H)). Liver Function Tests: No results for input(s): "AST", "ALT", "ALKPHOS", "BILITOT", "PROT", "ALBUMIN" in the last 168 hours. No results for input(s): "LIPASE", "AMYLASE" in the last 168 hours. No results for input(s): "AMMONIA" in the last 168 hours. Coagulation Profile: No results for input(s): "INR", "PROTIME" in the last 168  hours. Cardiac Enzymes: No results for input(s): "CKTOTAL", "CKMB", "CKMBINDEX", "TROPONINI" in the last 168 hours. BNP (last 3 results) No results for input(s): "PROBNP" in the last 8760 hours. HbA1C: No results for input(s): "HGBA1C" in the last 72 hours. CBG: No results for input(s): "GLUCAP" in the last 168 hours. Lipid Profile: No results for input(s): "CHOL", "HDL", "LDLCALC", "TRIG", "CHOLHDL", "LDLDIRECT" in the last 72 hours. Thyroid Function Tests: No results for input(s): "TSH", "T4TOTAL", "FREET4", "T3FREE", "THYROIDAB" in the last 72 hours. Anemia Panel: No results for input(s): "VITAMINB12", "FOLATE", "FERRITIN", "TIBC", "IRON", "RETICCTPCT" in the last 72 hours. Sepsis Labs: No results for input(s): "PROCALCITON", "LATICACIDVEN" in the last 168 hours.  Recent Results (from the past 240 hour(s))  SARS Coronavirus 2 by RT PCR (hospital order, performed in [facility] hospital lab) *cepheid single result test* Anterior Nasal Swab     Status: None   Collection Time: 05/14/22  7:08 AM   Specimen: Anterior Nasal Swab  Result Value Ref Range Status   SARS Coronavirus 2 by RT PCR NEGATIVE NEGATIVE Final    Comment: (NOTE) SARS-CoV-2 target nucleic acids are NOT DETECTED.  The SARS-CoV-2 RNA is generally detectable in upper and lower respiratory specimens during the acute phase of infection. The lowest concentration of SARS-CoV-2 viral copies this assay can detect is 250 copies / mL. A negative result does not preclude SARS-CoV-2 infection and should not be used as the sole basis for treatment or other patient management decisions.  A negative result may occur with improper specimen collection / handling, submission of specimen other than nasopharyngeal swab, presence of viral mutation(s) within the areas targeted by this assay, and inadequate number of viral copies (<250 copies / mL). A negative result must be combined with clinical observations, patient history, and  epidemiological information.  Fact Sheet for Patients:   https://www.patel.info/  Fact Sheet for Healthcare Providers: https://hall.com/  This test is not yet approved or  cleared by the [place] FDA and has been authorized for detection and/or diagnosis of SARS-CoV-2 by FDA under an Emergency Use Authorization (EUA).  This EUA will remain in effect (meaning this test can be used) for the duration of the COVID-19 declaration under Section 564(b)(1) of the Act, 21 U.S.C. section 360bbb-3(b)(1), unless the authorization is terminated or revoked sooner.  Performed at [facility], [address] [place], [place] [address]   Blood culture (routine x 2)     Status: None (Preliminary result)   Collection Time: 05/14/22 10:15 AM   Specimen: BLOOD  Result Value Ref Range Status   Specimen Description   Final    BLOOD RIGHT ANTECUBITAL Performed at [facility], [address] [place], [place] [address]    Special Requests   Final    BOTTLES DRAWN AEROBIC AND ANAEROBIC Blood Culture adequate volume Performed at [facility], [address] [place], [place] [address]    Culture   Final    NO GROWTH < 24 HOURS Performed at [facility], [place] [address], [place], [place] [address]    Report Status PENDING  Incomplete  Blood culture (routine x 2)     Status: None (Preliminary result)   Collection Time: 05/14/22  4:24 PM   Specimen: BLOOD  Result Value Ref Range Status   Specimen Description   Final    BLOOD RIGHT ANTECUBITAL Performed at [place] [address] [place], [place] [address]    Special Requests   Final    BOTTLES DRAWN AEROBIC ONLY Blood Culture adequate volume Performed at [place] [address] [place], [place] [address]    Culture   Final    NO GROWTH < 12 HOURS Performed at [place] [address], [place],  [place] [address]    Report Status PENDING  Incomplete      Radiology Studies: CT Angio Chest PE W and/or Wo Contrast  Result Date: 05/14/2022 CLINICAL DATA:  Pulmonary embolism (PE) suspected, positive D-dimer EXAM: CT ANGIOGRAPHY CHEST WITH CONTRAST TECHNIQUE: Multidetector CT imaging of the chest was performed using the standard protocol during bolus administration of intravenous contrast. Multiplanar CT image reconstructions and MIPs were obtained to evaluate the vascular anatomy. RADIATION DOSE REDUCTION: This exam was performed according to the departmental dose-optimization program which includes automated exposure control, adjustment of the mA and/or kV according to  patient size and/or use of iterative reconstruction technique. CONTRAST:  OMNIPAQUE IOHEXOL 350 MG/ML SOLN COMPARISON:  Chest XR, earlier same day and 01/13/2022. FINDINGS: Suboptimal evaluation, secondary to motion degradation and poor contrast opacification such that subsegmental emboli could missed. Cardiovascular: Satisfactory opacification of the pulmonary arteries to the segmental level. No evidence of segmental or larger pulmonary embolus. Normal heart size. No pericardial effusion. Incidental retropharyngeal course of the common carotid arteries. Mediastinum/Nodes: No enlarged mediastinal, hilar, or axillary lymph nodes. Incidental 1.7 cm RIGHT thyroid nodule. Trachea and esophagus demonstrate no significant findings. Lungs/Pleura: Low lung volume with focal LEFT lower lobe retrocardiac rounded consolidation in an area measuring approximately 5.5 cm without discrete lesion. No pleural effusion or pneumothorax. Upper Abdomen: No acute abnormality.  Partial gastrectomy change. Musculoskeletal: No chest wall abnormality. No acute or aggressive osseous findings. Review of the MIP images confirms the above findings. IMPRESSION: Suboptimal evaluation, within these constraints; 1. No segmental or larger pulmonary embolus. 2. Hypoinflation  with rounded LEFT lower lobe retrocardiac consolidation consistent with masslike lesion seen on earlier chest XR. Findings likely to represent round atelectasis. Consider short-term imaging follow-up with chest XR in 1 month after symptomatic clearance to ensure resolution. 3. 1.7 cm incidental right thyroid nodule. Recommend non-emergent, outpatient thyroid US for further characterization. Reference: J Am Coll Radiol. 2015 Feb;12(2): 143-50 Electronically Signed   By: Roanna Banning M.D.   On: 05/14/2022 09:00   DG Chest Portable 1 View  Result Date: 05/14/2022 CLINICAL DATA:  41 year old male with history of shortness of breath and chest pain. EXAM: PORTABLE CHEST 1 VIEW COMPARISON:  Chest x-ray 01/13/2022. FINDINGS: Images under penetrated, limiting the diagnostic sensitivity and specificity of the examination. With these limitations in mind, there is a mass-like retrocardiac opacity in the left lower lobe estimated to measure approximately 5.7 cm in diameter. Right lung is clear. No pleural effusions. No pneumothorax. No evidence of pulmonary edema. Heart size is normal. Upper mediastinal contours are within normal limits. IMPRESSION: 1. Limited under penetrated study demonstrating potential left lower lobe mass. Further evaluation with contrast enhanced chest CT is recommended to better evaluate this finding and exclude underlying malignancy. Electronically Signed   By: Trudie Reed M.D.   On: 05/14/2022 07:47      Scheduled Meds:  albuterol  2.5 mg Nebulization BID   amLODipine  10 mg Oral Daily   atorvastatin  40 mg Oral Daily   budesonide  0.5 mg Nebulization BID   carvedilol  25 mg Oral BID WC   docusate sodium  100 mg Oral BID   guaiFENesin  600 mg Oral BID   heparin  5,000 Units Subcutaneous Q8H   hydrALAZINE  25 mg Oral Q8H   pneumococcal 23 valent vaccine  0.5 mL Intramuscular Tomorrow-1000   sertraline  100 mg Oral Daily   Continuous Infusions:  sodium chloride 10 mL/hr at  05/15/22 1103   azithromycin 500 mg (05/15/22 1202)   cefTRIAXone (ROCEPHIN)  IV 1 g (05/15/22 1104)     LOS: 1 day     Noralee Stain, DO [facility] 05/15/2022, 12:37 PM   Available via Epic secure chat 7am-7pm After these hours, please refer to coverage provider listed on [email or web address]

## 2022-05-15 NOTE — Progress Notes (Signed)
Pt was falling asleep during his neb treatment and his RR increased.  Following his treatments, RT asked if he wanted to go back on CPAP to nap. Patient said yes.  RN notified.

## 2022-05-15 NOTE — Progress Notes (Signed)
  Echocardiogram 2D Echocardiogram has been performed.  Augustine Radar 05/15/2022, 10:50 AM

## 2022-05-16 DIAGNOSIS — J189 Pneumonia, unspecified organism: Secondary | ICD-10-CM | POA: Diagnosis not present

## 2022-05-16 LAB — BASIC METABOLIC PANEL
Anion gap: 6 (ref 5–15)
BUN: 24 mg/dL — ABNORMAL HIGH (ref 6–20)
CO2: 25 mmol/L (ref 22–32)
Calcium: 8.9 mg/dL (ref 8.9–10.3)
Chloride: 110 mmol/L (ref 98–111)
Creatinine, Ser: 1.71 mg/dL — ABNORMAL HIGH (ref 0.61–1.24)
GFR, Estimated: 51 mL/min — ABNORMAL LOW (ref >60)
Glucose, Bld: 96 mg/dL (ref 70–99)
Potassium: 4 mmol/L (ref 3.5–5.1)
Sodium: 141 mmol/L (ref 135–145)

## 2022-05-16 LAB — HEMOGLOBIN A1C
Hgb A1c MFr Bld: 5.5 % (ref 4.8–5.6)
Mean Plasma Glucose: 111.15 mg/dL

## 2022-05-16 LAB — CBC
HCT: 35.9 % — ABNORMAL LOW (ref 39.0–52.0)
Hemoglobin: 11 g/dL — ABNORMAL LOW (ref 13.0–17.0)
MCH: 22 pg — ABNORMAL LOW (ref 26.0–34.0)
MCHC: 30.6 g/dL (ref 30.0–36.0)
MCV: 71.8 fL — ABNORMAL LOW (ref 80.0–100.0)
Platelets: 253 10*3/uL (ref 150–400)
RBC: 5 MIL/uL (ref 4.22–5.81)
RDW: 16.1 % — ABNORMAL HIGH (ref 11.5–15.5)
WBC: 13.9 10*3/uL — ABNORMAL HIGH (ref 4.0–10.5)
nRBC: 0 % (ref 0.0–0.2)

## 2022-05-16 MED ORDER — AZITHROMYCIN 500 MG PO TABS: 500.0000 mg | ORAL_TABLET | Freq: Every day | ORAL | 0 refills | Status: AC

## 2022-05-16 MED ORDER — HYDRALAZINE HCL 25 MG PO TABS: 25.0000 mg | ORAL_TABLET | Freq: Three times a day (TID) | ORAL | 0 refills | Status: DC

## 2022-05-16 MED ORDER — CEPHALEXIN 500 MG PO CAPS: 500.0000 mg | ORAL_CAPSULE | Freq: Two times a day (BID) | ORAL | 0 refills | Status: AC

## 2022-05-16 NOTE — Plan of Care (Signed)
Discharge instructions reviewed with patient and wife, all questions answered.  Patient transported to main entrance via wheelchair to be taken home by wife.

## 2022-05-16 NOTE — Discharge Summary (Signed)
Physician Discharge Summary  TRUTH BAROT VFI:[id number] DOB: [birth year] DOA: 05/14/2022  PCP: Georganna Skeans, MD  Admit date: 05/14/2022 Discharge date: 05/16/2022  Admitted From: Home Disposition:  Home  Recommendations for Outpatient Follow-up:  Follow up with PCP in 1 week Follow up with Dr. Sharyn Lull, cardiology regarding echocardiogram findings Recommend to repeat chest x-ray in 4 to 6 weeks to ensure resolution of consolidation and to rule out underlying malignancy  Discharge Condition: Stable CODE STATUS: Full code Diet recommendation: Heart healthy diet  Brief/Interim Summary: Leonard Lewis is a 41 y.o. male with PMH significant for chronic systolic CHF, morbid obesity, hypertension, GERD, obstructive sleep apnea, fatty liver, prediabetes presented in the ED with complaints of left-sided chest pain associated with cough and shortness of breath.  Patient reports he was in his usual state of health and has developed chest pain below left axillary area, denies any trauma, injury or  pulled muscle,  describes pain as 10 /10 on pain intensity and then he developed shortness of breath.  He reports having intermittent cough for few days.  He thought he might have GERD and took omeprazole without any relief.  He reports similar episode in the past and it got resolved with the omeprazole.  He went to [facility] where he was found to have fever 102.2, He was given some pain medications which resolved his pain.  Patient reports he is stressed because he does 2 jobs because of financial liabilities.  Chest x-ray revealed left lower lobe opacity/mass.  CTA chest showed rounded left lower lobe retrocardiac consolidation consistent with masslike lesion.  Patient was diagnosed with community-acquired pneumonia and started on Rocephin and azithromycin.  Patient symptoms continue to improve.  WBC improved down to 13.9.  He remained stable on room air.  He was feeling well on day of discharge  without any physical complaints.  He will finish antibiotics as outpatient and have close follow-up with PCP as well as cardiology.  He denied any chest pain on 8/12 and 8/13.  Discharge Diagnoses:   Principal Problem:   CAP (community acquired pneumonia) Active Problems:   HLD (hyperlipidemia)   Morbid obesity (HCC)   Obstructive sleep apnea   HYPERTENSION, BENIGN ESSENTIAL   Chronic combined systolic (congestive) and diastolic (congestive) heart failure (HCC)   Prediabetes   Gastroesophageal reflux disease without esophagitis   Community acquired pneumonia   Sepsis secondary to community-acquired pneumonia, left lower lobe -COVID negative -Blood cultures negative to date -Rocephin and azithromycin --> Keflex and azithromycin as outpatient -We will need repeat chest imaging as outpatient in 4 to 6 weeks to ensure resolution of consolidation and to rule out underlying malignancy   Chest pain -Noncardiac.  Troponin remains flat 31>> 32>> 30 -Resolved   Demand ischemia -Secondary to sepsis, pneumonia -Denies chest pain    Hypertension -Norvasc, Coreg, valsartan, hydralazine   Prediabetes -A1c 5.5    Hyperlipidemia -Lipitor   CKD stage IIIb -Baseline creatinine 1.5-1.7 -Stable   Chronic systolic and diastolic heart failure -Appears euvolemic -Echocardiogram EF 45-50, inferior basal wall motion abnormality with moderate concentric left ventricular hypertrophy, grade 1 diastolic dysfunction -Compared to report of echocardiogram from 2016, at that time he had EF 40 to 45% with diffuse hypokinesis, akinesis of the basal inferior myocardium -Patient follows with Dr. Sharyn Lull as outpatient, needs close outpatient follow-up   OSA -He does not wear CPAP at home   Morbid obesity -Estimated body mass index is 58.28 kg/m as calculated from the  following:   Height as of this encounter: 6' (1.829 m).   Weight as of this encounter: 194.9 kg.    Discharge  Instructions  Discharge Instructions     (HEART FAILURE PATIENTS) Call MD:  Anytime you have any of the following symptoms: 1) 3 pound weight gain in 24 hours or 5 pounds in 1 week 2) shortness of breath, with or without a dry hacking cough 3) swelling in the hands, feet or stomach 4) if you have to sleep on extra pillows at night in order to breathe.   Complete by: As directed    Call MD for:  difficulty breathing, headache or visual disturbances   Complete by: As directed    Call MD for:  extreme fatigue   Complete by: As directed    Call MD for:  hives   Complete by: As directed    Call MD for:  persistant dizziness or light-headedness   Complete by: As directed    Call MD for:  persistant nausea and vomiting   Complete by: As directed    Call MD for:  severe uncontrolled pain   Complete by: As directed    Call MD for:  temperature >100.4   Complete by: As directed    Diet - low sodium heart healthy   Complete by: As directed    Discharge instructions   Complete by: As directed    You were cared for by a hospitalist during your hospital stay. If you have any questions about your discharge medications or the care you received while you were in the hospital after you are discharged, you can call the unit and ask to speak with the hospitalist on call if the hospitalist that took care of you is not available. Once you are discharged, your primary care physician will handle any further medical issues. Please note that NO REFILLS for any discharge medications will be authorized once you are discharged, as it is imperative that you return to your primary care physician (or establish a relationship with a primary care physician if you do not have one) for your aftercare needs so that they can reassess your need for medications and monitor your lab values.   Increase activity slowly   Complete by: As directed       Allergies as of 05/16/2022   No Known Allergies      Medication List      STOP taking these medications    albuterol 108 (90 Base) MCG/ACT inhaler Commonly known as: VENTOLIN HFA   cetirizine 10 MG tablet Commonly known as: ZYRTEC   colchicine 0.6 MG tablet   cyclobenzaprine 10 MG tablet Commonly known as: FLEXERIL   fluticasone 220 MCG/ACT inhaler Commonly known as: Flovent HFA   fluticasone 50 MCG/ACT nasal spray Commonly known as: FLONASE   omeprazole 20 MG capsule Commonly known as: PRILOSEC   Vitamin D (Ergocalciferol) 1.25 MG (50000 UNIT) Caps capsule Commonly known as: DRISDOL       TAKE these medications    amLODipine 10 MG tablet Commonly known as: NORVASC TAKE 1 TABLET (10 MG TOTAL) BY MOUTH DAILY.   atorvastatin 40 MG tablet Commonly known as: LIPITOR Take 1 tablet (40 mg total) by mouth daily.   azithromycin 500 MG tablet Commonly known as: Zithromax Take 1 tablet (500 mg total) by mouth daily for 3 days.   CareTouch CPAP Mask Wipes Misc For cpap machine   carvedilol 25 MG tablet Commonly known as: COREG TAKE 1 TABLET (  25 MG TOTAL) BY MOUTH 2 (TWO) TIMES DAILY.   cephALEXin 500 MG capsule Commonly known as: KEFLEX Take 1 capsule (500 mg total) by mouth 2 (two) times daily for 3 days.   Farxiga 10 MG Tabs tablet Generic drug: dapagliflozin propanediol Take 1 tablet (10 mg total) by mouth daily.   hydrALAZINE 25 MG tablet Commonly known as: APRESOLINE Take 1 tablet (25 mg total) by mouth 3 (three) times daily. What changed:  how much to take how to take this when to take this Another medication with the same name was removed. Continue taking this medication, and follow the directions you see here.   metFORMIN 500 MG tablet Commonly known as: GLUCOPHAGE TAKE 1 TABLET (500 MG TOTAL) BY MOUTH DAILY WITH BREAKFAST.   Pennsaid 2 % Soln Generic drug: diclofenac Sodium Place 1 application onto the skin 2 (two) times daily. What changed:  when to take this reasons to take this   sertraline 100 MG  tablet Commonly known as: ZOLOFT Take 1 tablet (100 mg total) by mouth daily.   spironolactone 25 MG tablet Commonly known as: ALDACTONE TAKE 1 TABLET (25 MG TOTAL) BY MOUTH DAILY.   valsartan 320 MG tablet Commonly known as: DIOVAN Take 1 tablet (320 mg total) by mouth daily.        Follow-up Information     Dorna Mai, MD. Schedule an appointment as soon as possible for a visit in 1 week(s).   Specialty: Family Medicine Why: Recommend to repeat chest x-ray in 4 to 6 weeks to ensure resolution of consolidation and to rule out underlying malignancy Contact information: [place] [address] [phone]         Charolette Forward, MD. Call in 1 week(s).   Specialty: Cardiology Why: Need follow up of echocardiogram findings Contact information: [address] [place] [address] [phone]                No Known Allergies  Consultations: None    Procedures/Studies: ECHOCARDIOGRAM COMPLETE  Result Date: 05/15/2022    ECHOCARDIOGRAM REPORT   Patient Name:   DORAL WENHOLD Date of Exam: 05/15/2022 Medical Rec #:  [id number]       Height:       72.0 in Accession #:    [id number]      Weight:       429.7 lb Date of Birth:  1981-04-04      BSA:          2.949 m Patient Age:    41 years        BP:           162/109 mmHg Patient Gender: M               HR:           88 bpm. Exam Location:  Inpatient Procedure: 2D Echo, Cardiac Doppler and Color Doppler Indications:    R07.9* Chest pain, unspecified  History:        Patient has prior history of Echocardiogram examinations, most                 recent 01/20/2015. CHF; Risk Factors:Sleep Apnea and                 Hypertension.  Sonographer:    Bernadene Person RDCS Referring Phys: [id number] [facility]  Sonographer Comments: Image acquisition challenging due to respiratory motion. IMPRESSIONS  1. Left ventricular ejection fraction,  by estimation, is 45 to 50%. The left ventricle  has mildly decreased function. The left ventricle demonstrates regional wall motion abnormalities (inferior basal). There is moderate concentric left ventricular hypertrophy. Left ventricular diastolic parameters are consistent with Grade I diastolic dysfunction (impaired relaxation).  2. Right ventricular systolic function is normal. The right ventricular size is normal.  3. The mitral valve is normal in structure. No evidence of mitral valve regurgitation. No evidence of mitral stenosis. Moderate mitral annular calcification.  4. The aortic valve was not well visualized. Aortic valve regurgitation is not visualized.  5. Aortic dilatation noted. There is mild dilatation of the aortic root, measuring 40 mm.  6. The inferior vena cava is normal in size with greater than 50% respiratory variability, suggesting right atrial pressure of 3 mmHg. Comparison(s): Prior images unable to be directly viewed, comparison made by report only. LVEF appears similar to prior report. FINDINGS  Left Ventricle: Left ventricular ejection fraction, by estimation, is 45 to 50%. The left ventricle has mildly decreased function. The left ventricle demonstrates regional wall motion abnormalities. The left ventricular internal cavity size was normal in size. There is moderate concentric left ventricular hypertrophy. Left ventricular diastolic parameters are consistent with Grade I diastolic dysfunction (impaired relaxation).  LV Wall Scoring: The posterior wall and basal inferior segment are hypokinetic. Right Ventricle: The right ventricular size is normal. No increase in right ventricular wall thickness. Right ventricular systolic function is normal. Left Atrium: Left atrial size was normal in size. Right Atrium: Right atrial size was normal in size. Pericardium: There is no evidence of pericardial effusion. Mitral Valve: The mitral valve is normal in structure. Moderate mitral annular calcification. No evidence of mitral valve  regurgitation. No evidence of mitral valve stenosis. Tricuspid Valve: The tricuspid valve is normal in structure. Tricuspid valve regurgitation is not demonstrated. No evidence of tricuspid stenosis. Aortic Valve: The aortic valve was not well visualized. Aortic valve regurgitation is not visualized. Pulmonic Valve: The pulmonic valve was normal in structure. Pulmonic valve regurgitation is not visualized. No evidence of pulmonic stenosis. Aorta: Aortic dilatation noted. There is mild dilatation of the aortic root, measuring 40 mm. Venous: The inferior vena cava is normal in size with greater than 50% respiratory variability, suggesting right atrial pressure of 3 mmHg. IAS/Shunts: No atrial level shunt detected by color flow Doppler.  LEFT VENTRICLE PLAX 2D LVIDd:         5.00 cm      Diastology LVIDs:         4.00 cm      LV e' medial:    6.41 cm/s LV PW:         1.30 cm      LV E/e' medial:  11.9 LV IVS:        1.30 cm      LV e' lateral:   9.00 cm/s LVOT diam:     2.30 cm      LV E/e' lateral: 8.5 LV SV:         84 LV SV Index:   28 LVOT Area:     4.15 cm  LV Volumes (MOD) LV vol d, MOD A2C: 199.0 ml LV vol d, MOD A4C: 188.0 ml LV vol s, MOD A2C: 110.0 ml LV vol s, MOD A4C: 91.2 ml LV SV MOD A2C:     89.0 ml LV SV MOD A4C:     188.0 ml LV SV MOD BP:      97.4 ml RIGHT VENTRICLE  RV S prime:     13.50 cm/s TAPSE (M-mode): 2.8 cm LEFT ATRIUM             Index        RIGHT ATRIUM           Index LA diam:        3.90 cm 1.32 cm/m   RA Area:     11.90 cm LA Vol (A2C):   58.4 ml 19.80 ml/m  RA Volume:   24.20 ml  8.21 ml/m LA Vol (A4C):   62.0 ml 21.03 ml/m LA Biplane Vol: 60.1 ml 20.38 ml/m  AORTIC VALVE LVOT Vmax:   99.40 cm/s LVOT Vmean:  72.000 cm/s LVOT VTI:    0.201 m  AORTA Ao Root diam: 4.00 cm Ao Asc diam:  3.60 cm MITRAL VALVE MV Area (PHT): 5.13 cm    SHUNTS MV Decel Time: 148 msec    Systemic VTI:  0.20 m MV E velocity: 76.30 cm/s  Systemic Diam: 2.30 cm MV A velocity: 66.40 cm/s MV E/A ratio:  1.15  Rudean Haskell MD Electronically signed by Rudean Haskell MD Signature Date/Time: 05/15/2022/1:34:22 PM    Final    CT Angio Chest PE W and/or Wo Contrast  Result Date: 05/14/2022 CLINICAL DATA:  Pulmonary embolism (PE) suspected, positive D-dimer EXAM: CT ANGIOGRAPHY CHEST WITH CONTRAST TECHNIQUE: Multidetector CT imaging of the chest was performed using the standard protocol during bolus administration of intravenous contrast. Multiplanar CT image reconstructions and MIPs were obtained to evaluate the vascular anatomy. RADIATION DOSE REDUCTION: This exam was performed according to the departmental dose-optimization program which includes automated exposure control, adjustment of the mA and/or kV according to patient size and/or use of iterative reconstruction technique. CONTRAST:  180mL OMNIPAQUE IOHEXOL 350 MG/ML SOLN COMPARISON:  Chest XR, earlier same day and 01/13/2022. FINDINGS: Suboptimal evaluation, secondary to motion degradation and poor contrast opacification such that subsegmental emboli could missed. Cardiovascular: Satisfactory opacification of the pulmonary arteries to the segmental level. No evidence of segmental or larger pulmonary embolus. Normal heart size. No pericardial effusion. Incidental retropharyngeal course of the common carotid arteries. Mediastinum/Nodes: No enlarged mediastinal, hilar, or axillary lymph nodes. Incidental 1.7 cm RIGHT thyroid nodule. Trachea and esophagus demonstrate no significant findings. Lungs/Pleura: Low lung volume with focal LEFT lower lobe retrocardiac rounded consolidation in an area measuring approximately 5.5 cm without discrete lesion. No pleural effusion or pneumothorax. Upper Abdomen: No acute abnormality.  Partial gastrectomy change. Musculoskeletal: No chest wall abnormality. No acute or aggressive osseous findings. Review of the MIP images confirms the above findings. IMPRESSION: Suboptimal evaluation, within these constraints; 1. No  segmental or larger pulmonary embolus. 2. Hypoinflation with rounded LEFT lower lobe retrocardiac consolidation consistent with masslike lesion seen on earlier chest XR. Findings likely to represent round atelectasis. Consider short-term imaging follow-up with chest XR in 1 month after symptomatic clearance to ensure resolution. 3. 1.7 cm incidental right thyroid nodule. Recommend non-emergent, outpatient thyroid US for further characterization. Reference: J Am Coll Radiol. 2015 Feb;12(2): 143-50 Electronically Signed   By: Michaelle Birks M.D.   On: 05/14/2022 09:00   DG Chest Portable 1 View  Result Date: 05/14/2022 CLINICAL DATA:  41 year old male with history of shortness of breath and chest pain. EXAM: PORTABLE CHEST 1 VIEW COMPARISON:  Chest x-ray 01/13/2022. FINDINGS: Images under penetrated, limiting the diagnostic sensitivity and specificity of the examination. With these limitations in mind, there is a mass-like retrocardiac opacity in the left lower lobe estimated to  measure approximately 5.7 cm in diameter. Right lung is clear. No pleural effusions. No pneumothorax. No evidence of pulmonary edema. Heart size is normal. Upper mediastinal contours are within normal limits. IMPRESSION: 1. Limited under penetrated study demonstrating potential left lower lobe mass. Further evaluation with contrast enhanced chest CT is recommended to better evaluate this finding and exclude underlying malignancy. Electronically Signed   By: Trudie Reed M.D.   On: 05/14/2022 07:47       Discharge Exam: Vitals:   05/16/22 0739 05/16/22 0906  BP:    Pulse:  88  Resp:    Temp:    SpO2: 97%     General: Pt is alert, awake, not in acute distress Cardiovascular: RRR, S1/S2 +, no edema Respiratory: CTA bilaterally, no wheezing, no rhonchi, no respiratory distress, no conversational dyspnea, on room air  Abdominal: Soft, NT, ND, bowel sounds + Extremities: no edema, no cyanosis Psych: Normal mood and affect,  stable judgement and insight     The results of significant diagnostics from this hospitalization (including imaging, microbiology, ancillary and laboratory) are listed below for reference.     Microbiology: Recent Results (from the past 240 hour(s))  SARS Coronavirus 2 by RT PCR (hospital order, performed in [facility] hospital lab) *cepheid single result test* Anterior Nasal Swab     Status: None   Collection Time: 05/14/22  7:08 AM   Specimen: Anterior Nasal Swab  Result Value Ref Range Status   SARS Coronavirus 2 by RT PCR NEGATIVE NEGATIVE Final    Comment: (NOTE) SARS-CoV-2 target nucleic acids are NOT DETECTED.  The SARS-CoV-2 RNA is generally detectable in upper and lower respiratory specimens during the acute phase of infection. The lowest concentration of SARS-CoV-2 viral copies this assay can detect is 250 copies / mL. A negative result does not preclude SARS-CoV-2 infection and should not be used as the sole basis for treatment or other patient management decisions.  A negative result may occur with improper specimen collection / handling, submission of specimen other than nasopharyngeal swab, presence of viral mutation(s) within the areas targeted by this assay, and inadequate number of viral copies (<250 copies / mL). A negative result must be combined with clinical observations, patient history, and epidemiological information.  Fact Sheet for Patients:   RoadLapTop.co.za  Fact Sheet for Healthcare Providers: http://kim-miller.com/  This test is not yet approved or  cleared by the [place] FDA and has been authorized for detection and/or diagnosis of SARS-CoV-2 by FDA under an Emergency Use Authorization (EUA).  This EUA will remain in effect (meaning this test can be used) for the duration of the COVID-19 declaration under Section 564(b)(1) of the Act, 21 U.S.C. section 360bbb-3(b)(1), unless the authorization  is terminated or revoked sooner.  Performed at [facility], [address] [place], [place] [address]   Blood culture (routine x 2)     Status: None (Preliminary result)   Collection Time: 05/14/22 10:15 AM   Specimen: BLOOD  Result Value Ref Range Status   Specimen Description   Final    BLOOD RIGHT ANTECUBITAL Performed at [facility], [address] [place], [place] [address]    Special Requests   Final    BOTTLES DRAWN AEROBIC AND ANAEROBIC Blood Culture adequate volume Performed at [facility], [address] [place], [place] [address]    Culture   Final    NO GROWTH 2 DAYS Performed at [facility], [address], [place], [place] [address]    Report Status PENDING  Incomplete  Blood culture (routine x 2)     Status: None (Preliminary result)   Collection Time: 05/14/22  4:24 PM   Specimen: BLOOD  Result Value Ref Range Status   Specimen Description   Final    BLOOD RIGHT ANTECUBITAL Performed at [facility] [address] [place], [place] [address]    Special Requests   Final    BOTTLES DRAWN AEROBIC ONLY Blood Culture adequate volume Performed at [facility] [address], [place], [place] [address]    Culture   Final    NO GROWTH 2 DAYS Performed at [facility] [address] [place], [place] [address]    Report Status PENDING  Incomplete     Labs: BNP (last 3 results) Recent Labs    05/14/22 0708  BNP [id number]*   Basic Metabolic Panel: Recent Labs  Lab 05/14/22 0707 05/15/22 0341 05/16/22 0358  NA 136 139 141  K 3.9 3.5 4.0  CL 104 106 110  CO2 24 23 25   GLUCOSE 119* 108* 96  BUN 22* 24* 24*  CREATININE 1.82* 1.64* 1.71*  CALCIUM 9.2 8.8* 8.9  MG  --  1.8  --   PHOS  --  3.0  --    Liver Function Tests: No results for input(s): "AST", "ALT", "ALKPHOS", "BILITOT", "PROT", "ALBUMIN" in the last 168 hours. No results for input(s): "LIPASE",  "AMYLASE" in the last 168 hours. No results for input(s): "AMMONIA" in the last 168 hours. CBC: Recent Labs  Lab 05/14/22 0707 05/15/22 0341 05/16/22 0358  WBC 20.2* 23.8* 13.9*  NEUTROABS 17.1*  --   --   HGB 12.8* 11.1* 11.0*  HCT 40.6 35.7* 35.9*  MCV 69.6* 70.3* 71.8*  PLT 260 245 253   Cardiac Enzymes: No results for input(s): "CKTOTAL", "CKMB", "CKMBINDEX", "TROPONINI" in the last 168 hours. BNP: Invalid input(s): "POCBNP" CBG: No results for input(s): "GLUCAP" in the last 168 hours. D-Dimer Recent Labs    05/14/22 0758  DDIMER 0.98*   Hgb A1c Recent Labs    05/16/22 0358  HGBA1C 5.5   Lipid Profile No results for input(s): "CHOL", "HDL", "LDLCALC", "TRIG", "CHOLHDL", "LDLDIRECT" in the last 72 hours. Thyroid function studies No results for input(s): "TSH", "T4TOTAL", "T3FREE", "THYROIDAB" in the last 72 hours.  Invalid input(s): "FREET3" Anemia work up No results for input(s): "VITAMINB12", "FOLATE", "FERRITIN", "TIBC", "IRON", "RETICCTPCT" in the last 72 hours. Urinalysis No results found for: "COLORURINE", "APPEARANCEUR", "LABSPEC", "PHURINE", "GLUCOSEU", "HGBUR", "BILIRUBINUR", "KETONESUR", "PROTEINUR", "UROBILINOGEN", "NITRITE", "LEUKOCYTESUR" Sepsis Labs Recent Labs  Lab 05/14/22 0707 05/15/22 0341 05/16/22 0358  WBC 20.2* 23.8* 13.9*   Microbiology Recent Results (from the past 240 hour(s))  SARS Coronavirus 2 by RT PCR (hospital order, performed in [facility] hospital lab) *cepheid single result test* Anterior Nasal Swab     Status: None   Collection Time: 05/14/22  7:08 AM   Specimen: Anterior Nasal Swab  Result Value Ref Range Status   SARS Coronavirus 2 by RT PCR NEGATIVE NEGATIVE Final    Comment: (NOTE) SARS-CoV-2 target nucleic acids are NOT DETECTED.  The SARS-CoV-2 RNA is generally detectable in upper and lower respiratory specimens during the acute phase of infection. The lowest concentration of SARS-CoV-2 viral copies this assay  can detect is 250 copies / mL. A negative result does not preclude SARS-CoV-2 infection and should not be used as the  sole basis for treatment or other patient management decisions.  A negative result may occur with improper specimen collection / handling, submission of specimen other than nasopharyngeal swab, presence of viral mutation(s) within the areas targeted by this assay, and inadequate number of viral copies (<250 copies / mL). A negative result must be combined with clinical observations, patient history, and epidemiological information.  Fact Sheet for Patients:   https://www.patel.info/  Fact Sheet for Healthcare Providers: https://hall.com/  This test is not yet approved or  cleared by the [place] FDA and has been authorized for detection and/or diagnosis of SARS-CoV-2 by FDA under an Emergency Use Authorization (EUA).  This EUA will remain in effect (meaning this test can be used) for the duration of the COVID-19 declaration under Section 564(b)(1) of the Act, 21 U.S.C. section 360bbb-3(b)(1), unless the authorization is terminated or revoked sooner.  Performed at [facility], [address] [place], [place] [address]   Blood culture (routine x 2)     Status: None (Preliminary result)   Collection Time: 05/14/22 10:15 AM   Specimen: BLOOD  Result Value Ref Range Status   Specimen Description   Final    BLOOD RIGHT ANTECUBITAL Performed at [facility], [address] [place], [place] [address]    Special Requests   Final    BOTTLES DRAWN AEROBIC AND ANAEROBIC Blood Culture adequate volume Performed at [facility], [address] [place], [place] [address]    Culture   Final    NO GROWTH 2 DAYS Performed at [facility], [place] [address] [place], [place] [address]    Report Status PENDING  Incomplete  Blood culture (routine x 2)     Status: None (Preliminary result)    Collection Time: 05/14/22  4:24 PM   Specimen: BLOOD  Result Value Ref Range Status   Specimen Description   Final    BLOOD RIGHT ANTECUBITAL Performed at [facility] [address], [place], [place] [address]    Special Requests   Final    BOTTLES DRAWN AEROBIC ONLY Blood Culture adequate volume Performed at [facility] [address], [place], [place] [address]    Culture   Final    NO GROWTH 2 DAYS Performed at [facility] [address], [place],  [address]    Report Status PENDING  Incomplete     Patient was seen and examined on the day of discharge and was found to be in stable condition. Time coordinating discharge: 25 minutes including assessment and coordination of care, as well as examination of the patient.   SIGNED:  Dessa Phi, DO [facility] 05/16/2022, 10:34 AM

## 2022-05-17 ENCOUNTER — Telehealth: Payer: Self-pay

## 2022-05-17 NOTE — Telephone Encounter (Signed)
Transition Care Management Unsuccessful Follow-up Telephone Call  Date of discharge and from where:  05/16/2022, [facility]  Attempts:  1st Attempt  Reason for unsuccessful TCM follow-up call:  Left voice message on # [phone], call back requested.  Need to schedule hospital follow up appointment with PCP.

## 2022-05-18 ENCOUNTER — Telehealth: Payer: Self-pay

## 2022-05-18 NOTE — Telephone Encounter (Signed)
Transition Care Management Unsuccessful Follow-up Telephone Call  Date of discharge and from where:  05/16/2022, [facility]  Attempts:  2nd Attempt  Reason for unsuccessful TCM follow-up call:  Left voice message on # [phone], call back requested.  Need to schedule hospital follow up appointment with PCP.

## 2022-05-19 ENCOUNTER — Telehealth: Payer: Self-pay

## 2022-05-19 LAB — CULTURE, BLOOD (ROUTINE X 2)
Culture: NO GROWTH
Culture: NO GROWTH
Special Requests: ADEQUATE
Special Requests: ADEQUATE

## 2022-05-19 NOTE — Telephone Encounter (Signed)
Transition Care Management Follow-up Telephone Call Date of discharge and from where: 05/16/2022, [facility]  How have you been since you were released from the hospital? He stated he is feeling okay,  Any questions or concerns? No  Items Reviewed: Did the pt receive and understand the discharge instructions provided? Yes  Medications obtained and verified? Yes - he said he has all of his medications.  He did not have any questions about the med regime.  Other? No  Any new allergies since your discharge? No  Dietary orders reviewed? Yes Do you have support at home? Yes   Home Care and Equipment/Supplies: Were home health services ordered? no If so, what is the name of the agency? N/a  Has the agency set up a time to come to the patient's home? not applicable Were any new equipment or medical supplies ordered?  No What is the name of the medical supply agency? N/a Were you able to get the supplies/equipment? not applicable Do you have any questions related to the use of the equipment or supplies? No  Functional Questionnaire: (I = Independent and D = Dependent) ADLs: independent.    Follow up appointments reviewed:  PCP Hospital f/u appt confirmed? Yes  Scheduled to see Dr Andrey Campanile - 06/10/2022.  Specialist Hospital f/u appt confirmed?  None scheduled at this time Are transportation arrangements needed? No  If their condition worsens, is the pt aware to call PCP or go to the Emergency Dept.? Yes Was the patient provided with contact information for the PCP's office or ED? Yes Was to pt encouraged to call back with questions or concerns? Yes

## 2022-06-09 ENCOUNTER — Ambulatory Visit
Admission: EM | Admit: 2022-06-09 | Discharge: 2022-06-09 | Disposition: A | Discharge status: Home / Self Care | Payer: BC Managed Care – PPO | Attending: Physician Assistant | Admitting: Physician Assistant

## 2022-06-09 DIAGNOSIS — M79642 Pain in left hand: Secondary | ICD-10-CM | POA: Diagnosis not present

## 2022-06-09 DIAGNOSIS — M79641 Pain in right hand: Secondary | ICD-10-CM | POA: Diagnosis not present

## 2022-06-09 MED ORDER — CYCLOBENZAPRINE HCL 10 MG PO TABS: 10.0000 mg | ORAL_TABLET | Freq: Two times a day (BID) | ORAL | 0 refills | Status: DC | PRN

## 2022-06-09 MED ORDER — PREDNISONE 20 MG PO TABS: 40.0000 mg | ORAL_TABLET | Freq: Every day | ORAL | 0 refills | Status: AC
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
## 2022-06-10 ENCOUNTER — Ambulatory Visit (INDEPENDENT_AMBULATORY_CARE_PROVIDER_SITE_OTHER): Payer: BC Managed Care – PPO | Admitting: Family Medicine

## 2022-06-10 ENCOUNTER — Encounter: Payer: Self-pay | Admitting: Family Medicine

## 2022-06-10 VITALS — BP 120/81 | HR 81 | Temp 97.7°F | Resp 18 | Wt >= 6400 oz

## 2022-06-10 DIAGNOSIS — I10 Essential (primary) hypertension: Secondary | ICD-10-CM | POA: Diagnosis not present

## 2022-06-10 DIAGNOSIS — F32A Depression, unspecified: Secondary | ICD-10-CM | POA: Diagnosis not present

## 2022-06-10 DIAGNOSIS — M792 Neuralgia and neuritis, unspecified: Secondary | ICD-10-CM

## 2022-06-10 DIAGNOSIS — Z6841 Body Mass Index (BMI) 40.0 and over, adult: Secondary | ICD-10-CM

## 2022-06-10 DIAGNOSIS — F419 Anxiety disorder, unspecified: Secondary | ICD-10-CM

## 2022-06-10 MED ORDER — SERTRALINE HCL 100 MG PO TABS: 100.0000 mg | ORAL_TABLET | Freq: Every day | ORAL | 1 refills | Status: DC

## 2022-06-10 MED ORDER — CLONAZEPAM 0.5 MG PO TABS: 0.5000 mg | ORAL_TABLET | Freq: Three times a day (TID) | ORAL | 0 refills | Status: DC | PRN
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
## 2022-06-30 ENCOUNTER — Ambulatory Visit: Payer: BC Managed Care – PPO | Admitting: Family Medicine

## 2022-07-19 ENCOUNTER — Ambulatory Visit: Payer: BC Managed Care – PPO | Admitting: Family Medicine

## 2022-09-21 ENCOUNTER — Encounter: Payer: Self-pay | Admitting: Family Medicine

## 2022-09-21 ENCOUNTER — Ambulatory Visit (INDEPENDENT_AMBULATORY_CARE_PROVIDER_SITE_OTHER): Payer: BC Managed Care – PPO | Admitting: Family Medicine

## 2022-09-21 VITALS — BP 159/113 | HR 90 | Temp 98.1°F | Resp 18

## 2022-09-21 DIAGNOSIS — M109 Gout, unspecified: Secondary | ICD-10-CM | POA: Diagnosis not present

## 2022-09-21 DIAGNOSIS — I10 Essential (primary) hypertension: Secondary | ICD-10-CM | POA: Diagnosis not present

## 2022-09-21 MED ORDER — COLCHICINE 0.6 MG PO TABS: 0.6000 mg | ORAL_TABLET | Freq: Every day | ORAL | 1 refills | Status: DC

## 2022-09-22 ENCOUNTER — Encounter: Payer: Self-pay | Admitting: Family Medicine

## 2022-09-22 NOTE — Progress Notes (Signed)
Established Patient Office Visit  Subjective    Patient ID: Leonard Lewis, male    DOB: 1981-08-24  Age: 41 y.o. MRN: [id number]  CC:  Chief Complaint  Patient presents with   Foot Injury    HPI ZIV WELCHEL presents for complaint of left foot pain. Denies known trauma or injury. He does relate eating high purines in last few days.    Outpatient Encounter Medications as of 09/21/2022  Medication Sig   colchicine 0.6 MG tablet Take 1 tablet (0.6 mg total) by mouth daily.   amLODipine (NORVASC) 10 MG tablet TAKE 1 TABLET (10 MG TOTAL) BY MOUTH DAILY.   atorvastatin (LIPITOR) 40 MG tablet Take 1 tablet (40 mg total) by mouth daily.   carvedilol (COREG) 25 MG tablet TAKE 1 TABLET (25 MG TOTAL) BY MOUTH 2 (TWO) TIMES DAILY.   clonazePAM (KLONOPIN) 0.5 MG tablet Take 1 tablet (0.5 mg total) by mouth 3 (three) times daily as needed for anxiety.   cyclobenzaprine (FLEXERIL) 10 MG tablet Take 1 tablet (10 mg total) by mouth 2 (two) times daily as needed for muscle spasms.   Diclofenac Sodium (PENNSAID) 2 % SOLN Place 1 application onto the skin 2 (two) times daily. (Patient taking differently: Place 1 application  onto the skin 2 (two) times daily as needed (pain).)   FARXIGA 10 MG TABS tablet Take 1 tablet (10 mg total) by mouth daily.   hydrALAZINE (APRESOLINE) 25 MG tablet Take 1 tablet (25 mg total) by mouth 3 (three) times daily.   metFORMIN (GLUCOPHAGE) 500 MG tablet TAKE 1 TABLET (500 MG TOTAL) BY MOUTH DAILY WITH BREAKFAST.   Respiratory Therapy Supplies (CARETOUCH CPAP MASK WIPES) MISC For cpap machine   sertraline (ZOLOFT) 100 MG tablet Take 1 tablet (100 mg total) by mouth daily.   spironolactone (ALDACTONE) 25 MG tablet TAKE 1 TABLET (25 MG TOTAL) BY MOUTH DAILY.   valsartan (DIOVAN) 320 MG tablet Take 1 tablet (320 mg total) by mouth daily.   No facility-administered encounter medications on file as of 09/21/2022.    Past Medical History:  Diagnosis Date   Arthritis     Asthma    Back pain    CHF (congestive heart failure) ([place]) 2008   EF 40-50%         Fatty liver    Hypertension    Leg edema    Obesity, morbid ([place])    Obstructive sleep apnea    Prediabetes     Past Surgical History:  Procedure Laterality Date   CARDIAC CATHETERIZATION  Oct 2013   cardiac cath negative for obstructive disease -- did show end diastolic pressure secondary to systemic hypertension   LAPAROSCOPIC GASTRIC SLEEVE RESECTION N/A 01/27/2015   Procedure: LAPAROSCOPIC GASTRIC SLEEVE RESECTION WITH UPPER ENDOSCOPY;  Surgeon: Johnathan Hausen, MD;  Location: [facility];  Service: General;  Laterality: N/A;   LEFT HEART CATHETERIZATION WITH CORONARY ANGIOGRAM N/A 08/03/2012   Procedure: LEFT HEART CATHETERIZATION WITH CORONARY ANGIOGRAM;  Surgeon: Sherren Mocha, MD;  Location: [facility] CATH LAB;  Service: Cardiovascular;  Laterality: N/A;   NO PAST SURGERIES      Family History  Problem Relation Age of Onset   Hypertension Mother    Obesity Mother    Hypertension Father     Social History   Socioeconomic History   Marital status: Married    Spouse name: Not on file   Number of children: Not on file   Years of education: Not on file  Highest education level: Not on file  Occupational History   Occupation: Orthoptist  Tobacco Use   Smoking status: Former    Types: Cigarettes    Quit date: 10/04/2006    Years since quitting: 15.9   Smokeless tobacco: Never  Substance and Sexual Activity   Alcohol use: No   Drug use: No   Sexual activity: Not on file  Other Topics Concern   Not on file  Social History Narrative   Employed:  Services [employer].    Social Determinants of Health   Financial Resource Strain: Not on file  Food Insecurity: Not on file  Transportation Needs: Not on file  Physical Activity: Not on file  Stress: Not on file  Social Connections: Not on file  Intimate Partner Violence: Not on file    Review of Systems  All other  systems reviewed and are negative.       Objective    BP (!) 159/113   Pulse 90   Temp 98.1 F (36.7 C) (Oral)   Resp 18   SpO2 94%   Physical Exam Vitals and nursing note reviewed.  Constitutional:      General: He is not in acute distress.    Appearance: He is obese.  Cardiovascular:     Rate and Rhythm: Normal rate and regular rhythm.  Pulmonary:     Effort: Pulmonary effort is normal. No respiratory distress.     Breath sounds: Normal breath sounds. No wheezing.  Musculoskeletal:     Comments: Left foot in a boot  Neurological:     General: No focal deficit present.     Mental Status: He is alert and oriented to person, place, and time.  Psychiatric:        Mood and Affect: Affect normal. Mood is anxious.        Speech: Speech normal.        Behavior: Behavior normal. Behavior is cooperative.         Assessment & Plan:   1. Essential hypertension Slightly elevated readings. Discussed compliance. ? Some elevation 2/2 sx.   2. Acute gout of left foot, unspecified cause Colchicine prescribed. Tylenol prn. monitor    Return in about 4 weeks (around 10/19/2022) for follow up.   Becky Sax, MD

## 2022-10-20 ENCOUNTER — Ambulatory Visit: Payer: BC Managed Care – PPO | Admitting: Family Medicine

## 2022-10-22 ENCOUNTER — Other Ambulatory Visit: Payer: Self-pay | Admitting: Family Medicine

## 2022-10-25 ENCOUNTER — Encounter: Payer: Self-pay | Admitting: Family Medicine

## 2022-10-25 ENCOUNTER — Ambulatory Visit
Admission: EM | Admit: 2022-10-25 | Discharge: 2022-10-25 | Discharge status: Against Medical Advice | Payer: BC Managed Care – PPO

## 2022-10-25 ENCOUNTER — Ambulatory Visit (INDEPENDENT_AMBULATORY_CARE_PROVIDER_SITE_OTHER): Payer: BC Managed Care – PPO | Admitting: Family Medicine

## 2022-10-25 VITALS — BP 164/93 | Temp 98.2°F | Resp 20 | Wt >= 6400 oz

## 2022-10-25 DIAGNOSIS — B349 Viral infection, unspecified: Secondary | ICD-10-CM | POA: Diagnosis not present

## 2022-10-25 DIAGNOSIS — I10 Essential (primary) hypertension: Secondary | ICD-10-CM

## 2022-10-25 NOTE — Progress Notes (Unsigned)
Patient cv/o possible post nasal drip . Patient also c/o possible flu like sx x 1 day.

## 2022-10-26 LAB — COVID-19, FLU A+B AND RSV
Influenza A, NAA: NOT DETECTED
Influenza B, NAA: NOT DETECTED
RSV, NAA: NOT DETECTED
SARS-CoV-2, NAA: NOT DETECTED

## 2022-10-27 ENCOUNTER — Encounter: Payer: Self-pay | Admitting: Family Medicine

## 2022-10-27 NOTE — Progress Notes (Signed)
Established Patient Office Visit  Subjective    Patient ID: Leonard Lewis, male    DOB: Mar 19, 1981  Age: 42 y.o. MRN: [id number]  CC:  Chief Complaint  Patient presents with   Cough    HPI Leonard Lewis presents with flu like sx for about a day. Patient denies known contacts or exposures.    Outpatient Encounter Medications as of 10/25/2022  Medication Sig   amLODipine (NORVASC) 10 MG tablet TAKE 1 TABLET (10 MG TOTAL) BY MOUTH DAILY.   atorvastatin (LIPITOR) 40 MG tablet Take 1 tablet (40 mg total) by mouth daily.   carvedilol (COREG) 25 MG tablet TAKE 1 TABLET (25 MG TOTAL) BY MOUTH 2 (TWO) TIMES DAILY.   clonazePAM (KLONOPIN) 0.5 MG tablet Take 1 tablet (0.5 mg total) by mouth 3 (three) times daily as needed for anxiety.   colchicine 0.6 MG tablet Take 1 tablet (0.6 mg total) by mouth daily.   cyclobenzaprine (FLEXERIL) 10 MG tablet Take 1 tablet (10 mg total) by mouth 2 (two) times daily as needed for muscle spasms.   Diclofenac Sodium (PENNSAID) 2 % SOLN Place 1 application onto the skin 2 (two) times daily. (Patient taking differently: Place 1 application  onto the skin 2 (two) times daily as needed (pain).)   FARXIGA 10 MG TABS tablet Take 1 tablet (10 mg total) by mouth daily.   hydrALAZINE (APRESOLINE) 25 MG tablet Take 1 tablet (25 mg total) by mouth 3 (three) times daily.   metFORMIN (GLUCOPHAGE) 500 MG tablet TAKE 1 TABLET (500 MG TOTAL) BY MOUTH DAILY WITH BREAKFAST.   Respiratory Therapy Supplies (CARETOUCH CPAP MASK WIPES) MISC For cpap machine   sertraline (ZOLOFT) 100 MG tablet Take 1 tablet (100 mg total) by mouth daily.   spironolactone (ALDACTONE) 25 MG tablet TAKE 1 TABLET (25 MG TOTAL) BY MOUTH DAILY.   valsartan (DIOVAN) 320 MG tablet Take 1 tablet (320 mg total) by mouth daily.   No facility-administered encounter medications on file as of 10/25/2022.    Past Medical History:  Diagnosis Date   Arthritis    Asthma    Back pain    CHF (congestive  heart failure) ([place]) 2008   EF 40-50%         Fatty liver    Hypertension    Leg edema    Obesity, morbid ([place])    Obstructive sleep apnea    Prediabetes     Past Surgical History:  Procedure Laterality Date   CARDIAC CATHETERIZATION  Oct 2013   cardiac cath negative for obstructive disease -- did show end diastolic pressure secondary to systemic hypertension   LAPAROSCOPIC GASTRIC SLEEVE RESECTION N/A 01/27/2015   Procedure: LAPAROSCOPIC GASTRIC SLEEVE RESECTION WITH UPPER ENDOSCOPY;  Surgeon: Johnathan Hausen, MD;  Location: [facility];  Service: General;  Laterality: N/A;   LEFT HEART CATHETERIZATION WITH CORONARY ANGIOGRAM N/A 08/03/2012   Procedure: LEFT HEART CATHETERIZATION WITH CORONARY ANGIOGRAM;  Surgeon: Sherren Mocha, MD;  Location: [facility] CATH LAB;  Service: Cardiovascular;  Laterality: N/A;   NO PAST SURGERIES      Family History  Problem Relation Age of Onset   Hypertension Mother    Obesity Mother    Hypertension Father     Social History   Socioeconomic History   Marital status: Married    Spouse name: Not on file   Number of children: Not on file   Years of education: Not on file   Highest education level: Not on file  Occupational History   Occupation: Orthoptist  Tobacco Use   Smoking status: Former    Types: Cigarettes    Quit date: 10/04/2006    Years since quitting: 16.0   Smokeless tobacco: Never  Substance and Sexual Activity   Alcohol use: No   Drug use: No   Sexual activity: Not on file  Other Topics Concern   Not on file  Social History Narrative   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    Social Determinants of Health   Financial Resource Strain: Not on file  Food Insecurity: Not on file  Transportation Needs: Not on file  Physical Activity: Not on file  Stress: Not on file  Social Connections: Not on file  Intimate Partner Violence: Not on file    Review of Systems  Constitutional:  Positive for fever and malaise/fatigue.   HENT:  Positive for congestion.   All other systems reviewed and are negative.       Objective    BP (!) 164/93   Temp 98.2 F (36.8 C) (Oral)   Resp 20   Wt (!) 421 lb (191 kg)   SpO2 96%   BMI 57.10 kg/m   Physical Exam Vitals and nursing note reviewed.  Constitutional:      General: He is not in acute distress.    Appearance: He is obese.  Cardiovascular:     Rate and Rhythm: Normal rate and regular rhythm.  Pulmonary:     Effort: Pulmonary effort is normal. No respiratory distress.     Breath sounds: Normal breath sounds. No wheezing.  Abdominal:     Palpations: Abdomen is soft.     Tenderness: There is no abdominal tenderness.  Neurological:     General: No focal deficit present.     Mental Status: He is alert and oriented to person, place, and time.  Psychiatric:        Mood and Affect: Mood and affect normal.        Speech: Speech normal.        Behavior: Behavior normal. Behavior is cooperative.         Assessment & Plan:   1. Viral syndrome Swab results pending. Conservative management recommended - COVID-19, Flu A+B and RSV  2. Uncontrolled hypertension Elevated readings continue. Compliance discussed. Patient referred to cardiology for further eval/mgt - Ambulatory referral to Cardiology  Return in about 4 weeks (around 11/22/2022) for follow up.   Becky Sax, MD

## 2022-11-11 ENCOUNTER — Ambulatory Visit (INDEPENDENT_AMBULATORY_CARE_PROVIDER_SITE_OTHER): Payer: BC Managed Care – PPO | Admitting: Family Medicine

## 2022-11-11 ENCOUNTER — Encounter: Payer: Self-pay | Admitting: Family Medicine

## 2022-11-11 VITALS — BP 151/96 | HR 84 | Ht 72.0 in | Wt >= 6400 oz

## 2022-11-11 DIAGNOSIS — F419 Anxiety disorder, unspecified: Secondary | ICD-10-CM | POA: Diagnosis not present

## 2022-11-11 DIAGNOSIS — F32A Depression, unspecified: Secondary | ICD-10-CM | POA: Diagnosis not present

## 2022-11-11 DIAGNOSIS — I10 Essential (primary) hypertension: Secondary | ICD-10-CM | POA: Diagnosis not present

## 2022-11-11 DIAGNOSIS — Z6841 Body Mass Index (BMI) 40.0 and over, adult: Secondary | ICD-10-CM

## 2022-11-11 MED ORDER — SPIRONOLACTONE 50 MG PO TABS: 50.0000 mg | ORAL_TABLET | Freq: Two times a day (BID) | ORAL | 1 refills | Status: DC

## 2022-11-11 MED ORDER — SERTRALINE HCL 100 MG PO TABS: 100.0000 mg | ORAL_TABLET | Freq: Every day | ORAL | 1 refills | Status: DC

## 2022-11-11 MED ORDER — CLONAZEPAM 0.5 MG PO TABS: 0.5000 mg | ORAL_TABLET | Freq: Three times a day (TID) | ORAL | 0 refills | Status: DC | PRN

## 2022-11-11 NOTE — Progress Notes (Signed)
Established Patient Office Visit  Subjective    Patient ID: Leonard Lewis, male    DOB: [birth year]  Age: 42 y.o. MRN: [id number]  CC:  Chief Complaint  Patient presents with   Hypertension    HPI Leonard Lewis presents for follow up of hypertension. Patient denies acute complaints or concerns.    Outpatient Encounter Medications as of 11/11/2022  Medication Sig   amLODipine (NORVASC) 10 MG tablet TAKE 1 TABLET (10 MG TOTAL) BY MOUTH DAILY.   atorvastatin (LIPITOR) 40 MG tablet Take 1 tablet (40 mg total) by mouth daily.   carvedilol (COREG) 25 MG tablet TAKE 1 TABLET (25 MG TOTAL) BY MOUTH 2 (TWO) TIMES DAILY.   Diclofenac Sodium (PENNSAID) 2 % SOLN Place 1 application onto the skin 2 (two) times daily. (Patient taking differently: Place 1 application  onto the skin 2 (two) times daily as needed (pain).)   hydrALAZINE (APRESOLINE) 25 MG tablet Take 1 tablet (25 mg total) by mouth 3 (three) times daily.   metFORMIN (GLUCOPHAGE) 500 MG tablet TAKE 1 TABLET (500 MG TOTAL) BY MOUTH DAILY WITH BREAKFAST.   spironolactone (ALDACTONE) 50 MG tablet Take 1 tablet (50 mg total) by mouth 2 (two) times daily.   valsartan (DIOVAN) 320 MG tablet Take 1 tablet (320 mg total) by mouth daily.   [DISCONTINUED] sertraline (ZOLOFT) 100 MG tablet Take 1 tablet (100 mg total) by mouth daily.   [DISCONTINUED] spironolactone (ALDACTONE) 25 MG tablet TAKE 1 TABLET (25 MG TOTAL) BY MOUTH DAILY.   clonazePAM (KLONOPIN) 0.5 MG tablet Take 1 tablet (0.5 mg total) by mouth 3 (three) times daily as needed for anxiety.   colchicine 0.6 MG tablet Take 1 tablet (0.6 mg total) by mouth daily. (Patient not taking: Reported on 11/11/2022)   cyclobenzaprine (FLEXERIL) 10 MG tablet Take 1 tablet (10 mg total) by mouth 2 (two) times daily as needed for muscle spasms. (Patient not taking: Reported on 11/11/2022)   FARXIGA 10 MG TABS tablet Take 1 tablet (10 mg total) by mouth daily. (Patient not taking: Reported on 11/11/2022)    Respiratory Therapy Supplies (CARETOUCH CPAP MASK WIPES) MISC For cpap machine (Patient not taking: Reported on 11/11/2022)   sertraline (ZOLOFT) 100 MG tablet Take 1 tablet (100 mg total) by mouth daily.   [DISCONTINUED] clonazePAM (KLONOPIN) 0.5 MG tablet Take 1 tablet (0.5 mg total) by mouth 3 (three) times daily as needed for anxiety. (Patient not taking: Reported on 11/11/2022)   No facility-administered encounter medications on file as of 11/11/2022.    Past Medical History:  Diagnosis Date   Arthritis    Asthma    Back pain    CHF (congestive heart failure) ([place]) 2008   EF 40-50%         Fatty liver    Hypertension    Leg edema    Obesity, morbid ([place])    Obstructive sleep apnea    Prediabetes     Past Surgical History:  Procedure Laterality Date   CARDIAC CATHETERIZATION  Oct 2013   cardiac cath negative for obstructive disease -- did show end diastolic pressure secondary to systemic hypertension   LAPAROSCOPIC GASTRIC SLEEVE RESECTION N/A 01/27/2015   Procedure: LAPAROSCOPIC GASTRIC SLEEVE RESECTION WITH UPPER ENDOSCOPY;  Surgeon: Johnathan Hausen, MD;  Location: [facility];  Service: General;  Laterality: N/A;   LEFT HEART CATHETERIZATION WITH CORONARY ANGIOGRAM N/A 08/03/2012   Procedure: LEFT HEART CATHETERIZATION WITH CORONARY ANGIOGRAM;  Surgeon: Sherren Mocha, MD;  Location: [facility]  CATH LAB;  Service: Cardiovascular;  Laterality: N/A;   NO PAST SURGERIES      Family History  Problem Relation Age of Onset   Hypertension Mother    Obesity Mother    Hypertension Father     Social History   Socioeconomic History   Marital status: Married    Spouse name: Not on file   Number of children: Not on file   Years of education: Not on file   Highest education level: Not on file  Occupational History   Occupation: Orthoptist  Tobacco Use   Smoking status: Former    Types: Cigarettes    Quit date: 10/04/2006    Years since quitting: 16.1   Smokeless tobacco:  Never  Vaping Use   Vaping Use: Never used  Substance and Sexual Activity   Alcohol use: No   Drug use: No   Sexual activity: Not on file  Other Topics Concern   Not on file  Social History Narrative   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    Social Determinants of Health   Financial Resource Strain: Not on file  Food Insecurity: Not on file  Transportation Needs: Not on file  Physical Activity: Not on file  Stress: Not on file  Social Connections: Not on file  Intimate Partner Violence: Not on file    Review of Systems  All other systems reviewed and are negative.       Objective    BP (!) 151/96   Pulse 84   Ht 6' (1.829 m)   Wt (!) 427 lb 3.2 oz (193.8 kg)   SpO2 98%   BMI 57.94 kg/m   Physical Exam Vitals and nursing note reviewed.  Constitutional:      General: He is not in acute distress.    Appearance: He is obese.  Cardiovascular:     Rate and Rhythm: Normal rate and regular rhythm.  Pulmonary:     Effort: Pulmonary effort is normal. No respiratory distress.     Breath sounds: Normal breath sounds. No wheezing.  Abdominal:     Palpations: Abdomen is soft.     Tenderness: There is no abdominal tenderness.  Neurological:     General: No focal deficit present.     Mental Status: He is alert and oriented to person, place, and time.  Psychiatric:        Mood and Affect: Affect normal. Mood is anxious.        Speech: Speech normal.        Behavior: Behavior normal. Behavior is cooperative.         Assessment & Plan:   1. Uncontrolled hypertension Slight improved but still elevated readings. Will increase Spironolactone from 25 to 50 mg BID. Referral to cardiology for further eval/mgt  - Ambulatory referral to Cardiology  2. Class 3 severe obesity due to excess calories with serious comorbidity and body mass index (BMI) of 50.0 to 59.9 in adult [facility]) Discussed dietary and activity options. Goal sis 4-6lbs/mo weight loss.   3. Anxiety and  depression Appears stable. Zoloft and clonazepam refilled.      Return in about 3 months (around 02/09/2023) for follow up.   Becky Sax, MD

## 2022-11-11 NOTE — Progress Notes (Signed)
No concerns. 

## 2022-11-12 ENCOUNTER — Encounter: Payer: Self-pay | Admitting: Family Medicine

## 2022-12-27 ENCOUNTER — Ambulatory Visit
Admission: EM | Admit: 2022-12-27 | Discharge: 2022-12-27 | Disposition: A | Discharge status: Home / Self Care | Payer: BC Managed Care – PPO | Attending: Physician Assistant | Admitting: Physician Assistant

## 2022-12-27 DIAGNOSIS — H66002 Acute suppurative otitis media without spontaneous rupture of ear drum, left ear: Secondary | ICD-10-CM | POA: Diagnosis not present

## 2022-12-27 DIAGNOSIS — H60392 Other infective otitis externa, left ear: Secondary | ICD-10-CM

## 2022-12-27 HISTORY — DX: Hyperlipidemia, unspecified: E78.5

## 2022-12-27 MED ORDER — AMOXICILLIN 500 MG PO CAPS: 500.0000 mg | ORAL_CAPSULE | Freq: Three times a day (TID) | ORAL | 0 refills | Status: DC

## 2022-12-27 MED ORDER — TOBRAMYCIN 0.3 % OP SOLN: 1.0000 [drp] | OPHTHALMIC | 0 refills | Status: AC

## 2022-12-27 NOTE — ED Triage Notes (Signed)
Pt c/o left otalgia onset ~ today. Reports have "a little issue with my neck" (left) and fatigue.

## 2022-12-27 NOTE — ED Provider Notes (Signed)
[facility]    CSN: [id number] Arrival date & time: 12/27/22  1801      History   Chief Complaint Chief Complaint  Patient presents with   Otalgia    HPI Leonard Lewis is a 42 y.o. male.   Patient complains of pain in his left ear.  Patient complains of soreness in the external ear and soreness from the left ear into his neck.  Patient denies any fever or chills he has not had any cough or congestion  The history is provided by the patient. No language interpreter was used.  Otalgia Location:  Left Quality:  Aching Severity:  Moderate Onset quality:  Gradual Timing:  Constant Chronicity:  New Relieved by:  Nothing Worsened by:  Nothing Ineffective treatments:  None tried Associated symptoms: ear discharge     Past Medical History:  Diagnosis Date   Arthritis    Asthma    Back pain    CHF (congestive heart failure) ([place]) 2008   EF 40-50%         Dyslipidemia    Fatty liver    Hypertension    Leg edema    Obesity, morbid ([place])    Obstructive sleep apnea    Prediabetes     Patient Active Problem List   Diagnosis Date Noted   CAP (community acquired pneumonia) 05/14/2022   Community acquired pneumonia 05/14/2022   Acute left-sided low back pain with left-sided sciatica 11/06/2020   Gastroesophageal reflux disease without esophagitis 11/06/2020   Diarrhea 11/06/2020   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   Left foot pain 05/23/2020   Tonsillar hypertrophy 01/24/2020   Vitamin D deficiency 06/19/2019   Prediabetes 01/03/2018   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 [id number]   Chronic systolic heart failure ([place]) 10/30/2013   Chronic combined systolic (congestive) and diastolic (congestive) heart failure ([place]) 10/30/2013   Obstructive sleep apnea 05/31/2007   HYPERTENSION, BENIGN ESSENTIAL 05/02/2007   HLD (hyperlipidemia) 03/17/2007   Morbid obesity ([place]) 03/17/2007    Past Surgical History:  Procedure Laterality  Date   CARDIAC CATHETERIZATION  Oct 2013   cardiac cath negative for obstructive disease -- did show end diastolic pressure secondary to systemic hypertension   LAPAROSCOPIC GASTRIC SLEEVE RESECTION N/A 01/27/2015   Procedure: LAPAROSCOPIC GASTRIC SLEEVE RESECTION WITH UPPER ENDOSCOPY;  Surgeon: Johnathan Hausen, MD;  Location: [facility];  Service: General;  Laterality: N/A;   LEFT HEART CATHETERIZATION WITH CORONARY ANGIOGRAM N/A 08/03/2012   Procedure: LEFT HEART CATHETERIZATION WITH CORONARY ANGIOGRAM;  Surgeon: Sherren Mocha, MD;  Location: [facility] CATH LAB;  Service: Cardiovascular;  Laterality: N/A;   NO PAST SURGERIES         Home Medications    Prior to Admission medications   Medication Sig Start Date End Date Taking? Authorizing Provider  amoxicillin (AMOXIL) 500 MG capsule Take 1 capsule (500 mg total) by mouth 3 (three) times daily. 12/27/22  Yes Caryl Ada K, PA-C  tobramycin (TOBREX) 0.3 % ophthalmic solution Place 1 drop into the right eye every 4 (four) hours for 10 days. 12/27/22 01/06/23 Yes Caryl Ada K, PA-C  amLODipine (NORVASC) 10 MG tablet TAKE 1 TABLET (10 MG TOTAL) BY MOUTH DAILY. 06/30/21 11/11/22  Dorna Mai, MD  atorvastatin (LIPITOR) 40 MG tablet Take 1 tablet (40 mg total) by mouth daily. 12/07/21   Dorna Mai, MD  carvedilol (COREG) 25 MG tablet TAKE 1 TABLET (25 MG TOTAL) BY MOUTH 2 (TWO) TIMES DAILY. 06/30/21 11/11/22  Redmond Pulling,  Clyde Canterbury, MD  clonazePAM (KLONOPIN) 0.5 MG tablet Take 1 tablet (0.5 mg total) by mouth 3 (three) times daily as needed for anxiety. 11/11/22   Dorna Mai, MD  colchicine 0.6 MG tablet Take 1 tablet (0.6 mg total) by mouth daily. Patient not taking: Reported on 11/11/2022 09/21/22   Dorna Mai, MD  cyclobenzaprine (FLEXERIL) 10 MG tablet Take 1 tablet (10 mg total) by mouth 2 (two) times daily as needed for muscle spasms. Patient not taking: Reported on 11/11/2022 06/09/22   Francene Finders, PA-C  Diclofenac Sodium (PENNSAID) 2 % SOLN Place 1  application onto the skin 2 (two) times daily. Patient taking differently: Place 1 application  onto the skin 2 (two) times daily as needed (pain). 06/20/20   Rosemarie Ax, MD  FARXIGA 10 MG TABS tablet Take 1 tablet (10 mg total) by mouth daily. Patient not taking: Reported on 11/11/2022 04/29/20   Elsie Stain, MD  hydrALAZINE (APRESOLINE) 25 MG tablet Take 1 tablet (25 mg total) by mouth 3 (three) times daily. 05/16/22 11/11/22  Dessa Phi, DO  metFORMIN (GLUCOPHAGE) 500 MG tablet TAKE 1 TABLET (500 MG TOTAL) BY MOUTH DAILY WITH BREAKFAST. 04/30/22 04/30/23  Dorna Mai, MD  Respiratory Therapy Supplies [facility] CPAP MASK WIPES) MISC For cpap machine Patient not taking: Reported on 11/11/2022 07/29/21   Dorna Mai, MD  sertraline (ZOLOFT) 100 MG tablet Take 1 tablet (100 mg total) by mouth daily. 11/11/22   Dorna Mai, MD  spironolactone (ALDACTONE) 50 MG tablet Take 1 tablet (50 mg total) by mouth 2 (two) times daily. 11/11/22   Dorna Mai, MD  valsartan (DIOVAN) 320 MG tablet Take 1 tablet (320 mg total) by mouth daily. 05/04/22   Dorna Mai, MD    Family History Family History  Problem Relation Age of Onset   Hypertension Mother    Obesity Mother    Hypertension Father     Social History Social History   Tobacco Use   Smoking status: Former    Types: Cigarettes    Quit date: 10/04/2006    Years since quitting: 16.2   Smokeless tobacco: Never  Vaping Use   Vaping Use: Never used  Substance Use Topics   Alcohol use: No   Drug use: No     Allergies   Patient has no known allergies.   Review of Systems Review of Systems  HENT:  Positive for ear discharge and ear pain.   All other systems reviewed and are negative.    Physical Exam Triage Vital Signs ED Triage Vitals  Enc Vitals Group     BP 12/27/22 1908 (!) 169/113     Pulse Rate 12/27/22 1908 70     Resp 12/27/22 1908 20     Temp 12/27/22 1908 97.6 F (36.4 C)     Temp Source 12/27/22 1908  Oral     SpO2 12/27/22 1908 95 %     Weight --      Height --      Head Circumference --      Peak Flow --      Pain Score 12/27/22 1906 4     Pain Loc --      Pain Edu? --      Excl. in [place]? --    No data found.  Updated Vital Signs BP (!) 169/113 (BP Location: Right Arm)   Pulse 70   Temp 97.6 F (36.4 C) (Oral)   Resp 20   SpO2 95%  Visual Acuity Right Eye Distance:   Left Eye Distance:   Bilateral Distance:    Right Eye Near:   Left Eye Near:    Bilateral Near:     Physical Exam Vitals and nursing note reviewed.  Constitutional:      Appearance: He is well-developed.  HENT:     Head: Normocephalic.     Ears:     Comments: Tender left external ear, swelling left ear canal, tympanic membrane appears erythematous    Nose: Nose normal.     Mouth/Throat:     Mouth: Mucous membranes are moist.  Pulmonary:     Effort: Pulmonary effort is normal.  Abdominal:     General: There is no distension.  Musculoskeletal:        General: Normal range of motion.     Cervical back: Normal range of motion.  Neurological:     Mental Status: He is alert and oriented to person, place, and time.      UC Treatments / Results  Labs (all labs ordered are listed, but only abnormal results are displayed) Labs Reviewed - No data to display  EKG   Radiology No results found.  Procedures Procedures (including critical care time)  Medications Ordered in UC Medications - No data to display  Initial Impression / Assessment and Plan / UC Course  I have reviewed the triage vital signs and the nursing notes.  Pertinent labs & imaging results that were available during my care of the patient were reviewed by me and considered in my medical decision making (see chart for details).      Final Clinical Impressions(s) / UC Diagnoses   Final diagnoses:  Non-recurrent acute suppurative otitis media of left ear without spontaneous rupture of tympanic membrane  Infective otitis  externa of left ear     Discharge Instructions      Return if any problems.    ED Prescriptions     Medication Sig Dispense Auth. Provider   amoxicillin (AMOXIL) 500 MG capsule Take 1 capsule (500 mg total) by mouth 3 (three) times daily. 30 capsule Jaymari Cromie K, PA-C   tobramycin (TOBREX) 0.3 % ophthalmic solution Place 1 drop into the right eye every 4 (four) hours for 10 days. 5 mL Fransico Meadow, [place]      PDMP not reviewed this encounter.   Fransico Meadow, [place] 12/27/22 1919

## 2022-12-27 NOTE — Discharge Instructions (Addendum)
Return if any problems.

## 2023-01-02 ENCOUNTER — Encounter: Payer: Self-pay | Admitting: Emergency Medicine

## 2023-01-02 ENCOUNTER — Other Ambulatory Visit: Payer: Self-pay | Admitting: Family Medicine

## 2023-01-02 ENCOUNTER — Other Ambulatory Visit: Payer: Self-pay

## 2023-01-02 ENCOUNTER — Ambulatory Visit
Admission: EM | Admit: 2023-01-02 | Discharge: 2023-01-02 | Disposition: A | Discharge status: Home / Self Care | Payer: BC Managed Care – PPO | Attending: Physician Assistant | Admitting: Physician Assistant

## 2023-01-02 DIAGNOSIS — M79671 Pain in right foot: Secondary | ICD-10-CM

## 2023-01-02 MED ORDER — COLCHICINE 0.6 MG PO TABS: 0.6000 mg | ORAL_TABLET | Freq: Every day | ORAL | 2 refills | Status: DC

## 2023-01-02 MED ORDER — HYDROCODONE-ACETAMINOPHEN 5-325 MG PO TABS: 1.0000 | ORAL_TABLET | ORAL | 0 refills | Status: DC | PRN

## 2023-01-02 NOTE — Discharge Instructions (Signed)
Return if any problems.

## 2023-01-02 NOTE — ED Triage Notes (Signed)
Pt here for right foot pain x 2 days; pt denies obvious injury; pt sts foot and ankle pain; pt wearing walking boot that he had from prior

## 2023-01-03 NOTE — ED Provider Notes (Signed)
[facility]    CSN: [id number] Arrival date & time: 01/02/23  1451      History   Chief Complaint Chief Complaint  Patient presents with   Foot Pain    HPI Leonard Lewis is a 42 y.o. male.   Patient complains of pain in his right foot.  Patient has a history of gout in his left foot.  Patient reports that this pain is more in his right heel.  Patient is also request of recheck of his left ear patient was previously seen by me and put on antibiotics and antibiotic eardrops.  Patient reports the ear feels full.  The history is provided by the patient. No language interpreter was used.  Foot Pain This is a new problem. The problem has not changed since onset.Nothing aggravates the symptoms. Nothing relieves the symptoms.    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   Left foot pain 05/23/2020   Tonsillar hypertrophy 01/24/2020   Vitamin D deficiency 06/19/2019   Prediabetes 01/03/2018   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 [id number]   Chronic systolic heart failure ([place]) 10/30/2013   Chronic combined systolic (congestive) and diastolic (congestive) heart failure 10/30/2013   Obstructive sleep apnea 05/31/2007   HYPERTENSION, BENIGN ESSENTIAL 05/02/2007   HLD (hyperlipidemia) 03/17/2007   Morbid obesity 03/17/2007    Past Surgical History:  Procedure Laterality Date    CARDIAC CATHETERIZATION  Oct 2013   cardiac cath negative for obstructive disease -- did show end diastolic pressure secondary to systemic hypertension   LAPAROSCOPIC GASTRIC SLEEVE RESECTION N/A 01/27/2015   Procedure: LAPAROSCOPIC GASTRIC SLEEVE RESECTION WITH UPPER ENDOSCOPY;  Surgeon: Johnathan Hausen, MD;  Location: [facility];  Service: General;  Laterality: N/A;   LEFT HEART CATHETERIZATION WITH CORONARY ANGIOGRAM N/A 08/03/2012   Procedure: LEFT HEART CATHETERIZATION WITH CORONARY ANGIOGRAM;  Surgeon: Sherren Mocha, MD;  Location: [facility] CATH LAB;  Service: Cardiovascular;  Laterality: N/A;   NO PAST SURGERIES         Home Medications    Prior to Admission medications   Medication Sig Start Date End Date Taking? Authorizing Provider  colchicine 0.6 MG tablet Take 1 tablet (0.6 mg total) by mouth daily. 01/02/23 01/02/24 Yes Fransico Meadow, PA-C  HYDROcodone-acetaminophen (NORCO/VICODIN) 5-325 MG tablet Take 1 tablet by mouth every 4 (four) hours as needed for moderate pain. 01/02/23 01/02/24 Yes Caryl Ada K, PA-C  amLODipine (NORVASC) 10 MG tablet TAKE 1 TABLET (10 MG TOTAL) BY MOUTH DAILY. 06/30/21 11/11/22  Dorna Mai, MD  amoxicillin (AMOXIL) 500 MG capsule Take 1 capsule (500 mg total) by mouth 3 (three) times daily. 12/27/22   Fransico Meadow, PA-C  atorvastatin (LIPITOR) 40 MG tablet Take 1 tablet (40 mg total) by mouth daily. 12/07/21   Dorna Mai,  MD  carvedilol (COREG) 25 MG tablet TAKE 1 TABLET (25 MG TOTAL) BY MOUTH 2 (TWO) TIMES DAILY. 06/30/21 11/11/22  Dorna Mai, MD  clonazePAM (KLONOPIN) 0.5 MG tablet Take 1 tablet (0.5 mg total) by mouth 3 (three) times daily as needed for anxiety. 11/11/22   Dorna Mai, MD  cyclobenzaprine (FLEXERIL) 10 MG tablet Take 1 tablet (10 mg total) by mouth 2 (two) times daily as needed for muscle spasms. Patient not taking: Reported on 11/11/2022 06/09/22   Francene Finders, PA-C  Diclofenac Sodium (PENNSAID) 2 % SOLN Place 1 application onto the  skin 2 (two) times daily. Patient taking differently: Place 1 application  onto the skin 2 (two) times daily as needed (pain). 06/20/20   Rosemarie Ax, MD  FARXIGA 10 MG TABS tablet Take 1 tablet (10 mg total) by mouth daily. Patient not taking: Reported on 11/11/2022 04/29/20   Elsie Stain, MD  hydrALAZINE (APRESOLINE) 25 MG tablet Take 1 tablet (25 mg total) by mouth 3 (three) times daily. 05/16/22 11/11/22  Dessa Phi, DO  metFORMIN (GLUCOPHAGE) 500 MG tablet TAKE 1 TABLET (500 MG TOTAL) BY MOUTH DAILY WITH BREAKFAST. 04/30/22 04/30/23  Dorna Mai, MD  Respiratory Therapy Supplies [facility] CPAP MASK WIPES) MISC For cpap machine Patient not taking: Reported on 11/11/2022 07/29/21   Dorna Mai, MD  sertraline (ZOLOFT) 100 MG tablet Take 1 tablet (100 mg total) by mouth daily. 11/11/22   Dorna Mai, MD  spironolactone (ALDACTONE) 50 MG tablet Take 1 tablet (50 mg total) by mouth 2 (two) times daily. 11/11/22   Dorna Mai, MD  tobramycin (TOBREX) 0.3 % ophthalmic solution Place 1 drop into the right eye every 4 (four) hours for 10 days. 12/27/22 01/06/23  Fransico Meadow, PA-C  valsartan (DIOVAN) 320 MG tablet Take 1 tablet (320 mg total) by mouth daily. 05/04/22   Dorna Mai, MD    Family History Family History  Problem Relation Age of Onset   Hypertension Mother    Obesity Mother    Hypertension Father     Social History Social History   Tobacco Use   Smoking status: Former    Types: Cigarettes    Quit date: 10/04/2006    Years since quitting: 16.2   Smokeless tobacco: Never  Vaping Use   Vaping Use: Never used  Substance Use Topics   Alcohol use: No   Drug use: No     Allergies   Patient has no known allergies.   Review of Systems Review of Systems  All other systems reviewed and are negative.    Physical Exam Triage Vital Signs ED Triage Vitals [01/02/23 1520]  Enc Vitals Group     BP (!) 193/112     Pulse Rate 90     Resp 18     Temp 97.7 F  (36.5 C)     Temp Source Oral     SpO2 95 %     Weight      Height      Head Circumference      Peak Flow      Pain Score 8     Pain Loc      Pain Edu?      Excl. in [place]?    No data found.  Updated Vital Signs BP (!) 193/112 (BP Location: Left Arm)   Pulse 90   Temp 97.7 F (36.5 C) (Oral)   Resp 18   SpO2 95%   Visual Acuity Right Eye  Distance:   Left Eye Distance:   Bilateral Distance:    Right Eye Near:   Left Eye Near:    Bilateral Near:     Physical Exam Vitals and nursing note reviewed.  Constitutional:      Appearance: He is well-developed.  HENT:     Head: Normocephalic.     Ears:     Comments: Left canal decreased swelling, there appears to be something blocking patient's tympanic membrane Pulmonary:     Effort: Pulmonary effort is normal.  Abdominal:     General: There is no distension.  Musculoskeletal:        General: Tenderness present. No swelling. Normal range of motion.     Cervical back: Normal range of motion.     Comments: Tender right heel no erythema no swelling  Neurological:     Mental Status: He is alert and oriented to person, place, and time.      UC Treatments / Results  Labs (all labs ordered are listed, but only abnormal results are displayed) Labs Reviewed - No data to display  EKG   Radiology No results found.  Procedures Procedures (including critical care time)  Medications Ordered in UC Medications - No data to display  Initial Impression / Assessment and Plan / UC Course  I have reviewed the triage vital signs and the nursing notes.  Pertinent labs & imaging results that were available during my care of the patient were reviewed by me and considered in my medical decision making (see chart for details).     I irrigated patient's left ear canal and a piece of tissue came out.  Patient's ear canal appears better than previous visit and tympanic membrane is normal Final Clinical Impressions(s) / UC Diagnoses    Final diagnoses:  Foot pain, right     Discharge Instructions      Return if any problems.     ED Prescriptions     Medication Sig Dispense Auth. Provider   colchicine 0.6 MG tablet Take 1 tablet (0.6 mg total) by mouth daily. 30 tablet Caryl Ada K, [place]   HYDROcodone-acetaminophen (NORCO/VICODIN) 5-325 MG tablet Take 1 tablet by mouth every 4 (four) hours as needed for moderate pain. 12 tablet Fransico Meadow, [place]      I have reviewed the PDMP during this encounter. An After Visit Summary was printed and given to the patient.        Fransico Meadow, [place] 01/03/23 1821

## 2023-01-17 ENCOUNTER — Encounter: Payer: Self-pay | Admitting: *Deleted

## 2023-02-01 ENCOUNTER — Ambulatory Visit: Payer: BC Managed Care – PPO | Admitting: Family Medicine

## 2023-02-02 ENCOUNTER — Ambulatory Visit
Admission: EM | Admit: 2023-02-02 | Discharge: 2023-02-02 | Disposition: A | Discharge status: Home / Self Care | Payer: BC Managed Care – PPO | Attending: Emergency Medicine | Admitting: Emergency Medicine

## 2023-02-02 DIAGNOSIS — M10071 Idiopathic gout, right ankle and foot: Secondary | ICD-10-CM

## 2023-02-02 MED ORDER — COLCHICINE 0.6 MG PO TABS: ORAL_TABLET | ORAL | 0 refills | Status: AC

## 2023-02-02 MED ORDER — PREDNISONE 20 MG PO TABS: 40.0000 mg | ORAL_TABLET | Freq: Every day | ORAL | 0 refills | Status: DC

## 2023-02-02 NOTE — ED Provider Notes (Signed)
[facility]    CSN: [id number] Arrival date & time: 02/02/23  1023      History   Chief Complaint Chief Complaint  Patient presents with   Foot Pain    HPI Leonard Lewis is a 42 y.o. male.   Patient presents for evaluation of right ankle and foot pain beginning 4 days ago.  Symptoms are worsened when bearing weight and when completing range of motion.  Pain has been constant.  Has attempted use of over-the-counter medications which has been ineffective.  Believes symptoms are related to his gout, endorses over the weekend he did go out of town, consumed increased amount of red meat which she knows is a trigger.  Denies numbness, tingling, injury or trauma.   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   Left foot pain 05/23/2020   Tonsillar hypertrophy 01/24/2020   Vitamin D deficiency 06/19/2019   Prediabetes 01/03/2018   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   Chronic systolic heart failure ([facility]) 10/30/2013   Chronic combined systolic (congestive) and diastolic (congestive) heart failure ([facility]) 10/30/2013   Obstructive sleep apnea 05/31/2007   HYPERTENSION, BENIGN ESSENTIAL 05/02/2007   HLD (hyperlipidemia) 03/17/2007   Morbid obesity ([facility]) 03/17/2007    Past Surgical History:  Procedure Laterality Date   CARDIAC CATHETERIZATION  Oct 2013   cardiac cath negative for  obstructive disease -- did show end diastolic pressure secondary to systemic hypertension   LAPAROSCOPIC GASTRIC SLEEVE RESECTION N/A 01/27/2015   Procedure: LAPAROSCOPIC GASTRIC SLEEVE RESECTION WITH UPPER ENDOSCOPY;  Surgeon: Luretha Murphy, MD;  Location: [facility];  Service: General;  Laterality: N/A;   LEFT HEART CATHETERIZATION WITH CORONARY ANGIOGRAM N/A 08/03/2012   Procedure: LEFT HEART CATHETERIZATION WITH CORONARY ANGIOGRAM;  Surgeon: Tonny Bollman, MD;  Location[facility] C[facility] LAB;  Service: Cardiovascular;  Laterality: N/A;   NO PAST SURGERIES         Home Medications    Prior to Admission medications   Medication Sig Start Date End Date Taking? Authorizing Provider  amLODipine (NORVASC) 10 MG tablet TAKE 1 TABLET (10 MG TOTAL) BY MOUTH DAILY. 06/30/21 11/11/22  Georganna Skeans, MD  amoxicillin (AMOXIL) 500 MG capsule Take 1 capsule (500 mg total) by mouth 3 (three) times daily. 12/27/22   Elson Areas, PA-C  atorvastatin (LIPITOR) 40 MG tablet Take 1 tablet (40 mg total) by mouth daily. 12/07/21   Georganna Skeans, MD  carvedilol (COREG) 25 MG tablet TAKE 1 TABLET (25 MG TOTAL) BY MOUTH 2 (TWO) TIMES DAILY. 06/30/21 11/11/22  Georganna Skeans, MD  clonazePAM (KLONOPIN) 0.5 MG tablet Take 1 tablet (0.5 mg total) by mouth 3 (three) times daily as needed for anxiety. 11/11/22   Georganna Skeans, MD  colchicine 0.6 MG tablet Take 1 tablet (0.6 mg total)  by mouth daily. 01/02/23 01/02/24  Elson Areas, PA-C  cyclobenzaprine (FLEXERIL) 10 MG tablet Take 1 tablet (10 mg total) by mouth 2 (two) times daily as needed for muscle spasms. Patient not taking: Reported on 11/11/2022 06/09/22   Tomi Bamberger, PA-C  Diclofenac Sodium (PENNSAID) 2 % SOLN Place 1 application onto the skin 2 (two) times daily. Patient taking differently: Place 1 application  onto the skin 2 (two) times daily as needed (pain). 06/20/20   Myra Rude, MD  FARXIGA 10 MG TABS tablet Take 1 tablet (10 mg total) by mouth daily. Patient  not taking: Reported on 11/11/2022 04/29/20   Storm Frisk, MD  hydrALAZINE (APRESOLINE) 25 MG tablet Take 1 tablet (25 mg total) by mouth 3 (three) times daily. 05/16/22 11/11/22  Noralee Stain, DO  HYDROcodone-acetaminophen (NORCO/VICODIN) 5-325 MG tablet Take 1 tablet by mouth every 4 (four) hours as needed for moderate pain. 01/02/23 01/02/24  Elson Areas, PA-C  metFORMIN (GLUCOPHAGE) 500 MG tablet TAKE 1 TABLET (500 MG TOTAL) BY MOUTH DAILY WITH BREAKFAST. 04/30/22 04/30/23  Georganna Skeans, MD  Respiratory Therapy Supplies [facility] CPAP MASK WIPES) MISC For cpap machine Patient not taking: Reported on 11/11/2022 07/29/21   Georganna Skeans, MD  sertraline (ZOLOFT) 100 MG tablet Take 1 tablet (100 mg total) by mouth daily. 11/11/22   Georganna Skeans, MD  spironolactone (ALDACTONE) 50 MG tablet Take 1 tablet (50 mg total) by mouth 2 (two) times daily. 11/11/22   Georganna Skeans, MD  valsartan (DIOVAN) 320 MG tablet Take 1 tablet (320 mg total) by mouth daily. 05/04/22   Georganna Skeans, MD    Family History Family History  Problem Relation Age of Onset   Hypertension Mother    Obesity Mother    Hypertension Father     Social History Social History   Tobacco Use   Smoking status: Former    Types: Cigarettes    Quit date: 10/04/2006    Years since quitting: 16.3   Smokeless tobacco: Never  Vaping Use   Vaping Use: Never used  Substance Use Topics   Alcohol use: No   Drug use: No     Allergies   Patient has no known allergies.   Review of Systems Review of Systems   Physical Exam Triage Vital Signs ED Triage Vitals  Enc Vitals Group     BP 02/02/23 1048 129/83     Pulse Rate 02/02/23 1048 88     Resp 02/02/23 1048 18     Temp 02/02/23 1048 98 F (36.7 C)     Temp Source 02/02/23 1048 Oral     SpO2 02/02/23 1048 95 %     Weight --      Height --      Head Circumference --      Peak Flow --      Pain Score 02/02/23 1049 7     Pain Loc --      Pain Edu? --      Excl. in  [place]? --    No data found.  Updated Vital Signs BP 129/83 (BP Location: Left Arm)   Pulse 88   Temp 98 F (36.7 C) (Oral)   Resp 18   SpO2 95%   Visual Acuity Right Eye Distance:   Left Eye Distance:   Bilateral Distance:    Right Eye Near:   Left Eye Near:    Bilateral Near:     Physical Exam Constitutional:  Appearance: Normal appearance.  Eyes:     Extraocular Movements: Extraocular movements intact.  Pulmonary:     Effort: Pulmonary effort is normal.  Musculoskeletal:     Comments: Tenderness is present to the base of the right fifth metatarsal of the right foot, mild swelling, no ecchymosis or deformity, able to bear weight and complete range of motion, 2+ pedal pulse  Neurological:     Mental Status: He is alert and oriented to person, place, and time. Mental status is at baseline.      UC Treatments / Results  Labs (all labs ordered are listed, but only abnormal results are displayed) Labs Reviewed - No data to display  EKG   Radiology No results found.  Procedures Procedures (including critical care time)  Medications Ordered in UC Medications - No data to display  Initial Impression / Assessment and Plan / UC Course  I have reviewed the triage vital signs and the nursing notes.  Pertinent labs & imaging results that were available during my care of the patient were reviewed by me and considered in my medical decision making (see chart for details).  Acute idiopathic gout of right foot  Presentation and symptomology is consistent with gout, due to lack of injury will defer imaging, patient has done well with colchicine and prednisone in the past therefore prescribed, recommended additional supportive measures through [facility], may follow-up with his urgent care or his primary doctor for further evaluation management if symptoms persist or worsen  Final Clinical Impressions(s) / UC Diagnoses   Final diagnoses:  None   Discharge Instructions    None    ED Prescriptions   None    PDMP not reviewed this encounter.   Valinda Hoar, NP 02/02/23 1217

## 2023-02-02 NOTE — ED Triage Notes (Signed)
Pt concerned for gout flare 2/2 right foot pain at a funeral on sat.

## 2023-02-02 NOTE — Discharge Instructions (Signed)
Today you are being treated for a gout flare  Take colchicine as directed to help to reduce symptoms  Begin prednisone every morning with food for 5 days to further help reduce inflammation  May elevate when sitting and lying to help reduce swelling  You may apply ice or heat over the affected area for additional comfort  Avoid triggers such as red meat, beer, high levels of caffeine, high levels of chocolate  You may follow-up with his urgent care or your primary doctor for further evaluation and management as needed

## 2023-02-10 ENCOUNTER — Ambulatory Visit (INDEPENDENT_AMBULATORY_CARE_PROVIDER_SITE_OTHER): Payer: BC Managed Care – PPO | Admitting: Family Medicine

## 2023-02-10 VITALS — BP 138/95 | HR 75 | Temp 98.1°F | Resp 16 | Wt >= 6400 oz

## 2023-02-10 DIAGNOSIS — Z6841 Body Mass Index (BMI) 40.0 and over, adult: Secondary | ICD-10-CM

## 2023-02-10 DIAGNOSIS — Z7689 Persons encountering health services in other specified circumstances: Secondary | ICD-10-CM | POA: Diagnosis not present

## 2023-02-10 DIAGNOSIS — R7303 Prediabetes: Secondary | ICD-10-CM

## 2023-02-10 DIAGNOSIS — M109 Gout, unspecified: Secondary | ICD-10-CM | POA: Diagnosis not present

## 2023-02-10 DIAGNOSIS — I10 Essential (primary) hypertension: Secondary | ICD-10-CM

## 2023-02-10 MED ORDER — ALLOPURINOL 100 MG PO TABS: 100.0000 mg | ORAL_TABLET | Freq: Every day | ORAL | 2 refills | Status: DC

## 2023-02-10 MED ORDER — AMLODIPINE BESYLATE 10 MG PO TABS: ORAL_TABLET | Freq: Every day | ORAL | 0 refills | Status: DC

## 2023-02-10 MED ORDER — PHENTERMINE HCL 37.5 MG PO TABS: 37.5000 mg | ORAL_TABLET | Freq: Every day | ORAL | 0 refills | Status: DC

## 2023-02-10 MED ORDER — METFORMIN HCL 500 MG PO TABS: ORAL_TABLET | Freq: Every day | ORAL | 0 refills | Status: DC

## 2023-02-10 MED ORDER — CARVEDILOL 25 MG PO TABS: ORAL_TABLET | Freq: Two times a day (BID) | ORAL | 0 refills | Status: DC

## 2023-02-10 NOTE — Progress Notes (Signed)
Patient is here for their 3 month follow-up Patient has no concerns today Care gaps have been discussed with patient  

## 2023-02-16 ENCOUNTER — Encounter: Payer: Self-pay | Admitting: Family Medicine

## 2023-02-16 NOTE — Progress Notes (Signed)
Established Patient Office Visit  Subjective    Patient ID: Leonard Lewis, male    DOB: Jul 08, 1981  Age: 42 y.o. MRN: [id number]  CC: No chief complaint on file.   HPI Leonard Lewis presents for follow up of chronic med issues. He recently was seen at [facility] for recurrent gout issues.    Outpatient Encounter Medications as of 02/10/2023  Medication Sig   allopurinol (ZYLOPRIM) 100 MG tablet Take 1 tablet (100 mg total) by mouth daily.   fluticasone (FLONASE) 50 MCG/ACT nasal spray 1 spray Once Daily for 14 days.   phentermine (ADIPEX-P) 37.5 MG tablet Take 1 tablet (37.5 mg total) by mouth daily before breakfast.   amLODipine (NORVASC) 10 MG tablet TAKE 1 TABLET (10 MG TOTAL) BY MOUTH DAILY.   amoxicillin (AMOXIL) 500 MG capsule Take 1 capsule (500 mg total) by mouth 3 (three) times daily.   atorvastatin (LIPITOR) 40 MG tablet Take 1 tablet (40 mg total) by mouth daily.   carvedilol (COREG) 25 MG tablet TAKE 1 TABLET (25 MG TOTAL) BY MOUTH 2 (TWO) TIMES DAILY.   clonazePAM (KLONOPIN) 0.5 MG tablet Take 1 tablet (0.5 mg total) by mouth 3 (three) times daily as needed for anxiety.   colchicine 0.6 MG tablet Take 1.2 mg ( 2 tablets)  by mouth  as a single dose at the first sign of gout flare, followed by 0.6 mg ( 1 tablet)  BY MOUTH 1 hour later, then 0.6 mg ( 1 tablet) daily for 6 days   cyclobenzaprine (FLEXERIL) 10 MG tablet Take 1 tablet (10 mg total) by mouth 2 (two) times daily as needed for muscle spasms. (Patient not taking: Reported on 11/11/2022)   Diclofenac Sodium (PENNSAID) 2 % SOLN Place 1 application onto the skin 2 (two) times daily. (Patient taking differently: Place 1 application  onto the skin 2 (two) times daily as needed (pain).)   FARXIGA 10 MG TABS tablet Take 1 tablet (10 mg total) by mouth daily. (Patient not taking: Reported on 11/11/2022)   hydrALAZINE (APRESOLINE) 25 MG tablet Take 1 tablet (25 mg total) by mouth 3 (three) times daily.   HYDROcodone-acetaminophen  (NORCO/VICODIN) 5-325 MG tablet Take 1 tablet by mouth every 4 (four) hours as needed for moderate pain.   metFORMIN (GLUCOPHAGE) 500 MG tablet TAKE 1 TABLET (500 MG TOTAL) BY MOUTH DAILY WITH BREAKFAST.   predniSONE (DELTASONE) 20 MG tablet Take 2 tablets (40 mg total) by mouth daily.   Respiratory Therapy Supplies (CARETOUCH CPAP MASK WIPES) MISC For cpap machine (Patient not taking: Reported on 11/11/2022)   sertraline (ZOLOFT) 100 MG tablet Take 1 tablet (100 mg total) by mouth daily.   spironolactone (ALDACTONE) 50 MG tablet Take 1 tablet (50 mg total) by mouth 2 (two) times daily.   valsartan (DIOVAN) 320 MG tablet Take 1 tablet (320 mg total) by mouth daily.   [DISCONTINUED] amLODipine (NORVASC) 10 MG tablet TAKE 1 TABLET (10 MG TOTAL) BY MOUTH DAILY.   [DISCONTINUED] carvedilol (COREG) 25 MG tablet TAKE 1 TABLET (25 MG TOTAL) BY MOUTH 2 (TWO) TIMES DAILY.   [DISCONTINUED] metFORMIN (GLUCOPHAGE) 500 MG tablet TAKE 1 TABLET (500 MG TOTAL) BY MOUTH DAILY WITH BREAKFAST.   No facility-administered encounter medications on file as of 02/10/2023.    Past Medical History:  Diagnosis Date   Arthritis    Asthma    Back pain    CHF (congestive heart failure) (HCC) 2008   EF 40-50%  Dyslipidemia    Fatty liver    Hypertension    Leg edema    Obesity, morbid (HCC)    Obstructive sleep apnea    Prediabetes     Past Surgical History:  Procedure Laterality Date   CARDIAC CATHETERIZATION  Oct 2013   cardiac cath negative for obstructive disease -- did show end diastolic pressure secondary to systemic hypertension   LAPAROSCOPIC GASTRIC SLEEVE RESECTION N/A 01/27/2015   Procedure: LAPAROSCOPIC GASTRIC SLEEVE RESECTION WITH UPPER ENDOSCOPY;  Surgeon: Luretha Murphy, MD;  Location: [facility];  Service: General;  Laterality: N/A;   LEFT HEART CATHETERIZATION WITH CORONARY ANGIOGRAM N/A 08/03/2012   Procedure: LEFT HEART CATHETERIZATION WITH CORONARY ANGIOGRAM;  Surgeon: Tonny Bollman, MD;   Location: [facility] CATH LAB;  Service: Cardiovascular;  Laterality: N/A;   NO PAST SURGERIES      Family History  Problem Relation Age of Onset   Hypertension Mother    Obesity Mother    Hypertension Father     Social History   Socioeconomic History   Marital status: Married    Spouse name: Not on file   Number of children: Not on file   Years of education: Not on file   Highest education level: Not on file  Occupational History   Occupation: Art gallery manager  Tobacco Use   Smoking status: Former    Types: Cigarettes    Quit date: 10/04/2006    Years since quitting: 16.3   Smokeless tobacco: Never  Vaping Use   Vaping Use: Never used  Substance and Sexual Activity   Alcohol use: No   Drug use: No   Sexual activity: Not on file  Other Topics Concern   Not on file  Social History Narrative   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    Social Determinants of Health   Financial Resource Strain: Not on file  Food Insecurity: Not on file  Transportation Needs: Not on file  Physical Activity: Not on file  Stress: Not on file  Social Connections: Not on file  Intimate Partner Violence: Not on file    Review of Systems  All other systems reviewed and are negative.       Objective    BP (!) 138/95   Pulse 75   Temp 98.1 F (36.7 C) (Oral)   Resp 16   Wt (!) 429 lb 9.6 oz (194.9 kg)   SpO2 98%   BMI 58.26 kg/m   Physical Exam Vitals and nursing note reviewed.  Constitutional:      General: He is not in acute distress.    Appearance: He is obese.  Cardiovascular:     Rate and Rhythm: Normal rate and regular rhythm.  Pulmonary:     Effort: Pulmonary effort is normal. No respiratory distress.     Breath sounds: Normal breath sounds. No wheezing.  Abdominal:     Palpations: Abdomen is soft.     Tenderness: There is no abdominal tenderness.  Neurological:     General: No focal deficit present.     Mental Status: He is alert and oriented to person, place,  and time.  Psychiatric:        Mood and Affect: Affect normal. Mood is anxious.        Speech: Speech normal.        Behavior: Behavior normal. Behavior is cooperative.         Assessment & Plan:   1. Gout, unspecified cause, unspecified chronicity, unspecified site Recurrent episodes. Will  prescribe allopurinol as patient has elevated uric adic.   2. HYPERTENSION, BENIGN ESSENTIAL Discussed compliance. Meds refilled.  - amLODipine (NORVASC) 10 MG tablet; TAKE 1 TABLET (10 MG TOTAL) BY MOUTH DAILY.  Dispense: 90 tablet; Refill: 0 - carvedilol (COREG) 25 MG tablet; TAKE 1 TABLET (25 MG TOTAL) BY MOUTH 2 (TWO) TIMES DAILY.  Dispense: 180 tablet; Refill: 0  3. Prediabetes Continue. Metformin refilled.  - metFORMIN (GLUCOPHAGE) 500 MG tablet; TAKE 1 TABLET (500 MG TOTAL) BY MOUTH DAILY WITH BREAKFAST.  Dispense: 90 tablet; Refill: 0  4. Encounter for weight management Discussed dietary and activity options. Phentermine prescribed with goal of 5+lbs/mo wt loss..   5. Class 3 severe obesity due to excess calories with serious comorbidity and body mass index (BMI) of 50.0 to 59.9 in adult [facility]) As above  Return in about 4 weeks (around 03/10/2023) for follow up.   Tommie Raymond, MD

## 2023-03-03 ENCOUNTER — Ambulatory Visit (INDEPENDENT_AMBULATORY_CARE_PROVIDER_SITE_OTHER): Payer: BC Managed Care – PPO | Admitting: Family Medicine

## 2023-03-03 ENCOUNTER — Encounter: Payer: Self-pay | Admitting: Family Medicine

## 2023-03-03 VITALS — BP 154/101 | HR 86 | Temp 98.1°F | Resp 16 | Wt >= 6400 oz

## 2023-03-03 DIAGNOSIS — R059 Cough, unspecified: Secondary | ICD-10-CM | POA: Diagnosis not present

## 2023-03-03 DIAGNOSIS — M25522 Pain in left elbow: Secondary | ICD-10-CM

## 2023-03-03 MED ORDER — ALBUTEROL SULFATE HFA 108 (90 BASE) MCG/ACT IN AERS: 2.0000 | INHALATION_SPRAY | Freq: Four times a day (QID) | RESPIRATORY_TRACT | 0 refills | Status: AC | PRN

## 2023-03-03 NOTE — Progress Notes (Signed)
Patient is here for possibley having elbow tenditis. Pain is 10/10 today

## 2023-03-04 ENCOUNTER — Encounter: Payer: Self-pay | Admitting: Family Medicine

## 2023-03-04 NOTE — Progress Notes (Signed)
Established Patient Office Visit  Subjective    Patient ID: Leonard Lewis, male    DOB: [birth year]  Age: 42 y.o. MRN: [id number]  CC: No chief complaint on file.   HPI Leonard Lewis presents with complaint os cough that is primarily at night time. Denies fever/chills or viral sx. Patient also reports some left elbow pain for several days.    Outpatient Encounter Medications as of 03/03/2023  Medication Sig   albuterol (VENTOLIN HFA) 108 (90 Base) MCG/ACT inhaler Inhale 2 puffs into the lungs every 6 (six) hours as needed for wheezing or shortness of breath.   allopurinol (ZYLOPRIM) 100 MG tablet Take 1 tablet (100 mg total) by mouth daily.   amLODipine (NORVASC) 10 MG tablet TAKE 1 TABLET (10 MG TOTAL) BY MOUTH DAILY.   amoxicillin (AMOXIL) 500 MG capsule Take 1 capsule (500 mg total) by mouth 3 (three) times daily.   atorvastatin (LIPITOR) 40 MG tablet Take 1 tablet (40 mg total) by mouth daily.   carvedilol (COREG) 25 MG tablet TAKE 1 TABLET (25 MG TOTAL) BY MOUTH 2 (TWO) TIMES DAILY.   clonazePAM (KLONOPIN) 0.5 MG tablet Take 1 tablet (0.5 mg total) by mouth 3 (three) times daily as needed for anxiety.   colchicine 0.6 MG tablet Take 1.2 mg ( 2 tablets)  by mouth  as a single dose at the first sign of gout flare, followed by 0.6 mg ( 1 tablet)  BY MOUTH 1 hour later, then 0.6 mg ( 1 tablet) daily for 6 days   cyclobenzaprine (FLEXERIL) 10 MG tablet Take 1 tablet (10 mg total) by mouth 2 (two) times daily as needed for muscle spasms. (Patient not taking: Reported on 11/11/2022)   Diclofenac Sodium (PENNSAID) 2 % SOLN Place 1 application onto the skin 2 (two) times daily. (Patient taking differently: Place 1 application  onto the skin 2 (two) times daily as needed (pain).)   FARXIGA 10 MG TABS tablet Take 1 tablet (10 mg total) by mouth daily. (Patient not taking: Reported on 11/11/2022)   fluticasone (FLONASE) 50 MCG/ACT nasal spray 1 spray Once Daily for 14 days.   hydrALAZINE  (APRESOLINE) 25 MG tablet Take 1 tablet (25 mg total) by mouth 3 (three) times daily.   HYDROcodone-acetaminophen (NORCO/VICODIN) 5-325 MG tablet Take 1 tablet by mouth every 4 (four) hours as needed for moderate pain.   metFORMIN (GLUCOPHAGE) 500 MG tablet TAKE 1 TABLET (500 MG TOTAL) BY MOUTH DAILY WITH BREAKFAST.   phentermine (ADIPEX-P) 37.5 MG tablet Take 1 tablet (37.5 mg total) by mouth daily before breakfast.   predniSONE (DELTASONE) 20 MG tablet Take 2 tablets (40 mg total) by mouth daily.   Respiratory Therapy Supplies (CARETOUCH CPAP MASK WIPES) MISC For cpap machine (Patient not taking: Reported on 11/11/2022)   sertraline (ZOLOFT) 100 MG tablet Take 1 tablet (100 mg total) by mouth daily.   spironolactone (ALDACTONE) 50 MG tablet Take 1 tablet (50 mg total) by mouth 2 (two) times daily.   valsartan (DIOVAN) 320 MG tablet Take 1 tablet (320 mg total) by mouth daily.   No facility-administered encounter medications on file as of 03/03/2023.    Past Medical History:  Diagnosis Date   Arthritis    Asthma    Back pain    CHF (congestive heart failure) (HCC) 2008   EF 40-50%         Dyslipidemia    Fatty liver    Hypertension    Leg edema  Obesity, morbid (HCC)    Obstructive sleep apnea    Prediabetes     Past Surgical History:  Procedure Laterality Date   CARDIAC CATHETERIZATION  Oct 2013   cardiac cath negative for obstructive disease -- did show end diastolic pressure secondary to systemic hypertension   LAPAROSCOPIC GASTRIC SLEEVE RESECTION N/A 01/27/2015   Procedure: LAPAROSCOPIC GASTRIC SLEEVE RESECTION WITH UPPER ENDOSCOPY;  Surgeon: Luretha Murphy, MD;  Location: [facility];  Service: General;  Laterality: N/A;   LEFT HEART CATHETERIZATION WITH CORONARY ANGIOGRAM N/A 08/03/2012   Procedure: LEFT HEART CATHETERIZATION WITH CORONARY ANGIOGRAM;  Surgeon: Tonny Bollman, MD;  Location: [facility] CATH LAB;  Service: Cardiovascular;  Laterality: N/A;   NO PAST SURGERIES       Family History  Problem Relation Age of Onset   Hypertension Mother    Obesity Mother    Hypertension Father     Social History   Socioeconomic History   Marital status: Married    Spouse name: Not on file   Number of children: Not on file   Years of education: Not on file   Highest education level: Not on file  Occupational History   Occupation: Art gallery manager  Tobacco Use   Smoking status: Former    Types: Cigarettes    Quit date: 10/04/2006    Years since quitting: 16.4   Smokeless tobacco: Never  Vaping Use   Vaping Use: Never used  Substance and Sexual Activity   Alcohol use: No   Drug use: No   Sexual activity: Not on file  Other Topics Concern   Not on file  Social History Narrative   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    Social Determinants of Health   Financial Resource Strain: Not on file  Food Insecurity: Not on file  Transportation Needs: Not on file  Physical Activity: Not on file  Stress: Not on file  Social Connections: Not on file  Intimate Partner Violence: Not on file    Review of Systems  All other systems reviewed and are negative.       Objective    BP (!) 154/101   Pulse 86   Temp 98.1 F (36.7 C) (Oral)   Resp 16   Wt (!) 416 lb 3.2 oz (188.8 kg)   SpO2 95%   BMI 56.45 kg/m   Physical Exam Vitals and nursing note reviewed.  Constitutional:      General: He is not in acute distress.    Appearance: He is obese.  Cardiovascular:     Rate and Rhythm: Normal rate and regular rhythm.  Pulmonary:     Effort: Pulmonary effort is normal. No respiratory distress.     Breath sounds: Normal breath sounds. No wheezing.  Abdominal:     Palpations: Abdomen is soft.     Tenderness: There is no abdominal tenderness.  Musculoskeletal:     Left elbow: No deformity. Decreased range of motion. Tenderness present.  Neurological:     General: No focal deficit present.     Mental Status: He is alert and oriented to person,  place, and time.  Psychiatric:        Mood and Affect: Affect normal. Mood is anxious.        Speech: Speech normal.        Behavior: Behavior normal. Behavior is cooperative.         Assessment & Plan:   1. Cough, unspecified type Albuterol MDI prescribed  2. Left elbow pain ?  Gout. Patient reports that he will go to ortho UC if sx persist or worsen.    No follow-ups on file.   Tommie Raymond, MD

## 2023-03-17 ENCOUNTER — Other Ambulatory Visit: Payer: Self-pay

## 2023-03-17 NOTE — Progress Notes (Signed)
   FARHAD BURLESON 07/26/1981 [id number]  Patient outreached by Frederic Jericho , PharmD Candidate on 03/17/2023.  Blood Pressure Readings: Last documented ambulatory systolic blood pressure: 140 Last documented ambulatory diastolic blood pressure: 100 Does the patient have a validated home blood pressure machine?: Yes  Pt states he will sometimes take his BP at home.  Medication review was performed. Is the patient taking their medications as prescribed?: Yes  Pt stated that he would need a clonazepam refill soon.  The following barriers to adherence were noted: Does the patient have cost concerns?: No Does the patient have transportation concerns?: No Does the patient need assistance obtaining refills?: Yes Does the patient occassionally forget to take some of their prescribed medications?: Yes Does the patient feel like one/some of their medications make them feel poorly?: No Does the patient have a follow up scheduled with their primary care provider/cardiologist?: Yes Pt states he misses his medication rarely and if at all, maybe once a week.  Interventions: Interventions Completed: Medications were reviewed, Patient was educated on goal blood pressures and long term health implications of elevated blood pressure, Patient was educated on medications, including indication and administration  The patient has follow up scheduled:  PCP: Georganna Skeans, MD   Frederic Jericho, Student-PharmD

## 2023-03-17 NOTE — Progress Notes (Signed)
Patient attempted to be outreached by Frederic Jericho, PharmD Candidate on 03/17/23 to discuss hypertension. Left voicemail for patient to return our call at their convenience at [phone].  Frederic Jericho, Student-PharmD

## 2023-03-18 ENCOUNTER — Encounter: Payer: Self-pay | Admitting: Family Medicine

## 2023-03-18 ENCOUNTER — Ambulatory Visit (INDEPENDENT_AMBULATORY_CARE_PROVIDER_SITE_OTHER): Payer: BC Managed Care – PPO | Admitting: Family Medicine

## 2023-03-18 VITALS — BP 143/99 | HR 81 | Temp 98.1°F | Resp 16 | Wt >= 6400 oz

## 2023-03-18 DIAGNOSIS — E66813 Obesity, class 3: Secondary | ICD-10-CM

## 2023-03-18 DIAGNOSIS — I10 Essential (primary) hypertension: Secondary | ICD-10-CM

## 2023-03-18 DIAGNOSIS — R059 Cough, unspecified: Secondary | ICD-10-CM | POA: Diagnosis not present

## 2023-03-18 DIAGNOSIS — M109 Gout, unspecified: Secondary | ICD-10-CM

## 2023-03-18 DIAGNOSIS — Z6841 Body Mass Index (BMI) 40.0 and over, adult: Secondary | ICD-10-CM

## 2023-03-18 MED ORDER — SPIRONOLACTONE 100 MG PO TABS: 100.0000 mg | ORAL_TABLET | Freq: Two times a day (BID) | ORAL | 0 refills | Status: DC

## 2023-03-18 MED ORDER — PHENTERMINE HCL 37.5 MG PO TABS: 37.5000 mg | ORAL_TABLET | Freq: Every day | ORAL | 0 refills | Status: DC

## 2023-03-18 NOTE — Progress Notes (Signed)
-  Patient is here for f/up cough and gout. Patient said he is doing better -  Patient would like to talk about weeight lose medication

## 2023-03-21 ENCOUNTER — Encounter: Payer: Self-pay | Admitting: Family Medicine

## 2023-03-21 NOTE — Progress Notes (Signed)
Established Patient Office Visit  Subjective    Patient ID: Leonard Lewis, male    DOB: 08-07-1981  Age: 42 y.o. MRN: [id number]  CC: No chief complaint on file.   HPI Leonard Lewis presents for follow up of chronic med issues. Patient denies acute complaints or concerns.    Outpatient Encounter Medications as of 03/18/2023  Medication Sig   albuterol (VENTOLIN HFA) 108 (90 Base) MCG/ACT inhaler Inhale 2 puffs into the lungs every 6 (six) hours as needed for wheezing or shortness of breath.   allopurinol (ZYLOPRIM) 100 MG tablet Take 1 tablet (100 mg total) by mouth daily.   amLODipine (NORVASC) 10 MG tablet TAKE 1 TABLET (10 MG TOTAL) BY MOUTH DAILY.   amoxicillin (AMOXIL) 500 MG capsule Take 1 capsule (500 mg total) by mouth 3 (three) times daily.   atorvastatin (LIPITOR) 40 MG tablet Take 1 tablet (40 mg total) by mouth daily.   carvedilol (COREG) 25 MG tablet TAKE 1 TABLET (25 MG TOTAL) BY MOUTH 2 (TWO) TIMES DAILY.   clonazePAM (KLONOPIN) 0.5 MG tablet Take 1 tablet (0.5 mg total) by mouth 3 (three) times daily as needed for anxiety.   colchicine 0.6 MG tablet Take 1.2 mg ( 2 tablets)  by mouth  as a single dose at the first sign of gout flare, followed by 0.6 mg ( 1 tablet)  BY MOUTH 1 hour later, then 0.6 mg ( 1 tablet) daily for 6 days   cyclobenzaprine (FLEXERIL) 10 MG tablet Take 1 tablet (10 mg total) by mouth 2 (two) times daily as needed for muscle spasms.   Diclofenac Sodium (PENNSAID) 2 % SOLN Place 1 application onto the skin 2 (two) times daily. (Patient taking differently: Place 1 application  onto the skin 2 (two) times daily as needed (pain).)   FARXIGA 10 MG TABS tablet Take 1 tablet (10 mg total) by mouth daily.   fluticasone (FLONASE) 50 MCG/ACT nasal spray    HYDROcodone-acetaminophen (NORCO/VICODIN) 5-325 MG tablet Take 1 tablet by mouth every 4 (four) hours as needed for moderate pain.   metFORMIN (GLUCOPHAGE) 500 MG tablet TAKE 1 TABLET (500 MG TOTAL) BY  MOUTH DAILY WITH BREAKFAST.   predniSONE (DELTASONE) 20 MG tablet Take 2 tablets (40 mg total) by mouth daily.   Respiratory Therapy Supplies (CARETOUCH CPAP MASK WIPES) MISC For cpap machine   sertraline (ZOLOFT) 100 MG tablet Take 1 tablet (100 mg total) by mouth daily.   spironolactone (ALDACTONE) 100 MG tablet Take 1 tablet (100 mg total) by mouth 2 (two) times daily.   spironolactone (ALDACTONE) 50 MG tablet Take 1 tablet (50 mg total) by mouth 2 (two) times daily.   valsartan (DIOVAN) 320 MG tablet Take 1 tablet (320 mg total) by mouth daily.   [DISCONTINUED] phentermine (ADIPEX-P) 37.5 MG tablet Take 1 tablet (37.5 mg total) by mouth daily before breakfast.   hydrALAZINE (APRESOLINE) 25 MG tablet Take 1 tablet (25 mg total) by mouth 3 (three) times daily.   phentermine (ADIPEX-P) 37.5 MG tablet Take 1 tablet (37.5 mg total) by mouth daily before breakfast.   No facility-administered encounter medications on file as of 03/18/2023.    Past Medical History:  Diagnosis Date   Arthritis    Asthma    Back pain    CHF (congestive heart failure) (HCC) 2008   EF 40-50%         Dyslipidemia    Fatty liver    Hypertension    Leg edema  Obesity, morbid (HCC)    Obstructive sleep apnea    Prediabetes     Past Surgical History:  Procedure Laterality Date   CARDIAC CATHETERIZATION  Oct 2013   cardiac cath negative for obstructive disease -- did show end diastolic pressure secondary to systemic hypertension   LAPAROSCOPIC GASTRIC SLEEVE RESECTION N/A 01/27/2015   Procedure: LAPAROSCOPIC GASTRIC SLEEVE RESECTION WITH UPPER ENDOSCOPY;  Surgeon: Luretha Murphy, MD;  Location: [facility];  Service: General;  Laterality: N/A;   LEFT HEART CATHETERIZATION WITH CORONARY ANGIOGRAM N/A 08/03/2012   Procedure: LEFT HEART CATHETERIZATION WITH CORONARY ANGIOGRAM;  Surgeon: Tonny Bollman, MD;  Location: [facility] CATH LAB;  Service: Cardiovascular;  Laterality: N/A;   NO PAST SURGERIES      Family History   Problem Relation Age of Onset   Hypertension Mother    Obesity Mother    Hypertension Father     Social History   Socioeconomic History   Marital status: Married    Spouse name: Not on file   Number of children: Not on file   Years of education: Not on file   Highest education level: Not on file  Occupational History   Occupation: Art gallery manager  Tobacco Use   Smoking status: Former    Types: Cigarettes    Quit date: 10/04/2006    Years since quitting: 16.4   Smokeless tobacco: Never  Vaping Use   Vaping Use: Never used  Substance and Sexual Activity   Alcohol use: No   Drug use: No   Sexual activity: Not on file  Other Topics Concern   Not on file  Social History Narrative   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    Social Determinants of Health   Financial Resource Strain: Not on file  Food Insecurity: Not on file  Transportation Needs: Not on file  Physical Activity: Not on file  Stress: Not on file  Social Connections: Not on file  Intimate Partner Violence: Not on file    Review of Systems  All other systems reviewed and are negative.       Objective    BP (!) 143/99   Pulse 81   Temp 98.1 F (36.7 C)   Resp 16   Wt (!) 414 lb 6.4 oz (188 kg)   SpO2 96%   BMI 56.20 kg/m   Physical Exam Vitals and nursing note reviewed.  Constitutional:      General: He is not in acute distress.    Appearance: He is obese.  Cardiovascular:     Rate and Rhythm: Normal rate and regular rhythm.  Pulmonary:     Effort: Pulmonary effort is normal. No respiratory distress.     Breath sounds: Normal breath sounds. No wheezing.  Abdominal:     Palpations: Abdomen is soft.     Tenderness: There is no abdominal tenderness.  Neurological:     General: No focal deficit present.     Mental Status: He is alert and oriented to person, place, and time.  Psychiatric:        Mood and Affect: Mood and affect normal.        Speech: Speech normal.        Behavior:  Behavior normal. Behavior is cooperative.         Assessment & Plan:   1. Essential hypertension Slightly elevated readings. Continue and monitor  2. Gout, unspecified cause, unspecified chronicity, unspecified site Stable. Continue. Patient recently treated for tendinitis of left elbow.   3.  Cough, unspecified type Resolved/improved  4. Class 3 severe obesity due to excess calories with serious comorbidity and body mass index (BMI) of 50.0 to 59.9 in adult [facility]) Phentermine refilled. Discussed dietary and activity options. Goal is 4-7lbs/mo wt loss. Discussed compliance.     Return in about 4 weeks (around 04/15/2023) for follow up.   Tommie Raymond, MD

## 2023-04-15 ENCOUNTER — Ambulatory Visit: Payer: BC Managed Care – PPO | Admitting: Family Medicine

## 2023-04-15 DIAGNOSIS — Z3141 Encounter for fertility testing: Secondary | ICD-10-CM | POA: Diagnosis not present

## 2023-04-26 ENCOUNTER — Encounter: Payer: Self-pay | Admitting: Family Medicine

## 2023-04-26 ENCOUNTER — Ambulatory Visit (INDEPENDENT_AMBULATORY_CARE_PROVIDER_SITE_OTHER): Payer: BC Managed Care – PPO | Admitting: Family Medicine

## 2023-04-26 VITALS — BP 128/85 | HR 85 | Temp 98.1°F | Resp 16 | Wt >= 6400 oz

## 2023-04-26 DIAGNOSIS — I10 Essential (primary) hypertension: Secondary | ICD-10-CM

## 2023-04-26 DIAGNOSIS — Z7689 Persons encountering health services in other specified circumstances: Secondary | ICD-10-CM

## 2023-04-26 DIAGNOSIS — E66813 Obesity, class 3: Secondary | ICD-10-CM

## 2023-04-26 DIAGNOSIS — M109 Gout, unspecified: Secondary | ICD-10-CM

## 2023-04-26 DIAGNOSIS — N469 Male infertility, unspecified: Secondary | ICD-10-CM | POA: Diagnosis not present

## 2023-04-26 DIAGNOSIS — Z6841 Body Mass Index (BMI) 40.0 and over, adult: Secondary | ICD-10-CM | POA: Diagnosis not present

## 2023-04-26 MED ORDER — PHENTERMINE HCL 37.5 MG PO TABS
37.5000 mg | ORAL_TABLET | Freq: Every day | ORAL | 0 refills | Status: DC
Start: 2023-04-26 — End: 2023-07-31

## 2023-04-26 MED ORDER — PHENTERMINE HCL 37.5 MG PO TABS: 37.5000 mg | ORAL_TABLET | Freq: Every day | ORAL | 0 refills | Status: DC

## 2023-04-26 NOTE — Progress Notes (Unsigned)
Patient came in for monthly weight check. Patient has no other concerns today

## 2023-04-28 ENCOUNTER — Encounter: Payer: Self-pay | Admitting: Family Medicine

## 2023-04-28 NOTE — Progress Notes (Signed)
Established Patient Office Visit  Subjective    Patient ID: Leonard Lewis, male    DOB: Nov 05, 1980  Age: 42 y.o. MRN: [id number]  CC:  Chief Complaint  Patient presents with   Follow-up   Weight Check    HPI Leonard Lewis presents for follow up of chronic med issues. Patient also wants a referral to urology as his fertility sperm workup was abnormal.    Outpatient Encounter Medications as of 04/26/2023  Medication Sig   albuterol (VENTOLIN HFA) 108 (90 Base) MCG/ACT inhaler Inhale 2 puffs into the lungs every 6 (six) hours as needed for wheezing or shortness of breath.   allopurinol (ZYLOPRIM) 100 MG tablet Take 1 tablet (100 mg total) by mouth daily.   amLODipine (NORVASC) 10 MG tablet TAKE 1 TABLET (10 MG TOTAL) BY MOUTH DAILY.   amoxicillin (AMOXIL) 500 MG capsule Take 1 capsule (500 mg total) by mouth 3 (three) times daily.   atorvastatin (LIPITOR) 40 MG tablet Take 1 tablet (40 mg total) by mouth daily.   carvedilol (COREG) 25 MG tablet TAKE 1 TABLET (25 MG TOTAL) BY MOUTH 2 (TWO) TIMES DAILY.   clonazePAM (KLONOPIN) 0.5 MG tablet Take 1 tablet (0.5 mg total) by mouth 3 (three) times daily as needed for anxiety.   colchicine 0.6 MG tablet Take 1.2 mg ( 2 tablets)  by mouth  as a single dose at the first sign of gout flare, followed by 0.6 mg ( 1 tablet)  BY MOUTH 1 hour later, then 0.6 mg ( 1 tablet) daily for 6 days   cyclobenzaprine (FLEXERIL) 10 MG tablet Take 1 tablet (10 mg total) by mouth 2 (two) times daily as needed for muscle spasms.   Diclofenac Sodium (PENNSAID) 2 % SOLN Place 1 application onto the skin 2 (two) times daily. (Patient taking differently: Place 1 application  onto the skin 2 (two) times daily as needed (pain).)   FARXIGA 10 MG TABS tablet Take 1 tablet (10 mg total) by mouth daily.   fluticasone (FLONASE) 50 MCG/ACT nasal spray    HYDROcodone-acetaminophen (NORCO/VICODIN) 5-325 MG tablet Take 1 tablet by mouth every 4 (four) hours as needed for  moderate pain.   metFORMIN (GLUCOPHAGE) 500 MG tablet TAKE 1 TABLET (500 MG TOTAL) BY MOUTH DAILY WITH BREAKFAST.   phentermine (ADIPEX-P) 37.5 MG tablet Take 1 tablet (37.5 mg total) by mouth daily before breakfast.   predniSONE (DELTASONE) 20 MG tablet Take 2 tablets (40 mg total) by mouth daily.   Respiratory Therapy Supplies (CARETOUCH CPAP MASK WIPES) MISC For cpap machine   sertraline (ZOLOFT) 100 MG tablet Take 1 tablet (100 mg total) by mouth daily.   spironolactone (ALDACTONE) 100 MG tablet Take 1 tablet (100 mg total) by mouth 2 (two) times daily.   spironolactone (ALDACTONE) 50 MG tablet Take 1 tablet (50 mg total) by mouth 2 (two) times daily.   valsartan (DIOVAN) 320 MG tablet Take 1 tablet (320 mg total) by mouth daily.   [DISCONTINUED] phentermine (ADIPEX-P) 37.5 MG tablet Take 1 tablet (37.5 mg total) by mouth daily before breakfast.   hydrALAZINE (APRESOLINE) 25 MG tablet Take 1 tablet (25 mg total) by mouth 3 (three) times daily.   [DISCONTINUED] phentermine (ADIPEX-P) 37.5 MG tablet Take 1 tablet (37.5 mg total) by mouth daily before breakfast.   No facility-administered encounter medications on file as of 04/26/2023.    Past Medical History:  Diagnosis Date   Arthritis    Asthma    Back  pain    CHF (congestive heart failure) (HCC) 2008   EF 40-50%         Dyslipidemia    Fatty liver    Hypertension    Leg edema    Obesity, morbid (HCC)    Obstructive sleep apnea    Prediabetes     Past Surgical History:  Procedure Laterality Date   CARDIAC CATHETERIZATION  Oct 2013   cardiac cath negative for obstructive disease -- did show end diastolic pressure secondary to systemic hypertension   LAPAROSCOPIC GASTRIC SLEEVE RESECTION N/A 01/27/2015   Procedure: LAPAROSCOPIC GASTRIC SLEEVE RESECTION WITH UPPER ENDOSCOPY;  Surgeon: Luretha Murphy, MD;  Location: [facility];  Service: General;  Laterality: N/A;   LEFT HEART CATHETERIZATION WITH CORONARY ANGIOGRAM N/A 08/03/2012    Procedure: LEFT HEART CATHETERIZATION WITH CORONARY ANGIOGRAM;  Surgeon: Tonny Bollman, MD;  Location: [facility] CATH LAB;  Service: Cardiovascular;  Laterality: N/A;   NO PAST SURGERIES      Family History  Problem Relation Age of Onset   Hypertension Mother    Obesity Mother    Hypertension Father     Social History   Socioeconomic History   Marital status: Married    Spouse name: Not on file   Number of children: Not on file   Years of education: Not on file   Highest education level: Not on file  Occupational History   Occupation: Art gallery manager  Tobacco Use   Smoking status: Former    Current packs/day: 0.00    Types: Cigarettes    Quit date: 10/04/2006    Years since quitting: 16.5   Smokeless tobacco: Never  Vaping Use   Vaping status: Never Used  Substance and Sexual Activity   Alcohol use: No   Drug use: No   Sexual activity: Not on file  Other Topics Concern   Not on file  Social History Narrative   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    Social Determinants of Health   Financial Resource Strain: Not on file  Food Insecurity: Not on file  Transportation Needs: Not on file  Physical Activity: Not on file  Stress: Not on file  Social Connections: Not on file  Intimate Partner Violence: Not on file    Review of Systems  All other systems reviewed and are negative.       Objective    BP 128/85   Pulse 85   Temp 98.1 F (36.7 C) (Oral)   Resp 16   Wt (!) 420 lb (190.5 kg)   SpO2 92%   BMI 56.96 kg/m   Physical Exam Vitals and nursing note reviewed.  Constitutional:      General: He is not in acute distress.    Appearance: He is obese.  Cardiovascular:     Rate and Rhythm: Normal rate and regular rhythm.  Abdominal:     Palpations: Abdomen is soft.     Tenderness: There is no abdominal tenderness.  Neurological:     General: No focal deficit present.     Mental Status: He is alert and oriented to person, place, and time.   Psychiatric:        Mood and Affect: Mood and affect normal.        Speech: Speech normal.        Behavior: Behavior normal. Behavior is cooperative.         Assessment & Plan:   1. Encounter for weight management Phentermine refilled. Discussed compliance and reviewed dietary  and activity options.   2. Class 3 severe obesity due to excess calories with serious comorbidity and body mass index (BMI) of 50.0 to 59.9 in adult [facility]) As above  3. Essential hypertension Appears stable.   4. Gout, unspecified cause, unspecified chronicity, unspecified site Appears stable. continue  5. Infertility male Referral to urology for further eval/mgt    No follow-ups on file.   Tommie Raymond, MD

## 2023-05-10 ENCOUNTER — Other Ambulatory Visit: Payer: Self-pay | Admitting: Family Medicine

## 2023-05-10 DIAGNOSIS — I10 Essential (primary) hypertension: Secondary | ICD-10-CM

## 2023-05-10 DIAGNOSIS — R7303 Prediabetes: Secondary | ICD-10-CM

## 2023-06-02 ENCOUNTER — Ambulatory Visit: Payer: BC Managed Care – PPO | Admitting: Family Medicine

## 2023-07-11 ENCOUNTER — Telehealth: Payer: Self-pay | Admitting: Family Medicine

## 2023-07-11 NOTE — Telephone Encounter (Signed)
Called pt and left vm to call office back to schedule follow Up appt

## 2023-07-15 ENCOUNTER — Telehealth: Payer: Self-pay | Admitting: Family Medicine

## 2023-07-15 NOTE — Telephone Encounter (Signed)
Called pt and left vm to call office back to schedule follow up appt requested via MyChart

## 2023-07-30 ENCOUNTER — Other Ambulatory Visit: Payer: Self-pay | Admitting: Family Medicine

## 2023-07-31 ENCOUNTER — Other Ambulatory Visit: Payer: Self-pay

## 2023-07-31 ENCOUNTER — Emergency Department (HOSPITAL_COMMUNITY): Payer: 59

## 2023-07-31 ENCOUNTER — Ambulatory Visit
Admission: EM | Admit: 2023-07-31 | Discharge: 2023-07-31 | Disposition: A | Discharge status: Home / Self Care | Payer: 59 | Attending: Physician Assistant | Admitting: Physician Assistant

## 2023-07-31 ENCOUNTER — Inpatient Hospital Stay (HOSPITAL_COMMUNITY)
Admission: EM | Admit: 2023-07-31 | Discharge: 2023-08-04 | DRG: 291 | Disposition: A | Discharge status: Home / Self Care | Payer: 59 | Attending: Internal Medicine | Admitting: Internal Medicine

## 2023-07-31 ENCOUNTER — Encounter (HOSPITAL_COMMUNITY): Payer: Self-pay | Admitting: Pharmacy Technician

## 2023-07-31 DIAGNOSIS — I3139 Other pericardial effusion (noninflammatory): Secondary | ICD-10-CM | POA: Diagnosis present

## 2023-07-31 DIAGNOSIS — I2489 Other forms of acute ischemic heart disease: Secondary | ICD-10-CM | POA: Diagnosis present

## 2023-07-31 DIAGNOSIS — N179 Acute kidney failure, unspecified: Secondary | ICD-10-CM | POA: Diagnosis present

## 2023-07-31 DIAGNOSIS — K760 Fatty (change of) liver, not elsewhere classified: Secondary | ICD-10-CM | POA: Diagnosis present

## 2023-07-31 DIAGNOSIS — Z79899 Other long term (current) drug therapy: Secondary | ICD-10-CM | POA: Diagnosis not present

## 2023-07-31 DIAGNOSIS — G4733 Obstructive sleep apnea (adult) (pediatric): Secondary | ICD-10-CM | POA: Diagnosis present

## 2023-07-31 DIAGNOSIS — D509 Iron deficiency anemia, unspecified: Secondary | ICD-10-CM | POA: Diagnosis present

## 2023-07-31 DIAGNOSIS — Z23 Encounter for immunization: Secondary | ICD-10-CM

## 2023-07-31 DIAGNOSIS — E876 Hypokalemia: Secondary | ICD-10-CM | POA: Diagnosis present

## 2023-07-31 DIAGNOSIS — Z1152 Encounter for screening for COVID-19: Secondary | ICD-10-CM

## 2023-07-31 DIAGNOSIS — Z91148 Patient's other noncompliance with medication regimen for other reason: Secondary | ICD-10-CM

## 2023-07-31 DIAGNOSIS — J45909 Unspecified asthma, uncomplicated: Secondary | ICD-10-CM | POA: Diagnosis present

## 2023-07-31 DIAGNOSIS — I428 Other cardiomyopathies: Secondary | ICD-10-CM | POA: Diagnosis present

## 2023-07-31 DIAGNOSIS — I509 Heart failure, unspecified: Secondary | ICD-10-CM

## 2023-07-31 DIAGNOSIS — M109 Gout, unspecified: Secondary | ICD-10-CM | POA: Diagnosis present

## 2023-07-31 DIAGNOSIS — Z87891 Personal history of nicotine dependence: Secondary | ICD-10-CM | POA: Diagnosis not present

## 2023-07-31 DIAGNOSIS — R0602 Shortness of breath: Secondary | ICD-10-CM

## 2023-07-31 DIAGNOSIS — Z8249 Family history of ischemic heart disease and other diseases of the circulatory system: Secondary | ICD-10-CM

## 2023-07-31 DIAGNOSIS — Z9884 Bariatric surgery status: Secondary | ICD-10-CM | POA: Diagnosis not present

## 2023-07-31 DIAGNOSIS — I5033 Acute on chronic diastolic (congestive) heart failure: Secondary | ICD-10-CM

## 2023-07-31 DIAGNOSIS — M1711 Unilateral primary osteoarthritis, right knee: Secondary | ICD-10-CM | POA: Diagnosis present

## 2023-07-31 DIAGNOSIS — N1831 Chronic kidney disease, stage 3a: Secondary | ICD-10-CM

## 2023-07-31 DIAGNOSIS — I130 Hypertensive heart and chronic kidney disease with heart failure and stage 1 through stage 4 chronic kidney disease, or unspecified chronic kidney disease: Principal | ICD-10-CM | POA: Diagnosis present

## 2023-07-31 DIAGNOSIS — Z7982 Long term (current) use of aspirin: Secondary | ICD-10-CM

## 2023-07-31 DIAGNOSIS — I160 Hypertensive urgency: Principal | ICD-10-CM

## 2023-07-31 DIAGNOSIS — I5043 Acute on chronic combined systolic (congestive) and diastolic (congestive) heart failure: Secondary | ICD-10-CM | POA: Diagnosis present

## 2023-07-31 DIAGNOSIS — E782 Mixed hyperlipidemia: Secondary | ICD-10-CM | POA: Diagnosis not present

## 2023-07-31 DIAGNOSIS — Z5986 Financial insecurity: Secondary | ICD-10-CM

## 2023-07-31 DIAGNOSIS — E785 Hyperlipidemia, unspecified: Secondary | ICD-10-CM | POA: Diagnosis present

## 2023-07-31 DIAGNOSIS — I5023 Acute on chronic systolic (congestive) heart failure: Secondary | ICD-10-CM | POA: Diagnosis present

## 2023-07-31 DIAGNOSIS — Z8616 Personal history of COVID-19: Secondary | ICD-10-CM | POA: Diagnosis not present

## 2023-07-31 DIAGNOSIS — Z7984 Long term (current) use of oral hypoglycemic drugs: Secondary | ICD-10-CM

## 2023-07-31 DIAGNOSIS — Z6841 Body Mass Index (BMI) 40.0 and over, adult: Secondary | ICD-10-CM

## 2023-07-31 DIAGNOSIS — F32A Depression, unspecified: Secondary | ICD-10-CM | POA: Diagnosis present

## 2023-07-31 DIAGNOSIS — F419 Anxiety disorder, unspecified: Secondary | ICD-10-CM | POA: Diagnosis present

## 2023-07-31 DIAGNOSIS — R7303 Prediabetes: Secondary | ICD-10-CM

## 2023-07-31 DIAGNOSIS — E66813 Obesity, class 3: Secondary | ICD-10-CM | POA: Diagnosis present

## 2023-07-31 DIAGNOSIS — Z7952 Long term (current) use of systemic steroids: Secondary | ICD-10-CM

## 2023-07-31 DIAGNOSIS — I10 Essential (primary) hypertension: Secondary | ICD-10-CM

## 2023-07-31 LAB — BASIC METABOLIC PANEL
Anion gap: 10 (ref 5–15)
BUN: 24 mg/dL — ABNORMAL HIGH (ref 6–20)
CO2: 24 mmol/L (ref 22–32)
Calcium: 8.9 mg/dL (ref 8.9–10.3)
Chloride: 107 mmol/L (ref 98–111)
Creatinine, Ser: 1.67 mg/dL — ABNORMAL HIGH (ref 0.61–1.24)
GFR, Estimated: 52 mL/min — ABNORMAL LOW (ref >60)
Glucose, Bld: 95 mg/dL (ref 70–99)
Potassium: 3.8 mmol/L (ref 3.5–5.1)
Sodium: 141 mmol/L (ref 135–145)

## 2023-07-31 LAB — RESP PANEL BY RT-PCR (RSV, FLU A&B, COVID)  RVPGX2
Influenza A by PCR: NEGATIVE
Influenza B by PCR: NEGATIVE
Resp Syncytial Virus by PCR: NEGATIVE
SARS Coronavirus 2 by RT PCR: NEGATIVE

## 2023-07-31 LAB — CBC
HCT: 37.9 % — ABNORMAL LOW (ref 39.0–52.0)
Hemoglobin: 11.6 g/dL — ABNORMAL LOW (ref 13.0–17.0)
MCH: 21.1 pg — ABNORMAL LOW (ref 26.0–34.0)
MCHC: 30.6 g/dL (ref 30.0–36.0)
MCV: 69 fL — ABNORMAL LOW (ref 80.0–100.0)
Platelets: 312 10*3/uL (ref 150–400)
RBC: 5.49 MIL/uL (ref 4.22–5.81)
RDW: 15.2 % (ref 11.5–15.5)
WBC: 10.5 10*3/uL (ref 4.0–10.5)
nRBC: 0 % (ref 0.0–0.2)

## 2023-07-31 LAB — BRAIN NATRIURETIC PEPTIDE: B Natriuretic Peptide: 342.5 pg/mL — ABNORMAL HIGH (ref 0.0–100.0)

## 2023-07-31 LAB — TROPONIN I (HIGH SENSITIVITY)
Troponin I (High Sensitivity): 54 ng/L — ABNORMAL HIGH (ref <18)
Troponin I (High Sensitivity): 62 ng/L — ABNORMAL HIGH (ref <18)

## 2023-07-31 MED ORDER — INFLUENZA VIRUS VACC SPLIT PF (FLUZONE) 0.5 ML IM SUSY
0.5000 mL | PREFILLED_SYRINGE | INTRAMUSCULAR | Status: AC
  Administered 2023-08-01: 0.5 mL via INTRAMUSCULAR
  Filled 2023-07-31: qty 0.5

## 2023-07-31 MED ORDER — PNEUMOCOCCAL 20-VAL CONJ VACC 0.5 ML IM SUSY
0.5000 mL | PREFILLED_SYRINGE | INTRAMUSCULAR | Status: DC
  Filled 2023-07-31: qty 0.5

## 2023-07-31 MED ORDER — SODIUM CHLORIDE 0.9% FLUSH
3.0000 mL | Freq: Two times a day (BID) | INTRAVENOUS | Status: DC
Start: 2023-07-31 — End: 2023-08-04
  Administered 2023-07-31 – 2023-08-04 (×8): 3 mL via INTRAVENOUS

## 2023-07-31 MED ORDER — AMLODIPINE BESYLATE 10 MG PO TABS
10.0000 mg | ORAL_TABLET | Freq: Every day | ORAL | Status: DC
  Administered 2023-08-01 – 2023-08-04 (×4): 10 mg via ORAL
  Filled 2023-07-31 (×4): qty 1

## 2023-07-31 MED ORDER — FUROSEMIDE 10 MG/ML IJ SOLN
40.0000 mg | Freq: Two times a day (BID) | INTRAMUSCULAR | Status: DC
  Administered 2023-07-31 – 2023-08-01 (×2): 40 mg via INTRAVENOUS
  Filled 2023-07-31 (×2): qty 4

## 2023-07-31 MED ORDER — SENNOSIDES-DOCUSATE SODIUM 8.6-50 MG PO TABS: 1.0000 | ORAL_TABLET | Freq: Every evening | ORAL | Status: DC | PRN

## 2023-07-31 MED ORDER — SERTRALINE HCL 100 MG PO TABS
100.0000 mg | ORAL_TABLET | Freq: Every day | ORAL | Status: DC
  Administered 2023-08-01 – 2023-08-04 (×4): 100 mg via ORAL
  Filled 2023-07-31 (×4): qty 1

## 2023-07-31 MED ORDER — IPRATROPIUM-ALBUTEROL 0.5-2.5 (3) MG/3ML IN SOLN
3.0000 mL | Freq: Once | RESPIRATORY_TRACT | Status: AC
  Administered 2023-07-31: 3 mL via RESPIRATORY_TRACT
  Filled 2023-07-31: qty 3

## 2023-07-31 MED ORDER — ONDANSETRON HCL 4 MG PO TABS: 4.0000 mg | ORAL_TABLET | Freq: Four times a day (QID) | ORAL | Status: DC | PRN

## 2023-07-31 MED ORDER — CARVEDILOL 25 MG PO TABS
25.0000 mg | ORAL_TABLET | Freq: Two times a day (BID) | ORAL | Status: DC
  Administered 2023-07-31 – 2023-08-04 (×8): 25 mg via ORAL
  Filled 2023-07-31 (×8): qty 1

## 2023-07-31 MED ORDER — HYDRALAZINE HCL 50 MG PO TABS
50.0000 mg | ORAL_TABLET | Freq: Three times a day (TID) | ORAL | Status: DC
  Administered 2023-08-01 – 2023-08-03 (×7): 50 mg via ORAL
  Filled 2023-07-31 (×7): qty 1

## 2023-07-31 MED ORDER — FUROSEMIDE 10 MG/ML IJ SOLN: 40.0000 mg | Freq: Once | INTRAMUSCULAR | Status: DC

## 2023-07-31 MED ORDER — ONDANSETRON HCL 4 MG/2ML IJ SOLN: 4.0000 mg | Freq: Four times a day (QID) | INTRAMUSCULAR | Status: DC | PRN

## 2023-07-31 MED ORDER — ATORVASTATIN CALCIUM 40 MG PO TABS
40.0000 mg | ORAL_TABLET | Freq: Every day | ORAL | Status: DC
  Administered 2023-08-01 – 2023-08-04 (×4): 40 mg via ORAL
  Filled 2023-07-31 (×4): qty 1

## 2023-07-31 MED ORDER — ALBUTEROL SULFATE HFA 108 (90 BASE) MCG/ACT IN AERS
2.0000 | INHALATION_SPRAY | RESPIRATORY_TRACT | Status: DC | PRN
Start: 2023-07-31 — End: 2023-08-04
  Administered 2023-07-31: 2 via RESPIRATORY_TRACT
  Filled 2023-07-31: qty 6.7

## 2023-07-31 MED ORDER — SPIRONOLACTONE 25 MG PO TABS
25.0000 mg | ORAL_TABLET | Freq: Every day | ORAL | Status: DC
  Administered 2023-08-01 – 2023-08-04 (×4): 25 mg via ORAL
  Filled 2023-07-31 (×4): qty 1

## 2023-07-31 MED ORDER — HYDRALAZINE HCL 25 MG PO TABS
25.0000 mg | ORAL_TABLET | Freq: Once | ORAL | Status: AC
  Administered 2023-07-31: 25 mg via ORAL
  Filled 2023-07-31: qty 1

## 2023-07-31 MED ORDER — FUROSEMIDE 20 MG PO TABS
40.0000 mg | ORAL_TABLET | Freq: Once | ORAL | Status: AC
  Administered 2023-07-31: 40 mg via ORAL
  Filled 2023-07-31: qty 2

## 2023-07-31 MED ORDER — HYDRALAZINE HCL 20 MG/ML IJ SOLN: 10.0000 mg | INTRAMUSCULAR | Status: DC | PRN

## 2023-07-31 MED ORDER — LABETALOL HCL 5 MG/ML IV SOLN
10.0000 mg | Freq: Once | INTRAVENOUS | Status: AC
  Administered 2023-07-31: 10 mg via INTRAVENOUS
  Filled 2023-07-31: qty 4

## 2023-07-31 MED ORDER — ENOXAPARIN SODIUM 100 MG/ML IJ SOSY
90.0000 mg | PREFILLED_SYRINGE | INTRAMUSCULAR | Status: DC
Start: 2023-08-01 — End: 2023-08-04
  Administered 2023-08-01 – 2023-08-04 (×4): 90 mg via SUBCUTANEOUS
  Filled 2023-07-31 (×4): qty 1

## 2023-07-31 MED ORDER — ORAL CARE MOUTH RINSE: 15.0000 mL | OROMUCOSAL | Status: DC | PRN

## 2023-07-31 MED ORDER — ACETAMINOPHEN 325 MG PO TABS
650.0000 mg | ORAL_TABLET | Freq: Four times a day (QID) | ORAL | Status: DC | PRN
  Administered 2023-08-01 – 2023-08-03 (×2): 650 mg via ORAL
  Filled 2023-07-31 (×2): qty 2

## 2023-07-31 MED ORDER — ACETAMINOPHEN 650 MG RE SUPP: 650.0000 mg | Freq: Four times a day (QID) | RECTAL | Status: DC | PRN

## 2023-07-31 NOTE — ED Triage Notes (Signed)
Patient presents to UC for SOB, rapid breathing since Friday. Productive cough since Saturday. Increased SOB with activity. Hx of asthma and HF. Has not weighed himself x 2 months ago. Treating symptoms with inhaler with no relief.   Denies fever or chest pain.

## 2023-07-31 NOTE — ED Notes (Signed)
ED TO INPATIENT HANDOFF REPORT  ED Nurse Name and Phone #: chris c [phone]  S Name/Age/Gender Leonard Lewis 42 y.o. male Room/Bed: 004C/004C  Code Status   Code Status: Prior  Home/SNF/Other Home Patient oriented to: self, place, time, and situation Is this baseline? Yes   Triage Complete: Triage complete  Chief Complaint Acute on chronic heart failure with preserved ejection fraction (HFpEF) (HCC) [I50.33]  Triage Note Pt here with reports of shob since Friday, worse with exertion. Pt also with productive cough. Hx CHF and asthma. Using inhaler without relief. Seen at [facility] and told had a prolonged QT interval on his EKG. Pt hypertensive in triage 187/123.   Allergies No Known Allergies  Level of Care/Admitting Diagnosis ED Disposition     ED Disposition  Admit   Condition  --   Comment  Hospital Area: [facility] [[id number]]  Level of Care: Progressive [102]  Admit to Progressive based on following criteria: CARDIOVASCULAR & THORACIC of moderate stability with acute coronary syndrome symptoms/low risk myocardial infarction/hypertensive urgency/arrhythmias/heart failure potentially compromising stability and stable post cardiovascular intervention patients.  May admit patient to [facility] or [facility] if equivalent level of care is available:: No  Covid Evaluation: Confirmed COVID Negative  Diagnosis: Acute on chronic heart failure with preserved ejection fraction (HFpEF) [facility]) [[id number]]  Admitting Physician: Charlsie Quest [[id number]]  Attending Physician: Charlsie Quest [[id number]]  Certification:: I certify this patient will need inpatient services for at least 2 midnights  Expected Medical Readiness: 08/03/2023          B Medical/Surgery History Past Medical History:  Diagnosis Date   Arthritis    Asthma    Back pain    CHF (congestive heart failure) (HCC) 2008   EF 40-50%         Dyslipidemia    Fatty liver    Hypertension    Leg edema     Obesity, morbid (HCC)    Obstructive sleep apnea    Prediabetes    Past Surgical History:  Procedure Laterality Date   CARDIAC CATHETERIZATION  Oct 2013   cardiac cath negative for obstructive disease -- did show end diastolic pressure secondary to systemic hypertension   LAPAROSCOPIC GASTRIC SLEEVE RESECTION N/A 01/27/2015   Procedure: LAPAROSCOPIC GASTRIC SLEEVE RESECTION WITH UPPER ENDOSCOPY;  Surgeon: Luretha Murphy, MD;  Location: [facility];  Service: General;  Laterality: N/A;   LEFT HEART CATHETERIZATION WITH CORONARY ANGIOGRAM N/A 08/03/2012   Procedure: LEFT HEART CATHETERIZATION WITH CORONARY ANGIOGRAM;  Surgeon: Tonny Bollman, MD;  Location: [facility] CATH LAB;  Service: Cardiovascular;  Laterality: N/A;   NO PAST SURGERIES       A IV Location/Drains/Wounds Patient Lines/Drains/Airways Status     Active Line/Drains/Airways     Name Placement date Placement time Site Days   Peripheral IV 07/31/23 20 G Anterior;Proximal;Right Forearm 07/31/23  2015  Forearm  less than 1            Intake/Output Last 24 hours No intake or output data in the 24 hours ending 07/31/23 2127  Labs/Imaging Results for orders placed or performed during the hospital encounter of 07/31/23 (from the past 48 hour(s))  Basic metabolic panel     Status: Abnormal   Collection Time: 07/31/23  4:23 PM  Result Value Ref Range   Sodium 141 135 - 145 mmol/L   Potassium 3.8 3.5 - 5.1 mmol/L   Chloride 107 98 - 111 mmol/L   CO2 24 22 -  32 mmol/L   Glucose, Bld 95 70 - 99 mg/dL    Comment: Glucose reference range applies only to samples taken after fasting for at least 8 hours.   BUN 24 (H) 6 - 20 mg/dL   Creatinine, Ser 1.61 (H) 0.61 - 1.24 mg/dL   Calcium 8.9 8.9 - 09.6 mg/dL   GFR, Estimated 52 (L) >60 mL/min    Comment: (NOTE) Calculated using the CKD-EPI Creatinine Equation (2021)    Anion gap 10 5 - 15    Comment: Performed at [facility], [address], [place], [place] [address]  CBC      Status: Abnormal   Collection Time: 07/31/23  4:23 PM  Result Value Ref Range   WBC 10.5 4.0 - 10.5 K/uL   RBC 5.49 4.22 - 5.81 MIL/uL   Hemoglobin 11.6 (L) 13.0 - 17.0 g/dL   HCT 98.1 (L) 19.1 - 47.8 %   MCV 69.0 (L) 80.0 - 100.0 fL   MCH 21.1 (L) 26.0 - 34.0 pg   MCHC 30.6 30.0 - 36.0 g/dL   RDW 29.5 62.1 - 30.8 %   Platelets 312 150 - 400 K/uL    Comment: REPEATED TO VERIFY   nRBC 0.0 0.0 - 0.2 %    Comment: Performed at [facility], [address], [place], [place] [address]  Troponin I (High Sensitivity)     Status: Abnormal   Collection Time: 07/31/23  4:23 PM  Result Value Ref Range   Troponin I (High Sensitivity) 54 (H) <18 ng/L    Comment: (NOTE) Elevated high sensitivity troponin I (hsTnI) values and significant  changes across serial measurements may suggest ACS but many other  chronic and acute conditions are known to elevate hsTnI results.  Refer to the "Links" section for chest pain algorithms and additional  guidance. Performed at [facility], [address], [place], [place] [address]   Resp panel by RT-PCR (RSV, Flu A&B, Covid) Anterior Nasal Swab     Status: None   Collection Time: 07/31/23  4:50 PM   Specimen: Anterior Nasal Swab  Result Value Ref Range   SARS Coronavirus 2 by RT PCR NEGATIVE NEGATIVE   Influenza A by PCR NEGATIVE NEGATIVE   Influenza B by PCR NEGATIVE NEGATIVE    Comment: (NOTE) The Xpert Xpress SARS-CoV-2/FLU/RSV plus assay is intended as an aid in the diagnosis of influenza from Nasopharyngeal swab specimens and should not be used as a sole basis for treatment. Nasal washings and aspirates are unacceptable for Xpert Xpress SARS-CoV-2/FLU/RSV testing.  Fact Sheet for Patients: BloggerCourse.com  Fact Sheet for Healthcare Providers: SeriousBroker.it  This test is not yet approved or cleared by the [place] FDA and has been authorized for detection and/or  diagnosis of SARS-CoV-2 by FDA under an Emergency Use Authorization (EUA). This EUA will remain in effect (meaning this test can be used) for the duration of the COVID-19 declaration under Section 564(b)(1) of the Act, 21 U.S.C. section 360bbb-3(b)(1), unless the authorization is terminated or revoked.     Resp Syncytial Virus by PCR NEGATIVE NEGATIVE    Comment: (NOTE) Fact Sheet for Patients: BloggerCourse.com  Fact Sheet for Healthcare Providers: SeriousBroker.it  This test is not yet approved or cleared by the [place] FDA and has been authorized for detection and/or diagnosis of SARS-CoV-2 by FDA under an Emergency Use Authorization (EUA). This EUA will remain in effect (meaning this test can be used) for the duration of the  COVID-19 declaration under Section 564(b)(1) of the Act, 21 U.S.C. section 360bbb-3(b)(1), unless the authorization is terminated or revoked.  Performed at [facility], [address], [place], [place] [address]   Troponin I (High Sensitivity)     Status: Abnormal   Collection Time: 07/31/23  6:10 PM  Result Value Ref Range   Troponin I (High Sensitivity) 62 (H) <18 ng/L    Comment: (NOTE) Elevated high sensitivity troponin I (hsTnI) values and significant  changes across serial measurements may suggest ACS but many other  chronic and acute conditions are known to elevate hsTnI results.  Refer to the "Links" section for chest pain algorithms and additional  guidance. Performed at [facility], [address] [place], [place], [place] [address]    DG Chest 2 View  Result Date: 07/31/2023 CLINICAL DATA:  Shortness of breath EXAM: CHEST - 2 VIEW COMPARISON:  05/14/2022 FINDINGS: The heart size and mediastinal contours are within normal limits. Cardiomegaly. Mild diffuse interstitial pulmonary opacity. Disc degenerative disease of the thoracic spine. IMPRESSION: Cardiomegaly with mild diffuse  interstitial pulmonary opacity, most consistent with edema. No focal airspace opacity. Electronically Signed   By: Jearld Lesch M.D.   On: 07/31/2023 17:05    Pending Labs Unresulted Labs (From admission, onward)     Start     Ordered   07/31/23 2110  Brain natriuretic peptide  Add-on,   AD        07/31/23 2109            Vitals/Pain Today's Vitals   07/31/23 2002 07/31/23 2019 07/31/23 2023 07/31/23 2103  BP:  (!) 205/134 (!) 199/148 (!) 191/155  Pulse:  85 84 79  Resp: (!) 117   18  Temp:    98 F (36.7 C)  TempSrc:    Oral  SpO2:  100% 100% 100%  Weight:      Height:      PainSc:        Isolation Precautions No active isolations  Medications Medications  albuterol (VENTOLIN HFA) 108 (90 Base) MCG/ACT inhaler 2 puff (2 puffs Inhalation Given 07/31/23 1704)  labetalol (NORMODYNE) injection 10 mg (has no administration in time range)  hydrALAZINE (APRESOLINE) tablet 25 mg (has no administration in time range)  ipratropium-albuterol (DUONEB) 0.5-2.5 (3) MG/3ML nebulizer solution 3 mL (3 mLs Nebulization Given 07/31/23 1704)  furosemide (LASIX) tablet 40 mg (40 mg Oral Given 07/31/23 1854)  labetalol (NORMODYNE) injection 10 mg (10 mg Intravenous Given 07/31/23 2023)    Mobility walks     Focused Assessments Cardiac Assessment Handoff:    Lab Results  Component Value Date   CKTOTAL 626 (H) 03/05/2007   CKMB 2.8 03/05/2007   TROPONINI (H) 03/05/2007    0.12        PERSISTENTLY INCREASED TROPONIN VALUES IN THE RANGE OF 0.06-0.49 ng/mL CAN BE SEEN IN:       -UNSTABLE ANGINA   Lab Results  Component Value Date   DDIMER 0.98 (H) 05/14/2022   Does the Patient currently have chest pain? No    R Recommendations: See Admitting Provider Note  Report given to:   Additional Notes:

## 2023-07-31 NOTE — ED Provider Notes (Signed)
[facility]    CSN: [id number] Arrival date & time: 07/31/23  1411      History   Chief Complaint Chief Complaint  Patient presents with   Shortness of Breath    HPI Leonard Lewis is a 42 y.o. male.   Patient presents today for evaluation of shortness of breath that is worsened over the last few days.  He states that he was going to the store earlier today and had worsening shortness of breath with exertion.  He denies any chest pain.  He does have history significant for asthma, CHF, hypertension and sleep apnea.  The history is provided by the patient.  Shortness of Breath Associated symptoms: no chest pain, no cough and no fever     Past Medical History:  Diagnosis Date   Arthritis    Asthma    Back pain    CHF (congestive heart failure) (HCC) 2008   EF 40-50%         Dyslipidemia    Fatty liver    Hypertension    Leg edema    Obesity, morbid (HCC)    Obstructive sleep apnea    Prediabetes     Patient Active Problem List   Diagnosis Date Noted   Pain in joint of left elbow 03/03/2023   CAP (community acquired pneumonia) 05/14/2022   Community acquired pneumonia 05/14/2022   Acute left-sided low back pain with left-sided sciatica 11/06/2020   Gastroesophageal reflux disease without esophagitis 11/06/2020   Diarrhea 11/06/2020   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   Left foot pain 05/23/2020   Tonsillar hypertrophy 01/24/2020   Frequent nosebleeds 01/24/2020   Vitamin D deficiency 06/19/2019   Prediabetes 01/03/2018   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   Chronic systolic heart failure (HCC) 10/30/2013   Chronic combined systolic (congestive) and diastolic (congestive) heart failure (HCC) 10/30/2013   Obstructive sleep apnea 05/31/2007   HYPERTENSION, BENIGN ESSENTIAL 05/02/2007   HLD (hyperlipidemia) 03/17/2007   Morbid obesity (HCC) 03/17/2007    Past Surgical History:  Procedure Laterality Date    CARDIAC CATHETERIZATION  Oct 2013   cardiac cath negative for obstructive disease -- did show end diastolic pressure secondary to systemic hypertension   LAPAROSCOPIC GASTRIC SLEEVE RESECTION N/A 01/27/2015   Procedure: LAPAROSCOPIC GASTRIC SLEEVE RESECTION WITH UPPER ENDOSCOPY;  Surgeon: Luretha Murphy, MD;  Location: [facility];  Service: General;  Laterality: N/A;   LEFT HEART CATHETERIZATION WITH CORONARY ANGIOGRAM N/A 08/03/2012   Procedure: LEFT HEART CATHETERIZATION WITH CORONARY ANGIOGRAM;  Surgeon: Tonny Bollman, MD;  Location: [facility] CATH LAB;  Service: Cardiovascular;  Laterality: N/A;   NO PAST SURGERIES         Home Medications    Prior to Admission medications   Medication Sig Start Date End Date Taking? Authorizing Provider  albuterol (VENTOLIN HFA) 108 (90 Base) MCG/ACT inhaler Inhale 2 puffs into the lungs every 6 (six) hours as needed for wheezing or shortness of breath. 03/03/23   Georganna Skeans, MD  allopurinol (ZYLOPRIM) 100 MG tablet Take 1 tablet (100 mg total) by mouth daily. 02/10/23   Georganna Skeans, MD  amLODipine (NORVASC) 10 MG tablet TAKE 1 TABLET BY MOUTH EVERY DAY 05/10/23   Georganna Skeans, MD  amoxicillin (AMOXIL) 500 MG capsule Take 1 capsule (500 mg total) by mouth 3 (three) times daily. 12/27/22   Elson Areas, PA-C  atorvastatin (LIPITOR) 40 MG tablet Take 1 tablet (40 mg total) by mouth daily. 12/07/21  Georganna Skeans, MD  carvedilol (COREG) 25 MG tablet TAKE 1 TABLET BY MOUTH TWICE A DAY 05/10/23   Georganna Skeans, MD  clonazePAM (KLONOPIN) 0.5 MG tablet Take 1 tablet (0.5 mg total) by mouth 3 (three) times daily as needed for anxiety. 11/11/22   Georganna Skeans, MD  colchicine 0.6 MG tablet Take 1.2 mg ( 2 tablets)  by mouth  as a single dose at the first sign of gout flare, followed by 0.6 mg ( 1 tablet)  BY MOUTH 1 hour later, then 0.6 mg ( 1 tablet) daily for 6 days 02/02/23   Valinda Hoar, NP  cyclobenzaprine (FLEXERIL) 10 MG tablet Take 1 tablet (10 mg total) by  mouth 2 (two) times daily as needed for muscle spasms. 06/09/22   Tomi Bamberger, PA-C  Diclofenac Sodium (PENNSAID) 2 % SOLN Place 1 application onto the skin 2 (two) times daily. Patient taking differently: Place 1 application  onto the skin 2 (two) times daily as needed (pain). 06/20/20   Myra Rude, MD  FARXIGA 10 MG TABS tablet Take 1 tablet (10 mg total) by mouth daily. 04/29/20   Storm Frisk, MD  fluticasone Aleda Grana) 50 MCG/ACT nasal spray  03/05/21   [provider]  hydrALAZINE (APRESOLINE) 25 MG tablet Take 1 tablet (25 mg total) by mouth 3 (three) times daily. 05/16/22 11/11/22  Noralee Stain, DO  HYDROcodone-acetaminophen (NORCO/VICODIN) 5-325 MG tablet Take 1 tablet by mouth every 4 (four) hours as needed for moderate pain. 01/02/23 01/02/24  Elson Areas, PA-C  metFORMIN (GLUCOPHAGE) 500 MG tablet TAKE 1 TABLET BY MOUTH EVERY DAY WITH BREAKFAST 05/10/23   Georganna Skeans, MD  phentermine (ADIPEX-P) 37.5 MG tablet Take 1 tablet (37.5 mg total) by mouth daily before breakfast. 04/26/23   Georganna Skeans, MD  predniSONE (DELTASONE) 20 MG tablet Take 2 tablets (40 mg total) by mouth daily. 02/02/23   Valinda Hoar, NP  Respiratory Therapy Supplies (CARETOUCH CPAP MASK WIPES) MISC For cpap machine 07/29/21   Georganna Skeans, MD  sertraline (ZOLOFT) 100 MG tablet Take 1 tablet (100 mg total) by mouth daily. 11/11/22   Georganna Skeans, MD  spironolactone (ALDACTONE) 100 MG tablet Take 1 tablet (100 mg total) by mouth 2 (two) times daily. 03/18/23   Georganna Skeans, MD  spironolactone (ALDACTONE) 50 MG tablet Take 1 tablet (50 mg total) by mouth 2 (two) times daily. 11/11/22   Georganna Skeans, MD  valsartan (DIOVAN) 320 MG tablet Take 1 tablet (320 mg total) by mouth daily. 05/04/22   Georganna Skeans, MD    Family History Family History  Problem Relation Age of Onset   Hypertension Mother    Obesity Mother    Hypertension Father     Social History Social History   Tobacco Use    Smoking status: Former    Current packs/day: 0.00    Types: Cigarettes    Quit date: 10/04/2006    Years since quitting: 16.8   Smokeless tobacco: Never  Vaping Use   Vaping status: Never Used  Substance Use Topics   Alcohol use: No   Drug use: No     Allergies   Patient has no known allergies.   Review of Systems Review of Systems  Constitutional:  Negative for chills and fever.  Eyes:  Negative for discharge and redness.  Respiratory:  Positive for shortness of breath. Negative for cough.   Cardiovascular:  Negative for chest pain.  Neurological:  Negative for numbness.  Physical Exam Triage Vital Signs ED Triage Vitals  Encounter Vitals Group     BP 07/31/23 1418 (!) 179/111     Systolic BP Percentile --      Diastolic BP Percentile --      Pulse Rate 07/31/23 1418 87     Resp 07/31/23 1418 (!) 40     Temp 07/31/23 1418 97.7 F (36.5 C)     Temp Source 07/31/23 1418 Oral     SpO2 07/31/23 1418 96 %     Weight --      Height --      Head Circumference --      Peak Flow --      Pain Score 07/31/23 1427 0     Pain Loc --      Pain Education --      Exclude from Growth Chart --    No data found.  Updated Vital Signs BP (!) 179/111 (BP Location: Left Arm)   Pulse 87   Temp 97.7 F (36.5 C) (Oral)   Resp (!) 40   SpO2 96%      Physical Exam Vitals and nursing note reviewed.  Constitutional:      General: He is in acute distress.     Appearance: Normal appearance.     Comments: Tachypnea noted  HENT:     Head: Normocephalic and atraumatic.  Eyes:     Conjunctiva/sclera: Conjunctivae normal.  Cardiovascular:     Rate and Rhythm: Normal rate and regular rhythm.  Pulmonary:     Effort: Respiratory distress present.     Breath sounds: Normal breath sounds.  Neurological:     Mental Status: He is alert.  Psychiatric:        Mood and Affect: Mood normal.        Behavior: Behavior normal.        Thought Content: Thought content normal.       UC Treatments / Results  Labs (all labs ordered are listed, but only abnormal results are displayed) Labs Reviewed - No data to display  EKG   Radiology No results found.  Procedures Procedures (including critical care time)  Medications Ordered in UC Medications - No data to display  Initial Impression / Assessment and Plan / UC Course  I have reviewed the triage vital signs and the nursing notes.  Pertinent labs & imaging results that were available during my care of the patient were reviewed by me and considered in my medical decision making (see chart for details).   Given elevated respiratory rate and significant past medical history, recommended further evaluation in the emergency room.   EKG ordered with prolonged QTc compared to prior. Encouraged patient to have transport to ED, he prefers his wife transport via POV versus EMS transport.  Will defer further management treatment to emergency room.   Final Clinical Impressions(s) / UC Diagnoses   Final diagnoses:  Shortness of breath on exertion   Discharge Instructions   None    ED Prescriptions   None    PDMP not reviewed this encounter.   Tomi Bamberger, PA-C 07/31/23 [phone]

## 2023-07-31 NOTE — Hospital Course (Signed)
Leonard Lewis was admitted to the hospital with the working diagnosis of heart failure exacerbation.   41/M with past medical history of obesity class 3, OSA,  CHF, CKD 3A, hypertension,iron deficiency anemia, asthma, gout, anxiety presented to the ED with progressive shortness of breath for few days. He endorsed not being compliant on his medications. On his initial physical examination his blood pressure was 187/123. HR 86, RR 21 and 02 saturation 98% on room air. Lungs with no wheezing or rales, no rhonchi, heart with S1 and S2 present and regular with no gallops, rubs or murmurs, abdomen with no distention, positive lower extremity edema.   Na 141, K 3,8 CL 107 bicarbonate 24, glucose 95 bun 24 cr 1,67  Mg 1,7  AST 19 ALT 13  Wbc 10,5 hgb 11.6 plt 312  Sars covid 19 negative  Chest radiograph with cardiomegaly, bilateral hilar vascular congestion, bilateral central interstitial infiltrates with cephalization of the vasculature.  No pleural effusions.   EKG 83 bpm, left axis deviation, normal intervals, sinus rhythm with bi atrial enlargement, no significant ST segment or T wave changes.   Patient was placed on diuretic therapy with improvement in his symptoms.   Echocardiogram with reduced LV systolic function.   Started on guideline directed medical therapy for heart failure.  10/31 patient will continue medical therapy and have close follow up as outpatient.

## 2023-07-31 NOTE — H&P (Signed)
History and Physical    Leonard Lewis YNW:[id number] DOB: 1980-10-09 DOA: 07/31/2023  PCP: Georganna Skeans, MD  Patient coming from: Home  I have personally briefly reviewed patient's old medical records in [facility]  Chief Complaint: Shortness of breath  HPI: Leonard Lewis is a 42 y.o. male with medical history significant for chronic HFpEF (EF 45-50% by TTE 05/2022), CKD stage IIIa, HTN, HLD, microcytic anemia, asthma, gout, anxiety, OSA who presented to the ED for evaluation of shortness of breath.  Patient states over the last few days he has had progressive shortness of breath.  This is occurring with exertion as well as at rest.  He has had occasional nonproductive cough.  He has not had any chest pain.  He has not appreciated an significant swelling in his extremities.  He is unaware of any change in weight.  He denies nausea, vomiting, abdominal pain, dysuria.  He reports good urine output.  Patient states that he has been running low on a lot of his medications and has been taking them less frequently than prescribed in order to stretch them out until he can get refills.  ED Course  Labs/Imaging on admission: I have personally reviewed following labs and imaging studies.  Initial vitals showed BP 187/123, pulse 86, RR 21, temp 98.2 F, SpO2 98% on room air.  Labs showed WBC 10.5, hemoglobin 11.6, platelets 312,000, sodium 141, potassium 3.8, bicarb 24, BUN 24, creatinine 1.67, serum glucose 95, troponin 54 > 62.  SARS-CoV-2, influenza, RSV PCR negative.  2 view chest x-ray showed cardiomegaly with diffuse interstitial pulmonary opacity most consistent with edema.  No focal airspace opacity.  Patient was given IV labetalol 10 mg x 2, oral hydralazine 25 mg, oral Lasix 40 mg, DuoNeb treatment.  The hospitalist service was consulted to admit for further evaluation and management.  Review of Systems: All systems reviewed and are negative except as documented in history  of present illness above.   Past Medical History:  Diagnosis Date   Arthritis    Asthma    Back pain    CHF (congestive heart failure) (HCC) 2008   EF 40-50%         Dyslipidemia    Fatty liver    Hypertension    Leg edema    Obesity, morbid (HCC)    Obstructive sleep apnea    Prediabetes     Past Surgical History:  Procedure Laterality Date   CARDIAC CATHETERIZATION  Oct 2013   cardiac cath negative for obstructive disease -- did show end diastolic pressure secondary to systemic hypertension   LAPAROSCOPIC GASTRIC SLEEVE RESECTION N/A 01/27/2015   Procedure: LAPAROSCOPIC GASTRIC SLEEVE RESECTION WITH UPPER ENDOSCOPY;  Surgeon: Luretha Murphy, MD;  Location: [facility] OR[facility];  Service: General;  Laterality: N/A;   LEFT HEART CATHETERIZATION WITH CORONARY ANGIOGRAM N/A 08/03/2012   Procedure: LEFT HEART CATHETERIZATION WITH CORONARY ANGIOGRAM;  Surgeon: Tonny Bollman, MD;  Location: [facility] CATH LAB;  Service: Cardiovascular;  Laterality: N/A;   NO PAST SURGERIES      Social History:  reports that he quit smoking about 16 years ago. His smoking use included cigarettes. He has never used smokeless tobacco. He reports that he does not drink alcohol and does not use drugs.  No Known Allergies  Family History  Problem Relation Age of Onset   Hypertension Mother    Obesity Mother    Hypertension Father      Prior to Admission medications   Medication Sig  Start Date End Date Taking? Authorizing Provider  albuterol (VENTOLIN HFA) 108 (90 Base) MCG/ACT inhaler Inhale 2 puffs into the lungs every 6 (six) hours as needed for wheezing or shortness of breath. 03/03/23   Georganna Skeans, MD  allopurinol (ZYLOPRIM) 100 MG tablet Take 1 tablet (100 mg total) by mouth daily. 02/10/23   Georganna Skeans, MD  amLODipine (NORVASC) 10 MG tablet TAKE 1 TABLET BY MOUTH EVERY DAY 05/10/23   Georganna Skeans, MD  amoxicillin (AMOXIL) 500 MG capsule Take 1 capsule (500 mg total) by mouth 3 (three) times daily. 12/27/22    Elson Areas, PA-C  atorvastatin (LIPITOR) 40 MG tablet Take 1 tablet (40 mg total) by mouth daily. 12/07/21   Georganna Skeans, MD  carvedilol (COREG) 25 MG tablet TAKE 1 TABLET BY MOUTH TWICE A DAY 05/10/23   Georganna Skeans, MD  clonazePAM (KLONOPIN) 0.5 MG tablet Take 1 tablet (0.5 mg total) by mouth 3 (three) times daily as needed for anxiety. 11/11/22   Georganna Skeans, MD  colchicine 0.6 MG tablet Take 1.2 mg ( 2 tablets)  by mouth  as a single dose at the first sign of gout flare, followed by 0.6 mg ( 1 tablet)  BY MOUTH 1 hour later, then 0.6 mg ( 1 tablet) daily for 6 days 02/02/23   Valinda Hoar, NP  cyclobenzaprine (FLEXERIL) 10 MG tablet Take 1 tablet (10 mg total) by mouth 2 (two) times daily as needed for muscle spasms. 06/09/22   Tomi Bamberger, PA-C  Diclofenac Sodium (PENNSAID) 2 % SOLN Place 1 application onto the skin 2 (two) times daily. Patient taking differently: Place 1 application  onto the skin 2 (two) times daily as needed (pain). 06/20/20   Myra Rude, MD  FARXIGA 10 MG TABS tablet Take 1 tablet (10 mg total) by mouth daily. 04/29/20   Storm Frisk, MD  fluticasone Aleda Grana) 50 MCG/ACT nasal spray  03/05/21   [provider]  hydrALAZINE (APRESOLINE) 25 MG tablet Take 1 tablet (25 mg total) by mouth 3 (three) times daily. 05/16/22 11/11/22  Noralee Stain, DO  HYDROcodone-acetaminophen (NORCO/VICODIN) 5-325 MG tablet Take 1 tablet by mouth every 4 (four) hours as needed for moderate pain. 01/02/23 01/02/24  Elson Areas, PA-C  metFORMIN (GLUCOPHAGE) 500 MG tablet TAKE 1 TABLET BY MOUTH EVERY DAY WITH BREAKFAST 05/10/23   Georganna Skeans, MD  phentermine (ADIPEX-P) 37.5 MG tablet Take 1 tablet (37.5 mg total) by mouth daily before breakfast. 04/26/23   Georganna Skeans, MD  predniSONE (DELTASONE) 20 MG tablet Take 2 tablets (40 mg total) by mouth daily. 02/02/23   Valinda Hoar, NP  Respiratory Therapy Supplies (CARETOUCH CPAP MASK WIPES) MISC For cpap machine  07/29/21   Georganna Skeans, MD  sertraline (ZOLOFT) 100 MG tablet Take 1 tablet (100 mg total) by mouth daily. 11/11/22   Georganna Skeans, MD  spironolactone (ALDACTONE) 100 MG tablet Take 1 tablet (100 mg total) by mouth 2 (two) times daily. 03/18/23   Georganna Skeans, MD  spironolactone (ALDACTONE) 50 MG tablet Take 1 tablet (50 mg total) by mouth 2 (two) times daily. 11/11/22   Georganna Skeans, MD  valsartan (DIOVAN) 320 MG tablet Take 1 tablet (320 mg total) by mouth daily. 05/04/22   Georganna Skeans, MD    Physical Exam: Vitals:   07/31/23 2019 07/31/23 2023 07/31/23 2103 07/31/23 2120  BP: (!) 205/134 (!) 199/148 (!) 191/155 (!) 167/118  Pulse: 85 84 79 78  Resp:  18 18  Temp:   98 F (36.7 C)   TempSrc:   Oral   SpO2: 100% 100% 100% 100%  Weight:      Height:       Constitutional: Resting in bed, NAD, calm, comfortable Eyes: EOMI, lids and conjunctivae normal ENMT: Mucous membranes are moist. Posterior pharynx clear of any exudate or lesions.Normal dentition.  Neck: normal, supple, no masses. Respiratory: clear to auscultation bilaterally, no wheezing, no crackles.  Slightly increased respiratory effort.  Speaking in full sentences.  No accessory muscle use.  Cardiovascular: Regular rate and rhythm, no murmurs / rubs / gallops.  +1 nonpitting bilateral lower extremity edema. 2+ pedal pulses. Abdomen: no tenderness, no masses palpated.  Musculoskeletal: no clubbing / cyanosis. No joint deformity upper and lower extremities. Good ROM, no contractures. Normal muscle tone.  Skin: no rashes, lesions, ulcers. No induration Neurologic: Sensation intact. Strength 5/5 in all 4.  Psychiatric: Normal judgment and insight. Alert and oriented x 3. Normal mood.   EKG: Personally reviewed. Sinus rhythm, rate 88, RAE, no acute ischemic changes.  Rate is slower when compared to previous.  Assessment/Plan Principal Problem:   Acute on chronic heart failure with preserved ejection fraction (HFpEF)  (HCC) Active Problems:   Hypertensive urgency   Chronic kidney disease, stage 3a (HCC)   HLD (hyperlipidemia)   Obstructive sleep apnea   Asthma   Microcytic anemia   Leonard Lewis is a 42 y.o. male with medical history significant for chronic HFpEF (EF 45-50% by TTE 05/2022), CKD stage IIIa, HTN, HLD, microcytic anemia, asthma, gout, anxiety, OSA is admitted with acute on chronic HFpEF and hypertensive urgency.  Assessment and Plan: Acute on chronic HFpEF: Last EF 45-50% by TTE 05/2022, EF as low as 25-30% in 2015.  NICM by [facility] in 2013.  Presenting with dyspnea, pulmonary edema on CXR.  Currently saturating well on room air.  Mild troponin elevation likely demand ischemia in setting of CHF exacerbation and hypertensive urgency. -Start IV Lasix 40 mg twice daily -Update echocardiogram -Continue Coreg 25 mg BID -Continue spironolactone at 25 mg daily (previous prescription for 50 and 100 mg both BID, although patient has not been taking) -Hold valsartan for now, may be candidate for Entresto  Hypertensive urgency: Contributing to above.  Has not had some of his home meds for a while. -Continue Coreg 25 mg twice daily -Increase hydralazine to 50 mg 3 times daily -Spironolactone 25 mg daily -Continue IV Lasix as above -IV hydralazine as needed  CKD stage IIIa: Renal function stable on admission.  Monitor with diuresis.  Asthma: Stable without obvious wheezing on admission.  Continue albuterol as needed.  Chronic microcytic anemia: Mild anemia with chronic microcytosis.  Check ferritin and iron/TIBC.  Hyperlipidemia: Continue atorvastatin.  Depression/anxiety: Continue sertraline.  OSA: Not using CPAP consistently at home, he is willing to use it here. -Continue CPAP nightly   DVT prophylaxis: Lovenox Code Status: Full code, confirmed with patient on admission Family Communication: Discussed with patient, he has discussed with family Disposition Plan: From home, dispo  pending clinical progress Consults called: None Severity of Illness: The appropriate patient status for this patient is INPATIENT. Inpatient status is judged to be reasonable and necessary in order to provide the required intensity of service to ensure the patient's safety. The patient's presenting symptoms, physical exam findings, and initial radiographic and laboratory data in the context of their chronic comorbidities is felt to place them at high risk for further clinical deterioration. Furthermore,  it is not anticipated that the patient will be medically stable for discharge from the hospital within 2 midnights of admission.   * I certify that at the point of admission it is my clinical judgment that the patient will require inpatient hospital care spanning beyond 2 midnights from the point of admission due to high intensity of service, high risk for further deterioration and high frequency of surveillance required.Darreld Mclean MD [facility]  If 7PM-7AM, please contact night-coverage [email or web address]  07/31/2023, 9:55 PM

## 2023-07-31 NOTE — ED Notes (Signed)
Short of breath with exertion 

## 2023-07-31 NOTE — Plan of Care (Signed)
  Problem: Education: Goal: Ability to demonstrate management of disease process will improve 07/31/2023 2254 by Terrall Laity, RN Outcome: Progressing 07/31/2023 2254 by Terrall Laity, RN Outcome: Progressing Goal: Ability to verbalize understanding of medication therapies will improve Outcome: Progressing

## 2023-07-31 NOTE — ED Triage Notes (Signed)
Pt here with reports of shob since Friday, worse with exertion. Pt also with productive cough. Hx CHF and asthma. Using inhaler without relief. Seen at [facility] and told had a prolonged QT interval on his EKG. Pt hypertensive in triage 187/123.

## 2023-07-31 NOTE — ED Notes (Signed)
Admitting at the bedside.  

## 2023-07-31 NOTE — ED Notes (Signed)
Patient is being discharged from the Urgent Care and sent to the Emergency Department via POV with wife. Per [place], [place], patient is in need of higher level of care due to abnormal EKG. Patient is aware and verbalizes understanding of plan of care.  Vitals:   07/31/23 1418  BP: (!) 179/111  Pulse: 87  Resp: (!) 40  Temp: 97.7 F (36.5 C)  SpO2: 96%

## 2023-07-31 NOTE — ED Notes (Signed)
[facility] contacted they will take the pt now

## 2023-07-31 NOTE — ED Provider Notes (Signed)
[place] EMERGENCY DEPARTMENT AT [facility] Provider Note   CSN: [id number] Arrival date & time: 07/31/23  1531     History  Chief Complaint  Patient presents with   Shortness of Breath   Abnormal ECG    Leonard ENERSON is a 42 y.o. male.  42 year old male with past medical history ofAsthma, gout,DiabetesPresenting to the emergency department today withShortness of breath.  The patient states that this has been worsening over the past few days.  HeDenies any associated chest pain.He denies a history of DVT or pulmonary embolism, recent surgeries, recent travel.  He denies any leg pain or swelling.  Denies any hemoptysis.He came to the emergency department today after going to urgent care and he was sent in for further evaluation.  The patient's EKG has some nonspecific changes but does appear similar in morphology to her previous EKG.  The patient's chest x-ray does show some pulmonary edema.  His blood pressure remains elevated.  On reassessment his diastolic is over 401.  I did call and discussed this with cardiology.  They did recommend trying to get the patient's diastolic blood pressure down and with his troponins trending up mildly they do recommend admission for this.  The patient is also given Lasix prior to talking to cardiology which they agreed with.  A call was placed to the hospitalist service for admission.   Shortness of Breath      Home Medications Prior to Admission medications   Medication Sig Start Date End Date Taking? Authorizing Provider  albuterol (VENTOLIN HFA) 108 (90 Base) MCG/ACT inhaler Inhale 2 puffs into the lungs every 6 (six) hours as needed for wheezing or shortness of breath. 03/03/23   Georganna Skeans, MD  allopurinol (ZYLOPRIM) 100 MG tablet Take 1 tablet (100 mg total) by mouth daily. 02/10/23   Georganna Skeans, MD  amLODipine (NORVASC) 10 MG tablet TAKE 1 TABLET BY MOUTH EVERY DAY 05/10/23   Georganna Skeans, MD  amoxicillin (AMOXIL) 500 MG  capsule Take 1 capsule (500 mg total) by mouth 3 (three) times daily. 12/27/22   Elson Areas, PA-C  atorvastatin (LIPITOR) 40 MG tablet Take 1 tablet (40 mg total) by mouth daily. 12/07/21   Georganna Skeans, MD  carvedilol (COREG) 25 MG tablet TAKE 1 TABLET BY MOUTH TWICE A DAY 05/10/23   Georganna Skeans, MD  clonazePAM (KLONOPIN) 0.5 MG tablet Take 1 tablet (0.5 mg total) by mouth 3 (three) times daily as needed for anxiety. 11/11/22   Georganna Skeans, MD  colchicine 0.6 MG tablet Take 1.2 mg ( 2 tablets)  by mouth  as a single dose at the first sign of gout flare, followed by 0.6 mg ( 1 tablet)  BY MOUTH 1 hour later, then 0.6 mg ( 1 tablet) daily for 6 days 02/02/23   Valinda Hoar, NP  cyclobenzaprine (FLEXERIL) 10 MG tablet Take 1 tablet (10 mg total) by mouth 2 (two) times daily as needed for muscle spasms. 06/09/22   Tomi Bamberger, PA-C  Diclofenac Sodium (PENNSAID) 2 % SOLN Place 1 application onto the skin 2 (two) times daily. Patient taking differently: Place 1 application  onto the skin 2 (two) times daily as needed (pain). 06/20/20   Myra Rude, MD  FARXIGA 10 MG TABS tablet Take 1 tablet (10 mg total) by mouth daily. 04/29/20   Storm Frisk, MD  fluticasone Aleda Grana) 50 MCG/ACT nasal spray  03/05/21   [provider]  hydrALAZINE (APRESOLINE) 25 MG  tablet Take 1 tablet (25 mg total) by mouth 3 (three) times daily. 05/16/22 11/11/22  Noralee Stain, DO  HYDROcodone-acetaminophen (NORCO/VICODIN) 5-325 MG tablet Take 1 tablet by mouth every 4 (four) hours as needed for moderate pain. 01/02/23 01/02/24  Elson Areas, PA-C  metFORMIN (GLUCOPHAGE) 500 MG tablet TAKE 1 TABLET BY MOUTH EVERY DAY WITH BREAKFAST 05/10/23   Georganna Skeans, MD  phentermine (ADIPEX-P) 37.5 MG tablet Take 1 tablet (37.5 mg total) by mouth daily before breakfast. 04/26/23   Georganna Skeans, MD  predniSONE (DELTASONE) 20 MG tablet Take 2 tablets (40 mg total) by mouth daily. 02/02/23   Valinda Hoar, NP   Respiratory Therapy Supplies (CARETOUCH CPAP MASK WIPES) MISC For cpap machine 07/29/21   Georganna Skeans, MD  sertraline (ZOLOFT) 100 MG tablet Take 1 tablet (100 mg total) by mouth daily. 11/11/22   Georganna Skeans, MD  spironolactone (ALDACTONE) 100 MG tablet Take 1 tablet (100 mg total) by mouth 2 (two) times daily. 03/18/23   Georganna Skeans, MD  spironolactone (ALDACTONE) 50 MG tablet Take 1 tablet (50 mg total) by mouth 2 (two) times daily. 11/11/22   Georganna Skeans, MD  valsartan (DIOVAN) 320 MG tablet Take 1 tablet (320 mg total) by mouth daily. 05/04/22   Georganna Skeans, MD      Allergies    Patient has no known allergies.    Review of Systems   Review of Systems  Respiratory:  Positive for shortness of breath.   All other systems reviewed and are negative.   Physical Exam Updated Vital Signs BP (!) 164/126   Pulse 81   Temp 97.9 F (36.6 C) (Oral)   Resp (!) 117   Ht 6' (1.829 m)   Wt (!) 190.5 kg   SpO2 99%   BMI 56.96 kg/m  Physical Exam Vitals and nursing note reviewed.   Gen: NAD Eyes: PERRL, EOMI HEENT: no oropharyngeal swelling Neck: trachea midline Resp: diminished at bilateral lung bases with rales Card: RRR, no murmurs, rubs, or gallops Abd: nontender, nondistended Extremities: no calf tenderness, no edema Vascular: 2+ radial pulses bilaterally, 2+ DP pulses bilaterally Skin: no rashes Psyc: acting appropriately   ED Results / Procedures / Treatments   Labs (all labs ordered are listed, but only abnormal results are displayed) Labs Reviewed  BASIC METABOLIC PANEL - Abnormal; Notable for the following components:      Result Value   BUN 24 (*)    Creatinine, Ser 1.67 (*)    GFR, Estimated 52 (*)    All other components within normal limits  CBC - Abnormal; Notable for the following components:   Hemoglobin 11.6 (*)    HCT 37.9 (*)    MCV 69.0 (*)    MCH 21.1 (*)    All other components within normal limits  TROPONIN I (HIGH SENSITIVITY) -  Abnormal; Notable for the following components:   Troponin I (High Sensitivity) 54 (*)    All other components within normal limits  TROPONIN I (HIGH SENSITIVITY) - Abnormal; Notable for the following components:   Troponin I (High Sensitivity) 62 (*)    All other components within normal limits  RESP PANEL BY RT-PCR (RSV, FLU A&B, COVID)  [id number]    EKG EKG Interpretation Date/Time:  Sunday July 31 2023 15:48:56 EDT Ventricular Rate:  88 PR Interval:  176 QRS Duration:  96 QT Interval:  418 QTC Calculation: 505 R Axis:   -38  Text Interpretation: Normal sinus rhythm Right atrial enlargement  Left axis deviation Pulmonary disease pattern Minimal voltage criteria for LVH, may be normal variant ( R in aVL ) Prolonged QT Abnormal ECG When compared with ECG of 31-Jul-2023 14:25, PREVIOUS ECG IS PRESENT Confirmed by Beckey Downing [phone]) on 07/31/2023 6:43:02 PM  Radiology DG Chest 2 View  Result Date: 07/31/2023 CLINICAL DATA:  Shortness of breath EXAM: CHEST - 2 VIEW COMPARISON:  05/14/2022 FINDINGS: The heart size and mediastinal contours are within normal limits. Cardiomegaly. Mild diffuse interstitial pulmonary opacity. Disc degenerative disease of the thoracic spine. IMPRESSION: Cardiomegaly with mild diffuse interstitial pulmonary opacity, most consistent with edema. No focal airspace opacity. Electronically Signed   By: Jearld Lesch M.D.   On: 07/31/2023 17:05    Procedures Procedures    Medications Ordered in ED Medications  albuterol (VENTOLIN HFA) 108 (90 Base) MCG/ACT inhaler 2 puff (2 puffs Inhalation Given 07/31/23 1704)  labetalol (NORMODYNE) injection 10 mg (has no administration in time range)  ipratropium-albuterol (DUONEB) 0.5-2.5 (3) MG/3ML nebulizer solution 3 mL (3 mLs Nebulization Given 07/31/23 1704)  furosemide (LASIX) tablet 40 mg (40 mg Oral Given 07/31/23 1854)    ED Course/ Medical Decision Making/ A&P                                 Medical Decision  Making 42 year old male with past medical history of these, hypertension, and CHF with ejection fraction of 40 to 45% presenting to the emergency department today with shortness of breath.  I will further evaluate the patient here with basic labs Wels and EKG, chest x-ray, and troponin for further evaluation for ACS, pulmonary edema, pulmonary infiltrates, pneumothorax.  The patient is PERC negative here.  Based on description of his symptoms suspicion for aortic pathology or pulmonary embolism is low at this time.  I will reevaluate for ultimate disposition.  Will also obtain a COVID and flu swab on the patient given the shortness of breath.  I will give him a DuoNeb although I do not hear a lot of wheezes here on exam.  He is slightly diminished.  Amount and/or Complexity of Data Reviewed Labs: ordered. Radiology: ordered.  Risk Prescription drug management. Decision regarding hospitalization.           Final Clinical Impression(s) / ED Diagnoses Final diagnoses:  Hypertensive urgency  Acute on chronic congestive heart failure, unspecified heart failure type [facility])    Rx / DC Orders ED Discharge Orders     None         Durwin Glaze, MD 07/31/23 2110

## 2023-08-01 ENCOUNTER — Other Ambulatory Visit (HOSPITAL_COMMUNITY): Payer: Self-pay

## 2023-08-01 ENCOUNTER — Inpatient Hospital Stay (HOSPITAL_COMMUNITY): Payer: 59

## 2023-08-01 ENCOUNTER — Encounter (HOSPITAL_COMMUNITY): Payer: Self-pay | Admitting: Internal Medicine

## 2023-08-01 DIAGNOSIS — I5033 Acute on chronic diastolic (congestive) heart failure: Secondary | ICD-10-CM

## 2023-08-01 LAB — BASIC METABOLIC PANEL
Anion gap: 9 (ref 5–15)
BUN: 20 mg/dL (ref 6–20)
CO2: 24 mmol/L (ref 22–32)
Calcium: 8.8 mg/dL — ABNORMAL LOW (ref 8.9–10.3)
Chloride: 106 mmol/L (ref 98–111)
Creatinine, Ser: 1.63 mg/dL — ABNORMAL HIGH (ref 0.61–1.24)
GFR, Estimated: 54 mL/min — ABNORMAL LOW (ref >60)
Glucose, Bld: 113 mg/dL — ABNORMAL HIGH (ref 70–99)
Potassium: 3.3 mmol/L — ABNORMAL LOW (ref 3.5–5.1)
Sodium: 139 mmol/L (ref 135–145)

## 2023-08-01 LAB — MAGNESIUM: Magnesium: 1.7 mg/dL (ref 1.7–2.4)

## 2023-08-01 LAB — CBC
HCT: 38.4 % — ABNORMAL LOW (ref 39.0–52.0)
Hemoglobin: 11.7 g/dL — ABNORMAL LOW (ref 13.0–17.0)
MCH: 20.8 pg — ABNORMAL LOW (ref 26.0–34.0)
MCHC: 30.5 g/dL (ref 30.0–36.0)
MCV: 68.3 fL — ABNORMAL LOW (ref 80.0–100.0)
Platelets: 297 10*3/uL (ref 150–400)
RBC: 5.62 MIL/uL (ref 4.22–5.81)
RDW: 15.1 % (ref 11.5–15.5)
WBC: 7.6 10*3/uL (ref 4.0–10.5)
nRBC: 0 % (ref 0.0–0.2)

## 2023-08-01 LAB — ECHOCARDIOGRAM COMPLETE
Area-P 1/2: 4.57 cm2
Calc EF: 23.6 %
Height: 71 in
S' Lateral: 4.1 cm
Single Plane A2C EF: 24.8 %
Single Plane A4C EF: 26.1 %
Weight: 6867.77 [oz_av]

## 2023-08-01 LAB — IRON AND TIBC
Iron: 37 ug/dL — ABNORMAL LOW (ref 45–182)
Saturation Ratios: 8 % — ABNORMAL LOW (ref 17.9–39.5)
TIBC: 449 ug/dL (ref 250–450)
UIBC: 412 ug/dL

## 2023-08-01 LAB — HIV ANTIBODY (ROUTINE TESTING W REFLEX): HIV Screen 4th Generation wRfx: NONREACTIVE

## 2023-08-01 LAB — FERRITIN: Ferritin: 22 ng/mL — ABNORMAL LOW (ref 24–336)

## 2023-08-01 MED ORDER — FUROSEMIDE 10 MG/ML IJ SOLN
60.0000 mg | Freq: Two times a day (BID) | INTRAMUSCULAR | Status: DC
  Administered 2023-08-01: 60 mg via INTRAVENOUS
  Filled 2023-08-01: qty 6

## 2023-08-01 MED ORDER — POTASSIUM CHLORIDE CRYS ER 20 MEQ PO TBCR
40.0000 meq | EXTENDED_RELEASE_TABLET | Freq: Two times a day (BID) | ORAL | Status: AC
  Administered 2023-08-01 (×2): 40 meq via ORAL
  Filled 2023-08-01 (×2): qty 2

## 2023-08-01 MED ORDER — ISOSORBIDE DINITRATE 10 MG PO TABS
10.0000 mg | ORAL_TABLET | Freq: Three times a day (TID) | ORAL | Status: DC
  Administered 2023-08-01 – 2023-08-04 (×9): 10 mg via ORAL
  Filled 2023-08-01 (×9): qty 1

## 2023-08-01 MED ORDER — MAGNESIUM SULFATE 2 GM/50ML IV SOLN
2.0000 g | Freq: Once | INTRAVENOUS | Status: AC
  Administered 2023-08-01: 2 g via INTRAVENOUS
  Filled 2023-08-01: qty 50

## 2023-08-01 MED ORDER — EMPAGLIFLOZIN 10 MG PO TABS
10.0000 mg | ORAL_TABLET | Freq: Every day | ORAL | Status: DC
  Administered 2023-08-01 – 2023-08-04 (×4): 10 mg via ORAL
  Filled 2023-08-01 (×4): qty 1

## 2023-08-01 NOTE — Progress Notes (Addendum)
Heart Failure Stewardship Pharmacist Progress Note   PCP: Georganna Skeans, MD PCP-Cardiologist: None    HPI:  [id number] initially presenting to UC with ShOB, tachypnea since 10/25. New-onset productive cough. Endorsing ShOB with activity. Advised to go to [facility] with prolonged QT on EKG. PMH includes CHF (EF 45-50% TTE 05/2022, remote hx of EF ~30%), obesity (BMI 59), CKD IIIa, asthma, HTN, OSA, T2DM, gout.   CXR 10/27 demonstrating cardiomegaly with diffuse interstitial pulmonary opacities c/w edema. Hypertensive in the ED with DBP to the 120s - treated with IV labetalol, PO hydralazine. Pending Echo this admission.   Upon interview, pt reports that his ShOB is about the same as on admission. Has not tried to fully lie down. Has not walked around the room much - but reports that shortness of breath did not get worse with activity today. He states that he recently changed insurance, so that was part of the reason he had not recently filled some of his medications. Recently switched pharmacies - has filled meds there once. Admits that adherence is a big issue for him. He leaves for work ~6AM and does not get home until 6-7PM. Was forgetting to take medications in the morning, rarely taking hydralazine three times daily. Has pill box at home, but was not using. Feels that maximizing once daily medications would make it easier for him to adhere to a regimen.   He denies AE to [place] in the past - unsure why he stopped taking this medication.   Current HF Medications: Diuretic: furosemide 60 mg IV BID Beta Blocker: carvedilol 25 mg PO BD MRA: spironolactone  25 mg PO daily Other: amlodipine 10 mg PO daily (HTN), hydralazine 50 mg PO TID  Prior to admission HF Medications: Beta blocker: carvedilol 25 mg PO BID (last filled 02/10/23 for 90ds) MRA: spironolactone 50 mg PO BID (not taking) SGLT2i: Farxiga (dapagliflozin) 10 mg PO daily (not taking) - unsure why discontinued, possibly price?  Other:  hydralazine 25 mg PO TID (not taking)  HTN PTA: amlodipine 10 mg PO daily  Pt reports that he had been running low on his medications - was stretching supplies.   Pertinent Lab Values: Serum creatinine 1.63 (at BL), BUN 20, Potassium 3.3, Sodium 139, BNP 342, Magnesium 1.7, A1c 5.5% (Aug 2023)  Vital Signs: Weight: 429.24 lbs (admission weight: 429.24 lbs) Blood pressure: 150-170s/100s  Heart rate: 70s  I/O: net -0.5L yesterday; net -1.9L since admission  Medication Assistance / Insurance Benefits Check: Does the patient have prescription insurance?  Yes Type of insurance plan: [facility]  Outpatient Pharmacy:  Prior to admission outpatient pharmacy: [facility] [address] Is the patient willing to use [facility] TOC pharmacy at discharge? Yes Is the patient willing to transition their outpatient pharmacy to utilize a [facility] outpatient pharmacy?   Pending - pt aware of free home delivery service.   Test Claims:  - Entresto: PA required - Jardiance: $5/30ds - Farxiga: not covered on insurance    Assessment: 1. 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 NYHA class III-IV symptoms given ShOB at rest. - Pt without LE edema, but retaining fluid per CXR. Appears to be responding well to IV diuresis based on UOP today. Scr at baseline. Appropriate to continue IV diuresis. Agree with addition of SGLT2i, especially with concomitant T2DM.  - Per chart review, appears that pt has a history of HFrEF with EF ~30% in 2008, now with reduced EF -  would benefit from treatment with ARNI. 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 With patient reported issues with adherence (specifically to TID hydralazine, with his work schedule), would favor titration to maximum dose ARNI and wean off of hydralazine as able.  - To optimize GDMT, if unable to escalate RAAS agents with c/f kidney function, consider addition of isosorbide mononitrate to hydralazine.  -  Pt continues to be hypertensive - on hydralazine, amlodipine, carvedilol, and spironolactone. May benefit from higher dose of carvedilol given obesity - would be ok to titrate outpatient since he just restarted this medication on 10/27. HR > 60 bpm.    Plan: 1) Medication changes recommended at this time: - Continue furosemide 60 mg IV BID - Continue spironolactone 25 mg PO daily - Continue empagliflozin (Jardiance) 10 mg PO daily - Continue carvedilol 25 mg PO BID - Continue hydralazine 50 mg PO Q8H- consider trying to wean off prior to discharge, as patient is unable to be adherent to TID medication with his work schedule - Consider initiation of sacubitril/valsartan (Entresto) 24-26 mg PO daily today or tomorrow  2) Patient assistance: - Patient eligible for manufacturer coupons for branded medications.   3)  Education  - Educated pt on importance of adherence to maintenance medications to improve heart function and prevent HF exacerbations. Pt aware of basic pathophysiology of disease state, and the impact of lifestyle factors on fluid accumulation. Pt aware that medication regimen will be adjusted while admitted and likely will continue to be adjusted after discharge. Full education to be completed prior to discharge.   Nils Pyle, PharmD PGY1 Pharmacy Resident

## 2023-08-01 NOTE — Consult Note (Addendum)
Advanced Heart Failure Team Consult Note   Primary Physician: Georganna Skeans, MD PCP-Cardiologist:  None  Reason for Consultation: Acute on chronic combined systolic and diastolic heart failure  HPI:    Leonard Lewis is seen today for evaluation of acute on chronic combined systolic and diastolic heart failure at the request of Dr. Jomarie Longs, Internal Medicine.   Leonard Lewis is a 42 y.o. male with medical history significant for chronic HFpEF (EF 45-50% by TTE 05/2022), CKD stage IIIa, HTN, HLD, microcytic anemia, asthma, gout, anxiety, OSA.  Previously admitted a year ago 8/23, for chest pain and shortness of breath with fever, was found to have sepsis secondary to community acquired pneumonia and treated with antibiotics. TTE remarkable for HfpEF EF 45-50.  Since then has had multiple ED visits this year for otitis media and frequent gout flares. Closely followed as an outpatient by PCP for hypertension, gout and weight management.   Pt presented to [facility] progressive shortness of breath on exertion and at rest with productive cough. Reports that up until this past week has been fully functional, works two jobs as a Dietitian. Family history significant for stroke- mother. Reports COVID infection in August/September. No GI symptoms. Has not been consistently taking his medications due to forgetfulness. On admission, pt hypertensive 187/123 and tachypnic. CXR on admission shows cardiomegaly with pulmonary edema. Labs notable for BUN/Cr 24/1.67, BNP 342, Trop 54>62. EKG 88bpm sinus rhythm. Echo below.  Cardiac Studies: LHC 10/13: widely patent coronaries Echo 8/23: EF 45-50%, LV w wall abnormalities, grade I diastolic dysfunction, aortic root dilation noted 40 mm. Echo 08/01/23: EF 25-30% global HK, LV severely dilated, grade III diastolic dysfunction, RV nl, LA dilated, small pericardial effusion, no aortic dilation. RA ~8.  Home Medications Prior to Admission  medications   Medication Sig Start Date End Date Taking? Authorizing Provider  albuterol (VENTOLIN HFA) 108 (90 Base) MCG/ACT inhaler Inhale 2 puffs into the lungs every 6 (six) hours as needed for wheezing or shortness of breath. 03/03/23  Yes Georganna Skeans, MD  allopurinol (ZYLOPRIM) 100 MG tablet Take 1 tablet (100 mg total) by mouth daily. 02/10/23  Yes Georganna Skeans, MD  amLODipine (NORVASC) 10 MG tablet TAKE 1 TABLET BY MOUTH EVERY DAY 05/10/23  Yes Georganna Skeans, MD  aspirin EC 81 MG tablet Take 81 mg by mouth daily. Swallow whole.   Yes [provider]  carvedilol (COREG) 25 MG tablet TAKE 1 TABLET BY MOUTH TWICE A DAY 05/10/23  Yes Georganna Skeans, MD  colchicine 0.6 MG tablet Take 1.2 mg ( 2 tablets)  by mouth  as a single dose at the first sign of gout flare, followed by 0.6 mg ( 1 tablet)  BY MOUTH 1 hour later, then 0.6 mg ( 1 tablet) daily for 6 days 02/02/23  Yes Salli Quarry R, NP  metFORMIN (GLUCOPHAGE) 500 MG tablet TAKE 1 TABLET BY MOUTH EVERY DAY WITH BREAKFAST 05/10/23  Yes Georganna Skeans, MD  Respiratory Therapy Supplies (CARETOUCH CPAP MASK WIPES) MISC For cpap machine 07/29/21  Yes Georganna Skeans, MD  sertraline (ZOLOFT) 100 MG tablet Take 1 tablet (100 mg total) by mouth daily. Patient taking differently: Take 100 mg by mouth daily as needed (for depression/anxiety). 11/11/22  Yes Georganna Skeans, MD  atorvastatin (LIPITOR) 40 MG tablet Take 1 tablet (40 mg total) by mouth daily. Patient not taking: Reported on 07/31/2023 12/07/21   Georganna Skeans, MD  hydrALAZINE (APRESOLINE) 25 MG tablet Take 1  tablet (25 mg total) by mouth 3 (three) times daily. Patient not taking: Reported on 07/31/2023 05/16/22 07/30/24  Noralee Stain, DO  spironolactone (ALDACTONE) 50 MG tablet Take 1 tablet (50 mg total) by mouth 2 (two) times daily. Patient not taking: Reported on 07/31/2023 11/11/22   Georganna Skeans, MD    Past Medical History: Past Medical History:  Diagnosis Date   Arthritis     Asthma    Back pain    CHF (congestive heart failure) (HCC) 2008   EF 40-50%         Dyslipidemia    Fatty liver    Hypertension    Leg edema    Obesity, morbid (HCC)    Obstructive sleep apnea    Prediabetes     Past Surgical History: Past Surgical History:  Procedure Laterality Date   CARDIAC CATHETERIZATION  Oct 2013   cardiac cath negative for obstructive disease -- did show end diastolic pressure secondary to systemic hypertension   LAPAROSCOPIC GASTRIC SLEEVE RESECTION N/A 01/27/2015   Procedure: LAPAROSCOPIC GASTRIC SLEEVE RESECTION WITH UPPER ENDOSCOPY;  Surgeon: Luretha Murphy, MD;  Location: [facility]S;  Service: General;  Laterality: N/A;   LEFT HEART CATHETERIZATION WITH CORONARY ANGIOGRAM N/A 08/03/2012   Procedure: LEFT HEART CATHETERIZATION WITH CORONARY ANGIOGRAM;  Surgeon: Tonny Bollman, MD;  Location: [facility] CATH LAB;  Service: Cardiovascular;  Laterality: N/A;   NO PAST SURGERIES      Family History: Family History  Problem Relation Age of Onset   Hypertension Mother    Obesity Mother    Hypertension Father     Social History: Social History   Socioeconomic History   Marital status: Married    Spouse name: Rickeda   Number of children: 2   Years of education: Not on file   Highest education level: Not on file  Occupational History   Occupation: Art gallery manager   Occupation: Training and development officer  Tobacco Use   Smoking status: Former    Current packs/day: 0.00    Types: Cigarettes    Quit date: 10/04/2006    Years since quitting: 16.8   Smokeless tobacco: Never  Vaping Use   Vaping status: Never Used  Substance and Sexual Activity   Alcohol use: No   Drug use: No   Sexual activity: Not on file  Other Topics Concern   Not on file  Social History Narrative   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    Social Determinants of Health   Financial Resource Strain: Medium Risk (08/01/2023)   Overall Financial Resource Strain (CARDIA)    Difficulty of Paying Living  Expenses: Somewhat hard  Food Insecurity: No Food Insecurity (07/31/2023)   Hunger Vital Sign    Worried About Running Out of Food in the Last Year: Never true    Ran Out of Food in the Last Year: Never true  Transportation Needs: No Transportation Needs (07/31/2023)   PRAPARE - Administrator, Civil Service (Medical): No    Lack of Transportation (Non-Medical): No  Physical Activity: Not on file  Stress: Not on file  Social Connections: Not on file    Allergies:  No Known Allergies  Objective:    Vital Signs:   Temp:  [97.9 F (36.6 C)-98.6 F (37 C)] 98.2 F (36.8 C) (10/28 1217) Pulse Rate:  [69-86] 85 (10/28 1217) Resp:  [17-117] 18 (10/28 1217) BP: (141-205)/(91-155) 141/91 (10/28 1217) SpO2:  [96 %-100 %] 96 % (10/28 1217) Weight:  [190.5 kg-194.7 kg] 194.7 kg (  10/28 0131) Last BM Date : 07/31/23  Weight change: Filed Weights   07/31/23 1840 07/31/23 2234 08/01/23 0131  Weight: (!) 190.5 kg (!) 194.7 kg (!) 194.7 kg    Intake/Output:   Intake/Output Summary (Last 24 hours) at 08/01/2023 1503 Last data filed at 08/01/2023 1400 Gross per 24 hour  Intake 720 ml  Output 2950 ml  Net -2230 ml      Physical Exam    General:  Well appearing. Obese. No resp difficulty HEENT: normal Neck: supple. JVP to mid neck. Carotids 2+ bilat; no bruits. No lymphadenopathy or thyromegaly appreciated. Cor: PMI nondisplaced. Regular rate & rhythm. No rubs, gallops or murmurs. Heart sounds distant Lungs: clear Abdomen: soft, nontender,obese. No hepatosplenomegaly. No bruits or masses. Good bowel sounds. Extremities: no cyanosis, clubbing, rash Neuro: alert & orientedx3, cranial nerves grossly intact. moves all 4 extremities w/o difficulty. Affect pleasant  Telemetry   SR in the 80s (personally reviewed)  EKG    SR 88bpm  Labs   Basic Metabolic Panel: Recent Labs  Lab 07/31/23 1623 08/01/23 0819  NA 141 139  K 3.8 3.3*  CL 107 106  CO2 24 24   GLUCOSE 95 113*  BUN 24* 20  CREATININE 1.67* 1.63*  CALCIUM 8.9 8.8*  MG  --  1.7    Liver Function Tests: No results for input(s): "AST", "ALT", "ALKPHOS", "BILITOT", "PROT", "ALBUMIN" in the last 168 hours. No results for input(s): "LIPASE", "AMYLASE" in the last 168 hours. No results for input(s): "AMMONIA" in the last 168 hours.  CBC: Recent Labs  Lab 07/31/23 1623 08/01/23 0819  WBC 10.5 7.6  HGB 11.6* 11.7*  HCT 37.9* 38.4*  MCV 69.0* 68.3*  PLT 312 297    Cardiac Enzymes: No results for input(s): "CKTOTAL", "CKMB", "CKMBINDEX", "TROPONINI" in the last 168 hours.  BNP: BNP (last 3 results) Recent Labs    07/31/23 1623  BNP 342.5*    ProBNP (last 3 results) No results for input(s): "PROBNP" in the last 8760 hours.   CBG: No results for input(s): "GLUCAP" in the last 168 hours.  Coagulation Studies: No results for input(s): "LABPROT", "INR" in the last 72 hours.   Imaging   ECHOCARDIOGRAM COMPLETE  Result Date: 08/01/2023    ECHOCARDIOGRAM REPORT   Patient Name:   Leonard Lewis Date of Exam: 08/01/2023 Medical Rec #:  [id number]       Height:       71.0 in Accession #:    [id number]      Weight:       429.2 lb Date of Birth:  Jan 01, 1981      BSA:          2.917 m Patient Age:    41 years        BP:           141/91 mmHg Patient Gender: M               HR:           75 bpm. Exam Location:  Inpatient Procedure: 2D Echo, Color Doppler and Cardiac Doppler Indications:    I50.9* Heart failure (unspecified)  History:        Patient has prior history of Echocardiogram examinations, most                 recent 05/15/2022. CHF; Risk Factors:Hypertension, Dyslipidemia                 and Sleep Apnea.  Sonographer:    Irving Burton Senior RDCS Referring Phys: [id number] VISHAL R PATEL IMPRESSIONS  1. Left ventricular ejection fraction, by estimation, is 25 to 30%. The left ventricle has severely decreased function. The left ventricle demonstrates global hypokinesis. The left  ventricular internal cavity size was severely dilated. There is mild concentric left ventricular hypertrophy. Left ventricular diastolic parameters are consistent with Grade III diastolic dysfunction (restrictive).  2. Right ventricular systolic function is normal. The right ventricular size is normal. Tricuspid regurgitation signal is inadequate for assessing PA pressure.  3. Left atrial size was severely dilated.  4. A small pericardial effusion is present. The pericardial effusion is posterior to the left ventricle.  5. The mitral valve is normal in structure. Trivial mitral valve regurgitation.  6. The aortic valve has an indeterminant number of cusps. Aortic valve regurgitation is trivial.  7. The inferior vena cava is normal in size with <50% respiratory variability, suggesting right atrial pressure of 8 mmHg. FINDINGS  Left Ventricle: Left ventricular ejection fraction, by estimation, is 25 to 30%. The left ventricle has severely decreased function. The left ventricle demonstrates global hypokinesis. The left ventricular internal cavity size was severely dilated. There is mild concentric left ventricular hypertrophy. Left ventricular diastolic parameters are consistent with Grade III diastolic dysfunction (restrictive). Right Ventricle: The right ventricular size is normal. No increase in right ventricular wall thickness. Right ventricular systolic function is normal. Tricuspid regurgitation signal is inadequate for assessing PA pressure. Left Atrium: Left atrial size was severely dilated. Right Atrium: Right atrial size was normal in size. Pericardium: A small pericardial effusion is present. The pericardial effusion is posterior to the left ventricle. Mitral Valve: The mitral valve is normal in structure. Trivial mitral valve regurgitation. Tricuspid Valve: The tricuspid valve is normal in structure. Tricuspid valve regurgitation is trivial. Aortic Valve: The aortic valve has an indeterminant number of  cusps. Aortic valve regurgitation is trivial. Pulmonic Valve: The pulmonic valve was normal in structure. Pulmonic valve regurgitation is trivial. Aorta: The aortic root and ascending aorta are structurally normal, with no evidence of dilitation. Venous: The inferior vena cava is normal in size with less than 50% respiratory variability, suggesting right atrial pressure of 8 mmHg. IAS/Shunts: No atrial level shunt detected by color flow Doppler.  LEFT VENTRICLE PLAX 2D LVIDd:         5.00 cm      Diastology LVIDs:         4.10 cm      LV e' medial:    6.84 cm/s LV PW:         1.30 cm      LV E/e' medial:  14.1 LV IVS:        1.30 cm      LV e' lateral:   9.64 cm/s LVOT diam:     2.30 cm      LV E/e' lateral: 10.0 LV SV:         76 LV SV Index:   26 LVOT Area:     4.15 cm  LV Volumes (MOD) LV vol d, MOD A2C: 298.0 ml LV vol d, MOD A4C: 234.0 ml LV vol s, MOD A2C: 224.0 ml LV vol s, MOD A4C: 173.0 ml LV SV MOD A2C:     74.0 ml LV SV MOD A4C:     234.0 ml LV SV MOD BP:      62.8 ml RIGHT VENTRICLE RV S prime:     11.50 cm/s TAPSE (M-mode): 2.2 cm  LEFT ATRIUM              Index        RIGHT ATRIUM           Index LA diam:        5.20 cm  1.78 cm/m   RA Area:     27.60 cm LA Vol (A2C):   144.0 ml 49.36 ml/m  RA Volume:   91.30 ml  31.30 ml/m LA Vol (A4C):   199.0 ml 68.22 ml/m LA Biplane Vol: 171.0 ml 58.62 ml/m  AORTIC VALVE LVOT Vmax:   90.90 cm/s LVOT Vmean:  64.100 cm/s LVOT VTI:    0.182 m  AORTA Ao Root diam: 3.50 cm Ao Asc diam:  3.60 cm MITRAL VALVE MV Area (PHT): 4.57 cm    SHUNTS MV Decel Time: 166 msec    Systemic VTI:  0.18 m MV E velocity: 96.70 cm/s  Systemic Diam: 2.30 cm MV A velocity: 41.40 cm/s MV E/A ratio:  2.34 Aditya Sabharwal Electronically signed by Dorthula Nettles Signature Date/Time: 08/01/2023/2:29:25 PM    Final    DG Chest 2 View  Result Date: 07/31/2023 CLINICAL DATA:  Shortness of breath EXAM: CHEST - 2 VIEW COMPARISON:  05/14/2022 FINDINGS: The heart size and mediastinal  contours are within normal limits. Cardiomegaly. Mild diffuse interstitial pulmonary opacity. Disc degenerative disease of the thoracic spine. IMPRESSION: Cardiomegaly with mild diffuse interstitial pulmonary opacity, most consistent with edema. No focal airspace opacity. Electronically Signed   By: Jearld Lesch M.D.   On: 07/31/2023 17:05     Medications:     Current Medications:  amLODipine  10 mg Oral Daily   atorvastatin  40 mg Oral Daily   carvedilol  25 mg Oral BID   empagliflozin  10 mg Oral Daily   enoxaparin (LOVENOX) injection  90 mg Subcutaneous Q24H   furosemide  60 mg Intravenous BID   hydrALAZINE  50 mg Oral Q8H   pneumococcal 20-valent conjugate vaccine  0.5 mL Intramuscular Tomorrow-1000   potassium chloride  40 mEq Oral BID   sertraline  100 mg Oral Daily   sodium chloride flush  3 mL Intravenous Q12H   spironolactone  25 mg Oral Daily    Infusions:     Patient Profile   Leonard Lewis is a 42 y.o. male with medical history significant for chronic HFpEF (EF 45-50% by TTE 05/2022), CKD stage IIIa, HTN, HLD, microcytic anemia, asthma, gout, anxiety, OSA. Now admitted for CHF exacerbation and shortness of breath.   Assessment/Plan   Acute on chronic combined systolic and diastolic heart failure - Likely related to poorly controlled essential hypertension, 187/123 on admission. Not consistent with medications. No CP.  - LHC from 2013 normal coronaries. - Echo 8/23: EF 45-50%, LV w wall abnormalities, grade I diastolic dysfunction, aortic root dilation noted 40 mm. - Echo 10/28: EF 25-30% global HK, LV severely dilated, grade III diastolic dysfunction, RV nl, LA dilated, small pericardial effusion, no aortic dilation. RA ~8.  Previously 8/23 EF 45-50 with grade I diastolic dysfunction and aortic root dilation - Does not appear to be low output. Patient warm. Mild AKI. Will check hepatic panel in the am.  - Volume status difficult to assess given body habitus, JVP to  mid neck. Lasix increased to 60 BID today. - Continue Jardiance 10 mg - Continue Spiro 25 mg  - Continue Coreg 25 mg  - Start ARNI tomorrow if Cr stable  Essential Hypertension - 187/123  on admit - Continue Hydralazine 50mg  TID - Continue Norvasc 10 mg - Start Isosorbide 10 mg TID  - Maximize GDMT as above, start ARNI tomorrow  AKI on CKD IIIa - baseline Cr 1.5, 1.6 today - Follow BMET  Severe OSA/Nocturnal Hypoxemia - CPAP at night  Obesity - BMI 59.9 - Previously on phentermine (stopped in May) - Previous gastric sleeve in 2016  Iron deficiency Anemia - Ferritin 22, T sat 8 - Dose Iron IV prior to discharge after need for imaging determined.  Hypokalemia - Replace for K <4  HLD  - Check Lipid panel  - Continue Lipitor 40  Length of Stay: 1  [place] Desjuan Stearns, NP  08/01/2023, 3:03 PM  Advanced Heart Failure Team Pager [phone] (M-F; 7a - 5p)  Please contact [facility] for night-coverage after hours (4p -7a ) and weekends on [email or web address]

## 2023-08-01 NOTE — Plan of Care (Signed)
Nutrition Education Note  RD consulted for nutrition education regarding acute on chronic HF.  RD obtained dietary recall. Pt goes to work at 6 am and will usually take coffee and a poptart or donut. For lunch he may have a grilled cheese, hamburger or [place] sandwich. In the afternoon if he has a snack, eh may have a bag of chips. Dinner they often may eat out.   RD provided "Heart Healthy Nutrition Therapy" handout from the Academy of [employer] and Dietetics. Reviewed patient's dietary recall. Provided examples on ways to decrease sodium intake in diet. Discouraged intake of processed foods and use of salt shaker. Encouraged fresh fruits and vegetables as well as whole grain sources of carbohydrates to maximize fiber intake.   RD discussed why it is important for patient to adhere to diet recommendations, and emphasized the role of fluids, foods to avoid, and importance of weighing self daily. Teach back method used.  Expect good compliance. Pt is motivated to make healthy, sustainable changes. Pt is agreeable to referral for ongoing outpatient nutrition education.   Body mass index is 59.87 kg/m. Pt meets criteria for morbid obesity based on current BMI.  Current diet order is heart healthy. No documented meal completions on file to review.  Labs and medications reviewed. No further nutrition interventions warranted at this time. RD contact information provided. If additional nutrition issues arise, please re-consult RD.   Drusilla Kanner, RDN, LDN Clinical Nutrition

## 2023-08-01 NOTE — Plan of Care (Signed)
  Problem: Education: Goal: Ability to demonstrate management of disease process will improve Outcome: Progressing   Problem: Education: Goal: Ability to verbalize understanding of medication therapies will improve Outcome: Progressing   Problem: Activity: Goal: Capacity to carry out activities will improve Outcome: Progressing   Problem: Cardiac: Goal: Ability to achieve and maintain adequate cardiopulmonary perfusion will improve Outcome: Progressing   Problem: Education: Goal: Knowledge of General Education information will improve Description: Including pain rating scale, medication(s)/side effects and non-pharmacologic comfort measures Outcome: Progressing   

## 2023-08-01 NOTE — Progress Notes (Incomplete)
Heart Failure Nurse Navigator Progress Note  PCP: Georganna Skeans, MD PCP-Cardiologist: *** Admission Diagnosis: *** Admitted from: ***  Presentation:   Leonard Lewis presented with ***  ECHO/ LVEF: ***  Clinical Course:  Past Medical History:  Diagnosis Date   Arthritis    Asthma    Back pain    CHF (congestive heart failure) (HCC) 2008   EF 40-50%         Dyslipidemia    Fatty liver    Hypertension    Leg edema    Obesity, morbid (HCC)    Obstructive sleep apnea    Prediabetes      Social History   Socioeconomic History   Marital status: Married    Spouse name: Not on file   Number of children: Not on file   Years of education: Not on file   Highest education level: Not on file  Occupational History   Occupation: Art gallery manager  Tobacco Use   Smoking status: Former    Current packs/day: 0.00    Types: Cigarettes    Quit date: 10/04/2006    Years since quitting: 16.8   Smokeless tobacco: Never  Vaping Use   Vaping status: Never Used  Substance and Sexual Activity   Alcohol use: No   Drug use: No   Sexual activity: Not on file  Other Topics Concern   Not on file  Social History Narrative   Employed:  Services [employer].    Social Determinants of Health   Financial Resource Strain: Not on file  Food Insecurity: No Food Insecurity (07/31/2023)   Hunger Vital Sign    Worried About Running Out of Food in the Last Year: Never true    Ran Out of Food in the Last Year: Never true  Transportation Needs: No Transportation Needs (07/31/2023)   PRAPARE - Administrator, Civil Service (Medical): No    Lack of Transportation (Non-Medical): No  Physical Activity: Not on file  Stress: Not on file  Social Connections: Not on file   Education Assessment and Provision:  Detailed education and instructions provided on heart failure disease management including the following:  Signs and symptoms of Heart Failure When to call the  physician Importance of daily weights Low sodium diet Fluid restriction Medication management Anticipated future follow-up appointments  Patient education given on each of the above topics.  Patient acknowledges understanding via teach back method and acceptance of all instructions.  Education Materials:  "Living Better With Heart Failure" Booklet, HF zone tool, & Daily Weight Tracker Tool.  Patient has scale at home: *** Patient has pill box at home: ***    High Risk Criteria for Readmission and/or Poor Patient Outcomes: Heart failure hospital admissions (last 6 months): ***  No Show rate: *** Difficult social situation: *** Demonstrates medication adherence: *** Primary Language: *** Literacy level: ***  Barriers of Care:   ***  Considerations/Referrals:   Referral made to Heart Failure Pharmacist Stewardship: *** Referral made to Heart Failure CSW/NCM TOC: *** Referral made to Heart & Vascular TOC clinic: ***  Items for Follow-up on DC/TOC: ***   ***

## 2023-08-01 NOTE — Progress Notes (Signed)
Echocardiogram 2D Echocardiogram has been performed.  Warren Lacy Reighlynn Swiney RDCS 08/01/2023, 2:07 PM

## 2023-08-01 NOTE — Progress Notes (Signed)
PROGRESS NOTE    Leonard Lewis  ZOX:[id number] DOB: [birth year] DOA: 07/31/2023 PCP: Georganna Skeans, MD  41/M with morbid obesity, OSA, chronic systolic CHF, CKD 3A, hypertension, microcytic anemia, asthma, gout, anxiety presented to the ED with progressive shortness of breath for few days. Originally diagnosed with heart failure in 2008, LHC in 2013 noted normal coronaries, briefly followed up with Dr. Sharyn Lull but then lost to follow-up for 5+ years. -Recently ran out of some cardiac meds. -In the ED hypertensive, labs with hemoglobin 11.6, sodium 141, creatinine 1.6, troponin 56, 62, chest x-ray with diffuse interstitial edema   Subjective: -Breathing a little better  Assessment and Plan:  Acute on chronic HFpEF: -Last EF 45-50% by TTE 05/2022, EF as low as 25-30% in 2015.  NICM by LHC in 2013.   -Presenting with volume overload  -Continue IV Lasix today, Coreg, Aldactone  -Add Jardiance -Consider low-dose ARB if kidney function tolerates -Strongly recommended diet and lifestyle modification -Dietitian consult -Needs to reestablish with cardiology   Hypertensive urgency: -Meds as above, continue Coreg, hydralazine, diuretics   CKD stage IIIa: -Stable, monitor  Morbid obesity BMI is 59.8, strongly recommended diet and lifestyle modification, may benefit from GLP-1 agonists   Asthma: Stable, Continue albuterol as needed.   Iron deficiency anemia -Give IV iron this admission, follow-up echo   Hyperlipidemia: Continue atorvastatin.   Depression/anxiety: Continue sertraline.   OSA: Not using CPAP at home counseled -Continue CPAP nightly   DVT prophylaxis: Lovenox Code Status: Full code, confirmed with patient on admission Family Communication: Wife at bedside Disposition Plan: Home in 48 hours if stable  Consultants:    Procedures:   Antimicrobials:    Objective: Vitals:   08/01/23 0041 08/01/23 0131 08/01/23 0333 08/01/23 0803  BP: (!) 145/102  (!)  150/101 (!) 162/99  Pulse: 69  71 78  Resp:   20 18  Temp: 98.2 F (36.8 C)  98.2 F (36.8 C)   TempSrc: Oral  Oral   SpO2: 96%  96% 98%  Weight:  (!) 194.7 kg    Height:        Intake/Output Summary (Last 24 hours) at 08/01/2023 0949 Last data filed at 08/01/2023 0900 Gross per 24 hour  Intake 720 ml  Output 2250 ml  Net -1530 ml   Filed Weights   07/31/23 1840 07/31/23 2234 08/01/23 0131  Weight: (!) 190.5 kg (!) 194.7 kg (!) 194.7 kg    Examination:  General exam: Morbidly obese chronically ill male sitting up in bed, HEENT: Neck obese unable to assess JVD CVS: S1-S2, regular rhythm Lungs: Decreased breath sounds to bases Abdomen: Soft, nontender, bowel sounds present Extremities: No edema Skin: No rashes Psychiatry:  Mood & affect appropriate.     Data Reviewed:   CBC: Recent Labs  Lab 07/31/23 1623 08/01/23 0819  WBC 10.5 7.6  HGB 11.6* 11.7*  HCT 37.9* 38.4*  MCV 69.0* 68.3*  PLT 312 297   Basic Metabolic Panel: Recent Labs  Lab 07/31/23 1623 08/01/23 0819  NA 141 139  K 3.8 3.3*  CL 107 106  CO2 24 24  GLUCOSE 95 113*  BUN 24* 20  CREATININE 1.67* 1.63*  CALCIUM 8.9 8.8*  MG  --  1.7   GFR: Estimated Creatinine Clearance: 102.8 mL/min (A) (by C-G formula based on SCr of 1.63 mg/dL (H)). Liver Function Tests: No results for input(s): "AST", "ALT", "ALKPHOS", "BILITOT", "PROT", "ALBUMIN" in the last 168 hours. No results for input(s): "LIPASE", "AMYLASE" in  the last 168 hours. No results for input(s): "AMMONIA" in the last 168 hours. Coagulation Profile: No results for input(s): "INR", "PROTIME" in the last 168 hours. Cardiac Enzymes: No results for input(s): "CKTOTAL", "CKMB", "CKMBINDEX", "TROPONINI" in the last 168 hours. BNP (last 3 results) No results for input(s): "PROBNP" in the last 8760 hours. HbA1C: No results for input(s): "HGBA1C" in the last 72 hours. CBG: No results for input(s): "GLUCAP" in the last 168 hours. Lipid  Profile: No results for input(s): "CHOL", "HDL", "LDLCALC", "TRIG", "CHOLHDL", "LDLDIRECT" in the last 72 hours. Thyroid Function Tests: No results for input(s): "TSH", "T4TOTAL", "FREET4", "T3FREE", "THYROIDAB" in the last 72 hours. Anemia Panel: Recent Labs    08/01/23 0819  FERRITIN 22*  TIBC 449  IRON 37*   Urine analysis: No results found for: "COLORURINE", "APPEARANCEUR", "LABSPEC", "PHURINE", "GLUCOSEU", "HGBUR", "BILIRUBINUR", "KETONESUR", "PROTEINUR", "UROBILINOGEN", "NITRITE", "LEUKOCYTESUR" Sepsis Labs: @LABRCNTIP (procalcitonin:4,lacticidven:4)  ) Recent Results (from the past 240 hour(s))  Resp panel by RT-PCR (RSV, Flu A&B, Covid) Anterior Nasal Swab     Status: None   Collection Time: 07/31/23  4:50 PM   Specimen: Anterior Nasal Swab  Result Value Ref Range Status   SARS Coronavirus 2 by RT PCR NEGATIVE NEGATIVE Final   Influenza A by PCR NEGATIVE NEGATIVE Final   Influenza B by PCR NEGATIVE NEGATIVE Final    Comment: (NOTE) The Xpert Xpress SARS-CoV-2/FLU/RSV plus assay is intended as an aid in the diagnosis of influenza from Nasopharyngeal swab specimens and should not be used as a sole basis for treatment. Nasal washings and aspirates are unacceptable for Xpert Xpress SARS-CoV-2/FLU/RSV testing.  Fact Sheet for Patients: BloggerCourse.com  Fact Sheet for Healthcare Providers: SeriousBroker.it  This test is not yet approved or cleared by the [place] FDA and has been authorized for detection and/or diagnosis of SARS-CoV-2 by FDA under an Emergency Use Authorization (EUA). This EUA will remain in effect (meaning this test can be used) for the duration of the COVID-19 declaration under Section 564(b)(1) of the Act, 21 U.S.C. section 360bbb-3(b)(1), unless the authorization is terminated or revoked.     Resp Syncytial Virus by PCR NEGATIVE NEGATIVE Final    Comment: (NOTE) Fact Sheet for  Patients: BloggerCourse.com  Fact Sheet for Healthcare Providers: SeriousBroker.it  This test is not yet approved or cleared by the [place] FDA and has been authorized for detection and/or diagnosis of SARS-CoV-2 by FDA under an Emergency Use Authorization (EUA). This EUA will remain in effect (meaning this test can be used) for the duration of the COVID-19 declaration under Section 564(b)(1) of the Act, 21 U.S.C. section 360bbb-3(b)(1), unless the authorization is terminated or revoked.  Performed at [facility], [address] [place], [place] [address]      Radiology Studies: DG Chest 2 View  Result Date: 07/31/2023 CLINICAL DATA:  Shortness of breath EXAM: CHEST - 2 VIEW COMPARISON:  05/14/2022 FINDINGS: The heart size and mediastinal contours are within normal limits. Cardiomegaly. Mild diffuse interstitial pulmonary opacity. Disc degenerative disease of the thoracic spine. IMPRESSION: Cardiomegaly with mild diffuse interstitial pulmonary opacity, most consistent with edema. No focal airspace opacity. Electronically Signed   By: Jearld Lesch M.D.   On: 07/31/2023 17:05     Scheduled Meds:  amLODipine  10 mg Oral Daily   atorvastatin  40 mg Oral Daily   carvedilol  25 mg Oral BID   enoxaparin (LOVENOX) injection  90 mg Subcutaneous Q24H   furosemide  40 mg Intravenous BID  hydrALAZINE  50 mg Oral Q8H   influenza vac split trivalent PF  0.5 mL Intramuscular Tomorrow-1000   pneumococcal 20-valent conjugate vaccine  0.5 mL Intramuscular Tomorrow-1000   sertraline  100 mg Oral Daily   sodium chloride flush  3 mL Intravenous Q12H   spironolactone  25 mg Oral Daily   Continuous Infusions:   LOS: 1 day    Time spent:    Zannie Cove, MD [facility]   08/01/2023, 9:49 AM

## 2023-08-02 ENCOUNTER — Telehealth (HOSPITAL_COMMUNITY): Payer: Self-pay | Admitting: Pharmacy Technician

## 2023-08-02 ENCOUNTER — Other Ambulatory Visit (HOSPITAL_COMMUNITY): Payer: Self-pay

## 2023-08-02 DIAGNOSIS — I5033 Acute on chronic diastolic (congestive) heart failure: Secondary | ICD-10-CM | POA: Diagnosis not present

## 2023-08-02 LAB — LIPID PANEL
Cholesterol: 137 mg/dL (ref 0–200)
HDL: 34 mg/dL — ABNORMAL LOW (ref >40)
LDL Cholesterol: 78 mg/dL (ref 0–99)
Total CHOL/HDL Ratio: 4 {ratio}
Triglycerides: 126 mg/dL (ref <150)
VLDL: 25 mg/dL (ref 0–40)

## 2023-08-02 LAB — BASIC METABOLIC PANEL
Anion gap: 11 (ref 5–15)
BUN: 25 mg/dL — ABNORMAL HIGH (ref 6–20)
CO2: 26 mmol/L (ref 22–32)
Calcium: 9.1 mg/dL (ref 8.9–10.3)
Chloride: 105 mmol/L (ref 98–111)
Creatinine, Ser: 2.08 mg/dL — ABNORMAL HIGH (ref 0.61–1.24)
GFR, Estimated: 40 mL/min — ABNORMAL LOW (ref >60)
Glucose, Bld: 111 mg/dL — ABNORMAL HIGH (ref 70–99)
Potassium: 4 mmol/L (ref 3.5–5.1)
Sodium: 142 mmol/L (ref 135–145)

## 2023-08-02 LAB — COMPREHENSIVE METABOLIC PANEL
ALT: 13 U/L (ref 0–44)
AST: 19 U/L (ref 15–41)
Albumin: 3 g/dL — ABNORMAL LOW (ref 3.5–5.0)
Alkaline Phosphatase: 55 U/L (ref 38–126)
Anion gap: 13 (ref 5–15)
BUN: 23 mg/dL — ABNORMAL HIGH (ref 6–20)
CO2: 24 mmol/L (ref 22–32)
Calcium: 9 mg/dL (ref 8.9–10.3)
Chloride: 105 mmol/L (ref 98–111)
Creatinine, Ser: 2.01 mg/dL — ABNORMAL HIGH (ref 0.61–1.24)
GFR, Estimated: 42 mL/min — ABNORMAL LOW (ref >60)
Glucose, Bld: 110 mg/dL — ABNORMAL HIGH (ref 70–99)
Potassium: 3.9 mmol/L (ref 3.5–5.1)
Sodium: 142 mmol/L (ref 135–145)
Total Bilirubin: 0.5 mg/dL (ref 0.3–1.2)
Total Protein: 6.4 g/dL — ABNORMAL LOW (ref 6.5–8.1)

## 2023-08-02 LAB — HEMOGLOBIN A1C
Hgb A1c MFr Bld: 5.9 % — ABNORMAL HIGH (ref 4.8–5.6)
Mean Plasma Glucose: 122.63 mg/dL

## 2023-08-02 LAB — MAGNESIUM: Magnesium: 2.1 mg/dL (ref 1.7–2.4)

## 2023-08-02 MED ORDER — IRON SUCROSE 20 MG/ML IV SOLN
500.0000 mg | Freq: Once | INTRAVENOUS | Status: AC
  Administered 2023-08-02: 500 mg via INTRAVENOUS
  Filled 2023-08-02: qty 25

## 2023-08-02 NOTE — Telephone Encounter (Signed)
Pharmacy Patient Advocate Encounter  Received notification from [facility] that Prior Authorization for Entresto 24-26MG  tablets  has been APPROVED from 08/02/2023 to 08/01/2024. Ran test claim, Copay is [phone] due to a $1500 deductible. This test claim was processed through [facility]- copay amounts may vary at other pharmacies due to pharmacy/plan contracts, or as the patient moves through the different stages of their insurance plan.   PA #/Case ID/Reference #: [id number]

## 2023-08-02 NOTE — TOC Initial Note (Signed)
Transition of Care [facility]) - Initial/Assessment Note    Patient Details  Name: Leonard Lewis MRN: [id number] Date of Birth: 1981-07-18  [facility]) CM/SW Contact:    Nicanor Bake Phone Number: [phone] 08/02/2023, 1:42 PM  Clinical Narrative:  HF CSW met with pt and wife at bedside. Pt stated that he lives at home with wife and minor children. Pt has no history of HH services . Pt stated that he has a scale at home. Pt stated that he uses a CPAP machine. CSW explained that a follow up hospital appointment with PCP will be scheduled closer to dc. Pt and wife agree and asked for an afternoon appointment.   TOC will continue following.                Expected Discharge Plan: Home/Self Care Barriers to Discharge: Continued Medical Work up   Patient Goals and CMS Choice            Expected Discharge Plan and Services       Living arrangements for the past 2 months: Single Family Home                                      Prior Living Arrangements/Services Living arrangements for the past 2 months: Single Family Home Lives with:: Minor Children Patient language and need for interpreter reviewed:: Yes Do you feel safe going back to the place where you live?: Yes      Need for Family Participation in Patient Care: No (Comment) Care giver support system in place?: Yes (comment)   Criminal Activity/Legal Involvement Pertinent to Current Situation/Hospitalization: No - Comment as needed  Activities of Daily Living   ADL Screening (condition at time of admission) Independently performs ADLs?: Yes (appropriate for developmental age) Is the patient deaf or have difficulty hearing?: No Does the patient have difficulty seeing, even when wearing glasses/contacts?: No Does the patient have difficulty concentrating, remembering, or making decisions?: No  Permission Sought/Granted                  Emotional Assessment Appearance:: Appears  older than stated age Attitude/Demeanor/Rapport: Engaged Affect (typically observed): Appropriate Orientation: : Oriented to Self, Oriented to Place, Oriented to  Time, Oriented to Situation Alcohol / Substance Use: Not Applicable Psych Involvement: No (comment)  Admission diagnosis:  Hypertensive urgency [I16.0] Acute on chronic congestive heart failure, unspecified heart failure type (HCC) [I50.9] Acute on chronic heart failure with preserved ejection fraction (HFpEF) (HCC) [I50.33] Patient Active Problem List   Diagnosis Date Noted   Acute on chronic heart failure with preserved ejection fraction (HFpEF) (HCC) 07/31/2023   Chronic kidney disease, stage 3a (HCC) 07/31/2023   Asthma 07/31/2023   Microcytic anemia 07/31/2023   Pain in joint of left elbow 03/03/2023   CAP (community acquired pneumonia) 05/14/2022   Community acquired pneumonia 05/14/2022   Acute left-sided low back pain with left-sided sciatica 11/06/2020   Gastroesophageal reflux disease without esophagitis 11/06/2020   Diarrhea 11/06/2020   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   Left foot pain 05/23/2020   Tonsillar hypertrophy 01/24/2020   Frequent nosebleeds 01/24/2020   Vitamin D deficiency 06/19/2019   Prediabetes 01/03/2018   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 [place] [address] Phone: [phone] Fax: [phone]  [id number] [place] [facility] - [address] [employer] [place] [address] Phone:  [phone] Fax: [phone]  [facility] [address] [place] [address] Phone: [phone] Fax: [phone]     Social Determinants of Health (SDOH) Social History: SDOH Screenings   Food Insecurity: No Food Insecurity (07/31/2023)  Housing: Low Risk  (08/01/2023)  Transportation Needs: No Transportation Needs (07/31/2023)  Utilities: Not At Risk (07/31/2023)  Alcohol Screen: Low Risk  (08/01/2023)  Depression (PHQ2-9): Low Risk  (03/03/2023)  Financial Resource Strain: Medium Risk (08/01/2023)  Tobacco Use: Medium Risk (07/31/2023)   SDOH Interventions: Housing Interventions: Intervention Not Indicated Alcohol Usage Interventions: Intervention Not Indicated (Score <7) Financial Strain Interventions: Intervention Not Indicated   Readmission Risk Interventions     No data to display

## 2023-08-02 NOTE — Progress Notes (Signed)
   08/02/23 0016  BiPAP/CPAP/SIPAP  $ Non-Invasive Home Ventilator  Subsequent  BiPAP/CPAP/SIPAP Pt Type Adult  BiPAP/CPAP/SIPAP Resmed  Mask Type Full face mask  Mask Size Large  Respiratory Rate 18 breaths/min  PEEP 15 cmH20  Patient Home Equipment No  Auto Titrate No

## 2023-08-02 NOTE — Plan of Care (Signed)
°  Problem: Education: Goal: Ability to verbalize understanding of medication therapies will improve Outcome: Progressing   Problem: Activity: Goal: Capacity to carry out activities will improve Outcome: Progressing   Problem: Cardiac: Goal: Ability to achieve and maintain adequate cardiopulmonary perfusion will improve Outcome: Progressing   Problem: Education: Goal: Knowledge of General Education information will improve Description: Including pain rating scale, medication(s)/side effects and non-pharmacologic comfort measures Outcome: Progressing   

## 2023-08-02 NOTE — Telephone Encounter (Signed)
Pharmacy Patient Advocate Encounter   Received notification that prior authorization for Entresto 24-26MG  tablets is required/requested.   Insurance verification completed.   The patient is insured through [facility] .   Per test claim: PA required; PA submitted to [facility] via CoverMyMeds Key/confirmation #/EOC [id number] Status is pending

## 2023-08-02 NOTE — Progress Notes (Signed)
Heart Failure Navigator Progress Note  Assessed for Heart & Vascular TOC clinic readiness.  Patient does not meet criteria due to Advanced Heart Failure Team consulted. .   Navigator will sign off at this time.   Dawn Fields, BSN, RN Heart Failure Nurse Navigator Secure Chat Only   

## 2023-08-02 NOTE — Progress Notes (Signed)
Advanced Heart Failure Rounding Note  PCP-Cardiologist: None   Subjective:    Improved hypertension control overnight.  Cr bump 1.63>>2.01 with addition of SGLT2i/spiro + diuresis. Weight down from yesterday, made 3.4L UOP.  Feeling well this morning, was able to lay flat last night without SOB. No CP. Wife at the bedside.  Objective:   Weight Range: (!) 188.1 kg Body mass index is 57.84 kg/m.   Vital Signs:   Temp:  [97.4 F (36.3 C)-98.2 F (36.8 C)] 97.6 F (36.4 C) (10/29 0508) Pulse Rate:  [65-85] 77 (10/29 0508) Resp:  [17-20] 17 (10/29 0508) BP: (123-162)/(82-105) 136/87 (10/29 0508) SpO2:  [94 %-98 %] 96 % (10/29 0508) Weight:  [188.1 kg] 188.1 kg (10/29 0508) Last BM Date : 07/31/23  Weight change: Filed Weights   07/31/23 2234 08/01/23 0131 08/02/23 0508  Weight: (!) 194.7 kg (!) 194.7 kg (!) 188.1 kg    Intake/Output:   Intake/Output Summary (Last 24 hours) at 08/02/2023 0735 Last data filed at 08/02/2023 0524 Gross per 24 hour  Intake 480 ml  Output 3400 ml  Net -2920 ml    Physical Exam    General:  Well appearing. No resp difficulty on room air HEENT: Normal Neck: Supple. No JVD. Carotids 2+ bilat; no bruits. No lymphadenopathy or thyromegaly appreciated. Cor: PMI nondisplaced. Regular rate & rhythm. No rubs, gallops or murmurs. Lungs: Distant Abdomen: Soft, non tender, rounded, obese. No hepatosplenomegaly. + BS Extremities: No cyanosis, clubbing, rash, no edema Neuro: Alert & orientedx3, cranial nerves grossly intact. moves all 4 extremities w/o difficulty. Affect pleasant  Telemetry   SR 60-70s (personally reviewed)  EKG    N/A  Labs    CBC Recent Labs    07/31/23 1623 08/01/23 0819  WBC 10.5 7.6  HGB 11.6* 11.7*  HCT 37.9* 38.4*  MCV 69.0* 68.3*  PLT 312 297   Basic Metabolic Panel Recent Labs    95/62/13 0819 08/02/23 0358  NA 139 142  K 3.3* 3.9  CL 106 105  CO2 24 24  GLUCOSE 113* 110*  BUN 20 23*   CREATININE 1.63* 2.01*  CALCIUM 8.8* 9.0  MG 1.7 2.1   Liver Function Tests Recent Labs    08/02/23 0358  AST 19  ALT 13  ALKPHOS 55  BILITOT 0.5  PROT 6.4*  ALBUMIN 3.0*    Imaging    ECHOCARDIOGRAM COMPLETE  Result Date: 08/01/2023    ECHOCARDIOGRAM REPORT   Patient Name:   ESGAR WAYMIRE Date of Exam: 08/01/2023 Medical Rec #:  [id number]       Height:       71.0 in Accession #:    [id number]      Weight:       429.2 lb Date of Birth:  [birth year]      BSA:          2.917 m Patient Age:    42 years        BP:           141/91 mmHg Patient Gender: M               HR:           75 bpm. Exam Location:  Inpatient Procedure: 2D Echo, Color Doppler and Cardiac Doppler Indications:    I50.9* Heart failure (unspecified)  History:        Patient has prior history of Echocardiogram examinations, most  recent 05/15/2022. CHF; Risk Factors:Hypertension, Dyslipidemia                 and Sleep Apnea.  Sonographer:    Irving Burton Senior RDCS Referring Phys: [id number] VISHAL R PATEL IMPRESSIONS  1. Left ventricular ejection fraction, by estimation, is 25 to 30%. The left ventricle has severely decreased function. The left ventricle demonstrates global hypokinesis. The left ventricular internal cavity size was severely dilated. There is mild concentric left ventricular hypertrophy. Left ventricular diastolic parameters are consistent with Grade III diastolic dysfunction (restrictive).  2. Right ventricular systolic function is normal. The right ventricular size is normal. Tricuspid regurgitation signal is inadequate for assessing PA pressure.  3. Left atrial size was severely dilated.  4. A small pericardial effusion is present. The pericardial effusion is posterior to the left ventricle.  5. The mitral valve is normal in structure. Trivial mitral valve regurgitation.  6. The aortic valve has an indeterminant number of cusps. Aortic valve regurgitation is trivial.  7. The inferior vena cava is normal  in size with <50% respiratory variability, suggesting right atrial pressure of 8 mmHg. FINDINGS  Left Ventricle: Left ventricular ejection fraction, by estimation, is 25 to 30%. The left ventricle has severely decreased function. The left ventricle demonstrates global hypokinesis. The left ventricular internal cavity size was severely dilated. There is mild concentric left ventricular hypertrophy. Left ventricular diastolic parameters are consistent with Grade III diastolic dysfunction (restrictive). Right Ventricle: The right ventricular size is normal. No increase in right ventricular wall thickness. Right ventricular systolic function is normal. Tricuspid regurgitation signal is inadequate for assessing PA pressure. Left Atrium: Left atrial size was severely dilated. Right Atrium: Right atrial size was normal in size. Pericardium: A small pericardial effusion is present. The pericardial effusion is posterior to the left ventricle. Mitral Valve: The mitral valve is normal in structure. Trivial mitral valve regurgitation. Tricuspid Valve: The tricuspid valve is normal in structure. Tricuspid valve regurgitation is trivial. Aortic Valve: The aortic valve has an indeterminant number of cusps. Aortic valve regurgitation is trivial. Pulmonic Valve: The pulmonic valve was normal in structure. Pulmonic valve regurgitation is trivial. Aorta: The aortic root and ascending aorta are structurally normal, with no evidence of dilitation. Venous: The inferior vena cava is normal in size with less than 50% respiratory variability, suggesting right atrial pressure of 8 mmHg. IAS/Shunts: No atrial level shunt detected by color flow Doppler.  LEFT VENTRICLE PLAX 2D LVIDd:         5.00 cm      Diastology LVIDs:         4.10 cm      LV e' medial:    6.84 cm/s LV PW:         1.30 cm      LV E/e' medial:  14.1 LV IVS:        1.30 cm      LV e' lateral:   9.64 cm/s LVOT diam:     2.30 cm      LV E/e' lateral: 10.0 LV SV:         76 LV  SV Index:   26 LVOT Area:     4.15 cm  LV Volumes (MOD) LV vol d, MOD A2C: 298.0 ml LV vol d, MOD A4C: 234.0 ml LV vol s, MOD A2C: 224.0 ml LV vol s, MOD A4C: 173.0 ml LV SV MOD A2C:     74.0 ml LV SV MOD A4C:     234.0 ml  LV SV MOD BP:      62.8 ml RIGHT VENTRICLE RV S prime:     11.50 cm/s TAPSE (M-mode): 2.2 cm LEFT ATRIUM              Index        RIGHT ATRIUM           Index LA diam:        5.20 cm  1.78 cm/m   RA Area:     27.60 cm LA Vol (A2C):   144.0 ml 49.36 ml/m  RA Volume:   91.30 ml  31.30 ml/m LA Vol (A4C):   199.0 ml 68.22 ml/m LA Biplane Vol: 171.0 ml 58.62 ml/m  AORTIC VALVE LVOT Vmax:   90.90 cm/s LVOT Vmean:  64.100 cm/s LVOT VTI:    0.182 m  AORTA Ao Root diam: 3.50 cm Ao Asc diam:  3.60 cm MITRAL VALVE MV Area (PHT): 4.57 cm    SHUNTS MV Decel Time: 166 msec    Systemic VTI:  0.18 m MV E velocity: 96.70 cm/s  Systemic Diam: 2.30 cm MV A velocity: 41.40 cm/s MV E/A ratio:  2.34 Trevar Boehringer Electronically signed by Dorthula Nettles Signature Date/Time: 08/01/2023/2:29:25 PM    Final      Medications:     Scheduled Medications:  amLODipine  10 mg Oral Daily   atorvastatin  40 mg Oral Daily   carvedilol  25 mg Oral BID   empagliflozin  10 mg Oral Daily   enoxaparin (LOVENOX) injection  90 mg Subcutaneous Q24H   furosemide  60 mg Intravenous BID   hydrALAZINE  50 mg Oral Q8H   isosorbide dinitrate  10 mg Oral TID   pneumococcal 20-valent conjugate vaccine  0.5 mL Intramuscular Tomorrow-1000   sertraline  100 mg Oral Daily   sodium chloride flush  3 mL Intravenous Q12H   spironolactone  25 mg Oral Daily     Patient Profile   Leonard Lewis is a 42 y.o. male with medical history significant for chronic HFpEF (EF 45-50% by TTE 05/2022), CKD stage IIIa, HTN, HLD, microcytic anemia, asthma, gout, anxiety, OSA. Now admitted for CHF exacerbation and shortness of breath.   Assessment/Plan   Acute on chronic combined systolic and diastolic heart failure - Likely  related to poorly controlled essential hypertension, 187/123 on admission. Not consistent with medications. No CP.  - LHC from 2013 normal coronaries. - Echo 8/23: EF 45-50%, LV w wall abnormalities, grade I diastolic dysfunction, aortic root dilation noted 40 mm. - Echo 10/28: EF 25-30% global HK, LV severely dilated, grade III diastolic dysfunction, RV nl, LA dilated, small pericardial effusion, no aortic dilation. RA ~8.  Previously 8/23 EF 45-50 with grade I diastolic dysfunction and aortic root dilation - Does not appear to be low output. Patient warm. Mild AKI. AST/ALT normal. - Appears dry on exam. Cr bump. Hold diuresis today - Continue Jardiance 10 mg - Continue Spiro 25 mg  - Continue Coreg 25 mg  - Cr bump with addition of GDMT and diuresis 1.63>>2.01 - Start ARNI tomorrow if Cr stable   Essential Hypertension - 187/123 on admit - Continue Hydralazine 50 mg TID - Continue Norvasc 10 mg - Start Isosorbide 10 mg TID  - Maximize GDMT as above, start ARNI when able   AKI on CKD IIIa - 1.6 on admit (baseline ~1.5) - Cr bump with addition GDMT and diuresis 1.63>>2.01  - Repeat BMP this afternoon   Severe OSA/Nocturnal Hypoxemia -  CPAP at night   Obesity - BMI 59.9 - s/p gastric sleeve in 2013 - Previously on phentermine (stopped in May)   Iron deficiency Anemia - Ferritin 22, T sat 8 - Dose Iron IV prior to discharge after need for imaging determined.   Hypokalemia - Replace for K <4   HLD  - LDL 78, HDL 34 - Continue Lipitor 40  Length of Stay: 2  [place] Lee, NP  08/02/2023, 7:35 AM  Advanced Heart Failure Team Pager [phone] (M-F; 7a - 5p)  Please contact [facility] for night-coverage after hours (5p -7a ) and weekends on [email or web address]  Patient seen with PA/NP, agree with the above note.   Subjective: - Feels "better" but still becomes quickly short of breath.    Exam: General: NAD HEENT: Normal.  Neck: Thick neck. JVP difficult to visualize; does  not appear severely elevated, no thyromegaly or thyroid nodule.  Lungs: Clear to auscultation bilaterally with normal respiratory effort. CV:  Heart regular S1/S2, no S3/S4, no murmur.  No peripheral edema.    Abdomen: Soft, nontender, no hepatosplenomegaly, no distention.  Skin: Intact without lesions or rashes.  Neurologic: awake/alert, no gross FND.  Psych: Normal affect. Extremities: No clubbing or cyanosis.    A/P - 3.4L urine output yesterday with rise in sCr today. Will hold off on further diuretics.  - Plan to add on [facility] tomorrow pending renal function.  - Ferritin 22; iron level reduced. Will start IV iron today.    Vernel Langenderfer Advanced Heart Failure

## 2023-08-02 NOTE — Progress Notes (Addendum)
Date of Birth:  08-31-1981      BSA:          2.917 m Patient Age:    41 years        BP:           141/91 mmHg Patient Gender: M               HR:           75 bpm. Exam Location:  Inpatient Procedure: 2D Echo, Color Doppler and Cardiac Doppler Indications:    I50.9* Heart failure (unspecified)  History:        Patient has prior history of Echocardiogram examinations, most                 recent 05/15/2022. CHF; Risk Factors:Hypertension, Dyslipidemia                 and Sleep Apnea.  Sonographer:    Irving Burton Senior RDCS Referring Phys: [id number] VISHAL R PATEL IMPRESSIONS  1. Left ventricular ejection fraction, by estimation, is 25 to 30%. The left ventricle has severely decreased function. The left ventricle demonstrates global hypokinesis. The left ventricular internal cavity size was severely dilated. There is mild concentric left  ventricular hypertrophy. Left ventricular diastolic parameters are consistent with Grade III diastolic dysfunction (restrictive).  2. Right ventricular systolic function is normal. The right ventricular size is normal. Tricuspid regurgitation signal is inadequate for assessing PA pressure.  3. Left atrial size was severely dilated.  4. A small pericardial effusion is present. The pericardial effusion is posterior to the left ventricle.  5. The mitral valve is normal in structure. Trivial mitral valve regurgitation.  6. The aortic valve has an indeterminant number of cusps. Aortic valve regurgitation is trivial.  7. The inferior vena cava is normal in size with <50% respiratory variability, suggesting right atrial pressure of 8 mmHg. FINDINGS  Left Ventricle: Left ventricular ejection fraction, by estimation, is 25 to 30%. The left ventricle has severely decreased function. The left ventricle demonstrates global hypokinesis. The left ventricular internal cavity size was severely dilated. There is mild concentric left ventricular hypertrophy. Left ventricular diastolic parameters are consistent with Grade III diastolic dysfunction (restrictive). Right Ventricle: The right ventricular size is normal. No increase in right ventricular wall thickness. Right ventricular systolic function is normal. Tricuspid regurgitation signal is inadequate for assessing PA pressure. Left Atrium: Left atrial size was severely dilated. Right Atrium: Right atrial size was normal in size. Pericardium: A small pericardial effusion is present. The pericardial effusion is posterior to the left ventricle. Mitral Valve: The mitral valve is normal in structure. Trivial mitral valve regurgitation. Tricuspid Valve: The tricuspid valve is normal in structure. Tricuspid valve regurgitation is trivial. Aortic Valve: The aortic valve has an indeterminant number of cusps. Aortic valve regurgitation is trivial. Pulmonic Valve: The pulmonic valve was  normal in structure. Pulmonic valve regurgitation is trivial. Aorta: The aortic root and ascending aorta are structurally normal, with no evidence of dilitation. Venous: The inferior vena cava is normal in size with less than 50% respiratory variability, suggesting right atrial pressure of 8 mmHg. IAS/Shunts: No atrial level shunt detected by color flow Doppler.  LEFT VENTRICLE PLAX 2D LVIDd:         5.00 cm      Diastology LVIDs:         4.10 cm      LV e' medial:    6.84 cm/s LV PW:  PROGRESS NOTE    Leonard Lewis  RUE:[id number] DOB: [birth year] DOA: 07/31/2023 PCP: Georganna Skeans, MD  41/M with morbid obesity, OSA, chronic systolic CHF, CKD 3A, hypertension, microcytic anemia, asthma, gout, anxiety presented to the ED with progressive shortness of breath for few days. Originally diagnosed with heart failure in 2008, LHC in 2013 noted normal coronaries, briefly followed up with Dr. Sharyn Lull but then lost to follow-up for 5+ years. -Recently ran out of some cardiac meds. -In the ED hypertensive, labs with hemoglobin 11.6, sodium 141, creatinine 1.6, troponin 56, 62, chest x-ray with diffuse interstitial edema   Subjective: -Breathing a little better  Assessment and Plan:  Acute on chronic HFpEF: -Last EF 45-50% by TTE 05/2022, EF as low as 25-30% in 2015.  NICM by LHC in 2013.   -Presenting with volume overload  -Repeat echo with EF 25-30%, global hypokinesis, grade 3 diastolic dysfunction, normal RV -Diuresed with IV Lasix, weight down 15 LB, creatinine trending up to 2.0 today  -Heart failure team following, holding IV Lasix, continue Jardiance, Aldactone, Coreg    Hypertensive urgency: -Meds as above, continue Coreg, hydralazine, diuretics   AKI on CKD stage IIIa: -Worsening, holding diuretics today -Avoid hypotension, BMP in a.m.  Morbid obesity BMI is 59.8, strongly recommended diet and lifestyle modification, may benefit from GLP-1 agonists   Asthma: Stable, Continue albuterol as needed.   Iron deficiency anemia -Will administer IV iron, no plans for cardiac MRI   Hyperlipidemia: Continue atorvastatin.   Depression/anxiety: Continue sertraline.   OSA: Not using CPAP at home counseled -Continue CPAP nightly   DVT prophylaxis: Lovenox Code Status: Full code, confirmed with patient on admission Family Communication: Wife at bedside Disposition Plan: Home in 1-2 days  Consultants:    Procedures:   Antimicrobials:     Objective: Vitals:   08/02/23 0039 08/02/23 0508 08/02/23 0806 08/02/23 1214  BP: (!) 123/90 136/87 (!) 152/99 122/78  Pulse: 65 77 82 71  Resp: 20 17 18 18   Temp: (!) 97.4 F (36.3 C) 97.6 F (36.4 C) 97.6 F (36.4 C) 97.6 F (36.4 C)  TempSrc: Oral Oral Oral Oral  SpO2: 94% 96% 96% 96%  Weight:  (!) 188.1 kg    Height:        Intake/Output Summary (Last 24 hours) at 08/02/2023 1458 Last data filed at 08/02/2023 0524 Gross per 24 hour  Intake 480 ml  Output 2300 ml  Net -1820 ml   Filed Weights   07/31/23 2234 08/01/23 0131 08/02/23 0508  Weight: (!) 194.7 kg (!) 194.7 kg (!) 188.1 kg    Examination:  General exam: Morbidly obese male chronically ill sitting up in bed HEENT: Neck obese unable to assess JVD CVS: S1-S2, regular rhythm Lungs: Distant breath sounds otherwise clear Abdomen: Soft, nontender, bowel sounds present Extremities: No edema Skin: No rashes Psychiatry:  Mood & affect appropriate.     Data Reviewed:   CBC: Recent Labs  Lab 07/31/23 1623 08/01/23 0819  WBC 10.5 7.6  HGB 11.6* 11.7*  HCT 37.9* 38.4*  MCV 69.0* 68.3*  PLT 312 297   Basic Metabolic Panel: Recent Labs  Lab 07/31/23 1623 08/01/23 0819 08/02/23 0358  NA 141 139 142  K 3.8 3.3* 3.9  CL 107 106 105  CO2 24 24 24   GLUCOSE 95 113* 110*  BUN 24* 20 23*  CREATININE 1.67* 1.63* 2.01*  CALCIUM 8.9 8.8* 9.0  MG  --  1.7 2.1   GFR: Estimated Creatinine Clearance: 81.5  Date of Birth:  08-31-1981      BSA:          2.917 m Patient Age:    41 years        BP:           141/91 mmHg Patient Gender: M               HR:           75 bpm. Exam Location:  Inpatient Procedure: 2D Echo, Color Doppler and Cardiac Doppler Indications:    I50.9* Heart failure (unspecified)  History:        Patient has prior history of Echocardiogram examinations, most                 recent 05/15/2022. CHF; Risk Factors:Hypertension, Dyslipidemia                 and Sleep Apnea.  Sonographer:    Irving Burton Senior RDCS Referring Phys: [id number] VISHAL R PATEL IMPRESSIONS  1. Left ventricular ejection fraction, by estimation, is 25 to 30%. The left ventricle has severely decreased function. The left ventricle demonstrates global hypokinesis. The left ventricular internal cavity size was severely dilated. There is mild concentric left  ventricular hypertrophy. Left ventricular diastolic parameters are consistent with Grade III diastolic dysfunction (restrictive).  2. Right ventricular systolic function is normal. The right ventricular size is normal. Tricuspid regurgitation signal is inadequate for assessing PA pressure.  3. Left atrial size was severely dilated.  4. A small pericardial effusion is present. The pericardial effusion is posterior to the left ventricle.  5. The mitral valve is normal in structure. Trivial mitral valve regurgitation.  6. The aortic valve has an indeterminant number of cusps. Aortic valve regurgitation is trivial.  7. The inferior vena cava is normal in size with <50% respiratory variability, suggesting right atrial pressure of 8 mmHg. FINDINGS  Left Ventricle: Left ventricular ejection fraction, by estimation, is 25 to 30%. The left ventricle has severely decreased function. The left ventricle demonstrates global hypokinesis. The left ventricular internal cavity size was severely dilated. There is mild concentric left ventricular hypertrophy. Left ventricular diastolic parameters are consistent with Grade III diastolic dysfunction (restrictive). Right Ventricle: The right ventricular size is normal. No increase in right ventricular wall thickness. Right ventricular systolic function is normal. Tricuspid regurgitation signal is inadequate for assessing PA pressure. Left Atrium: Left atrial size was severely dilated. Right Atrium: Right atrial size was normal in size. Pericardium: A small pericardial effusion is present. The pericardial effusion is posterior to the left ventricle. Mitral Valve: The mitral valve is normal in structure. Trivial mitral valve regurgitation. Tricuspid Valve: The tricuspid valve is normal in structure. Tricuspid valve regurgitation is trivial. Aortic Valve: The aortic valve has an indeterminant number of cusps. Aortic valve regurgitation is trivial. Pulmonic Valve: The pulmonic valve was  normal in structure. Pulmonic valve regurgitation is trivial. Aorta: The aortic root and ascending aorta are structurally normal, with no evidence of dilitation. Venous: The inferior vena cava is normal in size with less than 50% respiratory variability, suggesting right atrial pressure of 8 mmHg. IAS/Shunts: No atrial level shunt detected by color flow Doppler.  LEFT VENTRICLE PLAX 2D LVIDd:         5.00 cm      Diastology LVIDs:         4.10 cm      LV e' medial:    6.84 cm/s LV PW:  PROGRESS NOTE    Leonard Lewis  RUE:[id number] DOB: [birth year] DOA: 07/31/2023 PCP: Georganna Skeans, MD  41/M with morbid obesity, OSA, chronic systolic CHF, CKD 3A, hypertension, microcytic anemia, asthma, gout, anxiety presented to the ED with progressive shortness of breath for few days. Originally diagnosed with heart failure in 2008, LHC in 2013 noted normal coronaries, briefly followed up with Dr. Sharyn Lull but then lost to follow-up for 5+ years. -Recently ran out of some cardiac meds. -In the ED hypertensive, labs with hemoglobin 11.6, sodium 141, creatinine 1.6, troponin 56, 62, chest x-ray with diffuse interstitial edema   Subjective: -Breathing a little better  Assessment and Plan:  Acute on chronic HFpEF: -Last EF 45-50% by TTE 05/2022, EF as low as 25-30% in 2015.  NICM by LHC in 2013.   -Presenting with volume overload  -Repeat echo with EF 25-30%, global hypokinesis, grade 3 diastolic dysfunction, normal RV -Diuresed with IV Lasix, weight down 15 LB, creatinine trending up to 2.0 today  -Heart failure team following, holding IV Lasix, continue Jardiance, Aldactone, Coreg    Hypertensive urgency: -Meds as above, continue Coreg, hydralazine, diuretics   AKI on CKD stage IIIa: -Worsening, holding diuretics today -Avoid hypotension, BMP in a.m.  Morbid obesity BMI is 59.8, strongly recommended diet and lifestyle modification, may benefit from GLP-1 agonists   Asthma: Stable, Continue albuterol as needed.   Iron deficiency anemia -Will administer IV iron, no plans for cardiac MRI   Hyperlipidemia: Continue atorvastatin.   Depression/anxiety: Continue sertraline.   OSA: Not using CPAP at home counseled -Continue CPAP nightly   DVT prophylaxis: Lovenox Code Status: Full code, confirmed with patient on admission Family Communication: Wife at bedside Disposition Plan: Home in 1-2 days  Consultants:    Procedures:   Antimicrobials:     Objective: Vitals:   08/02/23 0039 08/02/23 0508 08/02/23 0806 08/02/23 1214  BP: (!) 123/90 136/87 (!) 152/99 122/78  Pulse: 65 77 82 71  Resp: 20 17 18 18   Temp: (!) 97.4 F (36.3 C) 97.6 F (36.4 C) 97.6 F (36.4 C) 97.6 F (36.4 C)  TempSrc: Oral Oral Oral Oral  SpO2: 94% 96% 96% 96%  Weight:  (!) 188.1 kg    Height:        Intake/Output Summary (Last 24 hours) at 08/02/2023 1458 Last data filed at 08/02/2023 0524 Gross per 24 hour  Intake 480 ml  Output 2300 ml  Net -1820 ml   Filed Weights   07/31/23 2234 08/01/23 0131 08/02/23 0508  Weight: (!) 194.7 kg (!) 194.7 kg (!) 188.1 kg    Examination:  General exam: Morbidly obese male chronically ill sitting up in bed HEENT: Neck obese unable to assess JVD CVS: S1-S2, regular rhythm Lungs: Distant breath sounds otherwise clear Abdomen: Soft, nontender, bowel sounds present Extremities: No edema Skin: No rashes Psychiatry:  Mood & affect appropriate.     Data Reviewed:   CBC: Recent Labs  Lab 07/31/23 1623 08/01/23 0819  WBC 10.5 7.6  HGB 11.6* 11.7*  HCT 37.9* 38.4*  MCV 69.0* 68.3*  PLT 312 297   Basic Metabolic Panel: Recent Labs  Lab 07/31/23 1623 08/01/23 0819 08/02/23 0358  NA 141 139 142  K 3.8 3.3* 3.9  CL 107 106 105  CO2 24 24 24   GLUCOSE 95 113* 110*  BUN 24* 20 23*  CREATININE 1.67* 1.63* 2.01*  CALCIUM 8.9 8.8* 9.0  MG  --  1.7 2.1   GFR: Estimated Creatinine Clearance: 81.5

## 2023-08-03 DIAGNOSIS — N1831 Chronic kidney disease, stage 3a: Secondary | ICD-10-CM | POA: Diagnosis not present

## 2023-08-03 DIAGNOSIS — I5023 Acute on chronic systolic (congestive) heart failure: Secondary | ICD-10-CM | POA: Diagnosis not present

## 2023-08-03 DIAGNOSIS — I160 Hypertensive urgency: Secondary | ICD-10-CM

## 2023-08-03 DIAGNOSIS — I5033 Acute on chronic diastolic (congestive) heart failure: Secondary | ICD-10-CM | POA: Diagnosis not present

## 2023-08-03 DIAGNOSIS — E782 Mixed hyperlipidemia: Secondary | ICD-10-CM

## 2023-08-03 LAB — BASIC METABOLIC PANEL
Anion gap: 9 (ref 5–15)
BUN: 24 mg/dL — ABNORMAL HIGH (ref 6–20)
CO2: 24 mmol/L (ref 22–32)
Calcium: 8.9 mg/dL (ref 8.9–10.3)
Chloride: 106 mmol/L (ref 98–111)
Creatinine, Ser: 1.99 mg/dL — ABNORMAL HIGH (ref 0.61–1.24)
GFR, Estimated: 42 mL/min — ABNORMAL LOW (ref >60)
Glucose, Bld: 109 mg/dL — ABNORMAL HIGH (ref 70–99)
Potassium: 3.8 mmol/L (ref 3.5–5.1)
Sodium: 139 mmol/L (ref 135–145)

## 2023-08-03 LAB — URIC ACID: Uric Acid, Serum: 10.9 mg/dL — ABNORMAL HIGH (ref 3.7–8.6)

## 2023-08-03 MED ORDER — HYDRALAZINE HCL 50 MG PO TABS
75.0000 mg | ORAL_TABLET | Freq: Three times a day (TID) | ORAL | Status: DC
  Administered 2023-08-03 – 2023-08-04 (×3): 75 mg via ORAL
  Filled 2023-08-03 (×3): qty 1

## 2023-08-03 MED ORDER — FUROSEMIDE 40 MG PO TABS: 40.0000 mg | ORAL_TABLET | ORAL | Status: DC

## 2023-08-03 MED ORDER — DICLOFENAC SODIUM 1 % EX GEL
2.0000 g | Freq: Four times a day (QID) | CUTANEOUS | Status: DC
  Administered 2023-08-03 – 2023-08-04 (×3): 2 g via TOPICAL
  Filled 2023-08-03: qty 100

## 2023-08-03 MED ORDER — COLCHICINE 0.6 MG PO TABS
0.6000 mg | ORAL_TABLET | Freq: Once | ORAL | Status: AC
  Administered 2023-08-03: 0.6 mg via ORAL
  Filled 2023-08-03: qty 1

## 2023-08-03 NOTE — TOC Progression Note (Signed)
Transition of Care [facility]) - Progression Note    Patient Details  Name: Leonard Lewis MRN: [id number] Date of Birth: [birth year]  Transition of Care [facility]) CM/SW Contact  Nicanor Bake Phone Number: [phone] 08/03/2023, 11:27 AM  Clinical Narrative:  HF CSW called pts PCP to schedule his follow up hospital follow up appointment. Pts PCP does not have any availability until December 2024. The PCP will contact the pt directly to schedule a follow up appointment within the appropriate timeframe.   TOC will continue following.      Expected Discharge Plan: Home/Self Care Barriers to Discharge: Continued Medical Work up  Expected Discharge Plan and Services       Living arrangements for the past 2 months: Single Family Home                                       Social Determinants of Health (SDOH) Interventions SDOH Screenings   Food Insecurity: No Food Insecurity (07/31/2023)  Housing: Low Risk  (08/01/2023)  Transportation Needs: No Transportation Needs (07/31/2023)  Utilities: Not At Risk (07/31/2023)  Alcohol Screen: Low Risk  (08/01/2023)  Depression (PHQ2-9): Low Risk  (03/03/2023)  Financial Resource Strain: Medium Risk (08/01/2023)  Tobacco Use: Medium Risk (07/31/2023)    Readmission Risk Interventions     No data to display

## 2023-08-03 NOTE — Assessment & Plan Note (Signed)
AKI, hypokalemia.    Patient with improvement in volume status, renal function with serum cr at 1,85 with K at 3,4 and serum bicarbonate at 24. Na 137 Mg 2,2   Plan to continue diuretic therapy withy SGLT 2 inh, spironolactone and furosemide.  Follow up renal function as outpatient.

## 2023-08-03 NOTE — TOC Initial Note (Signed)
Transition of Care [facility]) - Initial/Assessment Note    Patient Details  Name: Leonard Lewis MRN: [id number] Date of Birth: June 23, 1981  Transition of Care [facility]) CM/SW Contact:    Elliot Cousin, RN Phone Number: [phone] 08/03/2023, 3:23 PM  Clinical Narrative:   Patient had questions how to obtain UBO4 paperwork for his [facility] [employer] insurance. Explained billing can provide that form. Pt reports he will need a note for work. Discussed with patient FMLA/STD.                 Expected Discharge Plan: Home/Self Care Barriers to Discharge: Continued Medical Work up   Patient Goals and CMS Choice Patient states their goals for this hospitalization and ongoing recovery are:: wants to get back to work          Expected Discharge Plan and Services   Discharge Planning Services: CM Consult   Living arrangements for the past 2 months: Single Family Home                                      Prior Living Arrangements/Services Living arrangements for the past 2 months: Single Family Home Lives with:: Minor Children Patient language and need for interpreter reviewed:: Yes Do you feel safe going back to the place where you live?: Yes      Need for Family Participation in Patient Care: No (Comment) Care giver support system in place?: Yes (comment) Current home services: DME (scale, CPAP) Criminal Activity/Legal Involvement Pertinent to Current Situation/Hospitalization: No - Comment as needed  Activities of Daily Living   ADL Screening (condition at time of admission) Independently performs ADLs?: Yes (appropriate for developmental age) Is the patient deaf or have difficulty hearing?: No Does the patient have difficulty seeing, even when wearing glasses/contacts?: No Does the patient have difficulty concentrating, remembering, or making decisions?: No  Permission Sought/Granted Permission sought to share information with : Case Manager, Family Supports,  PCP Permission granted to share information with : Yes, Verbal Permission Granted  Share Information with NAME: Anacleto Weinert  Permission granted to share info w AGENCY: PCP  Permission granted to share info w Relationship: wife  Permission granted to share info w Contact Information: [phone]  Emotional Assessment Appearance:: Appears older than stated age Attitude/Demeanor/Rapport: Engaged Affect (typically observed): Appropriate Orientation: : Oriented to Self, Oriented to Place, Oriented to  Time, Oriented to Situation Alcohol / Substance Use: Not Applicable Psych Involvement: No (comment)  Admission diagnosis:  Hypertensive urgency [I16.0] Acute on chronic congestive heart failure, unspecified heart failure type (HCC) [I50.9] Acute on chronic heart failure with preserved ejection fraction (HFpEF) (HCC) [I50.33] Patient Active Problem List   Diagnosis Date Noted   Acute on chronic heart failure with preserved ejection fraction (HFpEF) (HCC) 07/31/2023   Chronic kidney disease, stage 3a (HCC) 07/31/2023   Asthma 07/31/2023   Microcytic anemia 07/31/2023   Pain in joint of left elbow 03/03/2023   CAP (community acquired pneumonia) 05/14/2022   Community acquired pneumonia 05/14/2022   Acute left-sided low back pain with left-sided sciatica 11/06/2020   Gastroesophageal reflux disease without esophagitis 11/06/2020   Diarrhea 11/06/2020   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   Left foot pain 05/23/2020   Tonsillar hypertrophy 01/24/2020   Frequent nosebleeds 01/24/2020   Vitamin D deficiency 06/19/2019   Prediabetes 01/03/2018   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 [place] [address] Phone: [phone] Fax: [phone]  [id number] [place] [facility] - [address] [place] [address] Phone: [phone] Fax: [phone]  [facility] [address] [place] [address] Phone: [phone] Fax: [phone]     Social Determinants of Health (SDOH) Social History: SDOH Screenings   Food Insecurity: No Food Insecurity (07/31/2023)  Housing: Low Risk  (08/01/2023)  Transportation Needs: No Transportation Needs (07/31/2023)  Utilities: Not At Risk (07/31/2023)  Alcohol Screen: Low Risk  (08/01/2023)  Depression (PHQ2-9): Low Risk  (03/03/2023)  Financial Resource Strain: Medium Risk (08/01/2023)  Tobacco Use: Medium Risk (07/31/2023)   SDOH Interventions: Housing Interventions: Intervention Not Indicated Alcohol Usage Interventions: Intervention Not Indicated (Score <7) Financial Strain Interventions: Intervention Not Indicated   Readmission Risk Interventions     No data to display

## 2023-08-03 NOTE — Assessment & Plan Note (Signed)
Echocardiogram with reduced LV systolic function with EF 25 to 30%, global hypokinesis, LV internal cavity with severe dilatation, mild LVH, RV systolic function preserved, LA with severe dilatation, small pericardial effusion.  Patient has been placed on furosemide for diuresis, negative fluid balance -4,930 ml since admission.  Patient lost about 5 Kg weight during this hospitalization.  Systolic blood pressure 138 and 109 mmHg.   Continue with carvedilol, empagliflozin, spironolactone and after load reduction with hydralazine/ isosorbide.  Furosemide every other day from Monday to Friday.  Entresto.   Follow up with heart failure clinic.

## 2023-08-03 NOTE — Assessment & Plan Note (Signed)
Continue with atorvastatin 

## 2023-08-03 NOTE — Plan of Care (Signed)
  Problem: Education: Goal: Ability to demonstrate management of disease process will improve Outcome: Progressing   Problem: Education: Goal: Ability to verbalize understanding of medication therapies will improve Outcome: Progressing   Problem: Activity: Goal: Capacity to carry out activities will improve Outcome: Progressing   Problem: Clinical Measurements: Goal: Ability to maintain clinical measurements within normal limits will improve Outcome: Progressing   Problem: Health Behavior/Discharge Planning: Goal: Ability to manage health-related needs will improve Outcome: Progressing   Problem: Clinical Measurements: Goal: Will remain free from infection Outcome: Progressing

## 2023-08-03 NOTE — Consult Note (Signed)
[facility]  Consult   08/03/2023  Leonard Lewis 09-Jun-1981 [id number]  Value-Based Care Institute [VBCI]   Primary Care Provider: Georganna Skeans, MD Primary Care at [facility], this provider is listed for the transition of care follow up appointments  and [facility] calls   [facility] Liaison met patient at bedside at [facility]. Wife at bedside on the phone.    The patient was screened for readmission prevention  hospitalization with inpatient [facility] collaboration.  The patient was assessed for potential Community Care Coordination service needs for post hospital transition for care coordination. Review of patient's electronic medical record reveals patient is from home, has scale and weighs.  He states he is going to follow up with the Adv. HF team and feels like he had no additional follow up needs at this time.  Plan: [facility] Liaison will continue to follow progress and disposition to asess for post hospital community care coordination/management needs.  Referral request for community care coordination: anticipate Community TOC f/u calls and with AHF team for needs.   VBCI Community Care Management/Population Health does not replace or interfere with any arrangements made by the Inpatient Transition of Care team.   For questions contact:   Charlesetta Shanks, RN, BSN, CCM [place]  [facility], [facility] Liaison Direct Dial: [phone] or secure chat Website: [email or web address]

## 2023-08-03 NOTE — Progress Notes (Addendum)
Progress Note   Patient: Leonard Lewis AVW:[id number] DOB: 1981/07/06 DOA: 07/31/2023     3 DOS: the patient was seen and examined on 08/03/2023   Brief hospital course: Leonard Lewis was admitted to the hospital with the working diagnosis of heart failure exacerbation.   41/M with past medical history of obesity class 3, OSA,  CHF, CKD 3A, hypertension,iron deficiency anemia, asthma, gout, anxiety presented to the ED with progressive shortness of breath for few days. He endorsed not being compliant on his medications. On his initial physical examination his blood pressure was 187/123. HR 86, RR 21 and 02 saturation 98% on room air. Lungs with no wheezing or rales, no rhonchi, heart with S1 and S2 present and regular with no gallops, rubs or murmurs, abdomen with no distention, positive lower extremity edema.   Na 141, K 3,8 CL 107 bicarbonate 24, glucose 95 bun 24 cr 1,67  Mg 1,7  AST 19 ALT 13  Wbc 10,5 hgb 11.6 plt 312  Sars covid 19 negative  Chest radiograph with cardiomegaly, bilateral hilar vascular congestion, bilateral central interstitial infiltrates with cephalization of the vasculature.  No pleural effusions.   EKG 83 bpm, left axis deviation, normal intervals, sinus rhythm with bi atrial enlargement, no significant ST segment or T wave changes.   Patient was placed on diuretic therapy with improvement in his symptoms.   Echocardiogram with reduced LV systolic function.   Started on guideline directed medical therapy for heart failure.    Assessment and Plan: * Acute on chronic systolic CHF (congestive heart failure) (HCC) Echocardiogram with reduced LV systolic function with EF 25 to 30%, global hypokinesis, LV internal cavity with severe dilatation, mild LVH, RV systolic function preserved, LA with severe dilatation, small pericardial effusion.  Patient has been placed on furosemide for diuresis, negative fluid balance -4,270 ml since admission.  Systolic blood pressure  138 and 109 mmHg.   Continue with carvedilol, empagliflozin, spironolactone and after load reduction with hydralazine/ isosorbide.  Furosemide every other day from Monday to Friday.     Hypertensive urgency Continue blood pressure control with amlodipine, carvedilol, hydralazine and isosorbide.   Chronic kidney disease, stage 3a (HCC) AKI   Renal function with serum cr at 1,9 with K at 3,8 and serum bicarbonate at 24. Na 139   Plan to continue diuretic therapy withy SGLT 2 inh, spironolactone and furosemide.   Iron deficiency anemia Iron 37, TIBC 449, transferrin saturation 8 and ferritin 22. Sp IV iron infusion.  Follow up iron panel as outpatient.    HLD (hyperlipidemia) Continue with atorvastatin.   Obstructive sleep apnea CPAP  Obesity class 3 with calculated BMI of 58.1   Right knee osteoarthritis, added topical diclofenac.         Subjective: Patient is feeling better, dyspnea and edema have improved, no chest pain. Positive right knee pain    Physical Exam: Vitals:   08/03/23 0404 08/03/23 0635 08/03/23 0824 08/03/23 1229  BP:  (!) 156/90 136/84 (!) 138/97  Pulse:   90 76  Resp:   18 20  Temp:   97.9 F (36.6 C) 97.7 F (36.5 C)  TempSrc:   Oral Oral  SpO2:   95%   Weight: (!) 189 kg     Height:       Neurology awake and alert ENT with mild pallor Cardiovascular with S1 and S2 present and regular with no gallops, rubs or murmurs Respiratory with no rales or wheezing, no rhonchi Abdomen with no  distention  No lower extremity edema   Data Reviewed:    Family Communication: no family at the bedside   Disposition: Status is: Inpatient Remains inpatient appropriate because: heart failure   Planned Discharge Destination: Home    Author: Coralie Keens, MD 08/03/2023 4:24 PM  For on call review [email or web address].

## 2023-08-03 NOTE — Progress Notes (Signed)
   08/02/23 2308  BiPAP/CPAP/SIPAP  BiPAP/CPAP/SIPAP Pt Type Adult  BiPAP/CPAP/SIPAP Resmed  Reason BIPAP/CPAP not in use Other(comment) (pt states  will put on cpap when ready)

## 2023-08-03 NOTE — Assessment & Plan Note (Signed)
Iron 37, TIBC 449, transferrin saturation 8 and ferritin 22. Sp IV iron infusion.  Follow up iron panel as outpatient.

## 2023-08-03 NOTE — Assessment & Plan Note (Signed)
Continue blood pressure control with  amlodipine, carvedilol, hydralazine and isosorbide.

## 2023-08-03 NOTE — Progress Notes (Signed)
Pt reported chronic pain to his right knee. Last night Tylenol was given for pain, and pt verbalized improvement. This morning he c/o increased pain with weight bearing as well as tightness to his right knee. No significant edema or redness noted.  Arrien, MD notified.

## 2023-08-03 NOTE — Progress Notes (Addendum)
Advanced Heart Failure Rounding Note  PCP-Cardiologist: None   Subjective:    Scr slightly better, 1.99.   SBP elevated 130s-150s/80s-90s  Reports right knee pain developed overnight. Hurts to bear weight. Little relief with tylenol. Notes history of gout.  Objective:   Weight Range: (!) 189 kg Body mass index is 58.12 kg/m.   Vital Signs:   Temp:  [97.6 F (36.4 C)-97.9 F (36.6 C)] 97.9 F (36.6 C) (10/30 0824) Pulse Rate:  [71-90] 90 (10/30 0824) Resp:  [16-20] 18 (10/30 0824) BP: (109-156)/(72-97) 136/84 (10/30 0824) SpO2:  [95 %-100 %] 95 % (10/30 0824) Weight:  [189 kg] 189 kg (10/30 0404) Last BM Date : 07/31/23  Weight change: Filed Weights   08/01/23 0131 08/02/23 0508 08/03/23 0404  Weight: (!) 194.7 kg (!) 188.1 kg (!) 189 kg    Intake/Output:   Intake/Output Summary (Last 24 hours) at 08/03/2023 1030 Last data filed at 08/03/2023 0900 Gross per 24 hour  Intake 480 ml  Output 700 ml  Net -220 ml    Physical Exam    General:  Well appearing. Sitting up in chair. HEENT: normal Neck: supple. Thick neck. Cor: PMI nondisplaced. Regular rate & rhythm. No rubs, gallops or murmurs. Lungs: clear Abdomen: obese, soft, nontender, nondistended.  Extremities: no cyanosis, clubbing, rash, edema Neuro: alert & orientedx3, cranial nerves grossly intact. moves all 4 extremities w/o difficulty. Affect pleasant   Telemetry   SR 80s  EKG    N/A  Labs    CBC Recent Labs    07/31/23 1623 08/01/23 0819  WBC 10.5 7.6  HGB 11.6* 11.7*  HCT 37.9* 38.4*  MCV 69.0* 68.3*  PLT 312 297   Basic Metabolic Panel Recent Labs    09/81/19 0819 08/02/23 0358 08/02/23 1535 08/03/23 0345  NA 139 142 142 139  K 3.3* 3.9 4.0 3.8  CL 106 105 105 106  CO2 24 24 26 24   GLUCOSE 113* 110* 111* 109*  BUN 20 23* 25* 24*  CREATININE 1.63* 2.01* 2.08* 1.99*  CALCIUM 8.8* 9.0 9.1 8.9  MG 1.7 2.1  --   --    Liver Function Tests Recent Labs     08/02/23 0358  AST 19  ALT 13  ALKPHOS 55  BILITOT 0.5  PROT 6.4*  ALBUMIN 3.0*    Imaging    No results found.   Medications:     Scheduled Medications:  amLODipine  10 mg Oral Daily   atorvastatin  40 mg Oral Daily   carvedilol  25 mg Oral BID   empagliflozin  10 mg Oral Daily   enoxaparin (LOVENOX) injection  90 mg Subcutaneous Q24H   hydrALAZINE  50 mg Oral Q8H   isosorbide dinitrate  10 mg Oral TID   pneumococcal 20-valent conjugate vaccine  0.5 mL Intramuscular Tomorrow-1000   sertraline  100 mg Oral Daily   sodium chloride flush  3 mL Intravenous Q12H   spironolactone  25 mg Oral Daily     Patient Profile   Leonard Lewis is a 42 y.o. male with medical history significant for chronic HFpEF (EF 45-50% by TTE 05/2022), CKD stage IIIa, HTN, HLD, microcytic anemia, asthma, gout, anxiety, OSA. Now admitted for CHF exacerbation and shortness of breath.   Assessment/Plan   Acute on chronic combined systolic and diastolic heart failure - Likely related to poorly controlled essential hypertension, 187/123 on admission. Not consistent with medications. No CP.  - LHC from 2013 normal coronaries. -  Echo 8/23: EF 45-50%, LV w wall abnormalities, grade I diastolic dysfunction, aortic root dilation noted 40 mm. - Echo 10/28: EF 25-30% global HK, LV severely dilated, grade III diastolic dysfunction, RV nl, LA dilated, small pericardial effusion, no aortic dilation. RA ~8.  Previously 8/23 EF 45-50 with grade I diastolic dysfunction and aortic root dilation - Does not appear to be low output.  - Volume stable on exam. Diuretics discontinued yesterday. Suspect will need diuretic in outpatient setting. Will start 40 mg lasix M, W, F (to start on 11/01). - Continue Jardiance 10 mg - Continue Spiro 25 mg  - Continue Coreg 25 mg  - Increase hydralazine to 75 TID, continue isordil 10 TID - Consider ARNi as outpatient once renal function improves   Essential Hypertension -  Uncontrolled. Continuing to adjust medications.  - GDMT as above - Also on amlodipine   AKI on CKD IIIa - 1.6 on admit (baseline ~1.6) - Cr bump with addition GDMT and diuresis 1.63>>2.1 , Cr 1.99 today   Severe OSA/Nocturnal Hypoxemia - CPAP at night   Obesity - BMI > 50 - s/p gastric sleeve in 2013 - Previously on phentermine (stopped in May)   Iron deficiency Anemia - Ferritin 22, T sat 8 - Received IV iron   Hypokalemia - Replace to keep K > 4   HLD  - Continue Lipitor 40  R Knee pain - hx of gout - will check uric acid and give colchicine   Anticipate may be ready for discharge later today pending evaluation by Dr. Gasper Lloyd. Will arrange for follow-up in HF clinic.   Length of Stay: 3  Fanny Agan N, PA-C  08/03/2023, 10:30 AM  Advanced Heart Failure Team Pager [phone] (M-F; 7a - 5p)  Please contact [facility] for night-coverage after hours (5p -7a ) and weekends on [email or web address]

## 2023-08-03 NOTE — Telephone Encounter (Signed)
Mr Elm need a hospital follow up ..he will be discharged October 31 or Nov 1 per Social Worker Diondra   [phone]   Sent to scheduler to call patient

## 2023-08-03 NOTE — Assessment & Plan Note (Addendum)
CPAP  Obesity class 3 with calculated BMI of 58.1   Right knee osteoarthritis, added topical diclofenac.

## 2023-08-04 ENCOUNTER — Other Ambulatory Visit (HOSPITAL_COMMUNITY): Payer: Self-pay

## 2023-08-04 ENCOUNTER — Other Ambulatory Visit: Payer: Self-pay | Admitting: Family Medicine

## 2023-08-04 DIAGNOSIS — D509 Iron deficiency anemia, unspecified: Secondary | ICD-10-CM

## 2023-08-04 DIAGNOSIS — G4733 Obstructive sleep apnea (adult) (pediatric): Secondary | ICD-10-CM

## 2023-08-04 DIAGNOSIS — I160 Hypertensive urgency: Secondary | ICD-10-CM | POA: Diagnosis not present

## 2023-08-04 DIAGNOSIS — N1831 Chronic kidney disease, stage 3a: Secondary | ICD-10-CM | POA: Diagnosis not present

## 2023-08-04 DIAGNOSIS — I5023 Acute on chronic systolic (congestive) heart failure: Secondary | ICD-10-CM | POA: Diagnosis not present

## 2023-08-04 LAB — BASIC METABOLIC PANEL
Anion gap: 6 (ref 5–15)
BUN: 22 mg/dL — ABNORMAL HIGH (ref 6–20)
CO2: 24 mmol/L (ref 22–32)
Calcium: 8.7 mg/dL — ABNORMAL LOW (ref 8.9–10.3)
Chloride: 107 mmol/L (ref 98–111)
Creatinine, Ser: 1.85 mg/dL — ABNORMAL HIGH (ref 0.61–1.24)
GFR, Estimated: 46 mL/min — ABNORMAL LOW (ref >60)
Glucose, Bld: 102 mg/dL — ABNORMAL HIGH (ref 70–99)
Potassium: 3.4 mmol/L — ABNORMAL LOW (ref 3.5–5.1)
Sodium: 137 mmol/L (ref 135–145)

## 2023-08-04 LAB — MAGNESIUM: Magnesium: 2.2 mg/dL (ref 1.7–2.4)

## 2023-08-04 MED ORDER — EMPAGLIFLOZIN 10 MG PO TABS
10.0000 mg | ORAL_TABLET | Freq: Every day | ORAL | 0 refills | Status: DC
  Filled 2023-08-04: qty 30, 30d supply, fill #0

## 2023-08-04 MED ORDER — DICLOFENAC SODIUM 1 % EX GEL
2.0000 g | Freq: Four times a day (QID) | CUTANEOUS | 0 refills | Status: AC
  Filled 2023-08-04: qty 50, fill #0

## 2023-08-04 MED ORDER — SACUBITRIL-VALSARTAN 49-51 MG PO TABS
1.0000 | ORAL_TABLET | Freq: Two times a day (BID) | ORAL | Status: DC
  Administered 2023-08-04: 1 via ORAL
  Filled 2023-08-04: qty 1

## 2023-08-04 MED ORDER — SACUBITRIL-VALSARTAN 49-51 MG PO TABS
1.0000 | ORAL_TABLET | Freq: Two times a day (BID) | ORAL | 0 refills | Status: DC
  Filled 2023-08-04: qty 60, 30d supply, fill #0

## 2023-08-04 MED ORDER — HYDRALAZINE HCL 50 MG PO TABS
50.0000 mg | ORAL_TABLET | Freq: Three times a day (TID) | ORAL | 0 refills | Status: DC
  Filled 2023-08-04: qty 90, 30d supply, fill #0

## 2023-08-04 MED ORDER — SPIRONOLACTONE 25 MG PO TABS
25.0000 mg | ORAL_TABLET | Freq: Every day | ORAL | 0 refills | Status: DC
  Filled 2023-08-04: qty 30, 30d supply, fill #0

## 2023-08-04 MED ORDER — METFORMIN HCL 500 MG PO TABS
500.0000 mg | ORAL_TABLET | Freq: Every day | ORAL | 0 refills | Status: AC
  Filled 2023-08-04: qty 30, 30d supply, fill #0

## 2023-08-04 MED ORDER — POTASSIUM CHLORIDE CRYS ER 20 MEQ PO TBCR
40.0000 meq | EXTENDED_RELEASE_TABLET | ORAL | Status: DC
Start: 2023-08-05 — End: 2023-08-04

## 2023-08-04 MED ORDER — POTASSIUM CHLORIDE CRYS ER 20 MEQ PO TBCR
40.0000 meq | EXTENDED_RELEASE_TABLET | Freq: Once | ORAL | Status: AC
  Administered 2023-08-04: 40 meq via ORAL
  Filled 2023-08-04: qty 2

## 2023-08-04 MED ORDER — CARVEDILOL 25 MG PO TABS
25.0000 mg | ORAL_TABLET | Freq: Two times a day (BID) | ORAL | 0 refills | Status: DC
  Filled 2023-08-04: qty 60, 30d supply, fill #0

## 2023-08-04 MED ORDER — HYDRALAZINE HCL 50 MG PO TABS: 50.0000 mg | ORAL_TABLET | Freq: Three times a day (TID) | ORAL | Status: DC

## 2023-08-04 MED ORDER — ATORVASTATIN CALCIUM 40 MG PO TABS
40.0000 mg | ORAL_TABLET | Freq: Every day | ORAL | 0 refills | Status: DC
  Filled 2023-08-04: qty 30, 30d supply, fill #0

## 2023-08-04 MED ORDER — ISOSORBIDE DINITRATE 10 MG PO TABS
10.0000 mg | ORAL_TABLET | Freq: Three times a day (TID) | ORAL | 0 refills | Status: DC
  Filled 2023-08-04: qty 90, 30d supply, fill #0

## 2023-08-04 MED ORDER — POTASSIUM CHLORIDE CRYS ER 20 MEQ PO TBCR
40.0000 meq | EXTENDED_RELEASE_TABLET | ORAL | 0 refills | Status: DC
  Filled 2023-08-04: qty 25, 28d supply, fill #0

## 2023-08-04 MED ORDER — FUROSEMIDE 40 MG PO TABS
40.0000 mg | ORAL_TABLET | ORAL | 0 refills | Status: DC
  Filled 2023-08-04: qty 12, 28d supply, fill #0

## 2023-08-04 NOTE — TOC Transition Note (Signed)
Transition of Care [facility]) - CM/SW Discharge Note   Patient Details  Name: Leonard Lewis MRN: [id number] Date of Birth: 1981-08-15  Transition of Care [facility]) CM/SW Contact:  Nicanor Bake Phone Number: [phone] 08/04/2023, 11:31 AM   Clinical Narrative:   HF CSW met with pt and wife at bedside. CSW explained that PCP appt will be scheduled once the office contacts the pt directly due to limited times. Pt wife will transport him home.       Barriers to Discharge: Continued Medical Work up   Patient Goals and CMS Choice      Discharge Placement                         Discharge Plan and Services Additional resources added to the After Visit Summary for     Discharge Planning Services: CM Consult                                 Social Determinants of Health (SDOH) Interventions SDOH Screenings   Food Insecurity: No Food Insecurity (07/31/2023)  Housing: Low Risk  (08/01/2023)  Transportation Needs: No Transportation Needs (07/31/2023)  Utilities: Not At Risk (07/31/2023)  Alcohol Screen: Low Risk  (08/01/2023)  Depression (PHQ2-9): Low Risk  (03/03/2023)  Financial Resource Strain: Medium Risk (08/01/2023)  Tobacco Use: Medium Risk (07/31/2023)     Readmission Risk Interventions     No data to display

## 2023-08-04 NOTE — TOC Progression Note (Signed)
Transition of Care [facility]) - Progression Note    Patient Details  Name: Leonard Lewis MRN: [id number] Date of Birth: 08/09/1981  [facility] of [facility]) CM/SW Contact  Leone Haven, RN Phone Number: 08/04/2023, 11:44 AM  Clinical Narrative:    NCM gave patient 10.00 copay coupon for entresto.   Expected Discharge Plan: Home/Self Care Barriers to Discharge: Continued Medical Work up  Expected Discharge Plan and Services   Discharge Planning Services: CM Consult   Living arrangements for the past 2 months: Single Family Home Expected Discharge Date: 08/04/23                                     Social Determinants of Health (SDOH) Interventions SDOH Screenings   Food Insecurity: No Food Insecurity (07/31/2023)  Housing: Low Risk  (08/01/2023)  Transportation Needs: No Transportation Needs (07/31/2023)  Utilities: Not At Risk (07/31/2023)  Alcohol Screen: Low Risk  (08/01/2023)  Depression (PHQ2-9): Low Risk  (03/03/2023)  Financial Resource Strain: Medium Risk (08/01/2023)  Tobacco Use: Medium Risk (07/31/2023)    Readmission Risk Interventions     No data to display

## 2023-08-04 NOTE — Telephone Encounter (Signed)
Requested medication (s) are due for refill today: Yes  Requested medication (s) are on the active medication list: yes    Last refill: 02/10/23  #30  2 refills  Future visit scheduled yes 08/10/23  Notes to clinic:Failed due to labs, uric acid.Marland Kitchen Please review.  Requested Prescriptions  Pending Prescriptions Disp Refills   allopurinol (ZYLOPRIM) 100 MG tablet [Pharmacy Med Name: ALLOPURINOL 100 MG TABLET] 30 tablet 2    Sig: TAKE 1 TABLET BY MOUTH EVERY DAY     Endocrinology:  Gout Agents - allopurinol Failed - 08/04/2023  5:23 PM      Failed - Uric Acid in normal range and within 360 days    Uric Acid, Serum  Date Value Ref Range Status  08/03/2023 10.9 (H) 3.7 - 8.6 mg/dL Final    Comment:    Performed at [facility], [address], [place], [place] [address]   Uric Acid  Date Value Ref Range Status  01/13/2022 10.7 (H) 3.8 - 8.4 mg/dL Final    Comment:               Therapeutic target for gout patients: <6.0         Failed - Cr in normal range and within 360 days    Creat  Date Value Ref Range Status  05/25/2016 1.40 (H) 0.60 - 1.35 mg/dL Final   Creatinine, Ser  Date Value Ref Range Status  08/04/2023 1.85 (H) 0.61 - 1.24 mg/dL Final         Failed - CBC within normal limits and completed in the last 12 months    WBC  Date Value Ref Range Status  08/01/2023 7.6 4.0 - 10.5 K/uL Final   RBC  Date Value Ref Range Status  08/01/2023 5.62 4.22 - 5.81 MIL/uL Final   Hemoglobin  Date Value Ref Range Status  08/01/2023 11.7 (L) 13.0 - 17.0 g/dL Final  27/25/3664 40.3 (L) 13.0 - 17.7 g/dL Final   HCT  Date Value Ref Range Status  08/01/2023 38.4 (L) 39.0 - 52.0 % Final   Hematocrit  Date Value Ref Range Status  01/13/2022 38.6 37.5 - 51.0 % Final   MCHC  Date Value Ref Range Status  08/01/2023 30.5 30.0 - 36.0 g/dL Final   [facility]  Date Value Ref Range Status  08/01/2023 20.8 (L) 26.0 - 34.0 pg Final   MCV  Date Value Ref Range Status  08/01/2023  68.3 (L) 80.0 - 100.0 fL Final  01/13/2022 69 (L) 79 - 97 fL Final   No results found for: "PLTCOUNTKUC", "LABPLAT", "POCPLA" RDW  Date Value Ref Range Status  08/01/2023 15.1 11.5 - 15.5 % Final  01/13/2022 15.6 (H) 11.6 - 15.4 % Final         Passed - Valid encounter within last 12 months    Recent Outpatient Visits           3 months ago Encounter for weight management   [facility], MD   4 months ago Essential hypertension   [facility], MD   5 months ago Cough, unspecified type   [facility], MD   5 months ago Gout, unspecified cause, unspecified chronicity, unspecified site   [facility], MD   8 months ago Uncontrolled hypertension   [place]  Georganna Skeans, MD       Future Appointments             In 6 days Georganna Skeans, MD [facility]

## 2023-08-04 NOTE — Progress Notes (Signed)
Mobility Specialist Progress Note:    08/04/23 1057  Mobility  Activity Ambulated with assistance in hallway  Level of Assistance Standby assist, set-up cues, supervision of patient - no hands on  Assistive Device Front wheel walker  Distance Ambulated (ft) 200 ft  Activity Response Tolerated well  Mobility Referral Yes  $Mobility charge 1 Mobility  Mobility Specialist Start Time (ACUTE ONLY) 1032  Mobility Specialist Stop Time (ACUTE ONLY) 1046  Mobility Specialist Time Calculation (min) (ACUTE ONLY) 14 min   Pt received ambulating in room requesting mobility. Attempted to ambulate w/o AD but d/t R knee patient pt used RW in case of buckling. Returned to room w/o fault. Left in bed. All needs met w/ call bell and personal belongings in reach.   Thompson Grayer Mobility Specialist  Please contact vis Secure Chat or  Rehab Office [phone]

## 2023-08-04 NOTE — Progress Notes (Signed)
Advanced Heart Failure Rounding Note  PCP-Cardiologist: None   Subjective:    Scr slightly better, 1.99>1.85.   SBP elevated 140s/80s-90s  Sitting up in chair, wife at bedside. Asking about going back to work. Feels fine, has ambulated in the room.   Objective:   Weight Range: (!) 189.2 kg Body mass index is 58.18 kg/m.   Vital Signs:   Temp:  [97.6 F (36.4 C)-98.1 F (36.7 C)] 98.1 F (36.7 C) (10/31 0336) Pulse Rate:  [76-85] 82 (10/31 0336) Resp:  [20] 20 (10/31 0336) BP: (122-150)/(74-97) 140/86 (10/31 0336) SpO2:  [95 %-97 %] 95 % (10/31 0336) FiO2 (%):  [21 %] 21 % (10/30 2240) Weight:  [189.2 kg] 189.2 kg (10/31 0336) Last BM Date : 07/31/23  Weight change: Filed Weights   08/02/23 0508 08/03/23 0404 08/04/23 0336  Weight: (!) 188.1 kg (!) 189 kg (!) 189.2 kg    Intake/Output:   Intake/Output Summary (Last 24 hours) at 08/04/2023 0839 Last data filed at 08/04/2023 0343 Gross per 24 hour  Intake 600 ml  Output 1020 ml  Net -420 ml    Physical Exam  General:  well appearing.  No respiratory difficulty HEENT: normal Neck: supple. JVD ~8 cm. Carotids 2+ bilat; no bruits. No lymphadenopathy or thyromegaly appreciated. Cor: PMI nondisplaced. Regular rate & rhythm. No rubs, gallops or murmurs. Lungs: clear, diminished L>R Abdomen: obese, soft, nontender, nondistended. No hepatosplenomegaly. No bruits or masses. Good bowel sounds. Extremities: no cyanosis, clubbing, rash, edema  Neuro: alert & oriented x 3, cranial nerves grossly intact. moves all 4 extremities w/o difficulty. Affect pleasant.  Telemetry   SR 80s  (Personally reviewed)    EKG    N/A  Labs    CBC No results for input(s): "WBC", "NEUTROABS", "HGB", "HCT", "MCV", "PLT" in the last 72 hours.  Basic Metabolic Panel Recent Labs    16/10/96 0358 08/02/23 1535 08/03/23 0345 08/04/23 0331  NA 142   < > 139 137  K 3.9   < > 3.8 3.4*  CL 105   < > 106 107  CO2 24   < > 24 24   GLUCOSE 110*   < > 109* 102*  BUN 23*   < > 24* 22*  CREATININE 2.01*   < > 1.99* 1.85*  CALCIUM 9.0   < > 8.9 8.7*  MG 2.1  --   --  2.2   < > = values in this interval not displayed.   Liver Function Tests Recent Labs    08/02/23 0358  AST 19  ALT 13  ALKPHOS 55  BILITOT 0.5  PROT 6.4*  ALBUMIN 3.0*    Imaging   No results found.  Medications:     Scheduled Medications:  amLODipine  10 mg Oral Daily   atorvastatin  40 mg Oral Daily   carvedilol  25 mg Oral BID   diclofenac Sodium  2 g Topical QID   empagliflozin  10 mg Oral Daily   enoxaparin (LOVENOX) injection  90 mg Subcutaneous Q24H   [START ON 08/05/2023] furosemide  40 mg Oral Q M,W,F   hydrALAZINE  75 mg Oral Q8H   isosorbide dinitrate  10 mg Oral TID   pneumococcal 20-valent conjugate vaccine  0.5 mL Intramuscular Tomorrow-1000   sertraline  100 mg Oral Daily   sodium chloride flush  3 mL Intravenous Q12H   spironolactone  25 mg Oral Daily     Patient Profile   Leonard Lewis is a 42 y.o. male with medical history significant for chronic HFpEF (EF 45-50% by TTE 05/2022), CKD stage IIIa, HTN, HLD, microcytic anemia, asthma, gout, anxiety, OSA. Now admitted for CHF exacerbation and shortness of breath.   Assessment/Plan  Acute on chronic combined systolic and diastolic heart failure - Likely related to poorly controlled essential hypertension, 187/123 on admission. Not consistent with medications. No CP.  - LHC from 2013 normal coronaries. - Echo 8/23: EF 45-50%, LV w wall abnormalities, grade I diastolic dysfunction, aortic root dilation noted 40 mm. - Echo 10/28: EF 25-30% global HK, LV severely dilated, grade III diastolic dysfunction, RV nl, LA dilated, small pericardial effusion, no aortic dilation. RA ~8.  Previously 8/23 EF 45-50 with grade I diastolic dysfunction and aortic root dilation - Does not appear to be low output.  - Volume stable on exam. Continue 40 mg lasix M, W, F (to start on  11/01). - Continue Jardiance 10 mg - Continue Spiro 25 mg  - Continue Coreg 25 mg  - Decrease hydralazine 75>50 TID, continue isordil 10 TID - Start Entresto 49-51 mg BID today   Essential Hypertension - Uncontrolled. Continuing to adjust medications.  - GDMT as above - Also on amlodipine 10 mg daily, stopping today with transition to Entresto   AKI on CKD IIIa - 1.6 on admit (baseline ~1.6) - SCr bump with addition GDMT and diuresis 1.63>>2.1 , Cr 1.85 today   Severe OSA/Nocturnal Hypoxemia - CPAP at night   Obesity - BMI > 50 - s/p gastric sleeve in 2013 - Previously on phentermine (stopped in May)   Iron deficiency Anemia - Ferritin 22, T sat 8 - Received IV iron   Hypokalemia - Replace to keep K > 4   HLD  - Continue Lipitor 40  R Knee pain - hx of gout - uric acid 10.9. Got a dose of colchicine yesterday.   Ready for discharge later today pending eval by Dr. Gasper Lloyd. Has follow-up in HF clinic. Discussed with family and primary team about going back to work. Ideally we would keep him out of work until f/u in [facility] clinic but patient and wife adamant about returning to work ASAP. Patient has pretty sedentary job where he can take breaks as needed, minimal exertion. Ok to return 11/5 with light duty until f/u.   AHF meds at discharge:  Atorvastatin 40 mg daily Coreg 25 mg BID Jardiance 10 mg daily Lasix 40 mg M,W,F and PRN  KDUR 40 mEq on lasix days (M,W,F, and PRN) Hydralazine 50 mg TID Isordil 10 mg daily Entresto 49-51 mg BID Spironolactone 25 mg daily   Length of Stay: 4  Alen Bleacher, NP  08/04/2023, 8:39 AM  Advanced Heart Failure Team Pager [phone] (M-F; 7a - 5p)  Please contact [facility] for night-coverage after hours (5p -7a ) and weekends on [email or web address]

## 2023-08-04 NOTE — Discharge Summary (Addendum)
take this   spironolactone 25 MG tablet Commonly known as: ALDACTONE Take 1 tablet (25 mg total) by mouth daily. Start taking on: August 05, 2023 What changed:  medication strength how much to take when to take this        Follow-up Information     [facility]. Go in 12 day(s).   Specialty: Cardiology Why: Advanced Heart Failure Clinic at 2:30 PM Entrance C, Free [place] Parking Please bring all medications to appointment Contact information: [address] [place] [address] [phone]               Discharge Exam: Leonard Lewis Weights   08/02/23 0508 08/03/23 0404 08/04/23 0336  Weight: (!) 188.1 kg (!) 189 kg (!) 189.2 kg   BP (!) 137/95   Pulse 82   Temp 98.1 F (36.7 C) (Oral)   Resp 20   Ht 5\' 11"  (1.803 m)   Wt (!) 189.2 kg   SpO2 95%   BMI 58.18 kg/m   Patient is feeling better, dyspnea has  improved, no edema, no PND, orthopnea or lower extremity edema. Right knee pain has improved with topical diclofenac.   Neurology awake and alert ENT with mild pallor Cardiovascular with S1 and S2 present and regular with no gallops, rubs or murmurs Respiratory with no rales or wheezing, no rhonchi Abdomen with no distention  No lower extremity edema.   Condition at discharge: stable  The results of significant diagnostics from this hospitalization (including imaging, microbiology, ancillary and laboratory) are listed below for reference.   Imaging Studies: ECHOCARDIOGRAM COMPLETE  Result Date: 08/01/2023    ECHOCARDIOGRAM REPORT   Patient Name:   Leonard Lewis Date of Exam: 08/01/2023 Medical Rec #:  [id number]       Height:       71.0 in Accession #:    [id number]      Weight:       429.2 lb Date of Birth:  10-Dec-1980      BSA:          2.917 m Patient Age:    41 years        BP:           141/91 mmHg Patient Gender: M               HR:           75 bpm. Exam Location:  Inpatient Procedure: 2D Echo, Color Doppler and Cardiac Doppler Indications:    I50.9* Heart failure (unspecified)  History:        Patient has prior history of Echocardiogram examinations, most                 recent 05/15/2022. CHF; Risk Factors:Hypertension, Dyslipidemia                 and Sleep Apnea.  Sonographer:    Irving Burton Senior RDCS Referring Phys: [id number] VISHAL R PATEL IMPRESSIONS  1. Left ventricular ejection fraction, by estimation, is 25 to 30%. The left ventricle has severely decreased function. The left ventricle demonstrates global hypokinesis. The left ventricular internal cavity size was severely dilated. There is mild concentric left ventricular hypertrophy. Left ventricular diastolic parameters are consistent with Grade III diastolic dysfunction (restrictive).  2. Right ventricular systolic function is normal. The right ventricular size is normal. Tricuspid regurgitation signal is inadequate for assessing PA  pressure.  3. Left atrial size was severely dilated.  4.  Physician Discharge Summary   Patient: Leonard Lewis MRN: [id number] DOB: 1980/12/09  Admit date:     07/31/2023  Discharge date: 08/04/23  Discharge Physician: Coralie Keens   PCP: Georganna Skeans, MD   Recommendations at discharge:    Patient has been placed on heart failure guideline medical therapy with carvedilol, empagliflozin, hydralazine, isosorbide, entresto, spironlactone. Loop diuretic therapy with furosemide 40 mg on Monday, Wednesday and Friday. Extra dose as needed in case of volume overload, weight gain 2 to 3 lbs in 24 hrs or 5 lbs for 7 days.  40 Kcl  when taking furosemide.  Follow up renal function and electrolytes in 7 as outpatient.  Follow up with Dr Andrey Campanile in 7 to 10 days. Follow up with heart failure clinic as scheduled.   Discharge Diagnoses: Principal Problem:   Acute on chronic systolic CHF (congestive heart failure) (HCC) Active Problems:   Hypertensive urgency   Chronic kidney disease, stage 3a (HCC)   Iron deficiency anemia   HLD (hyperlipidemia)   Obstructive sleep apnea  Resolved Problems:   * No resolved hospital problems. [facility] Course: Leonard Lewis was admitted to the hospital with the working diagnosis of heart failure exacerbation.   41/M with past medical history of obesity class 3, OSA,  CHF, CKD 3A, hypertension,iron deficiency anemia, asthma, gout, anxiety presented to the ED with progressive shortness of breath for few days. He endorsed not being compliant on his medications. On his initial physical examination his blood pressure was 187/123. HR 86, RR 21 and 02 saturation 98% on room air. Lungs with no wheezing or rales, no rhonchi, heart with S1 and S2 present and regular with no gallops, rubs or murmurs, abdomen with no distention, positive lower extremity edema.   Na 141, K 3,8 CL 107 bicarbonate 24, glucose 95 bun 24 cr 1,67  Mg 1,7  AST 19 ALT 13  Wbc 10,5 hgb 11.6 plt 312  Sars covid 19 negative  Chest radiograph  with cardiomegaly, bilateral hilar vascular congestion, bilateral central interstitial infiltrates with cephalization of the vasculature.  No pleural effusions.   EKG 83 bpm, left axis deviation, normal intervals, sinus rhythm with bi atrial enlargement, no significant ST segment or T wave changes.   Patient was placed on diuretic therapy with improvement in his symptoms.   Echocardiogram with reduced LV systolic function.   Started on guideline directed medical therapy for heart failure.  10/31 patient will continue medical therapy and have close follow up as outpatient.   Assessment and Plan: * Acute on chronic systolic CHF (congestive heart failure) (HCC) Echocardiogram with reduced LV systolic function with EF 25 to 30%, global hypokinesis, LV internal cavity with severe dilatation, mild LVH, RV systolic function preserved, LA with severe dilatation, small pericardial effusion.  Patient has been placed on furosemide for diuresis, negative fluid balance -4,930 ml since admission.  Patient lost about 5 Kg weight during this hospitalization.  Systolic blood pressure 138 and 109 mmHg.   Continue with carvedilol, empagliflozin, spironolactone and after load reduction with hydralazine/ isosorbide.  Furosemide every other day from Monday to Friday.  Entresto.   Follow up with heart failure clinic.    Hypertensive urgency Continue blood pressure control with amlodipine, carvedilol, hydralazine and isosorbide.  Entresto.   Chronic kidney disease, stage 3a (HCC) AKI, hypokalemia.    Patient with improvement in volume status, renal function with serum cr at 1,85 with K at 3,4 and serum bicarbonate at 24. Na 137 Mg  take this   spironolactone 25 MG tablet Commonly known as: ALDACTONE Take 1 tablet (25 mg total) by mouth daily. Start taking on: August 05, 2023 What changed:  medication strength how much to take when to take this        Follow-up Information     [facility]. Go in 12 day(s).   Specialty: Cardiology Why: Advanced Heart Failure Clinic at 2:30 PM Entrance C, Free [place] Parking Please bring all medications to appointment Contact information: [address] [place] [address] [phone]               Discharge Exam: Leonard Lewis Weights   08/02/23 0508 08/03/23 0404 08/04/23 0336  Weight: (!) 188.1 kg (!) 189 kg (!) 189.2 kg   BP (!) 137/95   Pulse 82   Temp 98.1 F (36.7 C) (Oral)   Resp 20   Ht 5\' 11"  (1.803 m)   Wt (!) 189.2 kg   SpO2 95%   BMI 58.18 kg/m   Patient is feeling better, dyspnea has  improved, no edema, no PND, orthopnea or lower extremity edema. Right knee pain has improved with topical diclofenac.   Neurology awake and alert ENT with mild pallor Cardiovascular with S1 and S2 present and regular with no gallops, rubs or murmurs Respiratory with no rales or wheezing, no rhonchi Abdomen with no distention  No lower extremity edema.   Condition at discharge: stable  The results of significant diagnostics from this hospitalization (including imaging, microbiology, ancillary and laboratory) are listed below for reference.   Imaging Studies: ECHOCARDIOGRAM COMPLETE  Result Date: 08/01/2023    ECHOCARDIOGRAM REPORT   Patient Name:   Leonard Lewis Date of Exam: 08/01/2023 Medical Rec #:  [id number]       Height:       71.0 in Accession #:    [id number]      Weight:       429.2 lb Date of Birth:  10-Dec-1980      BSA:          2.917 m Patient Age:    41 years        BP:           141/91 mmHg Patient Gender: M               HR:           75 bpm. Exam Location:  Inpatient Procedure: 2D Echo, Color Doppler and Cardiac Doppler Indications:    I50.9* Heart failure (unspecified)  History:        Patient has prior history of Echocardiogram examinations, most                 recent 05/15/2022. CHF; Risk Factors:Hypertension, Dyslipidemia                 and Sleep Apnea.  Sonographer:    Irving Burton Senior RDCS Referring Phys: [id number] VISHAL R PATEL IMPRESSIONS  1. Left ventricular ejection fraction, by estimation, is 25 to 30%. The left ventricle has severely decreased function. The left ventricle demonstrates global hypokinesis. The left ventricular internal cavity size was severely dilated. There is mild concentric left ventricular hypertrophy. Left ventricular diastolic parameters are consistent with Grade III diastolic dysfunction (restrictive).  2. Right ventricular systolic function is normal. The right ventricular size is normal. Tricuspid regurgitation signal is inadequate for assessing PA  pressure.  3. Left atrial size was severely dilated.  4.  take this   spironolactone 25 MG tablet Commonly known as: ALDACTONE Take 1 tablet (25 mg total) by mouth daily. Start taking on: August 05, 2023 What changed:  medication strength how much to take when to take this        Follow-up Information     [facility]. Go in 12 day(s).   Specialty: Cardiology Why: Advanced Heart Failure Clinic at 2:30 PM Entrance C, Free [place] Parking Please bring all medications to appointment Contact information: [address] [place] [address] [phone]               Discharge Exam: Leonard Lewis Weights   08/02/23 0508 08/03/23 0404 08/04/23 0336  Weight: (!) 188.1 kg (!) 189 kg (!) 189.2 kg   BP (!) 137/95   Pulse 82   Temp 98.1 F (36.7 C) (Oral)   Resp 20   Ht 5\' 11"  (1.803 m)   Wt (!) 189.2 kg   SpO2 95%   BMI 58.18 kg/m   Patient is feeling better, dyspnea has  improved, no edema, no PND, orthopnea or lower extremity edema. Right knee pain has improved with topical diclofenac.   Neurology awake and alert ENT with mild pallor Cardiovascular with S1 and S2 present and regular with no gallops, rubs or murmurs Respiratory with no rales or wheezing, no rhonchi Abdomen with no distention  No lower extremity edema.   Condition at discharge: stable  The results of significant diagnostics from this hospitalization (including imaging, microbiology, ancillary and laboratory) are listed below for reference.   Imaging Studies: ECHOCARDIOGRAM COMPLETE  Result Date: 08/01/2023    ECHOCARDIOGRAM REPORT   Patient Name:   Leonard Lewis Date of Exam: 08/01/2023 Medical Rec #:  [id number]       Height:       71.0 in Accession #:    [id number]      Weight:       429.2 lb Date of Birth:  10-Dec-1980      BSA:          2.917 m Patient Age:    41 years        BP:           141/91 mmHg Patient Gender: M               HR:           75 bpm. Exam Location:  Inpatient Procedure: 2D Echo, Color Doppler and Cardiac Doppler Indications:    I50.9* Heart failure (unspecified)  History:        Patient has prior history of Echocardiogram examinations, most                 recent 05/15/2022. CHF; Risk Factors:Hypertension, Dyslipidemia                 and Sleep Apnea.  Sonographer:    Irving Burton Senior RDCS Referring Phys: [id number] VISHAL R PATEL IMPRESSIONS  1. Left ventricular ejection fraction, by estimation, is 25 to 30%. The left ventricle has severely decreased function. The left ventricle demonstrates global hypokinesis. The left ventricular internal cavity size was severely dilated. There is mild concentric left ventricular hypertrophy. Left ventricular diastolic parameters are consistent with Grade III diastolic dysfunction (restrictive).  2. Right ventricular systolic function is normal. The right ventricular size is normal. Tricuspid regurgitation signal is inadequate for assessing PA  pressure.  3. Left atrial size was severely dilated.  4.  Physician Discharge Summary   Patient: Leonard Lewis MRN: [id number] DOB: 1980/12/09  Admit date:     07/31/2023  Discharge date: 08/04/23  Discharge Physician: Coralie Keens   PCP: Georganna Skeans, MD   Recommendations at discharge:    Patient has been placed on heart failure guideline medical therapy with carvedilol, empagliflozin, hydralazine, isosorbide, entresto, spironlactone. Loop diuretic therapy with furosemide 40 mg on Monday, Wednesday and Friday. Extra dose as needed in case of volume overload, weight gain 2 to 3 lbs in 24 hrs or 5 lbs for 7 days.  40 Kcl  when taking furosemide.  Follow up renal function and electrolytes in 7 as outpatient.  Follow up with Dr Andrey Campanile in 7 to 10 days. Follow up with heart failure clinic as scheduled.   Discharge Diagnoses: Principal Problem:   Acute on chronic systolic CHF (congestive heart failure) (HCC) Active Problems:   Hypertensive urgency   Chronic kidney disease, stage 3a (HCC)   Iron deficiency anemia   HLD (hyperlipidemia)   Obstructive sleep apnea  Resolved Problems:   * No resolved hospital problems. [facility] Course: Leonard Lewis was admitted to the hospital with the working diagnosis of heart failure exacerbation.   41/M with past medical history of obesity class 3, OSA,  CHF, CKD 3A, hypertension,iron deficiency anemia, asthma, gout, anxiety presented to the ED with progressive shortness of breath for few days. He endorsed not being compliant on his medications. On his initial physical examination his blood pressure was 187/123. HR 86, RR 21 and 02 saturation 98% on room air. Lungs with no wheezing or rales, no rhonchi, heart with S1 and S2 present and regular with no gallops, rubs or murmurs, abdomen with no distention, positive lower extremity edema.   Na 141, K 3,8 CL 107 bicarbonate 24, glucose 95 bun 24 cr 1,67  Mg 1,7  AST 19 ALT 13  Wbc 10,5 hgb 11.6 plt 312  Sars covid 19 negative  Chest radiograph  with cardiomegaly, bilateral hilar vascular congestion, bilateral central interstitial infiltrates with cephalization of the vasculature.  No pleural effusions.   EKG 83 bpm, left axis deviation, normal intervals, sinus rhythm with bi atrial enlargement, no significant ST segment or T wave changes.   Patient was placed on diuretic therapy with improvement in his symptoms.   Echocardiogram with reduced LV systolic function.   Started on guideline directed medical therapy for heart failure.  10/31 patient will continue medical therapy and have close follow up as outpatient.   Assessment and Plan: * Acute on chronic systolic CHF (congestive heart failure) (HCC) Echocardiogram with reduced LV systolic function with EF 25 to 30%, global hypokinesis, LV internal cavity with severe dilatation, mild LVH, RV systolic function preserved, LA with severe dilatation, small pericardial effusion.  Patient has been placed on furosemide for diuresis, negative fluid balance -4,930 ml since admission.  Patient lost about 5 Kg weight during this hospitalization.  Systolic blood pressure 138 and 109 mmHg.   Continue with carvedilol, empagliflozin, spironolactone and after load reduction with hydralazine/ isosorbide.  Furosemide every other day from Monday to Friday.  Entresto.   Follow up with heart failure clinic.    Hypertensive urgency Continue blood pressure control with amlodipine, carvedilol, hydralazine and isosorbide.  Entresto.   Chronic kidney disease, stage 3a (HCC) AKI, hypokalemia.    Patient with improvement in volume status, renal function with serum cr at 1,85 with K at 3,4 and serum bicarbonate at 24. Na 137 Mg  take this   spironolactone 25 MG tablet Commonly known as: ALDACTONE Take 1 tablet (25 mg total) by mouth daily. Start taking on: August 05, 2023 What changed:  medication strength how much to take when to take this        Follow-up Information     [facility]. Go in 12 day(s).   Specialty: Cardiology Why: Advanced Heart Failure Clinic at 2:30 PM Entrance C, Free [place] Parking Please bring all medications to appointment Contact information: [address] [place] [address] [phone]               Discharge Exam: Leonard Lewis Weights   08/02/23 0508 08/03/23 0404 08/04/23 0336  Weight: (!) 188.1 kg (!) 189 kg (!) 189.2 kg   BP (!) 137/95   Pulse 82   Temp 98.1 F (36.7 C) (Oral)   Resp 20   Ht 5\' 11"  (1.803 m)   Wt (!) 189.2 kg   SpO2 95%   BMI 58.18 kg/m   Patient is feeling better, dyspnea has  improved, no edema, no PND, orthopnea or lower extremity edema. Right knee pain has improved with topical diclofenac.   Neurology awake and alert ENT with mild pallor Cardiovascular with S1 and S2 present and regular with no gallops, rubs or murmurs Respiratory with no rales or wheezing, no rhonchi Abdomen with no distention  No lower extremity edema.   Condition at discharge: stable  The results of significant diagnostics from this hospitalization (including imaging, microbiology, ancillary and laboratory) are listed below for reference.   Imaging Studies: ECHOCARDIOGRAM COMPLETE  Result Date: 08/01/2023    ECHOCARDIOGRAM REPORT   Patient Name:   Leonard Lewis Date of Exam: 08/01/2023 Medical Rec #:  [id number]       Height:       71.0 in Accession #:    [id number]      Weight:       429.2 lb Date of Birth:  10-Dec-1980      BSA:          2.917 m Patient Age:    41 years        BP:           141/91 mmHg Patient Gender: M               HR:           75 bpm. Exam Location:  Inpatient Procedure: 2D Echo, Color Doppler and Cardiac Doppler Indications:    I50.9* Heart failure (unspecified)  History:        Patient has prior history of Echocardiogram examinations, most                 recent 05/15/2022. CHF; Risk Factors:Hypertension, Dyslipidemia                 and Sleep Apnea.  Sonographer:    Irving Burton Senior RDCS Referring Phys: [id number] VISHAL R PATEL IMPRESSIONS  1. Left ventricular ejection fraction, by estimation, is 25 to 30%. The left ventricle has severely decreased function. The left ventricle demonstrates global hypokinesis. The left ventricular internal cavity size was severely dilated. There is mild concentric left ventricular hypertrophy. Left ventricular diastolic parameters are consistent with Grade III diastolic dysfunction (restrictive).  2. Right ventricular systolic function is normal. The right ventricular size is normal. Tricuspid regurgitation signal is inadequate for assessing PA  pressure.  3. Left atrial size was severely dilated.  4.

## 2023-08-05 ENCOUNTER — Other Ambulatory Visit: Payer: Self-pay

## 2023-08-05 ENCOUNTER — Telehealth: Payer: Self-pay | Admitting: Family Medicine

## 2023-08-05 DIAGNOSIS — M79672 Pain in left foot: Secondary | ICD-10-CM

## 2023-08-05 MED ORDER — ALLOPURINOL 100 MG PO TABS: 100.0000 mg | ORAL_TABLET | Freq: Every day | ORAL | 2 refills | Status: DC

## 2023-08-05 NOTE — Telephone Encounter (Signed)
   Patient dropped off document FMLA, to be filled out by provider. Patient requested to send it back via Fax within 7-days. Document is located in providers tray at front office.Please advise at Mobile [phone] (mobile)

## 2023-08-10 ENCOUNTER — Ambulatory Visit: Payer: 59 | Admitting: Family Medicine

## 2023-08-10 ENCOUNTER — Encounter: Payer: Self-pay | Admitting: Family Medicine

## 2023-08-10 VITALS — BP 101/63 | HR 86 | Temp 97.7°F | Resp 18 | Wt >= 6400 oz

## 2023-08-10 DIAGNOSIS — Z0289 Encounter for other administrative examinations: Secondary | ICD-10-CM

## 2023-08-10 DIAGNOSIS — I5042 Chronic combined systolic (congestive) and diastolic (congestive) heart failure: Secondary | ICD-10-CM | POA: Diagnosis not present

## 2023-08-10 DIAGNOSIS — F419 Anxiety disorder, unspecified: Secondary | ICD-10-CM

## 2023-08-10 DIAGNOSIS — G4733 Obstructive sleep apnea (adult) (pediatric): Secondary | ICD-10-CM

## 2023-08-10 DIAGNOSIS — Z09 Encounter for follow-up examination after completed treatment for conditions other than malignant neoplasm: Secondary | ICD-10-CM | POA: Diagnosis not present

## 2023-08-10 DIAGNOSIS — F32A Depression, unspecified: Secondary | ICD-10-CM

## 2023-08-10 MED ORDER — SERTRALINE HCL 100 MG PO TABS: 100.0000 mg | ORAL_TABLET | Freq: Every day | ORAL | 1 refills | Status: DC

## 2023-08-10 NOTE — Progress Notes (Unsigned)
Established Patient Office Visit  Subjective    Patient ID: Leonard Lewis, male    DOB: 24-May-1981  Age: 42 y.o. MRN: [id number]  CC:  Chief Complaint  Patient presents with   Medical Management of Chronic Issues    HFU/ disability papers    HPI BRYCIN Lewis presents for hospital discharge follow up where he was admitted for acute CHF. He reports much improvement which has continued since discharge. He reports that he has not been nor is he now compliant with his meds as recommended.   Outpatient Encounter Medications as of 08/10/2023  Medication Sig   albuterol (VENTOLIN HFA) 108 (90 Base) MCG/ACT inhaler Inhale 2 puffs into the lungs every 6 (six) hours as needed for wheezing or shortness of breath.   allopurinol (ZYLOPRIM) 100 MG tablet TAKE 1 TABLET BY MOUTH EVERY DAY   sacubitril-valsartan (ENTRESTO) 49-51 MG Take 1 tablet by mouth 2 (two) times daily.   allopurinol (ZYLOPRIM) 100 MG tablet Take 1 tablet (100 mg total) by mouth daily.   aspirin EC 81 MG tablet Take 81 mg by mouth daily. Swallow whole.   atorvastatin (LIPITOR) 40 MG tablet Take 1 tablet (40 mg total) by mouth daily.   carvedilol (COREG) 25 MG tablet Take 1 tablet (25 mg total) by mouth 2 (two) times daily.   colchicine 0.6 MG tablet Take 1.2 mg ( 2 tablets)  by mouth  as a single dose at the first sign of gout flare, followed by 0.6 mg ( 1 tablet)  BY MOUTH 1 hour later, then 0.6 mg ( 1 tablet) daily for 6 days   diclofenac Sodium (VOLTAREN) 1 % GEL Apply 2 g topically 4 (four) times daily. Apply to right knee.   empagliflozin (JARDIANCE) 10 MG TABS tablet Take 1 tablet (10 mg total) by mouth daily.   furosemide (LASIX) 40 MG tablet Take 1 tablet (40 mg total) by mouth every Monday, Wednesday, and Friday. Take extra dose (including Tuesday, Thursday, Saturday or Sunday) in case of weight gain 2 to 3 lbs in 24 hrs or 5 lbs in 7 days, until weight back to baseline.   hydrALAZINE (APRESOLINE) 50 MG tablet Take 1  tablet (50 mg total) by mouth every 8 (eight) hours.   isosorbide dinitrate (ISORDIL) 10 MG tablet Take 1 tablet (10 mg total) by mouth 3 (three) times daily.   metFORMIN (GLUCOPHAGE) 500 MG tablet Take 1 tablet (500 mg total) by mouth daily with breakfast.   potassium chloride SA (KLOR-CON M) 20 MEQ tablet Take 2 tablets (40 mEq total) by mouth every Monday, Wednesday, and Friday. Take one tablet when taking extra tablet of furosemide.   Respiratory Therapy Supplies (CARETOUCH CPAP MASK WIPES) MISC For cpap machine   sertraline (ZOLOFT) 100 MG tablet Take 1 tablet (100 mg total) by mouth daily.   spironolactone (ALDACTONE) 25 MG tablet Take 1 tablet (25 mg total) by mouth daily.   [DISCONTINUED] sertraline (ZOLOFT) 100 MG tablet Take 1 tablet (100 mg total) by mouth daily. (Patient taking differently: Take 100 mg by mouth daily as needed (for depression/anxiety).)   No facility-administered encounter medications on file as of 08/10/2023.    Past Medical History:  Diagnosis Date   Arthritis    Asthma    Back pain    CHF (congestive heart failure) (HCC) 2008   EF 40-50%         Dyslipidemia    Fatty liver    Hypertension    Leg  edema    Obesity, morbid (HCC)    Obstructive sleep apnea    Prediabetes     Past Surgical History:  Procedure Laterality Date   CARDIAC CATHETERIZATION  Oct 2013   cardiac cath negative for obstructive disease -- did show end diastolic pressure secondary to systemic hypertension   LAPAROSCOPIC GASTRIC SLEEVE RESECTION N/A 01/27/2015   Procedure: LAPAROSCOPIC GASTRIC SLEEVE RESECTION WITH UPPER ENDOSCOPY;  Surgeon: Luretha Murphy, MD;  Location: [facility];  Service: General;  Laterality: N/A;   LEFT HEART CATHETERIZATION WITH CORONARY ANGIOGRAM N/A 08/03/2012   Procedure: LEFT HEART CATHETERIZATION WITH CORONARY ANGIOGRAM;  Surgeon: Tonny Bollman, MD;  Location: [facility] CATH LAB;  Service: Cardiovascular;  Laterality: N/A;   NO PAST SURGERIES      Family History   Problem Relation Age of Onset   Hypertension Mother    Obesity Mother    Hypertension Father     Social History   Socioeconomic History   Marital status: Married    Spouse name: Rickeda   Number of children: 2   Years of education: Not on file   Highest education level: Not on file  Occupational History   Occupation: Art gallery manager   Occupation: Training and development officer  Tobacco Use   Smoking status: Former    Current packs/day: 0.00    Types: Cigarettes    Quit date: 10/04/2006    Years since quitting: 16.8   Smokeless tobacco: Never  Vaping Use   Vaping status: Never Used  Substance and Sexual Activity   Alcohol use: No   Drug use: No   Sexual activity: Not on file  Other Topics Concern   Not on file  Social History Narrative   Employed:  Services ATM machines.    Social Determinants of Health   Financial Resource Strain: Medium Risk (08/01/2023)   Overall Financial Resource Strain (CARDIA)    Difficulty of Paying Living Expenses: Somewhat hard  Food Insecurity: No Food Insecurity (07/31/2023)   Hunger Vital Sign    Worried About Running Out of Food in the Last Year: Never true    Ran Out of Food in the Last Year: Never true  Transportation Needs: No Transportation Needs (07/31/2023)   PRAPARE - Administrator, Civil Service (Medical): No    Lack of Transportation (Non-Medical): No  Physical Activity: Inactive (08/10/2023)   Exercise Vital Sign    Days of Exercise per Week: 0 days    Minutes of Exercise per Session: 0 min  Stress: No Stress Concern Present (08/10/2023)   [employer] of Occupational Health - Occupational Stress Questionnaire    Feeling of Stress : Only a little  Social Connections: Unknown (08/10/2023)   Social Connection and Isolation Panel [NHANES]    Frequency of Communication with Friends and Family: Twice a week    Frequency of Social Gatherings with Friends and Family: Not on file    Attends Religious Services: 1 to 4 times per  year    Active Member of [employer] or Organizations: No    Attends Banker Meetings: Never    Marital Status: Married  Catering manager Violence: Not At Risk (07/31/2023)   Humiliation, Afraid, Rape, and Kick questionnaire    Fear of Current or Ex-Partner: No    Emotionally Abused: No    Physically Abused: No    Sexually Abused: No    Review of Systems  All other systems reviewed and are negative.       Objective  BP 101/63   Pulse 86   Temp 97.7 F (36.5 C) (Oral)   Resp 18   Wt (!) 412 lb (186.9 kg)   SpO2 96%   BMI 57.46 kg/m   Physical Exam Vitals and nursing note reviewed.  Constitutional:      General: He is not in acute distress.    Appearance: He is obese.  Cardiovascular:     Rate and Rhythm: Normal rate and regular rhythm.  Pulmonary:     Effort: Pulmonary effort is normal. No respiratory distress.     Breath sounds: Normal breath sounds. No wheezing.  Abdominal:     Palpations: Abdomen is soft.     Tenderness: There is no abdominal tenderness.  Neurological:     General: No focal deficit present.     Mental Status: He is alert and oriented to person, place, and time.  Psychiatric:        Mood and Affect: Mood and affect normal.        Speech: Speech normal.        Behavior: Behavior normal. Behavior is cooperative.         Assessment & Plan:   1. Chronic combined systolic (congestive) and diastolic (congestive) heart failure (HCC) Improved. Labs ordered. Keep scheduled appts with consultant.  2. Obstructive sleep apnea Discussed compliance with cpap  3. Anxiety and depression Patient prescribed zoloft 100mg  daily    4. Hospital discharge follow-up   5. Encounter for completion of form with patient Form completed for employment disability     Return in about 6 weeks (around 09/21/2023).   Tommie Raymond, MD

## 2023-08-11 ENCOUNTER — Encounter: Payer: Self-pay | Admitting: Family Medicine

## 2023-08-15 ENCOUNTER — Encounter (HOSPITAL_COMMUNITY): Payer: 59

## 2023-08-17 ENCOUNTER — Other Ambulatory Visit (HOSPITAL_COMMUNITY): Payer: Self-pay

## 2023-08-17 ENCOUNTER — Ambulatory Visit (HOSPITAL_COMMUNITY)
Admit: 2023-08-17 | Discharge: 2023-08-17 | Disposition: A | Discharge status: Home / Self Care | Payer: 59 | Source: Ambulatory Visit | Attending: Family Medicine | Admitting: Family Medicine

## 2023-08-17 ENCOUNTER — Encounter (HOSPITAL_COMMUNITY): Payer: Self-pay

## 2023-08-17 VITALS — BP 142/68 | HR 83 | Wt >= 6400 oz

## 2023-08-17 DIAGNOSIS — I130 Hypertensive heart and chronic kidney disease with heart failure and stage 1 through stage 4 chronic kidney disease, or unspecified chronic kidney disease: Secondary | ICD-10-CM | POA: Insufficient documentation

## 2023-08-17 DIAGNOSIS — I5042 Chronic combined systolic (congestive) and diastolic (congestive) heart failure: Secondary | ICD-10-CM | POA: Diagnosis present

## 2023-08-17 DIAGNOSIS — Z7984 Long term (current) use of oral hypoglycemic drugs: Secondary | ICD-10-CM | POA: Diagnosis not present

## 2023-08-17 DIAGNOSIS — E785 Hyperlipidemia, unspecified: Secondary | ICD-10-CM | POA: Diagnosis not present

## 2023-08-17 DIAGNOSIS — Z9884 Bariatric surgery status: Secondary | ICD-10-CM | POA: Diagnosis not present

## 2023-08-17 DIAGNOSIS — I3139 Other pericardial effusion (noninflammatory): Secondary | ICD-10-CM | POA: Insufficient documentation

## 2023-08-17 DIAGNOSIS — Z79899 Other long term (current) drug therapy: Secondary | ICD-10-CM | POA: Diagnosis not present

## 2023-08-17 DIAGNOSIS — F419 Anxiety disorder, unspecified: Secondary | ICD-10-CM | POA: Insufficient documentation

## 2023-08-17 DIAGNOSIS — Z6841 Body Mass Index (BMI) 40.0 and over, adult: Secondary | ICD-10-CM | POA: Insufficient documentation

## 2023-08-17 DIAGNOSIS — E782 Mixed hyperlipidemia: Secondary | ICD-10-CM

## 2023-08-17 DIAGNOSIS — N1831 Chronic kidney disease, stage 3a: Secondary | ICD-10-CM | POA: Diagnosis not present

## 2023-08-17 DIAGNOSIS — G4733 Obstructive sleep apnea (adult) (pediatric): Secondary | ICD-10-CM | POA: Insufficient documentation

## 2023-08-17 DIAGNOSIS — I10 Essential (primary) hypertension: Secondary | ICD-10-CM | POA: Diagnosis not present

## 2023-08-17 DIAGNOSIS — D631 Anemia in chronic kidney disease: Secondary | ICD-10-CM | POA: Insufficient documentation

## 2023-08-17 DIAGNOSIS — I5022 Chronic systolic (congestive) heart failure: Secondary | ICD-10-CM

## 2023-08-17 DIAGNOSIS — M109 Gout, unspecified: Secondary | ICD-10-CM | POA: Diagnosis not present

## 2023-08-17 DIAGNOSIS — J45909 Unspecified asthma, uncomplicated: Secondary | ICD-10-CM | POA: Diagnosis not present

## 2023-08-17 DIAGNOSIS — D509 Iron deficiency anemia, unspecified: Secondary | ICD-10-CM

## 2023-08-17 LAB — BRAIN NATRIURETIC PEPTIDE: B Natriuretic Peptide: 68.2 pg/mL (ref 0.0–100.0)

## 2023-08-17 LAB — BASIC METABOLIC PANEL
Anion gap: 8 (ref 5–15)
BUN: 29 mg/dL — ABNORMAL HIGH (ref 6–20)
CO2: 24 mmol/L (ref 22–32)
Calcium: 9.4 mg/dL (ref 8.9–10.3)
Chloride: 106 mmol/L (ref 98–111)
Creatinine, Ser: 2.59 mg/dL — ABNORMAL HIGH (ref 0.61–1.24)
GFR, Estimated: 31 mL/min — ABNORMAL LOW (ref >60)
Glucose, Bld: 101 mg/dL — ABNORMAL HIGH (ref 70–99)
Potassium: 5.1 mmol/L (ref 3.5–5.1)
Sodium: 138 mmol/L (ref 135–145)

## 2023-08-17 MED ORDER — SPIRONOLACTONE 25 MG PO TABS: 25.0000 mg | ORAL_TABLET | Freq: Every day | ORAL | 6 refills | Status: DC

## 2023-08-17 MED ORDER — ENTRESTO 97-103 MG PO TABS: 1.0000 | ORAL_TABLET | Freq: Two times a day (BID) | ORAL | 6 refills | Status: DC

## 2023-08-17 MED ORDER — ASPIRIN 81 MG PO TBEC: 81.0000 mg | DELAYED_RELEASE_TABLET | Freq: Every day | ORAL | 6 refills | Status: AC

## 2023-08-17 MED ORDER — POTASSIUM CHLORIDE CRYS ER 20 MEQ PO TBCR: 40.0000 meq | EXTENDED_RELEASE_TABLET | ORAL | 6 refills | Status: DC

## 2023-08-17 MED ORDER — ATORVASTATIN CALCIUM 40 MG PO TABS: 40.0000 mg | ORAL_TABLET | Freq: Every day | ORAL | 6 refills | Status: DC

## 2023-08-17 MED ORDER — CARVEDILOL 25 MG PO TABS: 25.0000 mg | ORAL_TABLET | Freq: Two times a day (BID) | ORAL | 6 refills | Status: DC

## 2023-08-17 MED ORDER — FUROSEMIDE 40 MG PO TABS: 40.0000 mg | ORAL_TABLET | ORAL | 6 refills | Status: AC

## 2023-08-17 MED ORDER — EMPAGLIFLOZIN 10 MG PO TABS: 10.0000 mg | ORAL_TABLET | Freq: Every day | ORAL | 6 refills | Status: DC

## 2023-08-17 MED ORDER — HYDRALAZINE HCL 50 MG PO TABS: 50.0000 mg | ORAL_TABLET | Freq: Three times a day (TID) | ORAL | 6 refills | Status: DC

## 2023-08-17 MED ORDER — ISOSORBIDE DINITRATE 10 MG PO TABS: 10.0000 mg | ORAL_TABLET | Freq: Three times a day (TID) | ORAL | 6 refills | Status: DC

## 2023-08-17 NOTE — Patient Instructions (Addendum)
Thank you for coming in today  If you had labs drawn today, any labs that are abnormal the clinic will call you No news is good news  You have been referred to cardiac rehab they will contact you for further details  You have been referred to pharmacy, their office will call you for further appointment details    Medications: Increase Entresto 97/103 mg 1 tablet twice daily   Follow up appointments:  Your physician recommends that you schedule a follow-up appointment in:  3-4 weeks with Pharmacy  3 months With Dr. Gasper Lloyd with echocardiogram You will receive a reminder letter in the mail a few months in advance. If you don't receive a letter, please call our office to schedule the follow-up appointment.      Do the following things EVERYDAY: Weigh yourself in the morning before breakfast. Write it down and keep it in a log. Take your medicines as prescribed Eat low salt foods--Limit salt (sodium) to 2000 mg per day.  Stay as active as you can everyday Limit all fluids for the day to less than 2 liters   At the [facility], you and your health needs are our priority. As part of our continuing mission to provide you with exceptional heart care, we have created designated Provider Care Teams. These Care Teams include your primary Cardiologist (physician) and Advanced Practice Providers (APPs- Physician Assistants and Nurse Practitioners) who all work together to provide you with the care you need, when you need it.   You may see any of the following providers on your designated Care Team at your next follow up: Dr Arvilla Meres Dr Marca Ancona Dr. Marcos Eke, NP Robbie Lis, [place] [facility] [place], [place] Brynda Peon, NP Karle Plumber, PharmD   Please be sure to bring in all your medications bottles to every appointment.    Thank you for choosing [facility]  If you have any  questions or concerns before your next appointment please send [place] a message through [place] or call our office at [phone].    TO LEAVE A MESSAGE FOR THE NURSE SELECT OPTION 2, PLEASE LEAVE A MESSAGE INCLUDING: YOUR NAME DATE OF BIRTH CALL BACK NUMBER REASON FOR CALL**this is important as we prioritize the call backs  YOU WILL RECEIVE A CALL BACK THE SAME DAY AS LONG AS YOU CALL BEFORE 4:00 PM

## 2023-08-17 NOTE — Progress Notes (Signed)
[facility] CLINIC CONSULT NOTE  Primary Care: Georganna Skeans, MD Primary Cardiologist: Dr. Gasper Lloyd  HPI: Leonard Lewis is a 42 y.o. male with medical history significant for chronic HFpEF (EF 45-50% by TTE 05/2022), CKD stage IIIa, HTN, HLD, microcytic anemia, asthma, gout, anxiety, OSA.   Admitted 8/23, for chest pain and shortness of breath with fever. Septic 2/2 CAP and treated with abx. Echo showed EF 45-50%.    Since then has had multiple ED visits this year for otitis media and frequent gout flares. Closely followed as an outpatient by PCP for hypertension, gout and weight management.    Admitted 10/24 with a/c HF and hypertensive urgency. Had not been compliance with medications. Echo showed EF 25-30%, mild LVH, G3DD, normal RV. GDMT titrated and he was discharged home, weight 416 lbs.   Today he returns for post hospital HF follow up with his wife. Overall feeling fine. He has SOB walking long distances on flat ground. Denies palpitations, CP, dizziness, edema, or PND/Orthopnea. Appetite ok. No fever or chills. Weight at home 410-413 pounds. Taking all medications. He has CPAP, wears occasionally. BP at home  130-140/80-90s. He already returned to work, did OK with this.   Cardiac Studies:  - Echo 10/24: EF 25-30% global HK, LV severely dilated, grade III diastolic dysfunction, RV nl, LA dilated, small pericardial effusion, no aortic dilation. RA ~8.  - Echo 8/23: EF 45-50%, LV w wall abnormalities, grade I diastolic dysfunction, aortic root dilation noted 40 mm.  - LHC 10/13: widely patent coronaries  Review of Systems: [y] = yes, [ ]  = no   General: Weight gain [ ] ; Weight loss [ ] ; Anorexia [ ] ; Fatigue [ ] ; Fever [ ] ; Chills [ ] ; Weakness [ ]   Cardiac: Chest pain/pressure [ ] ; Resting SOB [ ] ; Exertional SOB Cove.Etienne ]; Orthopnea [ ] ; Pedal Edema [ ] ; Palpitations [ ] ; Syncope [ ] ; Presyncope [ ] ; Paroxysmal nocturnal dyspnea[ ]   Pulmonary: Cough [ ] ; Wheezing[ ] ;  Hemoptysis[ ] ; Sputum [ ] ; Snoring [ ]   GI: Vomiting[ ] ; Dysphagia[ ] ; Melena[ ] ; Hematochezia [ ] ; Heartburn[ ] ; Abdominal pain [ ] ; Constipation [ ] ; Diarrhea [ ] ; BRBPR [ ]   GU: Hematuria[ ] ; Dysuria [ ] ; Nocturia[ ]   Vascular: Pain in legs with walking [ ] ; Pain in feet with lying flat [ ] ; Non-healing sores [ ] ; Stroke [ ] ; TIA [ ] ; Slurred speech [ ] ;  Neuro: Headaches[ ] ; Vertigo[ ] ; Seizures[ ] ; Paresthesias[ ] ;Blurred vision [ ] ; Diplopia [ ] ; Vision changes [ ]   Ortho/Skin: Arthritis [ ] ; Joint pain [ ] ; Muscle pain [ ] ; Joint swelling [ ] ; Back Pain [ ] ; Rash [ ]   Psych: Depression[ ] ; Anxiety[ ]   Heme: Bleeding problems [ ] ; Clotting disorders [ ] ; Anemia Cove.Etienne ]  Endocrine: Diabetes [ ] ; Thyroid dysfunction[ ]   Past Medical History:  Diagnosis Date   Arthritis    Asthma    Back pain    CHF (congestive heart failure) (HCC) 2008   EF 40-50%         Dyslipidemia    Fatty liver    Hypertension    Leg edema    Obesity, morbid (HCC)    Obstructive sleep apnea    Prediabetes    Current Outpatient Medications  Medication Sig Dispense Refill   albuterol (VENTOLIN HFA) 108 (90 Base) MCG/ACT inhaler Inhale 2 puffs into the lungs every 6 (six) hours as needed for wheezing or shortness of breath. 8  g 0   allopurinol (ZYLOPRIM) 100 MG tablet TAKE 1 TABLET BY MOUTH EVERY DAY 30 tablet 2   aspirin EC 81 MG tablet Take 81 mg by mouth daily. Swallow whole.     atorvastatin (LIPITOR) 40 MG tablet Take 1 tablet (40 mg total) by mouth daily. 30 tablet 0   carvedilol (COREG) 25 MG tablet Take 1 tablet (25 mg total) by mouth 2 (two) times daily. 60 tablet 0   colchicine 0.6 MG tablet Take 1.2 mg ( 2 tablets)  by mouth  as a single dose at the first sign of gout flare, followed by 0.6 mg ( 1 tablet)  BY MOUTH 1 hour later, then 0.6 mg ( 1 tablet) daily for 6 days (Patient taking differently: Take 1.2 mg ( 2 tablets)  by mouth  as a single dose at the first sign of gout flare, followed by 0.6 mg ( 1  tablet)  BY MOUTH 1 hour later, then 0.6 mg ( 1 tablet) daily for 6 days as needed) 30 tablet 0   diclofenac Sodium (VOLTAREN) 1 % GEL Apply 2 g topically 4 (four) times daily. Apply to right knee. 50 g 0   empagliflozin (JARDIANCE) 10 MG TABS tablet Take 1 tablet (10 mg total) by mouth daily. 30 tablet 0   furosemide (LASIX) 40 MG tablet Take 1 tablet (40 mg total) by mouth every Monday, Wednesday, and Friday. Take extra dose (including Tuesday, Thursday, Saturday or Sunday) in case of weight gain 2 to 3 lbs in 24 hrs or 5 lbs in 7 days, until weight back to baseline. 30 tablet 0   hydrALAZINE (APRESOLINE) 50 MG tablet Take 1 tablet (50 mg total) by mouth every 8 (eight) hours. 90 tablet 0   isosorbide dinitrate (ISORDIL) 10 MG tablet Take 1 tablet (10 mg total) by mouth 3 (three) times daily. 90 tablet 0   metFORMIN (GLUCOPHAGE) 500 MG tablet Take 1 tablet (500 mg total) by mouth daily with breakfast. 90 tablet 0   potassium chloride SA (KLOR-CON M) 20 MEQ tablet Take 2 tablets (40 mEq total) by mouth every Monday, Wednesday, and Friday. Take one tablet when taking extra tablet of furosemide. 90 tablet 0   Respiratory Therapy Supplies (CARETOUCH CPAP MASK WIPES) MISC For cpap machine 1 each 3   sacubitril-valsartan (ENTRESTO) 49-51 MG Take 1 tablet by mouth 2 (two) times daily. 60 tablet 0   sertraline (ZOLOFT) 100 MG tablet Take 1 tablet (100 mg total) by mouth daily. 90 tablet 1   spironolactone (ALDACTONE) 25 MG tablet Take 1 tablet (25 mg total) by mouth daily. 30 tablet 0   No current facility-administered medications for this encounter.   No Known Allergies  Social History   Socioeconomic History   Marital status: Married    Spouse name: Rickeda   Number of children: 2   Years of education: Not on file   Highest education level: Not on file  Occupational History   Occupation: Art gallery manager   Occupation: Training and development officer  Tobacco Use   Smoking status: Former    Current packs/day:  0.00    Types: Cigarettes    Quit date: 10/04/2006    Years since quitting: 16.8   Smokeless tobacco: Never  Vaping Use   Vaping status: Never Used  Substance and Sexual Activity   Alcohol use: No   Drug use: No   Sexual activity: Not on file  Other Topics Concern   Not on file  Social History Narrative  Employed:  Services ATM machines.    Social Determinants of Health   Financial Resource Strain: Medium Risk (08/01/2023)   Overall Financial Resource Strain (CARDIA)    Difficulty of Paying Living Expenses: Somewhat hard  Food Insecurity: No Food Insecurity (07/31/2023)   Hunger Vital Sign    Worried About Running Out of Food in the Last Year: Never true    Ran Out of Food in the Last Year: Never true  Transportation Needs: No Transportation Needs (07/31/2023)   PRAPARE - Administrator, Civil Service (Medical): No    Lack of Transportation (Non-Medical): No  Physical Activity: Inactive (08/10/2023)   Exercise Vital Sign    Days of Exercise per Week: 0 days    Minutes of Exercise per Session: 0 min  Stress: No Stress Concern Present (08/10/2023)   [employer] of Occupational Health - Occupational Stress Questionnaire    Feeling of Stress : Only a little  Social Connections: Unknown (08/10/2023)   Social Connection and Isolation Panel [NHANES]    Frequency of Communication with Friends and Family: Twice a week    Frequency of Social Gatherings with Friends and Family: Not on file    Attends Religious Services: 1 to 4 times per year    Active Member of [employer] or Organizations: No    Attends Banker Meetings: Never    Marital Status: Married  Catering manager Violence: Not At Risk (07/31/2023)   Humiliation, Afraid, Rape, and Kick questionnaire    Fear of Current or Ex-Partner: No    Emotionally Abused: No    Physically Abused: No    Sexually Abused: No   Family History  Problem Relation Age of Onset   Hypertension Mother    Obesity Mother     Hypertension Father    BP (!) 142/68   Pulse 83   Wt (!) 192 kg (423 lb 3.2 oz)   SpO2 95%   BMI 59.02 kg/m   Wt Readings from Last 3 Encounters:  08/17/23 (!) 192 kg (423 lb 3.2 oz)  08/10/23 (!) 186.9 kg (412 lb)  08/04/23 (!) 189.2 kg (417 lb 1.8 oz)   PHYSICAL EXAM: General:  NAD. No resp difficulty, walked into clinic HEENT: Normal Neck: Supple. No JVD. Carotids 2+ bilat; no bruits. No lymphadenopathy or thryomegaly appreciated. Cor: PMI nondisplaced. Regular rate & rhythm. No rubs, gallops or murmurs. Lungs: Clear Abdomen: Soft, morbidly obese, nontender, nondistended. No hepatosplenomegaly. No bruits or masses. Good bowel sounds. Extremities: No cyanosis, clubbing, rash, edema Neuro: Alert & oriented x 3, cranial nerves grossly intact. Moves all 4 extremities w/o difficulty. Affect pleasant.  ECG (personally reviewed): NSR 88 bpm  ASSESSMENT & PLAN: Chronic combined systolic and diastolic heart failure - Likely related to poorly controlled essential hypertension, 187/123 on admission. Not consistent with medications. No CP.  - LHC from 2013 normal coronaries. - Echo 8/23: EF 45-50%, LV w wall abnormalities, grade I diastolic dysfunction, aortic root dilation noted 40 mm. - Echo 10/24: EF 25-30% global HK, LV severely dilated, grade III diastolic dysfunction, RV nl, LA dilated, small pericardial effusion, no aortic dilation. RA ~8.   - NYHA II, volume difficult due to hobdy habitus but does not appear volume up. - Increase Entresto to 97/103 mg bid - Continue Lasix 40 mg M, W, F + 20 KCL MWF - Continue Jardiance 10 mg daily. No GU symptoms - Continue spiro 25 mg  - Continue Coreg 25 mg  - Continue hydralazine 50  mg tid, continue isordil 10 tid - Repeat echo in 3 months. If EF not improved, consider CMR and R/LHC - Labs today - Refer to Cardiac Rehab   2. Essential Hypertension - Previously uncontrolled - BP better today but still elevated - Increase Entresto as  above   3. CKD IIIa - SCr baseline ~1.6 - Continue SGLT2i - labs today   4. Severe OSA/Nocturnal Hypoxemia - CPAP at night - No change   5. Obesity - Body mass index is 59.02 kg/m. - s/p gastric sleeve in 2013 - Previously on phentermine (stopped in May) - Refer to pharmD for semaglutide vs tirzepatide   6. Iron deficiency Anemia - Ferritin 22, T sat 8 - Received IV iron   7. HLD  - LDL 78 (10/24) - Continue atorvastatin 40  Follow up in 3-4 weeks with PharmD (will need BMET) and 3 months with Dr. Gasper Lloyd + echo.  He was given a note today for his employer. He has a sedentary job and may return to work without restrictions.  Prince Rome, FNP-BC 08/17/23

## 2023-08-19 ENCOUNTER — Other Ambulatory Visit (HOSPITAL_COMMUNITY): Payer: Self-pay

## 2023-08-19 MED ORDER — ENTRESTO 97-103 MG PO TABS: 1.0000 | ORAL_TABLET | Freq: Two times a day (BID) | ORAL | 6 refills | Status: DC

## 2023-08-19 NOTE — Addendum Note (Signed)
Encounter addended by: Chinita Pester, CMA on: 08/19/2023 2:09 PM  Actions taken: Order list changed

## 2023-08-23 ENCOUNTER — Telehealth (HOSPITAL_COMMUNITY): Payer: Self-pay | Admitting: *Deleted

## 2023-08-23 ENCOUNTER — Encounter (HOSPITAL_COMMUNITY): Payer: Self-pay

## 2023-08-23 ENCOUNTER — Inpatient Hospital Stay: Payer: 59 | Admitting: Family Medicine

## 2023-08-23 DIAGNOSIS — I5022 Chronic systolic (congestive) heart failure: Secondary | ICD-10-CM

## 2023-08-23 DIAGNOSIS — Z79899 Other long term (current) drug therapy: Secondary | ICD-10-CM

## 2023-08-23 MED ORDER — HYDRALAZINE HCL 50 MG PO TABS: 75.0000 mg | ORAL_TABLET | Freq: Three times a day (TID) | ORAL | 6 refills | Status: DC

## 2023-08-23 MED ORDER — ENTRESTO 49-51 MG PO TABS: 1.0000 | ORAL_TABLET | Freq: Two times a day (BID) | ORAL | 6 refills | Status: DC

## 2023-08-23 NOTE — Telephone Encounter (Signed)
Called patient's wife with following lab results and instructions per Prince Rome, NP:  "Renal function up a bit, BNP stable. Keep Entresto at 49/51 mg bid. Instead, increase hydralazine to 75 mg tid to get better BP control. BMET in 10-14 days.  Wife verbalized understanding of same. Updated Rx sent to requested pharmacy. Repeat lab ordered and scheduled. Asked wife to call us at [phone] if any questions or concerns.

## 2023-08-26 ENCOUNTER — Telehealth (HOSPITAL_COMMUNITY): Payer: Self-pay

## 2023-08-26 NOTE — Telephone Encounter (Signed)
Attempted to call pt in regards to Cardiac rehab. LM on VM

## 2023-08-26 NOTE — Telephone Encounter (Signed)
Pt called and stated he is interested in the Cardiac Rehab Program. Patient expressed interest. Explained scheduling process and went over insurance, patient verbalized understanding. Someone from our cardiac rehab staff will contact pt at a later time.

## 2023-08-26 NOTE — Telephone Encounter (Signed)
Pt insurance is active and benefits verified through [facility] Co-pay $0, DED $1600/$1600 met, out of pocket $3500/$2009.69 met, co-insurance 20%. no pre-authorization required. Passport, 08/26/2023@1244 , REF# [id number]   How many CR sessions are covered? 30 VISITS ONLY Is this a lifetime maximum or an annual maximum? Annuals Has the member used any of these services to date? no Is there a time limit (weeks/months) on start of program and/or program completion? no

## 2023-09-06 NOTE — Progress Notes (Signed)
Advanced Heart Failure Clinic Note   Primary Care: Georganna Skeans, MD Primary Cardiologist: Dr. Gasper Lloyd  HPI:  Leonard Lewis is a 42 y.o. male with medical history significant for chronic HFpEF (EF 45-50% by TTE 05/2022), CKD stage IIIa, HTN, HLD, microcytic anemia, asthma, gout, anxiety, OSA.   Admitted 05/2022, for chest pain and shortness of breath with fever. Septic 2/2 CAP and treated with abx. Echo showed EF 45-50%.    Since then has had multiple ED visits this year for otitis media and frequent gout flares. Closely followed as an outpatient by PCP for hypertension, gout and weight management.    Admitted 07/2023 with a/c HF and hypertensive urgency. Had not been compliant with medications. Echo showed EF 25-30%, mild LVH, G3DD, normal RV. GDMT titrated and he was discharged home, weight 416 lbs.    Presented to [facility] Clinic 08/17/23 for post hospital HF follow up with his wife. Overall was feeling fine. He was SOB walking long distances on flat ground. Denied palpitations, CP, dizziness, edema, or PND/Orthopnea. Appetite was ok. No fever or chills. Weight at home was 410-413 pounds. Reported taking all medications. He has CPAP, wears occasionally. BP at home was 130-140/80-90s. He already returned to work, did OK with this.   Today he returns to HF clinic for pharmacist medication titration. At last visit with APP, hydralazine was increased to 75 mg TID. It was decided not to increase Entresto as originally planned due to Scr elevation. Unfortunately, he was not able to increase hydralazine to 75 mg TID and has still been taking 50 mg TID (misunderstood instructions). Also notes he has been out of Jardiance for a few weeks. Overall he is feeling well today. No dizziness, lightheadedness, CP or palpitations. Has occasional fatigue. Notes breathing has been ok. Was able to walk from the valet to the clinic without stopping. Weight at home has been ~412-415 lbs. Has some mild swelling in  his leg, residual from gout flare. No PND/orthopnea. Appetite is ok. Referral for semaglutide pending.    HF Medications: Carvedilol 25 mg BID Entresto 49/51 mg BID Spironolactone 25 mg daily Hydralazine 75 mg TID - only taking 50 mg TID Isosorbide dinitrate 10 mg TID Jardiance 10 mg daily - ran out of this a few weeks ago Lasix 40 mg MWF KCL 40 mg MWF  Has the patient been experiencing any side effects to the medications prescribed?  no  Does the patient have any problems obtaining medications due to transportation or finances?   No; [employer] Nurse, learning disability  Understanding of regimen: good Understanding of indications: good Potential of compliance: good - wife helps him with his medications.  Patient understands to avoid NSAIDs. Patient understands to avoid decongestants.    Pertinent Lab Values: 09/12/23: Serum creatinine 2.05, BUN 29, Potassium 4.5, Sodium 139  Vital Signs: Weight: 424.8 lbs (last clinic weight: 423.3 lbs) Blood pressure: 144/92  Heart rate: 78   Assessment/Plan: Chronic combined systolic and diastolic heart failure - Likely related to poorly controlled essential hypertension, 187/123 on admission. Not consistent with medications. No CP.  - LHC from 2013 normal coronaries. - Echo 05/2022: EF 45-50%, LV w wall abnormalities, grade I diastolic dysfunction, aortic root dilation noted 40 mm. - Echo 07/2023: EF 25-30% global HK, LV severely dilated, grade III diastolic dysfunction, RV nl, LA dilated, small pericardial effusion, no aortic dilation. RA ~8.   - NYHA II, volume difficult due to body habitus but does not appear volume up. -  Continue Lasix 40 mg M, W, F + 20 KCL MWF - Continue carvedilol 25 mg BID - Continue Entresto 49/51 mg BID - Continue spironolactone 25 mg daily - Restart Jardiance 10 mg daily.  - Increase hydralazine to 100 mg TID and increase Isordil to 20 mg TID - Repeat echo in 3 months. If EF not improved, consider CMR and R/LHC -Has  been referred to Cardiac Rehab   2. Essential Hypertension - Previously uncontrolled - BP better today but still elevated - Increase hydralazine and Isordil as above   3. CKD IIIa - SCr baseline ~1.6 - Continue SGLT2i   4. Severe OSA/Nocturnal Hypoxemia - CPAP at night - No change   5. Obesity - Body mass index is 59.02 kg/m. - s/p gastric sleeve in 2013 - Previously on phentermine (stopped in May) - Has been referred to pharmacy clinic for semaglutide vs tirzepatide, benefits investigation pending   6. Iron deficiency Anemia - Ferritin 22, T sat 8 - Received IV iron   7. HLD  - LDL 78 (10/24) - Continue atorvastatin 40 mg daily  Follow up 1 month with Dr. Belinda Block, PharmD, BCPS, [facility], CPP Heart Failure Clinic Pharmacist [phone]

## 2023-09-12 ENCOUNTER — Ambulatory Visit (HOSPITAL_COMMUNITY)
Admission: RE | Admit: 2023-09-12 | Discharge: 2023-09-12 | Disposition: A | Discharge status: Home / Self Care | Payer: 59 | Source: Ambulatory Visit | Attending: Cardiology | Admitting: Cardiology

## 2023-09-12 DIAGNOSIS — I5022 Chronic systolic (congestive) heart failure: Secondary | ICD-10-CM | POA: Diagnosis present

## 2023-09-12 DIAGNOSIS — Z79899 Other long term (current) drug therapy: Secondary | ICD-10-CM | POA: Insufficient documentation

## 2023-09-12 LAB — BASIC METABOLIC PANEL
Anion gap: 8 (ref 5–15)
BUN: 29 mg/dL — ABNORMAL HIGH (ref 6–20)
CO2: 23 mmol/L (ref 22–32)
Calcium: 9.3 mg/dL (ref 8.9–10.3)
Chloride: 108 mmol/L (ref 98–111)
Creatinine, Ser: 2.05 mg/dL — ABNORMAL HIGH (ref 0.61–1.24)
GFR, Estimated: 41 mL/min — ABNORMAL LOW (ref >60)
Glucose, Bld: 97 mg/dL (ref 70–99)
Potassium: 4.5 mmol/L (ref 3.5–5.1)
Sodium: 139 mmol/L (ref 135–145)

## 2023-09-13 ENCOUNTER — Telehealth (HOSPITAL_COMMUNITY): Payer: Self-pay

## 2023-09-13 NOTE — Telephone Encounter (Signed)
No response from in regards to Cardiac Rehab  Closed referral

## 2023-09-19 ENCOUNTER — Telehealth: Payer: Self-pay | Admitting: Pharmacy Technician

## 2023-09-19 ENCOUNTER — Ambulatory Visit (HOSPITAL_COMMUNITY)
Admission: RE | Admit: 2023-09-19 | Discharge: 2023-09-19 | Disposition: A | Discharge status: Home / Self Care | Payer: 59 | Source: Ambulatory Visit | Attending: Cardiology | Admitting: Cardiology

## 2023-09-19 ENCOUNTER — Other Ambulatory Visit (HOSPITAL_COMMUNITY): Payer: Self-pay

## 2023-09-19 VITALS — BP 144/92 | HR 78 | Wt >= 6400 oz

## 2023-09-19 DIAGNOSIS — I5042 Chronic combined systolic (congestive) and diastolic (congestive) heart failure: Secondary | ICD-10-CM | POA: Insufficient documentation

## 2023-09-19 DIAGNOSIS — I5032 Chronic diastolic (congestive) heart failure: Secondary | ICD-10-CM | POA: Diagnosis present

## 2023-09-19 DIAGNOSIS — D509 Iron deficiency anemia, unspecified: Secondary | ICD-10-CM | POA: Diagnosis not present

## 2023-09-19 DIAGNOSIS — I130 Hypertensive heart and chronic kidney disease with heart failure and stage 1 through stage 4 chronic kidney disease, or unspecified chronic kidney disease: Secondary | ICD-10-CM | POA: Insufficient documentation

## 2023-09-19 DIAGNOSIS — R9431 Abnormal electrocardiogram [ECG] [EKG]: Secondary | ICD-10-CM | POA: Insufficient documentation

## 2023-09-19 DIAGNOSIS — Z9884 Bariatric surgery status: Secondary | ICD-10-CM | POA: Diagnosis not present

## 2023-09-19 DIAGNOSIS — N1831 Chronic kidney disease, stage 3a: Secondary | ICD-10-CM | POA: Insufficient documentation

## 2023-09-19 DIAGNOSIS — G4733 Obstructive sleep apnea (adult) (pediatric): Secondary | ICD-10-CM | POA: Diagnosis not present

## 2023-09-19 DIAGNOSIS — F419 Anxiety disorder, unspecified: Secondary | ICD-10-CM | POA: Insufficient documentation

## 2023-09-19 DIAGNOSIS — Z6841 Body Mass Index (BMI) 40.0 and over, adult: Secondary | ICD-10-CM | POA: Insufficient documentation

## 2023-09-19 DIAGNOSIS — M109 Gout, unspecified: Secondary | ICD-10-CM | POA: Insufficient documentation

## 2023-09-19 DIAGNOSIS — E669 Obesity, unspecified: Secondary | ICD-10-CM | POA: Diagnosis not present

## 2023-09-19 DIAGNOSIS — J45909 Unspecified asthma, uncomplicated: Secondary | ICD-10-CM | POA: Insufficient documentation

## 2023-09-19 DIAGNOSIS — E785 Hyperlipidemia, unspecified: Secondary | ICD-10-CM | POA: Diagnosis not present

## 2023-09-19 MED ORDER — ISOSORBIDE DINITRATE 20 MG PO TABS: 20.0000 mg | ORAL_TABLET | Freq: Three times a day (TID) | ORAL | 6 refills | Status: DC

## 2023-09-19 MED ORDER — HYDRALAZINE HCL 100 MG PO TABS: 100.0000 mg | ORAL_TABLET | Freq: Three times a day (TID) | ORAL | 6 refills | Status: AC

## 2023-09-19 MED ORDER — EMPAGLIFLOZIN 10 MG PO TABS: 10.0000 mg | ORAL_TABLET | Freq: Every day | ORAL | 6 refills | Status: DC

## 2023-09-19 MED ORDER — POTASSIUM CHLORIDE CRYS ER 20 MEQ PO TBCR: 20.0000 meq | EXTENDED_RELEASE_TABLET | ORAL | 6 refills | Status: AC

## 2023-09-19 NOTE — Telephone Encounter (Signed)
Pharmacy Patient Advocate Encounter  Received notification from [facility] that Prior Authorization for zepbound has been DENIED.  Full denial letter will be uploaded to the media tab. See denial reason below.   PA #/Case ID/Reference #: [id number]

## 2023-09-19 NOTE — Telephone Encounter (Signed)
Pharmacy Patient Advocate Encounter  Received notification from [facility] that Prior Authorization for wegovy has been DENIED.  Full denial letter will be uploaded to the media tab. See denial reason below.   PA #/Case ID/Reference #: [id number]

## 2023-09-19 NOTE — Telephone Encounter (Signed)
-----   Message from Olene Floss sent at 09/19/2023 11:10 AM EST ----- Sometimes they put a referral in and sometimes they just send a staff message. I honestly don't remember it coming across as a staff message.   Rx team- can you see if his insurance will pay for [facility] or zepbound for weight loss. ----- Message ----- From: Evon Slack, RPH-CPP Sent: 09/19/2023   8:06 AM EST To: Olene Floss, RPH-CPP  Hey Melissa!  I have a patient coming at [facility] today for medication titration. He called last week because he was interested in getting started on a GLP1 and it did not look like the referral had ever been placed. I asked Jasmine to place it last week after they  called on Thursday. I am still not seeing the referral or that anyone has ever reached out to him about this. Maybe it is in process, but I wanted to have an answer for them when they come later this afternoon.   How can I help facilitate this process? I am happy to place any order you need. He is >400 lbs, so I think this could really be beneficial if his insurance will allow.   Thank you!  Leotis Shames

## 2023-09-19 NOTE — Telephone Encounter (Signed)
Pharmacy Patient Advocate Encounter   Received notification from Pt Calls Messages that prior authorization for zepbound is required/requested.   Insurance verification completed.   The patient is insured through [facility] .   Per test claim: PA required; PA submitted to above mentioned insurance via CoverMyMeds Key/confirmation #/EOC [id number] Status is pending

## 2023-09-19 NOTE — Telephone Encounter (Addendum)
Pharmacy Patient Advocate Encounter   Received notification from Physician's Office that prior authorization for wegovy is required/requested.   Insurance verification completed.   The patient is insured through [facility] .   Per test claim: PA required; PA submitted to above mentioned insurance via CoverMyMeds Key/confirmation #/EOC [facility] Status is pending

## 2023-09-19 NOTE — Patient Instructions (Signed)
It was a pleasure seeing you today!  MEDICATIONS: -We are changing your medications today -Restart Jardiance 10 mg (1 tablet) daily -Increase hydralazine to 100 mg (1 tablet) three times daily. You may take two tablets of the 50 mg strength three times daily until you pick up the new strength.  -Increase isosorbide dinitrate to 20 mg (1 tablet) three times daily. You may take two tablets of the 10 mg strength three times daily until you pick up the new strength.  -Call if you have questions about your medications.   NEXT APPOINTMENT: Return to clinic in 1 month with Dr. Gasper Lloyd.  In general, to take care of your heart failure: -Limit your fluid intake to 2 Liters (half-gallon) per day.   -Limit your salt intake to ideally 2-3 grams (2000-3000 mg) per day. -Weigh yourself daily and record, and bring that "weight diary" to your next appointment.  (Weight gain of 2-3 pounds in 1 day typically means fluid weight.) -The medications for your heart are to help your heart and help you live longer.   -Please contact us before stopping any of your heart medications.  Call the clinic at [phone] with questions or to reschedule future appointments.

## 2023-09-20 NOTE — Telephone Encounter (Signed)
Left detailed message per DPR of denial

## 2023-09-21 ENCOUNTER — Encounter: Payer: Self-pay | Admitting: Family Medicine

## 2023-09-21 ENCOUNTER — Ambulatory Visit (INDEPENDENT_AMBULATORY_CARE_PROVIDER_SITE_OTHER): Payer: 59 | Admitting: Family Medicine

## 2023-09-21 VITALS — BP 161/114 | HR 89 | Temp 98.7°F | Resp 20 | Ht 72.0 in | Wt >= 6400 oz

## 2023-09-21 DIAGNOSIS — F419 Anxiety disorder, unspecified: Secondary | ICD-10-CM | POA: Diagnosis not present

## 2023-09-21 DIAGNOSIS — Z6841 Body Mass Index (BMI) 40.0 and over, adult: Secondary | ICD-10-CM | POA: Diagnosis not present

## 2023-09-21 DIAGNOSIS — I10 Essential (primary) hypertension: Secondary | ICD-10-CM

## 2023-09-21 DIAGNOSIS — F32A Depression, unspecified: Secondary | ICD-10-CM

## 2023-09-21 DIAGNOSIS — E66813 Obesity, class 3: Secondary | ICD-10-CM | POA: Diagnosis not present

## 2023-09-21 MED ORDER — TIRZEPATIDE 2.5 MG/0.5ML ~~LOC~~ SOAJ: 2.5000 mg | SUBCUTANEOUS | 0 refills | Status: DC

## 2023-09-23 ENCOUNTER — Encounter: Payer: Self-pay | Admitting: Family Medicine

## 2023-09-23 ENCOUNTER — Other Ambulatory Visit: Payer: Self-pay

## 2023-09-23 NOTE — Progress Notes (Signed)
Established Patient Office Visit  Subjective    Patient ID: Leonard Lewis, male    DOB: 17-Jan-1981  Age: 42 y.o. MRN: [id number]  CC:  Chief Complaint  Patient presents with   Follow-up    6 weeks    HPI Leonard Lewis presents for follow up of chronic med issues including anxiety/depression and hypertension. Patient reports that he has been trying to improve his daily compliance with his meds and he denies acute complaints.   Outpatient Encounter Medications as of 09/21/2023  Medication Sig   albuterol (VENTOLIN HFA) 108 (90 Base) MCG/ACT inhaler Inhale 2 puffs into the lungs every 6 (six) hours as needed for wheezing or shortness of breath.   allopurinol (ZYLOPRIM) 100 MG tablet TAKE 1 TABLET BY MOUTH EVERY DAY   aspirin EC 81 MG tablet Take 1 tablet (81 mg total) by mouth daily. Swallow whole.   atorvastatin (LIPITOR) 40 MG tablet Take 1 tablet (40 mg total) by mouth daily.   carvedilol (COREG) 25 MG tablet Take 1 tablet (25 mg total) by mouth 2 (two) times daily.   colchicine 0.6 MG tablet Take 1.2 mg ( 2 tablets)  by mouth  as a single dose at the first sign of gout flare, followed by 0.6 mg ( 1 tablet)  BY MOUTH 1 hour later, then 0.6 mg ( 1 tablet) daily for 6 days (Patient taking differently: Take 1.2 mg ( 2 tablets)  by mouth  as a single dose at the first sign of gout flare, followed by 0.6 mg ( 1 tablet)  BY MOUTH 1 hour later, then 0.6 mg ( 1 tablet) daily for 6 days as needed)   diclofenac Sodium (VOLTAREN) 1 % GEL Apply 2 g topically 4 (four) times daily. Apply to right knee.   empagliflozin (JARDIANCE) 10 MG TABS tablet Take 1 tablet (10 mg total) by mouth daily.   furosemide (LASIX) 40 MG tablet Take 1 tablet (40 mg total) by mouth every Monday, Wednesday, and Friday. Take extra dose (including Tuesday, Thursday, Saturday or Sunday) in case of weight gain 2 to 3 lbs in 24 hrs or 5 lbs in 7 days, until weight back to baseline.   hydrALAZINE (APRESOLINE) 100 MG tablet  Take 1 tablet (100 mg total) by mouth 3 (three) times daily.   isosorbide dinitrate (ISORDIL) 20 MG tablet Take 1 tablet (20 mg total) by mouth 3 (three) times daily.   metFORMIN (GLUCOPHAGE) 500 MG tablet Take 1 tablet (500 mg total) by mouth daily with breakfast.   potassium chloride SA (KLOR-CON M) 20 MEQ tablet Take 1 tablet (20 mEq total) by mouth every Monday, Wednesday, and Friday. Take one tablet when taking extra tablet of furosemide.   Respiratory Therapy Supplies (CARETOUCH CPAP MASK WIPES) MISC For cpap machine   sacubitril-valsartan (ENTRESTO) 49-51 MG Take 1 tablet by mouth 2 (two) times daily.   sertraline (ZOLOFT) 100 MG tablet Take 1 tablet (100 mg total) by mouth daily.   spironolactone (ALDACTONE) 25 MG tablet Take 1 tablet (25 mg total) by mouth daily.   tirzepatide [facility]) 2.5 MG/0.5ML Pen Inject 2.5 mg into the skin once a week.   No facility-administered encounter medications on file as of 09/21/2023.    Past Medical History:  Diagnosis Date   Arthritis    Asthma    Back pain    CHF (congestive heart failure) (HCC) 2008   EF 40-50%         Dyslipidemia  Fatty liver    Hypertension    Leg edema    Obesity, morbid (HCC)    Obstructive sleep apnea    Prediabetes     Past Surgical History:  Procedure Laterality Date   CARDIAC CATHETERIZATION  Oct 2013   cardiac cath negative for obstructive disease -- did show end diastolic pressure secondary to systemic hypertension   LAPAROSCOPIC GASTRIC SLEEVE RESECTION N/A 01/27/2015   Procedure: LAPAROSCOPIC GASTRIC SLEEVE RESECTION WITH UPPER ENDOSCOPY;  Surgeon: Luretha Murphy, MD;  Location: [facility] ORS;  Service: General;  Laterality: N/A;   LEFT HEART CATHETERIZATION WITH CORONARY ANGIOGRAM N/A 08/03/2012   Procedure: LEFT HEART CATHETERIZATION WITH CORONARY ANGIOGRAM;  Surgeon: Tonny Bollman, MD;  Location: [facility] CATH LAB;  Service: Cardiovascular;  Laterality: N/A;   NO PAST SURGERIES      Family History  Problem  Relation Age of Onset   Hypertension Mother    Obesity Mother    Hypertension Father     Social History   Socioeconomic History   Marital status: Married    Spouse name: Rickeda   Number of children: 2   Years of education: Not on file   Highest education level: Not on file  Occupational History   Occupation: Art gallery manager   Occupation: Training and development officer  Tobacco Use   Smoking status: Former    Current packs/day: 0.00    Types: Cigarettes    Quit date: 10/04/2006    Years since quitting: 16.9   Smokeless tobacco: Never  Vaping Use   Vaping status: Never Used  Substance and Sexual Activity   Alcohol use: No   Drug use: No   Sexual activity: Not on file  Other Topics Concern   Not on file  Social History Narrative   Employed:  Services ATM machines.    Social Drivers of Health   Financial Resource Strain: Medium Risk (08/01/2023)   Overall Financial Resource Strain (CARDIA)    Difficulty of Paying Living Expenses: Somewhat hard  Food Insecurity: No Food Insecurity (07/31/2023)   Hunger Vital Sign    Worried About Running Out of Food in the Last Year: Never true    Ran Out of Food in the Last Year: Never true  Transportation Needs: No Transportation Needs (07/31/2023)   PRAPARE - Administrator, Civil Service (Medical): No    Lack of Transportation (Non-Medical): No  Physical Activity: Inactive (08/10/2023)   Exercise Vital Sign    Days of Exercise per Week: 0 days    Minutes of Exercise per Session: 0 min  Stress: No Stress Concern Present (08/10/2023)   [employer] of Occupational Health - Occupational Stress Questionnaire    Feeling of Stress : Only a little  Social Connections: Unknown (08/10/2023)   Social Connection and Isolation Panel [NHANES]    Frequency of Communication with Friends and Family: Twice a week    Frequency of Social Gatherings with Friends and Family: Not on file    Attends Religious Services: 1 to 4 times per year    Active  Member of [employer] or Organizations: No    Attends Banker Meetings: Never    Marital Status: Married  Catering manager Violence: Not At Risk (07/31/2023)   Humiliation, Afraid, Rape, and Kick questionnaire    Fear of Current or Ex-Partner: No    Emotionally Abused: No    Physically Abused: No    Sexually Abused: No    Review of Systems  All other systems reviewed and are  negative.       Objective    BP (!) 161/114 (BP Location: Left Arm, Patient Position: Sitting, Cuff Size: Normal)   Pulse 89   Temp 98.7 F (37.1 C) (Oral)   Resp 20   Ht 6' (1.829 m)   Wt (!) 421 lb 6.4 oz (191.1 kg)   SpO2 93%   BMI 57.15 kg/m   Physical Exam Vitals and nursing note reviewed.  Constitutional:      General: He is not in acute distress.    Appearance: He is obese.  Cardiovascular:     Rate and Rhythm: Normal rate and regular rhythm.  Pulmonary:     Effort: Pulmonary effort is normal. No respiratory distress.     Breath sounds: Normal breath sounds. No wheezing.  Abdominal:     Palpations: Abdomen is soft.     Tenderness: There is no abdominal tenderness.  Neurological:     General: No focal deficit present.     Mental Status: He is alert and oriented to person, place, and time.  Psychiatric:        Mood and Affect: Mood and affect normal.        Speech: Speech normal.        Behavior: Behavior normal. Behavior is cooperative.         Assessment & Plan:  1. Uncontrolled hypertension (Primary) Management as per consultant. Keep upcoming scheduled appt  2. Class 3 severe obesity due to excess calories with serious comorbidity and body mass index (BMI) of 50.0 to 59.9 in adult [facility]) Mounjaro prescribed.   3. Anxiety and depression Continue meds. Discussed compliance.   Return in about 4 weeks (around 10/19/2023).   Tommie Raymond, MD

## 2023-10-13 ENCOUNTER — Other Ambulatory Visit: Payer: Self-pay | Admitting: Family Medicine

## 2023-10-13 MED ORDER — CLONAZEPAM 0.5 MG PO TABS: 0.5000 mg | ORAL_TABLET | Freq: Two times a day (BID) | ORAL | 1 refills | Status: AC | PRN

## 2023-10-27 ENCOUNTER — Ambulatory Visit (HOSPITAL_COMMUNITY)
Admission: RE | Admit: 2023-10-27 | Discharge: 2023-10-27 | Disposition: A | Discharge status: Home / Self Care | Payer: 59 | Source: Ambulatory Visit | Attending: Cardiology | Admitting: Cardiology

## 2023-10-27 ENCOUNTER — Encounter (HOSPITAL_COMMUNITY): Payer: Self-pay | Admitting: Cardiology

## 2023-10-27 VITALS — BP 128/82 | HR 97 | Ht 72.0 in | Wt >= 6400 oz

## 2023-10-27 DIAGNOSIS — E785 Hyperlipidemia, unspecified: Secondary | ICD-10-CM | POA: Diagnosis not present

## 2023-10-27 DIAGNOSIS — J45909 Unspecified asthma, uncomplicated: Secondary | ICD-10-CM | POA: Insufficient documentation

## 2023-10-27 DIAGNOSIS — G4733 Obstructive sleep apnea (adult) (pediatric): Secondary | ICD-10-CM | POA: Insufficient documentation

## 2023-10-27 DIAGNOSIS — I5042 Chronic combined systolic (congestive) and diastolic (congestive) heart failure: Secondary | ICD-10-CM | POA: Diagnosis not present

## 2023-10-27 DIAGNOSIS — I130 Hypertensive heart and chronic kidney disease with heart failure and stage 1 through stage 4 chronic kidney disease, or unspecified chronic kidney disease: Secondary | ICD-10-CM | POA: Diagnosis not present

## 2023-10-27 DIAGNOSIS — N1831 Chronic kidney disease, stage 3a: Secondary | ICD-10-CM | POA: Diagnosis not present

## 2023-10-27 DIAGNOSIS — I5022 Chronic systolic (congestive) heart failure: Secondary | ICD-10-CM | POA: Insufficient documentation

## 2023-10-27 DIAGNOSIS — M109 Gout, unspecified: Secondary | ICD-10-CM | POA: Diagnosis not present

## 2023-10-27 DIAGNOSIS — Z6841 Body Mass Index (BMI) 40.0 and over, adult: Secondary | ICD-10-CM | POA: Insufficient documentation

## 2023-10-27 DIAGNOSIS — I1A0 Resistant hypertension: Secondary | ICD-10-CM

## 2023-10-27 LAB — BASIC METABOLIC PANEL
Anion gap: 11 (ref 5–15)
BUN: 31 mg/dL — ABNORMAL HIGH (ref 6–20)
CO2: 23 mmol/L (ref 22–32)
Calcium: 9 mg/dL (ref 8.9–10.3)
Chloride: 102 mmol/L (ref 98–111)
Creatinine, Ser: 2.19 mg/dL — ABNORMAL HIGH (ref 0.61–1.24)
GFR, Estimated: 37 mL/min — ABNORMAL LOW (ref >60)
Glucose, Bld: 98 mg/dL (ref 70–99)
Potassium: 4.6 mmol/L (ref 3.5–5.1)
Sodium: 136 mmol/L (ref 135–145)

## 2023-10-27 LAB — BRAIN NATRIURETIC PEPTIDE: B Natriuretic Peptide: 44.7 pg/mL (ref 0.0–100.0)

## 2023-10-27 MED ORDER — ENTRESTO 97-103 MG PO TABS: ORAL_TABLET | ORAL | 3 refills | Status: DC

## 2023-10-27 NOTE — Progress Notes (Signed)
ADVANCED HEART FAILURE CLINIC NOTE  Referring Physician: Georganna Skeans, MD  Primary Care: Georganna Skeans, MD Primary Cardiologist:  CC: Systolic heart failure  HPI: Leonard Lewis is a 43 y.o. male with heart failure with reduced ejection fraction, stage IIIa CKD, hypertension, hyperlipidemia, asthma, gout, obstructive sleep apnea, morbid obesity presenting today to establish care.  His history of heart failure dates back to at least August 2023 when he is admitted for chest pain and shortness of breath secondary to sepsis from community-acquired pneumonia.  TTE at that time with LVEF of 40 to 50%.  Since that time has had multiple ER admissions for otitis media and frequent gout flares.  In October 2024 he was admitted for acute on chronic heart failure and hypertensive urgency due to medication noncompliance.  Echocardiogram at that time with LVEF of 25 to 30%.  He was discharged home with a weight of 416 pounds.  Since that time he has been seen by [facility] where GDMT was further uptitrated.  Today he presents to establish care.  From a functional standpoint he has been doing fairly well.  He reports compliance with all medications and is attempting to lose weight.  No recent heart failure exacerbations.  Activity level/exercise tolerance: NYHA III, limited by weight Orthopnea:  Sleeps on 2 pillows Paroxysmal noctural dyspnea:  no Chest pain/pressure:  no Orthostatic lightheadedness:  no Palpitations:  no Lower extremity edema:  no Presyncope/syncope:  no Cough:  no  Past Medical History:  Diagnosis Date   Arthritis    Asthma    Back pain    CHF (congestive heart failure) (HCC) 2008   EF 40-50%         Dyslipidemia    Fatty liver    Hypertension    Leg edema    Obesity, morbid (HCC)    Obstructive sleep apnea    Prediabetes     Current Outpatient Medications  Medication Sig Dispense Refill   albuterol (VENTOLIN HFA) 108 (90 Base) MCG/ACT inhaler Inhale 2  puffs into the lungs every 6 (six) hours as needed for wheezing or shortness of breath. 8 g 0   allopurinol (ZYLOPRIM) 100 MG tablet TAKE 1 TABLET BY MOUTH EVERY DAY 30 tablet 2   aspirin EC 81 MG tablet Take 1 tablet (81 mg total) by mouth daily. Swallow whole. 30 tablet 6   atorvastatin (LIPITOR) 40 MG tablet Take 1 tablet (40 mg total) by mouth daily. 30 tablet 6   carvedilol (COREG) 25 MG tablet Take 1 tablet (25 mg total) by mouth 2 (two) times daily. 60 tablet 6   clonazePAM (KLONOPIN) 0.5 MG tablet Take 1 tablet (0.5 mg total) by mouth 2 (two) times daily as needed for anxiety. 20 tablet 1   colchicine 0.6 MG tablet Take 1.2 mg ( 2 tablets)  by mouth  as a single dose at the first sign of gout flare, followed by 0.6 mg ( 1 tablet)  BY MOUTH 1 hour later, then 0.6 mg ( 1 tablet) daily for 6 days (Patient taking differently: Take 1.2 mg ( 2 tablets)  by mouth  as a single dose at the first sign of gout flare, followed by 0.6 mg ( 1 tablet)  BY MOUTH 1 hour later, then 0.6 mg ( 1 tablet) daily for 6 days as needed) 30 tablet 0   diclofenac Sodium (VOLTAREN) 1 % GEL Apply 2 g topically 4 (four) times daily. Apply to right knee. 50 g 0  empagliflozin (JARDIANCE) 10 MG TABS tablet Take 1 tablet (10 mg total) by mouth daily. 30 tablet 6   furosemide (LASIX) 40 MG tablet Take 1 tablet (40 mg total) by mouth every Monday, Wednesday, and Friday. Take extra dose (including Tuesday, Thursday, Saturday or Sunday) in case of weight gain 2 to 3 lbs in 24 hrs or 5 lbs in 7 days, until weight back to baseline. 30 tablet 6   hydrALAZINE (APRESOLINE) 100 MG tablet Take 1 tablet (100 mg total) by mouth 3 (three) times daily. 90 tablet 6   isosorbide dinitrate (ISORDIL) 20 MG tablet Take 1 tablet (20 mg total) by mouth 3 (three) times daily. 90 tablet 6   metFORMIN (GLUCOPHAGE) 500 MG tablet Take 1 tablet (500 mg total) by mouth daily with breakfast. 90 tablet 0   potassium chloride SA (KLOR-CON M) 20 MEQ tablet  Take 1 tablet (20 mEq total) by mouth every Monday, Wednesday, and Friday. Take one tablet when taking extra tablet of furosemide. 15 tablet 6   Respiratory Therapy Supplies (CARETOUCH CPAP MASK WIPES) MISC For cpap machine 1 each 3   sacubitril-valsartan (ENTRESTO) 97-103 MG TAKE 1 TABLET (97/103)  MG IN THE MORNING AND 1/2 TABLET (49/51) MG IN THE EVENING 60 tablet 3   sertraline (ZOLOFT) 100 MG tablet Take 1 tablet (100 mg total) by mouth daily. 90 tablet 1   spironolactone (ALDACTONE) 25 MG tablet Take 1 tablet (25 mg total) by mouth daily. 30 tablet 6   tirzepatide (MOUNJARO) 2.5 MG/0.5ML Pen Inject 2.5 mg into the skin once a week. 2 mL 0   No current facility-administered medications for this encounter.    No Known Allergies    Social History   Socioeconomic History   Marital status: Married    Spouse name: Leonard Lewis   Number of children: 2   Years of education: Not on file   Highest education level: Not on file  Occupational History   Occupation: Art gallery manager   Occupation: [employer]  Tobacco Use   Smoking status: Former    Current packs/day: 0.00    Types: Cigarettes    Quit date: 10/04/2006    Years since quitting: 17.0   Smokeless tobacco: Never  Vaping Use   Vaping status: Never Used  Substance and Sexual Activity   Alcohol use: No   Drug use: No   Sexual activity: Not on file  Other Topics Concern   Not on file  Social History Narrative   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    Social Drivers of Health   Financial Resource Strain: Medium Risk (08/01/2023)   Overall Financial Resource Strain (CARDIA)    Difficulty of Paying Living Expenses: Somewhat hard  Food Insecurity: No Food Insecurity (07/31/2023)   Hunger Vital Sign    Worried About Running Out of Food in the Last Year: Never true    Ran Out of Food in the Last Year: Never true  Transportation Needs: No Transportation Needs (07/31/2023)   PRAPARE - Administrator, Civil Service  (Medical): No    Lack of Transportation (Non-Medical): No  Physical Activity: Inactive (08/10/2023)   Exercise Vital Sign    Days of Exercise per Week: 0 days    Minutes of Exercise per Session: 0 min  Stress: No Stress Concern Present (08/10/2023)   [employer] of Occupational Health - Occupational Stress Questionnaire    Feeling of Stress : Only a little  Social Connections: Unknown (08/10/2023)   Social Connection  and Isolation Panel [NHANES]    Frequency of Communication with Friends and Family: Twice a week    Frequency of Social Gatherings with Friends and Family: Not on file    Attends Religious Services: 1 to 4 times per year    Active Member of [employer] or Organizations: No    Attends Banker Meetings: Never    Marital Status: Married  Catering manager Violence: Not At Risk (07/31/2023)   Humiliation, Afraid, Rape, and Kick questionnaire    Fear of Current or Ex-Partner: No    Emotionally Abused: No    Physically Abused: No    Sexually Abused: No      Family History  Problem Relation Age of Onset   Hypertension Mother    Obesity Mother    Hypertension Father     PHYSICAL EXAM: Vitals:   10/27/23 1518  BP: 128/82  Pulse: 97  SpO2: 97%   GENERAL: Well nourished, well developed, and in no apparent distress at rest.  HEENT: There is no scleral icterus.  The mucous membranes are pink and moist.   CHEST: There are no chest wall deformities. There is no chest wall tenderness. Respirations are unlabored.  Lungs- CTA B/L CARDIAC:  JVP: difficult to assess due to body habitus          Normal rate with regular rhythm. no murmur.  Pulses are 2+ and symmetrical in upper and lower extremities. trace edema.  ABDOMEN: Soft, non-tender, non-distended. There are normal bowel sounds.  EXTREMITIES: Warm and well perfused.  NEUROLOGIC: Patient is oriented x3 with no obvious focal neurologic deficits.  PSYCH: Patients affect is appropriate SKIN: Warm and dry; no  lesions or wounds.    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  Will need ischemic evaluation if LVEF does not recover. NYHA class / AHA Stage: NYHA IIb-III, limited by weight Volume status & Diuretics: euvoelmic; continue lasix 40mg  MWF Vasodilators: Increase Entresto to 97/103 mg at night; 49/51mg  qAM due to some lightheadedness during the day. Hydral 100mg  TID, ISDN 20mg  TID.  Beta-Blocker:coreg 25mg  BID NFA:OZHYQMVHQIONGE 25mg  daily Cardiometabolic:jardiance 10mg  daily Devices therapies & Valvulopathies:not currently indicated; I suspect LVEF will improve after GDMT and med compliance.  Advanced therapies:not indicated.   2. HTN - see above. Not at goal.  - BMP/BNP today.  - Increase Entresto.   3. CKD IIIa - Repeat labs today - continue jardiance  4. Severe OSA - CPAP at night - reports compliance.   5. Obesity - Body mass index is 55.93 kg/m. - extensively discussed importance of weight loss.    Leonard Lewis Advanced Heart Failure Mechanical Circulatory Support

## 2023-10-27 NOTE — Patient Instructions (Addendum)
Medication Changes:  STOP TAKING COLCHICINE- ONLY TAKE FOR GOUT   INCREASE ENTRESTO TO 97/103MG  IN THE MORNING (1) TABLET AND 49/51MG  (0.5) TABLET IN THE EVENING   Lab Work:  Labs done today, your results will be available in MyChart, we will contact you for abnormal readings.  Testing/Procedures: ECHOCARDIOGRAM AS SCHEDULED   Follow-Up in: 2 MONTHS AS SCHEDULED   At the [facility], you and your health needs are our priority. We have a designated team specialized in the treatment of Heart Failure. This Care Team includes your primary Heart Failure Specialized Cardiologist (physician), Advanced Practice Providers (APPs- Physician Assistants and Nurse Practitioners), and Pharmacist who all work together to provide you with the care you need, when you need it.   You may see any of the following providers on your designated Care Team at your next follow up:  Dr. Arvilla Meres Dr. Marca Ancona Dr. Dorthula Nettles Dr. Theresia Bough Tonye Becket, NP Robbie Lis, [place] [facility] [place], [place] Brynda Peon, NP [place] Lee, NP Karle Plumber, PharmD   Please be sure to bring in all your medications bottles to every appointment.   Need to Contact us:  If you have any questions or concerns before your next appointment please send [place] a message through [place] or call our office at [phone].    TO LEAVE A MESSAGE FOR THE NURSE SELECT OPTION 2, PLEASE LEAVE A MESSAGE INCLUDING: YOUR NAME DATE OF BIRTH CALL BACK NUMBER REASON FOR CALL**this is important as we prioritize the call backs  YOU WILL RECEIVE A CALL BACK THE SAME DAY AS LONG AS YOU CALL BEFORE 4:00 PM

## 2023-11-04 ENCOUNTER — Encounter: Payer: Self-pay | Admitting: *Deleted

## 2023-11-04 ENCOUNTER — Ambulatory Visit (HOSPITAL_COMMUNITY)
Admission: RE | Admit: 2023-11-04 | Discharge: 2023-11-04 | Disposition: A | Discharge status: Home / Self Care | Payer: 59 | Source: Ambulatory Visit | Attending: Cardiology | Admitting: Cardiology

## 2023-11-04 DIAGNOSIS — E785 Hyperlipidemia, unspecified: Secondary | ICD-10-CM | POA: Diagnosis not present

## 2023-11-04 DIAGNOSIS — N184 Chronic kidney disease, stage 4 (severe): Secondary | ICD-10-CM | POA: Diagnosis not present

## 2023-11-04 DIAGNOSIS — I130 Hypertensive heart and chronic kidney disease with heart failure and stage 1 through stage 4 chronic kidney disease, or unspecified chronic kidney disease: Secondary | ICD-10-CM | POA: Insufficient documentation

## 2023-11-04 DIAGNOSIS — I5022 Chronic systolic (congestive) heart failure: Secondary | ICD-10-CM | POA: Insufficient documentation

## 2023-11-04 DIAGNOSIS — G4730 Sleep apnea, unspecified: Secondary | ICD-10-CM | POA: Insufficient documentation

## 2023-11-04 DIAGNOSIS — Z006 Encounter for examination for normal comparison and control in clinical research program: Secondary | ICD-10-CM

## 2023-11-04 LAB — ECHOCARDIOGRAM COMPLETE
AR max vel: 3.37 cm2
AV Peak grad: 6 mm[Hg]
Ao pk vel: 1.22 m/s
Area-P 1/2: 3.42 cm2
Calc EF: 35.2 %
Est EF: 40
S' Lateral: 4.2 cm
Single Plane A2C EF: 40.6 %
Single Plane A4C EF: 30.7 %

## 2023-11-04 NOTE — Progress Notes (Signed)
Echocardiogram 2D Echocardiogram has been performed.  Leonard Lewis 11/04/2023, 4:00 PM

## 2023-11-04 NOTE — Research (Signed)
SITE: [id number]     Subject #[id number]   Subprotocol: A  Inclusion Criteria  Patients who meet all of the following criteria are eligible for enrollment as study participants:  Yes No  Age > 44 years old X   Eligible to wear Holter Study X    Exclusion Criteria  Patients who meet any of these criteria are not eligible for enrollment as study participants: Yes No  1. Receiving any mechanical (respiratory or circulatory) or renal support therapy at Screening or during Visit #1.  X  2.  Any other conditions that in the opinion of the investigators are likely to prevent compliance with the study protocol or pose a safety concern if the subject participates in the study.  X  3. Poor tolerance, namely susceptible to severe skin allergies from ECG adhesive patch application.  X   Protocol: REV H                                     Residential Zip code*  [address] (First 3 digits ONLY)                                             [facility] Informed Consent   Subject Name: Leonard Lewis  Subject met inclusion and exclusion criteria.  The informed consent form, study requirements and expectations were reviewed with the subject. Subject had opportunity to read consent and questions and concerns were addressed prior to the signing of the consent form.  The subject verbalized understanding of the trial requirements.  The subject agreed to participate in the EF ACT trial and signed the informed consent at 1605 on 11/04/2023.  The informed consent was obtained prior to performance of any protocol-specific procedures for the subject.  A copy of the signed informed consent was given to the subject and a copy was placed in the subject's medical record.   Brunilda Payor          Current Outpatient Medications:    albuterol (VENTOLIN HFA) 108 (90 Base) MCG/ACT inhaler, Inhale 2 puffs into the lungs every 6 (six) hours as needed for wheezing or shortness of breath., Disp: 8 g, Rfl: 0   allopurinol (ZYLOPRIM) 100 MG  tablet, TAKE 1 TABLET BY MOUTH EVERY DAY, Disp: 30 tablet, Rfl: 2   aspirin EC 81 MG tablet, Take 1 tablet (81 mg total) by mouth daily. Swallow whole., Disp: 30 tablet, Rfl: 6   atorvastatin (LIPITOR) 40 MG tablet, Take 1 tablet (40 mg total) by mouth daily., Disp: 30 tablet, Rfl: 6   carvedilol (COREG) 25 MG tablet, Take 1 tablet (25 mg total) by mouth 2 (two) times daily., Disp: 60 tablet, Rfl: 6   clonazePAM (KLONOPIN) 0.5 MG tablet, Take 1 tablet (0.5 mg total) by mouth 2 (two) times daily as needed for anxiety., Disp: 20 tablet, Rfl: 1   colchicine 0.6 MG tablet, Take 1.2 mg ( 2 tablets)  by mouth  as a single dose at the first sign of gout flare, followed by 0.6 mg ( 1 tablet)  BY MOUTH 1 hour later, then 0.6 mg ( 1 tablet) daily for 6 days (Patient taking differently: Take 1.2 mg ( 2 tablets)  by mouth  as a single dose at the first sign of gout flare, followed by  0.6 mg ( 1 tablet)  BY MOUTH 1 hour later, then 0.6 mg ( 1 tablet) daily for 6 days as needed), Disp: 30 tablet, Rfl: 0   diclofenac Sodium (VOLTAREN) 1 % GEL, Apply 2 g topically 4 (four) times daily. Apply to right knee., Disp: 50 g, Rfl: 0   empagliflozin (JARDIANCE) 10 MG TABS tablet, Take 1 tablet (10 mg total) by mouth daily., Disp: 30 tablet, Rfl: 6   furosemide (LASIX) 40 MG tablet, Take 1 tablet (40 mg total) by mouth every Monday, Wednesday, and Friday. Take extra dose (including Tuesday, Thursday, Saturday or Sunday) in case of weight gain 2 to 3 lbs in 24 hrs or 5 lbs in 7 days, until weight back to baseline., Disp: 30 tablet, Rfl: 6   hydrALAZINE (APRESOLINE) 100 MG tablet, Take 1 tablet (100 mg total) by mouth 3 (three) times daily., Disp: 90 tablet, Rfl: 6   isosorbide dinitrate (ISORDIL) 20 MG tablet, Take 1 tablet (20 mg total) by mouth 3 (three) times daily., Disp: 90 tablet, Rfl: 6   metFORMIN (GLUCOPHAGE) 500 MG tablet, Take 1 tablet (500 mg total) by mouth daily with breakfast., Disp: 90 tablet, Rfl: 0   potassium  chloride SA (KLOR-CON M) 20 MEQ tablet, Take 1 tablet (20 mEq total) by mouth every Monday, Wednesday, and Friday. Take one tablet when taking extra tablet of furosemide., Disp: 15 tablet, Rfl: 6   Respiratory Therapy Supplies (CARETOUCH CPAP MASK WIPES) MISC, For cpap machine, Disp: 1 each, Rfl: 3   sacubitril-valsartan (ENTRESTO) 97-103 MG, TAKE 1 TABLET (97/103)  MG IN THE MORNING AND 1/2 TABLET (49/51) MG IN THE EVENING, Disp: 60 tablet, Rfl: 3   sertraline (ZOLOFT) 100 MG tablet, Take 1 tablet (100 mg total) by mouth daily., Disp: 90 tablet, Rfl: 1   spironolactone (ALDACTONE) 25 MG tablet, Take 1 tablet (25 mg total) by mouth daily., Disp: 30 tablet, Rfl: 6   tirzepatide (MOUNJARO) 2.5 MG/0.5ML Pen, Inject 2.5 mg into the skin once a week., Disp: 2 mL, Rfl: 0

## 2023-11-08 ENCOUNTER — Encounter (HOSPITAL_COMMUNITY): Payer: Self-pay

## 2023-11-23 ENCOUNTER — Other Ambulatory Visit: Payer: Self-pay | Admitting: Family Medicine

## 2023-12-08 ENCOUNTER — Encounter: Payer: Self-pay | Admitting: Family Medicine

## 2023-12-16 NOTE — Addendum Note (Signed)
 Encounter addended by: Howell Rucks, RDCS on: 12/16/2023 3:27 PM  Actions taken: Imaging Exam ended

## 2023-12-20 ENCOUNTER — Ambulatory Visit: Payer: 59 | Admitting: Family Medicine

## 2023-12-21 ENCOUNTER — Ambulatory Visit: Admitting: Family Medicine

## 2023-12-21 ENCOUNTER — Encounter (HOSPITAL_COMMUNITY): Payer: 59 | Admitting: Cardiology

## 2024-01-03 ENCOUNTER — Ambulatory Visit: Payer: Self-pay

## 2024-01-03 NOTE — Telephone Encounter (Signed)
 Chief Complaint: Headache Symptoms: Headache across forehead, slightly behind eyes Frequency: since yesterday Pertinent Negatives: Patient denies fever, chest pain, difficulty breathing, numbness/weakness in arms or legs, sore throat Disposition: [] ED /[] Urgent Care (no appt availability in office) / [] Appointment(In office/virtual)/ []  [facility]/ [x] Home Care/ [] Refused Recommended Disposition /[] [facility]/ []  Follow-up with PCP Additional Notes: Patient called to report a headache he has had since yesterday morning that is across his forehead and behind his eyes. Patient states he has nasal congestion as well. Patient states his BP was running mildly high at 150/90. Patient denies chest pain, difficulty breathing, numbness or weakness of arms or legs, fever, cough. Patient advised to try Coricidin HBP medication to help with congestion and sinus pressure. Patient advised to call back if symptoms worsen or BP elevates. Patient verbalized understanding.    Reason for Disposition  [1] Headache AND [2] part of viral illness AND [3] present < 72 hours  Answer Assessment - Initial Assessment Questions 1. LOCATION: "Where does it hurt?"      Across the forehead and behind eyes 2. ONSET: "When did the headache start?" (Minutes, hours or days)      Yesterday morning 3. PATTERN: "Does the pain come and go, or has it been constant since it started?"     Constant 4. SEVERITY: "How bad is the pain?" and "What does it keep you from doing?"  (e.g., Scale 1-10; mild, moderate, or severe)   - MILD (1-3): doesn't interfere with normal activities    - MODERATE (4-7): interferes with normal activities or awakens from sleep    - SEVERE (8-10): excruciating pain, unable to do any normal activities        7 5. RECURRENT SYMPTOM: "Have you ever had headaches before?" If Yes, ask: "When was the last time?" and "What happened that time?"      Patient is unsure 6. CAUSE: "What do you  think is causing the headache?"     No 7. MIGRAINE: "Have you been diagnosed with migraine headaches?" If Yes, ask: "Is this headache similar?"      No 8. HEAD INJURY: "Has there been any recent injury to the head?"      No 9. OTHER SYMPTOMS: "Do you have any other symptoms?" (fever, stiff neck, eye pain, sore throat, cold symptoms)     Pain behind eyes also  Protocols used: Headache-A-AH

## 2024-01-03 NOTE — Telephone Encounter (Signed)
 Summary: headache   Copied From CRM [id number]. Reason for Triage: headache       Called pt - left message to return our call.

## 2024-01-25 ENCOUNTER — Other Ambulatory Visit: Payer: Self-pay | Admitting: Family Medicine

## 2024-02-07 ENCOUNTER — Other Ambulatory Visit: Payer: Self-pay | Admitting: Family Medicine

## 2024-02-07 MED ORDER — TIRZEPATIDE 2.5 MG/0.5ML ~~LOC~~ SOAJ: 2.5000 mg | SUBCUTANEOUS | 0 refills | Status: AC

## 2024-03-02 ENCOUNTER — Telehealth (HOSPITAL_COMMUNITY): Payer: Self-pay | Admitting: Cardiology

## 2024-03-02 NOTE — Telephone Encounter (Signed)
 Called to confirm/remind patient of their appointment at the [facility] on 03/02/24 .   Appointment:   [] Confirmed  [x] Left mess   [] No answer/No voice mail  [] VM Full/unable to leave message  [] Phone not in service  Patient reminded to bring all medications and/or complete list.  Confirmed patient has transportation. Gave directions, instructed to utilize valet parking.

## 2024-03-04 NOTE — Progress Notes (Signed)
 ADVANCED HEART FAILURE CLINIC NOTE  Referring Physician: Abraham Abo, MD  Primary Care: Abraham Abo, MD Primary Cardiologist:  CC: Systolic heart failure  HPI: Leonard Lewis is a 43 y.o. male with heart failure with reduced ejection fraction, stage IIIa CKD, hypertension, hyperlipidemia, asthma, gout, obstructive sleep apnea, morbid obesity presenting today to establish care.  His history of heart failure dates back to at least August 2023 when he is admitted for chest pain and shortness of breath secondary to sepsis from community-acquired pneumonia.  TTE at that time with LVEF of 40 to 50%.  Since that time has had multiple ER admissions for otitis media and frequent gout flares.  In October 2024 he was admitted for acute on chronic heart failure and hypertensive urgency due to medication noncompliance.  Echocardiogram at that time with LVEF of 25 to 30%.  He was discharged home with a weight of 416 pounds.  Since that time he has been seen by [facility] where GDMT was further uptitrated.  - Spironolactone  increased to 100mg  BID by PCP. He is unsure why. Feels very weak after taking it.   Activity level/exercise tolerance: NYHA III, limited by weight Orthopnea:  Sleeps on 2 pillows Paroxysmal noctural dyspnea:  no Chest pain/pressure:  no Orthostatic lightheadedness:  no Palpitations:  no Lower extremity edema:  no Presyncope/syncope:  no Cough:  no  Current Outpatient Medications  Medication Sig Dispense Refill   albuterol  (VENTOLIN  HFA) 108 (90 Base) MCG/ACT inhaler Inhale 2 puffs into the lungs every 6 (six) hours as needed for wheezing or shortness of breath. 8 g 0   allopurinol  (ZYLOPRIM ) 100 MG tablet TAKE 1 TABLET BY MOUTH EVERY DAY 30 tablet 2   aspirin  EC 81 MG tablet Take 1 tablet (81 mg total) by mouth daily. Swallow whole. 30 tablet 6   atorvastatin  (LIPITOR) 40 MG tablet Take 1 tablet (40 mg total) by mouth daily. 30 tablet 6   carvedilol  (COREG ) 25 MG  tablet Take 1 tablet (25 mg total) by mouth 2 (two) times daily. 60 tablet 6   clonazePAM  (KLONOPIN ) 0.5 MG tablet Take 1 tablet (0.5 mg total) by mouth 2 (two) times daily as needed for anxiety. 20 tablet 1   colchicine  0.6 MG tablet Take 1.2 mg ( 2 tablets)  by mouth  as a single dose at the first sign of gout flare, followed by 0.6 mg ( 1 tablet)  BY MOUTH 1 hour later, then 0.6 mg ( 1 tablet) daily for 6 days (Patient taking differently: Take 1.2 mg ( 2 tablets)  by mouth  as a single dose at the first sign of gout flare, followed by 0.6 mg ( 1 tablet)  BY MOUTH 1 hour later, then 0.6 mg ( 1 tablet) daily for 6 days as needed) 30 tablet 0   empagliflozin  (JARDIANCE ) 10 MG TABS tablet Take 1 tablet (10 mg total) by mouth daily. 30 tablet 6   hydrALAZINE  (APRESOLINE ) 100 MG tablet Take 1 tablet (100 mg total) by mouth 3 (three) times daily. 90 tablet 6   isosorbide  dinitrate (ISORDIL ) 20 MG tablet Take 1 tablet (20 mg total) by mouth 3 (three) times daily. 90 tablet 6   potassium chloride  SA (KLOR-CON  M) 20 MEQ tablet Take 1 tablet (20 mEq total) by mouth every Monday, Wednesday, and Friday. Take one tablet when taking extra tablet of furosemide . 15 tablet 6   Respiratory Therapy Supplies (CARETOUCH CPAP MASK WIPES) MISC For cpap machine 1 each 3  sacubitril -valsartan  (ENTRESTO ) 49-51 MG Take 1 tablet by mouth 2 (two) times daily. 60 tablet 3   sertraline  (ZOLOFT ) 100 MG tablet Take 1 tablet (100 mg total) by mouth daily. 90 tablet 1   tirzepatide (MOUNJARO) 2.5 MG/0.5ML Pen Inject 2.5 mg into the skin once a week. 2 mL 0   diclofenac  Sodium (VOLTAREN ) 1 % GEL Apply 2 g topically 4 (four) times daily. Apply to right knee. (Patient not taking: Reported on 03/05/2024) 50 g 0   furosemide  (LASIX ) 40 MG tablet Take 1 tablet (40 mg total) by mouth every Monday, Wednesday, and Friday. Take extra dose (including Tuesday, Thursday, Saturday or Sunday) in case of weight gain 2 to 3 lbs in 24 hrs or 5 lbs in 7  days, until weight back to baseline. (Patient not taking: Reported on 03/05/2024) 30 tablet 6   metFORMIN  (GLUCOPHAGE ) 500 MG tablet Take 1 tablet (500 mg total) by mouth daily with breakfast. (Patient not taking: Reported on 03/05/2024) 90 tablet 0   No current facility-administered medications for this encounter.    PHYSICAL EXAM: Vitals:   03/05/24 1545  BP: 92/74  Pulse: 88  SpO2: 96%   GENERAL: NAD Lungs- normal work of breathing CARDIAC:  JVP: 5 cm          Normal rate with regular rhythm. no murmur.  Pulses 2+. no edema.  ABDOMEN: Soft, non-tender, non-distended.  EXTREMITIES: Warm and well perfused.  NEUROLOGIC: No obvious FND    DATA REVIEW  ECG: 09/19/23: NSR  As per my personal interpretation  ECHO: 11/04/23: LVEF 40%.  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  Will need ischemic evaluation if LVEF does not recover. NYHA class / AHA Stage: NYHA IIb-III, limited by weight Volume status & Diuretics: euvoelmic; continue lasix  40mg  MWF Vasodilators: Decrease Entresto  to 49/51mg  BID. Continue Hydral 100mg  TID, ISDN 20mg  TID.  Beta-Blocker:coreg  25mg  BID MRA:spironolactone  increased to 100mg  BID; will discontinue and repeat labs STAT. Feels very weak.  Cardiometabolic:jardiance  10mg  daily Devices therapies & Valvulopathies:not currently indicated; I suspect LVEF will improve after GDMT and med compliance.  Advanced therapies:not indicated.   2. HTN - see above. Not at goal.  - Hypotensive; decrease Entresto  as above. - - Repeat BMP/BNP  3. CKD IIIa - concerned about renal function and hyperkalemia in the setting of high dose spiro; will hold and repeat labs.  - continue jardiance   4. Severe OSA - CPAP at night - compliant.   5. Obesity - Body  mass index is 56.56 kg/m. - continued discussions on the importance of weight loss.   I spent 40 minutes caring for this patient today including face to face time, ordering and reviewing labs, reviewing records from Dr. Elvan Hamel, counseling on medication dosages, seeing the patient, documenting in the record, and arranging follow ups.  Rheta Hemmelgarn Advanced Heart Failure Mechanical Circulatory Support

## 2024-03-05 ENCOUNTER — Encounter (HOSPITAL_COMMUNITY): Payer: Self-pay | Admitting: Cardiology

## 2024-03-05 ENCOUNTER — Ambulatory Visit (HOSPITAL_COMMUNITY)
Admission: RE | Admit: 2024-03-05 | Discharge: 2024-03-05 | Disposition: A | Discharge status: Home / Self Care | Source: Ambulatory Visit | Attending: Cardiology | Admitting: Cardiology

## 2024-03-05 VITALS — BP 92/74 | HR 88 | Wt >= 6400 oz

## 2024-03-05 DIAGNOSIS — I5042 Chronic combined systolic (congestive) and diastolic (congestive) heart failure: Secondary | ICD-10-CM | POA: Insufficient documentation

## 2024-03-05 DIAGNOSIS — I10 Essential (primary) hypertension: Secondary | ICD-10-CM

## 2024-03-05 DIAGNOSIS — G4733 Obstructive sleep apnea (adult) (pediatric): Secondary | ICD-10-CM | POA: Diagnosis not present

## 2024-03-05 DIAGNOSIS — N1831 Chronic kidney disease, stage 3a: Secondary | ICD-10-CM

## 2024-03-05 LAB — BASIC METABOLIC PANEL WITH GFR
Anion gap: 10 (ref 5–15)
BUN: 23 mg/dL — ABNORMAL HIGH (ref 6–20)
CO2: 21 mmol/L — ABNORMAL LOW (ref 22–32)
Calcium: 9.3 mg/dL (ref 8.9–10.3)
Chloride: 107 mmol/L (ref 98–111)
Creatinine, Ser: 2.4 mg/dL — ABNORMAL HIGH (ref 0.61–1.24)
GFR, Estimated: 33 mL/min — ABNORMAL LOW (ref >60)
Glucose, Bld: 118 mg/dL — ABNORMAL HIGH (ref 70–99)
Potassium: 4.9 mmol/L (ref 3.5–5.1)
Sodium: 138 mmol/L (ref 135–145)

## 2024-03-05 LAB — BRAIN NATRIURETIC PEPTIDE: B Natriuretic Peptide: 25.4 pg/mL (ref 0.0–100.0)

## 2024-03-05 MED ORDER — ENTRESTO 49-51 MG PO TABS: 1.0000 | ORAL_TABLET | Freq: Two times a day (BID) | ORAL | 3 refills | Status: AC

## 2024-03-05 NOTE — Patient Instructions (Signed)
 Medication Changes:  STOP SPIRONOLACTONE    DECREASE ENTRESTO  TO 49/51MG  TWICE DAILY   Lab Work:  Labs done today, your results will be available in MyChart, we will contact you for abnormal readings.  Follow-Up in: 3 WEEKS AS SCHEDULED   At the [facility], you and your health needs are our priority. We have a designated team specialized in the treatment of Heart Failure. This Care Team includes your primary Heart Failure Specialized Cardiologist (physician), Advanced Practice Providers (APPs- Physician Assistants and Nurse Practitioners), and Pharmacist who all work together to provide you with the care you need, when you need it.   You may see any of the following providers on your designated Care Team at your next follow up:  Dr. Jules Oar Dr. Peder Bourdon Dr. Alwin Baars Dr. Judyth Nunnery Nieves Bars, NP Ruddy Corral, [place] [facility] [place], [place] Dennise Fitz, NP [place] Lee, NP Luster Salters, PharmD   Please be sure to bring in all your medications bottles to every appointment.   Need to Contact Us :  If you have any questions or concerns before your next appointment please send us  a message through [place] or call our office at [phone].    TO LEAVE A MESSAGE FOR THE NURSE SELECT OPTION 2, PLEASE LEAVE A MESSAGE INCLUDING: YOUR NAME DATE OF BIRTH CALL BACK NUMBER REASON FOR CALL**this is important as we prioritize the call backs  YOU WILL RECEIVE A CALL BACK THE SAME DAY AS LONG AS YOU CALL BEFORE 4:00 PM

## 2024-03-08 ENCOUNTER — Other Ambulatory Visit: Payer: Self-pay | Admitting: Medical Genetics

## 2024-03-19 ENCOUNTER — Other Ambulatory Visit (HOSPITAL_COMMUNITY)

## 2024-03-23 ENCOUNTER — Other Ambulatory Visit (HOSPITAL_COMMUNITY)

## 2024-03-26 ENCOUNTER — Encounter (HOSPITAL_COMMUNITY)

## 2024-04-04 ENCOUNTER — Other Ambulatory Visit: Payer: Self-pay | Admitting: Family Medicine

## 2024-04-05 ENCOUNTER — Encounter: Payer: Self-pay | Admitting: Family Medicine

## 2024-04-05 ENCOUNTER — Ambulatory Visit (INDEPENDENT_AMBULATORY_CARE_PROVIDER_SITE_OTHER): Admitting: Family Medicine

## 2024-04-05 VITALS — BP 151/99 | HR 89 | Resp 16 | Ht 72.0 in | Wt >= 6400 oz

## 2024-04-05 DIAGNOSIS — E66813 Obesity, class 3: Secondary | ICD-10-CM | POA: Diagnosis not present

## 2024-04-05 DIAGNOSIS — F418 Other specified anxiety disorders: Secondary | ICD-10-CM

## 2024-04-05 DIAGNOSIS — Z23 Encounter for immunization: Secondary | ICD-10-CM

## 2024-04-05 DIAGNOSIS — Z6841 Body Mass Index (BMI) 40.0 and over, adult: Secondary | ICD-10-CM | POA: Diagnosis not present

## 2024-04-05 DIAGNOSIS — F32A Depression, unspecified: Secondary | ICD-10-CM

## 2024-04-05 DIAGNOSIS — G4733 Obstructive sleep apnea (adult) (pediatric): Secondary | ICD-10-CM

## 2024-04-05 DIAGNOSIS — I10 Essential (primary) hypertension: Secondary | ICD-10-CM | POA: Diagnosis not present

## 2024-04-05 NOTE — Progress Notes (Signed)
 Established Patient Office Visit  Subjective    Patient ID: Leonard Lewis, male    DOB: 06-Dec-1980  Age: 43 y.o. MRN: [id number]  CC: No chief complaint on file.   HPI CHAZ MCGLASSON presents for follow up of chronic med issues including hypertension. He reports that he was recently approved to start GLP-! And has taken his first dose. He also reports that he has not been using his cpap.   Outpatient Encounter Medications as of 04/05/2024  Medication Sig   albuterol  (VENTOLIN  HFA) 108 (90 Base) MCG/ACT inhaler Inhale 2 puffs into the lungs every 6 (six) hours as needed for wheezing or shortness of breath.   allopurinol  (ZYLOPRIM ) 100 MG tablet TAKE 1 TABLET BY MOUTH EVERY DAY   aspirin  EC 81 MG tablet Take 1 tablet (81 mg total) by mouth daily. Swallow whole.   atorvastatin  (LIPITOR) 40 MG tablet Take 1 tablet (40 mg total) by mouth daily.   carvedilol  (COREG ) 25 MG tablet Take 1 tablet (25 mg total) by mouth 2 (two) times daily.   clonazePAM  (KLONOPIN ) 0.5 MG tablet Take 1 tablet (0.5 mg total) by mouth 2 (two) times daily as needed for anxiety.   colchicine  0.6 MG tablet Take 1.2 mg ( 2 tablets)  by mouth  as a single dose at the first sign of gout flare, followed by 0.6 mg ( 1 tablet)  BY MOUTH 1 hour later, then 0.6 mg ( 1 tablet) daily for 6 days (Patient taking differently: Take 1.2 mg ( 2 tablets)  by mouth  as a single dose at the first sign of gout flare, followed by 0.6 mg ( 1 tablet)  BY MOUTH 1 hour later, then 0.6 mg ( 1 tablet) daily for 6 days as needed)   diclofenac  Sodium (VOLTAREN ) 1 % GEL Apply 2 g topically 4 (four) times daily. Apply to right knee. (Patient not taking: Reported on 03/05/2024)   empagliflozin  (JARDIANCE ) 10 MG TABS tablet Take 1 tablet (10 mg total) by mouth daily.   furosemide  (LASIX ) 40 MG tablet Take 1 tablet (40 mg total) by mouth every Monday, Wednesday, and Friday. Take extra dose (including Tuesday, Thursday, Saturday or Sunday) in case of weight  gain 2 to 3 lbs in 24 hrs or 5 lbs in 7 days, until weight back to baseline. (Patient not taking: Reported on 03/05/2024)   hydrALAZINE  (APRESOLINE ) 100 MG tablet Take 1 tablet (100 mg total) by mouth 3 (three) times daily.   isosorbide  dinitrate (ISORDIL ) 20 MG tablet Take 1 tablet (20 mg total) by mouth 3 (three) times daily.   metFORMIN  (GLUCOPHAGE ) 500 MG tablet Take 1 tablet (500 mg total) by mouth daily with breakfast. (Patient not taking: Reported on 03/05/2024)   potassium chloride  SA (KLOR-CON  M) 20 MEQ tablet Take 1 tablet (20 mEq total) by mouth every Monday, Wednesday, and Friday. Take one tablet when taking extra tablet of furosemide .   Respiratory Therapy Supplies (CARETOUCH CPAP MASK WIPES) MISC For cpap machine   sacubitril -valsartan  (ENTRESTO ) 49-51 MG Take 1 tablet by mouth 2 (two) times daily.   sertraline  (ZOLOFT ) 100 MG tablet Take 1 tablet (100 mg total) by mouth daily.   tirzepatide  (MOUNJARO ) 2.5 MG/0.5ML Pen Inject 2.5 mg into the skin once a week.   No facility-administered encounter medications on file as of 04/05/2024.    Past Medical History:  Diagnosis Date   Arthritis    Asthma    Back pain    CHF (congestive heart  failure) (HCC) 2008   EF 40-50%         Dyslipidemia    Fatty liver    Hypertension    Leg edema    Obesity, morbid (HCC)    Obstructive sleep apnea    Prediabetes     Past Surgical History:  Procedure Laterality Date   CARDIAC CATHETERIZATION  Oct 2013   cardiac cath negative for obstructive disease -- did show end diastolic pressure secondary to systemic hypertension   LAPAROSCOPIC GASTRIC SLEEVE RESECTION N/A 01/27/2015   Procedure: LAPAROSCOPIC GASTRIC SLEEVE RESECTION WITH UPPER ENDOSCOPY;  Surgeon: Donnice Lunger, MD;  Location: [facility];  Service: General;  Laterality: N/A;   LEFT HEART CATHETERIZATION WITH CORONARY ANGIOGRAM N/A 08/03/2012   Procedure: LEFT HEART CATHETERIZATION WITH CORONARY ANGIOGRAM;  Surgeon: Ozell Fell, MD;   Location: [facility] CATH LAB;  Service: Cardiovascular;  Laterality: N/A;   NO PAST SURGERIES      Family History  Problem Relation Age of Onset   Hypertension Mother    Obesity Mother    Hypertension Father     Social History   Socioeconomic History   Marital status: Married    Spouse name: Rickeda   Number of children: 2   Years of education: Not on file   Highest education level: Not on file  Occupational History   Occupation: Art gallery manager   Occupation: Training and development officer  Tobacco Use   Smoking status: Former    Current packs/day: 0.00    Types: Cigarettes    Quit date: 10/04/2006    Years since quitting: 17.5   Smokeless tobacco: Never  Vaping Use   Vaping status: Never Used  Substance and Sexual Activity   Alcohol use: No   Drug use: No   Sexual activity: Not on file  Other Topics Concern   Not on file  Social History Narrative   Employed:  Services ATM machines.    Social Drivers of Health   Financial Resource Strain: Medium Risk (08/01/2023)   Overall Financial Resource Strain (CARDIA)    Difficulty of Paying Living Expenses: Somewhat hard  Food Insecurity: No Food Insecurity (07/31/2023)   Hunger Vital Sign    Worried About Running Out of Food in the Last Year: Never true    Ran Out of Food in the Last Year: Never true  Transportation Needs: No Transportation Needs (07/31/2023)   PRAPARE - Administrator, Civil Service (Medical): No    Lack of Transportation (Non-Medical): No  Physical Activity: Inactive (08/10/2023)   Exercise Vital Sign    Days of Exercise per Week: 0 days    Minutes of Exercise per Session: 0 min  Stress: No Stress Concern Present (08/10/2023)   [employer] of Occupational Health - Occupational Stress Questionnaire    Feeling of Stress : Only a little  Social Connections: Unknown (08/10/2023)   Social Connection and Isolation Panel    Frequency of Communication with Friends and Family: Twice a week    Frequency of Social  Gatherings with Friends and Family: Not on file    Attends Religious Services: 1 to 4 times per year    Active Member of [employer] or Organizations: No    Attends Banker Meetings: Never    Marital Status: Married  Catering manager Violence: Not At Risk (07/31/2023)   Humiliation, Afraid, Rape, and Kick questionnaire    Fear of Current or Ex-Partner: No    Emotionally Abused: No    Physically Abused: No  Sexually Abused: No    Review of Systems  All other systems reviewed and are negative.       Objective    BP (!) 151/99   Pulse 89   Resp 16   Ht 6' (1.829 m)   Wt (!) 417 lb (189.1 kg)   SpO2 93%   BMI 56.56 kg/m   Physical Exam Vitals and nursing note reviewed.  Constitutional:      General: He is not in acute distress.    Appearance: He is obese.  Cardiovascular:     Rate and Rhythm: Normal rate and regular rhythm.  Pulmonary:     Effort: Pulmonary effort is normal. No respiratory distress.     Breath sounds: Normal breath sounds. No wheezing.  Abdominal:     Palpations: Abdomen is soft.     Tenderness: There is no abdominal tenderness.  Neurological:     General: No focal deficit present.     Mental Status: He is alert and oriented to person, place, and time.  Psychiatric:        Mood and Affect: Mood and affect normal.        Speech: Speech normal.        Behavior: Behavior normal. Behavior is cooperative.         Assessment & Plan:  1. Uncontrolled hypertension (Primary) Discussed compliance. Continue   2. Class 3 severe obesity due to excess calories with serious comorbidity and body mass index (BMI) of 50.0 to 59.9 in adult Continue on monjauro  3. Anxiety and depression Appears stable. Continue   4. Obstructive sleep apnea Encouraged compliance  5. Immunization due  - Pneumococcal conjugate vaccine 20-valent   No follow-ups on file.   Tanda Raguel SQUIBB, MD

## 2024-04-09 ENCOUNTER — Encounter: Payer: Self-pay | Admitting: Family Medicine

## 2024-04-11 ENCOUNTER — Other Ambulatory Visit: Payer: Self-pay | Admitting: Family Medicine

## 2024-04-11 MED ORDER — TIRZEPATIDE 5 MG/0.5ML ~~LOC~~ SOAJ: 5.0000 mg | SUBCUTANEOUS | 0 refills | Status: DC

## 2024-04-20 ENCOUNTER — Encounter: Payer: Self-pay | Admitting: Advanced Practice Midwife

## 2024-05-03 ENCOUNTER — Ambulatory Visit (INDEPENDENT_AMBULATORY_CARE_PROVIDER_SITE_OTHER): Admitting: Family Medicine

## 2024-05-03 VITALS — BP 142/101 | HR 75 | Ht 72.0 in | Wt >= 6400 oz

## 2024-05-03 DIAGNOSIS — Z0289 Encounter for other administrative examinations: Secondary | ICD-10-CM

## 2024-05-03 DIAGNOSIS — Z111 Encounter for screening for respiratory tuberculosis: Secondary | ICD-10-CM

## 2024-05-03 DIAGNOSIS — I10 Essential (primary) hypertension: Secondary | ICD-10-CM

## 2024-05-03 DIAGNOSIS — R519 Headache, unspecified: Secondary | ICD-10-CM

## 2024-05-03 NOTE — Progress Notes (Signed)
 Established Patient Office Visit  Subjective    Patient ID: Leonard Lewis, male    DOB: 01/25/1981  Age: 43 y.o. MRN: [id number]  CC:  Chief Complaint  Patient presents with   Medical Management of Chronic Issues    Forms for work  Pt reports he has been waking up with bad headaches once or twice a week    HPI Leonard Lewis presents for follow up of chronic med issues including hypertension. Patient also request completion of form for employment. Patient hedges on his med compliance but denies acute complaints.   Outpatient Encounter Medications as of 05/03/2024  Medication Sig   albuterol  (VENTOLIN  HFA) 108 (90 Base) MCG/ACT inhaler Inhale 2 puffs into the lungs every 6 (six) hours as needed for wheezing or shortness of breath.   allopurinol  (ZYLOPRIM ) 100 MG tablet TAKE 1 TABLET BY MOUTH EVERY DAY   aspirin  EC 81 MG tablet Take 1 tablet (81 mg total) by mouth daily. Swallow whole.   atorvastatin  (LIPITOR) 40 MG tablet Take 1 tablet (40 mg total) by mouth daily.   carvedilol  (COREG ) 25 MG tablet Take 1 tablet (25 mg total) by mouth 2 (two) times daily.   clonazePAM  (KLONOPIN ) 0.5 MG tablet Take 1 tablet (0.5 mg total) by mouth 2 (two) times daily as needed for anxiety.   empagliflozin  (JARDIANCE ) 10 MG TABS tablet Take 1 tablet (10 mg total) by mouth daily.   hydrALAZINE  (APRESOLINE ) 100 MG tablet Take 1 tablet (100 mg total) by mouth 3 (three) times daily.   isosorbide  dinitrate (ISORDIL ) 20 MG tablet Take 1 tablet (20 mg total) by mouth 3 (three) times daily.   potassium chloride  SA (KLOR-CON  M) 20 MEQ tablet Take 1 tablet (20 mEq total) by mouth every Monday, Wednesday, and Friday. Take one tablet when taking extra tablet of furosemide .   Respiratory Therapy Supplies (CARETOUCH CPAP MASK WIPES) MISC For cpap machine   sacubitril -valsartan  (ENTRESTO ) 49-51 MG Take 1 tablet by mouth 2 (two) times daily.   sertraline  (ZOLOFT ) 100 MG tablet Take 1 tablet (100 mg total) by mouth  daily.   tirzepatide  (MOUNJARO ) 2.5 MG/0.5ML Pen Inject 2.5 mg into the skin once a week.   tirzepatide  (MOUNJARO ) 5 MG/0.5ML Pen Inject 5 mg into the skin once a week.   colchicine  0.6 MG tablet Take 1.2 mg ( 2 tablets)  by mouth  as a single dose at the first sign of gout flare, followed by 0.6 mg ( 1 tablet)  BY MOUTH 1 hour later, then 0.6 mg ( 1 tablet) daily for 6 days (Patient taking differently: Take 1.2 mg ( 2 tablets)  by mouth  as a single dose at the first sign of gout flare, followed by 0.6 mg ( 1 tablet)  BY MOUTH 1 hour later, then 0.6 mg ( 1 tablet) daily for 6 days as needed)   diclofenac  Sodium (VOLTAREN ) 1 % GEL Apply 2 g topically 4 (four) times daily. Apply to right knee. (Patient not taking: Reported on 03/05/2024)   furosemide  (LASIX ) 40 MG tablet Take 1 tablet (40 mg total) by mouth every Monday, Wednesday, and Friday. Take extra dose (including Tuesday, Thursday, Saturday or Sunday) in case of weight gain 2 to 3 lbs in 24 hrs or 5 lbs in 7 days, until weight back to baseline. (Patient not taking: Reported on 03/05/2024)   metFORMIN  (GLUCOPHAGE ) 500 MG tablet Take 1 tablet (500 mg total) by mouth daily with breakfast. (Patient not taking: Reported on  03/05/2024)   No facility-administered encounter medications on file as of 05/03/2024.    Past Medical History:  Diagnosis Date   Arthritis    Asthma    Back pain    CHF (congestive heart failure) (HCC) 2008   EF 40-50%         Dyslipidemia    Fatty liver    Hypertension    Leg edema    Obesity, morbid (HCC)    Obstructive sleep apnea    Prediabetes     Past Surgical History:  Procedure Laterality Date   CARDIAC CATHETERIZATION  Oct 2013   cardiac cath negative for obstructive disease -- did show end diastolic pressure secondary to systemic hypertension   LAPAROSCOPIC GASTRIC SLEEVE RESECTION N/A 01/27/2015   Procedure: LAPAROSCOPIC GASTRIC SLEEVE RESECTION WITH UPPER ENDOSCOPY;  Surgeon: Donnice Lunger, MD;  Location: [facility]  ORS;  Service: General;  Laterality: N/A;   LEFT HEART CATHETERIZATION WITH CORONARY ANGIOGRAM N/A 08/03/2012   Procedure: LEFT HEART CATHETERIZATION WITH CORONARY ANGIOGRAM;  Surgeon: Ozell Fell, MD;  Location: [facility] CATH LAB;  Service: Cardiovascular;  Laterality: N/A;   NO PAST SURGERIES      Family History  Problem Relation Age of Onset   Hypertension Mother    Obesity Mother    Hypertension Father     Social History   Socioeconomic History   Marital status: Married    Spouse name: Rickeda   Number of children: 2   Years of education: Not on file   Highest education level: Not on file  Occupational History   Occupation: Art gallery manager   Occupation: Training and development officer  Tobacco Use   Smoking status: Former    Current packs/day: 0.00    Types: Cigarettes    Quit date: 10/04/2006    Years since quitting: 17.6   Smokeless tobacco: Never  Vaping Use   Vaping status: Never Used  Substance and Sexual Activity   Alcohol use: No   Drug use: No   Sexual activity: Not on file  Other Topics Concern   Not on file  Social History Narrative   Employed:  Services ATM machines.    Social Drivers of Health   Financial Resource Strain: Medium Risk (08/01/2023)   Overall Financial Resource Strain (CARDIA)    Difficulty of Paying Living Expenses: Somewhat hard  Food Insecurity: No Food Insecurity (07/31/2023)   Hunger Vital Sign    Worried About Running Out of Food in the Last Year: Never true    Ran Out of Food in the Last Year: Never true  Transportation Needs: No Transportation Needs (07/31/2023)   PRAPARE - Administrator, Civil Service (Medical): No    Lack of Transportation (Non-Medical): No  Physical Activity: Inactive (08/10/2023)   Exercise Vital Sign    Days of Exercise per Week: 0 days    Minutes of Exercise per Session: 0 min  Stress: No Stress Concern Present (08/10/2023)   [employer] of Occupational Health - Occupational Stress Questionnaire     Feeling of Stress : Only a little  Social Connections: Unknown (08/10/2023)   Social Connection and Isolation Panel    Frequency of Communication with Friends and Family: Twice a week    Frequency of Social Gatherings with Friends and Family: Not on file    Attends Religious Services: 1 to 4 times per year    Active Member of [employer] or Organizations: No    Attends Banker Meetings: Never    Marital Status:  Married  Intimate Partner Violence: Not At Risk (07/31/2023)   Humiliation, Afraid, Rape, and Kick questionnaire    Fear of Current or Ex-Partner: No    Emotionally Abused: No    Physically Abused: No    Sexually Abused: No    Review of Systems  All other systems reviewed and are negative.       Objective    BP (!) 142/101   Pulse 75   Ht 6' (1.829 m)   Wt (!) 425 lb 12.8 oz (193.1 kg)   SpO2 94%   BMI 57.75 kg/m   Physical Exam Vitals and nursing note reviewed.  Constitutional:      General: He is not in acute distress.    Appearance: He is obese.  HENT:     Head: Normocephalic and atraumatic.  Cardiovascular:     Rate and Rhythm: Normal rate and regular rhythm.  Pulmonary:     Effort: Pulmonary effort is normal.     Breath sounds: Normal breath sounds.  Abdominal:     Palpations: Abdomen is soft.     Tenderness: There is no abdominal tenderness.  Neurological:     General: No focal deficit present.     Mental Status: He is alert and oriented to person, place, and time.  Psychiatric:        Mood and Affect: Mood and affect normal.        Speech: Speech normal.        Behavior: Behavior normal. Behavior is cooperative.         Assessment & Plan:   Uncontrolled hypertension  Nonintractable headache, unspecified chronicity pattern, unspecified headache type  Screening-pulmonary TB -     QuantiFERON-TB Gold Plus  Encounter for completion of form with patient     No follow-ups on file.   Tanda Raguel SQUIBB, MD

## 2024-05-07 ENCOUNTER — Encounter: Payer: Self-pay | Admitting: Family Medicine

## 2024-05-07 ENCOUNTER — Other Ambulatory Visit: Payer: Self-pay | Admitting: Family Medicine

## 2024-05-08 ENCOUNTER — Ambulatory Visit: Payer: Self-pay | Admitting: Family Medicine

## 2024-05-08 LAB — QUANTIFERON-TB GOLD PLUS
QuantiFERON Mitogen Value: >10 [IU]/mL
QuantiFERON Nil Value: 0.03 [IU]/mL
QuantiFERON TB1 Ag Value: 0.04 [IU]/mL
QuantiFERON TB2 Ag Value: 0.04 [IU]/mL
QuantiFERON-TB Gold Plus: NEGATIVE

## 2024-05-08 NOTE — Telephone Encounter (Signed)
 Call to patient. No answer. LVM letting him know his results were negative and that I have completed his form it is ready for him for pick up at the front desk

## 2024-05-08 NOTE — Telephone Encounter (Signed)
-----   Message from Tanda Raguel SQUIBB sent at 05/08/2024 10:01 AM EDT ----- NEG TB Quantiferon ----- Message ----- From: Rebecka Memos Lab Results In Sent: 05/08/2024  12:35 AM EDT To: Raguel Tanda, MD

## 2024-05-10 NOTE — Telephone Encounter (Signed)
 Relayed TB results to patient. Patient inquiring if forms could be uploaded to mychart and sent to him.

## 2024-05-11 ENCOUNTER — Encounter: Payer: Self-pay | Admitting: Family Medicine

## 2024-06-08 ENCOUNTER — Other Ambulatory Visit: Payer: Self-pay | Admitting: Family Medicine

## 2024-07-09 ENCOUNTER — Other Ambulatory Visit: Payer: Self-pay | Admitting: Family Medicine

## 2024-07-09 ENCOUNTER — Ambulatory Visit: Admitting: Family Medicine

## 2024-07-25 ENCOUNTER — Other Ambulatory Visit: Payer: Self-pay | Admitting: Medical Genetics

## 2024-07-25 DIAGNOSIS — Z006 Encounter for examination for normal comparison and control in clinical research program: Secondary | ICD-10-CM

## 2024-08-01 ENCOUNTER — Telehealth: Payer: Self-pay | Admitting: Family Medicine

## 2024-08-01 NOTE — Telephone Encounter (Signed)
 A document form from [employer] has been faxed: Order for obstructive sleep apnea supplies, to be filled out by provider. Send document back via Fax within 7-days to 14 days. Document is located in providers tray at front office.          Fax number: [phone]

## 2024-08-03 NOTE — Telephone Encounter (Signed)
 Received. Given to Dr. Tanda for review

## 2024-08-07 NOTE — Telephone Encounter (Signed)
 Completed and faxed to [employer]

## 2024-10-05 ENCOUNTER — Ambulatory Visit
Admission: EM | Admit: 2024-10-05 | Discharge: 2024-10-05 | Disposition: A | Discharge status: Home / Self Care | Attending: Family Medicine | Admitting: Family Medicine

## 2024-10-05 ENCOUNTER — Encounter: Payer: Self-pay | Admitting: *Deleted

## 2024-10-05 ENCOUNTER — Ambulatory Visit (INDEPENDENT_AMBULATORY_CARE_PROVIDER_SITE_OTHER)

## 2024-10-05 DIAGNOSIS — R0602 Shortness of breath: Secondary | ICD-10-CM

## 2024-10-05 DIAGNOSIS — I5042 Chronic combined systolic (congestive) and diastolic (congestive) heart failure: Secondary | ICD-10-CM | POA: Diagnosis not present

## 2024-10-05 DIAGNOSIS — R059 Cough, unspecified: Secondary | ICD-10-CM | POA: Diagnosis not present

## 2024-10-05 DIAGNOSIS — R5383 Other fatigue: Secondary | ICD-10-CM | POA: Diagnosis not present

## 2024-10-05 MED ORDER — FUROSEMIDE 40 MG PO TABS: 40.0000 mg | ORAL_TABLET | Freq: Every day | ORAL | 0 refills | Status: DC | PRN

## 2024-10-05 MED ORDER — POTASSIUM CHLORIDE CRYS ER 20 MEQ PO TBCR: 40.0000 meq | EXTENDED_RELEASE_TABLET | Freq: Every day | ORAL | 0 refills | Status: DC

## 2024-10-05 NOTE — ED Triage Notes (Addendum)
 Pt c/o productive cough for a few weeks. Since yesterday he has had fatigue and been feeling weak. States he tripped and fell on Wednesday and then again yesterday and would like his equilibrium checked. His PCP is Dr. Tanda at [facility]. Endorses Shob with exertion.

## 2024-10-05 NOTE — Discharge Instructions (Addendum)
 Your chest x-ray shows possibly a little more fluid than you had when the last chest x-ray was done a little over a year ago. The radiologist will also read your x-ray, and if their interpretation differs significantly from mine, and the management of your condition would change, we will call you.  Take furosemide  40 mg--1 daily as needed for fluid and shortness of breath.  Potassium chloride  20 mill equivalents--take 2 daily on the days you take the furosemide .   We have drawn blood to check your blood counts and sodium and potassium.  Staff will notify you if there is anything significantly abnormal  Please make an appointment to follow-up with your primary care and with your heart doctor.  If you worsen in any way, please go to the emergency room

## 2024-10-05 NOTE — ED Provider Notes (Signed)
 " [facility]    CSN: [id number] Arrival date & time: 10/05/24  1235      History   Chief Complaint Chief Complaint  Patient presents with   Cough    HPI Leonard Lewis is a 44 y.o. male.    Cough  Here for cough and shortness of breath with exertion.  He began coughing a few weeks ago.  It is worse at night.  Then he has also started noticing shortness of breath with exertion.  No fever or chills.  In the last 2 days, he has felt more fatigued and weak.  Send he tripped and fell yesterday and was concerned that his equilibrium needed checking.  Note headache.  The shortness of breath that he has at night does improve with sitting up.  Past medical history is reviewed.  He has a history of congestive heart failure and hypertension.  He does not have diabetes but has prediabetes.  He also has CKD  NKDA  Past Medical History:  Diagnosis Date   Arthritis    Asthma    Back pain    CHF (congestive heart failure) (HCC) 2008   EF 40-50%         Dyslipidemia    Fatty liver    Hypertension    Leg edema    Obesity, morbid (HCC)    Obstructive sleep apnea    Prediabetes     Patient Active Problem List   Diagnosis Date Noted   Acute on chronic systolic CHF (congestive heart failure) (HCC) 07/31/2023   Chronic kidney disease, stage 3a (HCC) 07/31/2023   Asthma 07/31/2023   Iron  deficiency anemia 07/31/2023   Pain in joint of left elbow 03/03/2023   CAP (community acquired pneumonia) 05/14/2022   Community acquired pneumonia 05/14/2022   Acute left-sided low back pain with left-sided sciatica 11/06/2020   Gastroesophageal reflux disease without esophagitis 11/06/2020   Diarrhea 11/06/2020   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   Left foot pain 05/23/2020   Tonsillar hypertrophy 01/24/2020   Frequent nosebleeds 01/24/2020   Vitamin D  deficiency 06/19/2019   Prediabetes 01/03/2018   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    Chronic systolic heart failure (HCC) 10/30/2013   Chronic combined systolic (congestive) and diastolic (congestive) heart failure (HCC) 10/30/2013   Hypertensive urgency 10/26/2011   Obstructive sleep apnea 05/31/2007   HYPERTENSION, BENIGN ESSENTIAL 05/02/2007   HLD (hyperlipidemia) 03/17/2007   Morbid obesity (HCC) 03/17/2007    Past Surgical History:  Procedure Laterality Date   CARDIAC CATHETERIZATION  Oct 2013   cardiac cath negative for obstructive disease -- did show end diastolic pressure secondary to systemic hypertension   LAPAROSCOPIC GASTRIC SLEEVE RESECTION N/A 01/27/2015   Procedure: LAPAROSCOPIC GASTRIC SLEEVE RESECTION WITH UPPER ENDOSCOPY;  Surgeon: Donnice Lunger, MD;  Location: [facility];  Service: General;  Laterality: N/A;   LEFT HEART CATHETERIZATION WITH CORONARY ANGIOGRAM N/A 08/03/2012   Procedure: LEFT HEART CATHETERIZATION WITH CORONARY ANGIOGRAM;  Surgeon: Ozell Fell, MD;  Location: [facility] LAB;  Service: Cardiovascular;  Laterality: N/A;   NO PAST SURGERIES         Home Medications    Prior to Admission medications  Medication Sig Start Date End Date Taking? Authorizing Provider  albuterol  (VENTOLIN  HFA) 108 (90 Base) MCG/ACT inhaler Inhale 2 puffs into the lungs every 6 (six) hours as needed for wheezing or shortness of breath. 03/03/23  Yes Tanda Bleacher, MD  allopurinol  (ZYLOPRIM ) 100 MG tablet TAKE  1 TABLET BY MOUTH EVERY DAY 01/26/24  Yes Tanda Bleacher, MD  aspirin  EC 81 MG tablet Take 1 tablet (81 mg total) by mouth daily. Swallow whole. 08/17/23  Yes Milford, Harlene HERO, FNP  atorvastatin  (LIPITOR) 40 MG tablet Take 1 tablet (40 mg total) by mouth daily. 08/17/23  Yes Milford, Harlene HERO, FNP  carvedilol  (COREG ) 25 MG tablet Take 1 tablet (25 mg total) by mouth 2 (two) times daily. 08/17/23  Yes Milford, Harlene HERO, FNP  clonazePAM  (KLONOPIN ) 0.5 MG tablet Take 1 tablet (0.5 mg total) by mouth 2 (two) times daily as needed for anxiety. 10/13/23  Yes  Tanda Bleacher, MD  colchicine  0.6 MG tablet Take 1.2 mg ( 2 tablets)  by mouth  as a single dose at the first sign of gout flare, followed by 0.6 mg ( 1 tablet)  BY MOUTH 1 hour later, then 0.6 mg ( 1 tablet) daily for 6 days Patient taking differently: Take 1.2 mg ( 2 tablets)  by mouth  as a single dose at the first sign of gout flare, followed by 0.6 mg ( 1 tablet)  BY MOUTH 1 hour later, then 0.6 mg ( 1 tablet) daily for 6 days as needed 02/02/23  Yes White, Adrienne R, NP  furosemide  (LASIX ) 40 MG tablet Take 1 tablet (40 mg total) by mouth daily as needed for edema. 10/05/24  Yes Vonna Sharlet POUR, MD  hydrALAZINE  (APRESOLINE ) 100 MG tablet Take 1 tablet (100 mg total) by mouth 3 (three) times daily. 09/19/23 10/05/24 Yes Sabharwal, Aditya, DO  isosorbide  dinitrate (ISORDIL ) 20 MG tablet Take 1 tablet (20 mg total) by mouth 3 (three) times daily. 09/19/23  Yes Sabharwal, Aditya, DO  potassium chloride  SA (KLOR-CON  M) 20 MEQ tablet Take 1 tablet (20 mEq total) by mouth every Monday, Wednesday, and Friday. Take one tablet when taking extra tablet of furosemide . 09/19/23  Yes Sabharwal, Aditya, DO  potassium chloride  SA (KLOR-CON  M) 20 MEQ tablet Take 2 tablets (40 mEq total) by mouth daily. On the days you take furosemide . 10/05/24  Yes Jaylani Mcguinn, Sharlet POUR, MD  sacubitril -valsartan  (ENTRESTO ) 49-51 MG Take 1 tablet by mouth 2 (two) times daily. 03/05/24  Yes Sabharwal, Aditya, DO  sertraline  (ZOLOFT ) 100 MG tablet TAKE 1 TABLET BY MOUTH EVERY DAY 07/10/24  Yes Tanda Bleacher, MD  diclofenac  Sodium (VOLTAREN ) 1 % GEL Apply 2 g topically 4 (four) times daily. Apply to right knee. Patient not taking: Reported on 03/05/2024 08/04/23   Arrien, Elidia Sieving, MD  empagliflozin  (JARDIANCE ) 10 MG TABS tablet Take 1 tablet (10 mg total) by mouth daily. 09/19/23   Gardenia Led, DO  furosemide  (LASIX ) 40 MG tablet Take 1 tablet (40 mg total) by mouth every Monday, Wednesday, and Friday. Take extra dose (including  Tuesday, Thursday, Saturday or Sunday) in case of weight gain 2 to 3 lbs in 24 hrs or 5 lbs in 7 days, until weight back to baseline. Patient not taking: Reported on 03/05/2024 08/17/23   Glena Harlene HERO, FNP  metFORMIN  (GLUCOPHAGE ) 500 MG tablet Take 1 tablet (500 mg total) by mouth daily with breakfast. Patient not taking: Reported on 03/05/2024 08/04/23   Arrien, Elidia Sieving, MD  Respiratory Therapy Supplies [facility] CPAP MASK WIPES) MISC For cpap machine 07/29/21   Tanda Bleacher, MD  tirzepatide  (MOUNJARO ) 2.5 MG/0.5ML Pen Inject 2.5 mg into the skin once a week. 02/07/24   Tanda Bleacher, MD  tirzepatide  (MOUNJARO ) 5 MG/0.5ML Pen INJECT 5 MG SUBCUTANEOUSLY WEEKLY 06/13/24  Tanda Bleacher, MD    Family History Family History  Problem Relation Age of Onset   Hypertension Mother    Obesity Mother    Hypertension Father     Social History Social History[1]   Allergies   Patient has no known allergies.   Review of Systems Review of Systems  Respiratory:  Positive for cough.      Physical Exam Triage Vital Signs ED Triage Vitals  Encounter Vitals Group     BP 10/05/24 1349 (!) 143/86     Girls Systolic BP Percentile --      Girls Diastolic BP Percentile --      Boys Systolic BP Percentile --      Boys Diastolic BP Percentile --      Pulse Rate 10/05/24 1349 72     Resp 10/05/24 1349 (!) 24     Temp 10/05/24 1349 97.8 F (36.6 C)     Temp Source 10/05/24 1349 Oral     SpO2 10/05/24 1349 94 %     Weight --      Height --      Head Circumference --      Peak Flow --      Pain Score 10/05/24 1345 5     Pain Loc --      Pain Education --      Exclude from Growth Chart --    No data found.  Updated Vital Signs BP (!) 143/86 (BP Location: Left Wrist)   Pulse 72   Temp 97.8 F (36.6 C) (Oral)   Resp (!) 24   SpO2 94%   Visual Acuity Right Eye Distance:   Left Eye Distance:   Bilateral Distance:    Right Eye Near:   Left Eye Near:    Bilateral Near:      Physical Exam Vitals reviewed.  Constitutional:      General: He is not in acute distress.    Appearance: He is not ill-appearing, toxic-appearing or diaphoretic.  HENT:     Nose: Nose normal.     Mouth/Throat:     Mouth: Mucous membranes are moist.  Eyes:     Extraocular Movements: Extraocular movements intact.     Conjunctiva/sclera: Conjunctivae normal.     Pupils: Pupils are equal, round, and reactive to light.  Cardiovascular:     Rate and Rhythm: Normal rate and regular rhythm.  Pulmonary:     Effort: No respiratory distress.     Breath sounds: No stridor. No wheezing, rhonchi or rales.     Comments: Breath sounds are distant.  No crackles, rhonchi, or wheezes heard Musculoskeletal:     Cervical back: Neck supple.  Lymphadenopathy:     Cervical: No cervical adenopathy.  Skin:    Coloration: Skin is not jaundiced or pale.  Neurological:     General: No focal deficit present.     Mental Status: He is alert and oriented to person, place, and time.  Psychiatric:        Behavior: Behavior normal.      UC Treatments / Results  Labs (all labs ordered are listed, but only abnormal results are displayed) Labs Reviewed  BASIC METABOLIC PANEL WITH GFR  CBC    EKG   Radiology No results found.  Procedures Procedures (including critical care time)  Medications Ordered in UC Medications - No data to display  Initial Impression / Assessment and Plan / UC Course  I have reviewed the triage vital signs and the  nursing notes.  Pertinent labs & imaging results that were available during my care of the patient were reviewed by me and considered in my medical decision making (see chart for details).     There is possibly slight increase in the interstitial fluid compared to his last chest x-ray.  3-day supply of Lasix  plus potassium is sent in for him.  He has not been taking the potassium and Lasix  at all.  I have asked him to follow-up with his primary care and  with his cardiologist.  CBC and BMP are drawn today and staff will notify him if anything is significantly abnormal. Final Clinical Impressions(s) / UC Diagnoses   Final diagnoses:  Shortness of breath  Fatigue, unspecified type  Cough, unspecified type  Chronic combined systolic and diastolic congestive heart failure [facility])     Discharge Instructions      Your chest x-ray shows possibly a little more fluid than you had when the last chest x-ray was done a little over a year ago. The radiologist will also read your x-ray, and if their interpretation differs significantly from mine, and the management of your condition would change, we will call you.  Take furosemide  40 mg--1 daily as needed for fluid and shortness of breath.  Potassium chloride  20 mill equivalents--take 2 daily on the days you take the furosemide .   We have drawn blood to check your blood counts and sodium and potassium.  Staff will notify you if there is anything significantly abnormal  Please make an appointment to follow-up with your primary care and with your heart doctor.  If you worsen in any way, please go to the emergency room       ED Prescriptions     Medication Sig Dispense Auth. Provider   furosemide  (LASIX ) 40 MG tablet Take 1 tablet (40 mg total) by mouth daily as needed for edema. 3 tablet Waris Rodger K, MD   potassium chloride  SA (KLOR-CON  M) 20 MEQ tablet Take 2 tablets (40 mEq total) by mouth daily. On the days you take furosemide . 6 tablet Mosetta Ferdinand K, MD      PDMP not reviewed this encounter.    [1]  Social History Tobacco Use   Smoking status: Former    Current packs/day: 0.00    Types: Cigarettes    Quit date: 10/04/2006    Years since quitting: 18.0   Smokeless tobacco: Never  Vaping Use   Vaping status: Never Used  Substance Use Topics   Alcohol use: No   Drug use: No     Vonna Sharlet POUR, MD 10/05/24 1522  "

## 2024-10-06 LAB — CBC
Hematocrit: 40.5 % (ref 37.5–51.0)
Hemoglobin: 12.5 g/dL — ABNORMAL LOW (ref 13.0–17.7)
MCH: 22.3 pg — ABNORMAL LOW (ref 26.6–33.0)
MCHC: 30.9 g/dL — ABNORMAL LOW (ref 31.5–35.7)
MCV: 72 fL — ABNORMAL LOW (ref 79–97)
Platelets: 283 x10E3/uL (ref 150–450)
RBC: 5.61 x10E6/uL (ref 4.14–5.80)
RDW: 15.7 % — ABNORMAL HIGH (ref 11.6–15.4)
WBC: 7.1 x10E3/uL (ref 3.4–10.8)

## 2024-10-06 LAB — BASIC METABOLIC PANEL WITH GFR
BUN/Creatinine Ratio: 14 (ref 9–20)
BUN: 27 mg/dL — ABNORMAL HIGH (ref 6–24)
CO2: 23 mmol/L (ref 20–29)
Calcium: 9 mg/dL (ref 8.7–10.2)
Chloride: 105 mmol/L (ref 96–106)
Creatinine, Ser: 1.98 mg/dL — ABNORMAL HIGH (ref 0.76–1.27)
Glucose: 91 mg/dL (ref 70–99)
Potassium: 4.5 mmol/L (ref 3.5–5.2)
Sodium: 141 mmol/L (ref 134–144)
eGFR: 42 mL/min/1.73 — ABNORMAL LOW

## 2024-10-08 ENCOUNTER — Ambulatory Visit (HOSPITAL_COMMUNITY): Payer: Self-pay | Admitting: Family Medicine

## 2024-10-15 ENCOUNTER — Encounter: Payer: Self-pay | Admitting: Family Medicine

## 2024-10-15 ENCOUNTER — Ambulatory Visit (INDEPENDENT_AMBULATORY_CARE_PROVIDER_SITE_OTHER): Admitting: Family Medicine

## 2024-10-15 VITALS — BP 153/98 | HR 85 | Ht 72.0 in | Wt >= 6400 oz

## 2024-10-15 DIAGNOSIS — R351 Nocturia: Secondary | ICD-10-CM | POA: Diagnosis not present

## 2024-10-15 DIAGNOSIS — E66813 Obesity, class 3: Secondary | ICD-10-CM

## 2024-10-15 DIAGNOSIS — F32A Depression, unspecified: Secondary | ICD-10-CM

## 2024-10-15 DIAGNOSIS — I5042 Chronic combined systolic (congestive) and diastolic (congestive) heart failure: Secondary | ICD-10-CM

## 2024-10-15 DIAGNOSIS — I10 Essential (primary) hypertension: Secondary | ICD-10-CM | POA: Diagnosis not present

## 2024-10-15 DIAGNOSIS — Z6841 Body Mass Index (BMI) 40.0 and over, adult: Secondary | ICD-10-CM | POA: Diagnosis not present

## 2024-10-15 DIAGNOSIS — G4733 Obstructive sleep apnea (adult) (pediatric): Secondary | ICD-10-CM | POA: Diagnosis not present

## 2024-10-15 DIAGNOSIS — F419 Anxiety disorder, unspecified: Secondary | ICD-10-CM

## 2024-10-15 DIAGNOSIS — E782 Mixed hyperlipidemia: Secondary | ICD-10-CM

## 2024-10-15 MED ORDER — EMPAGLIFLOZIN 10 MG PO TABS: 10.0000 mg | ORAL_TABLET | Freq: Every day | ORAL | 6 refills | Status: AC

## 2024-10-15 MED ORDER — TAMSULOSIN HCL 0.4 MG PO CAPS: 0.4000 mg | ORAL_CAPSULE | Freq: Every day | ORAL | 1 refills | Status: AC

## 2024-10-15 MED ORDER — POTASSIUM CHLORIDE CRYS ER 20 MEQ PO TBCR: 40.0000 meq | EXTENDED_RELEASE_TABLET | Freq: Every day | ORAL | 0 refills | Status: AC

## 2024-10-15 MED ORDER — FUROSEMIDE 40 MG PO TABS: 40.0000 mg | ORAL_TABLET | Freq: Every day | ORAL | 0 refills | Status: DC | PRN

## 2024-10-15 MED ORDER — MOUNJARO 5 MG/0.5ML ~~LOC~~ SOAJ: 5.0000 mg | SUBCUTANEOUS | 0 refills | Status: AC

## 2024-10-15 NOTE — Progress Notes (Signed)
 "  Established Patient Office Visit  Subjective    Patient ID: Leonard Lewis, male    DOB: 09/11/1981  Age: 44 y.o. MRN: [id number]  CC:  Chief Complaint  Patient presents with   Medical Management of Chronic Issues    HPI Leonard Lewis presents for follow up of chronic med issues including  hypertension, OSA, and anxiety/depression. Patient reports that he has not been compliant with his cpap. He also complains of urination at night about 5-7 times.   Outpatient Encounter Medications as of 10/15/2024  Medication Sig   albuterol  (VENTOLIN  HFA) 108 (90 Base) MCG/ACT inhaler Inhale 2 puffs into the lungs every 6 (six) hours as needed for wheezing or shortness of breath.   allopurinol  (ZYLOPRIM ) 100 MG tablet TAKE 1 TABLET BY MOUTH EVERY DAY   aspirin  EC 81 MG tablet Take 1 tablet (81 mg total) by mouth daily. Swallow whole.   carvedilol  (COREG ) 25 MG tablet Take 1 tablet (25 mg total) by mouth 2 (two) times daily.   isosorbide  dinitrate (ISORDIL ) 20 MG tablet Take 1 tablet (20 mg total) by mouth 3 (three) times daily.   metFORMIN  (GLUCOPHAGE ) 500 MG tablet Take 1 tablet (500 mg total) by mouth daily with breakfast.   sacubitril -valsartan  (ENTRESTO ) 49-51 MG Take 1 tablet by mouth 2 (two) times daily.   sertraline  (ZOLOFT ) 100 MG tablet TAKE 1 TABLET BY MOUTH EVERY DAY   tamsulosin  (FLOMAX ) 0.4 MG CAPS capsule Take 1 capsule (0.4 mg total) by mouth daily.   [DISCONTINUED] furosemide  (LASIX ) 40 MG tablet Take 1 tablet (40 mg total) by mouth daily as needed for edema.   atorvastatin  (LIPITOR) 40 MG tablet Take 1 tablet (40 mg total) by mouth daily.   clonazePAM  (KLONOPIN ) 0.5 MG tablet Take 1 tablet (0.5 mg total) by mouth 2 (two) times daily as needed for anxiety. (Patient not taking: Reported on 10/15/2024)   colchicine  0.6 MG tablet Take 1.2 mg ( 2 tablets)  by mouth  as a single dose at the first sign of gout flare, followed by 0.6 mg ( 1 tablet)  BY MOUTH 1 hour later, then 0.6 mg ( 1  tablet) daily for 6 days (Patient taking differently: Take 1.2 mg ( 2 tablets)  by mouth  as a single dose at the first sign of gout flare, followed by 0.6 mg ( 1 tablet)  BY MOUTH 1 hour later, then 0.6 mg ( 1 tablet) daily for 6 days as needed)   diclofenac  Sodium (VOLTAREN ) 1 % GEL Apply 2 g topically 4 (four) times daily. Apply to right knee. (Patient not taking: Reported on 03/05/2024)   empagliflozin  (JARDIANCE ) 10 MG TABS tablet Take 1 tablet (10 mg total) by mouth daily.   furosemide  (LASIX ) 40 MG tablet Take 1 tablet (40 mg total) by mouth every Monday, Wednesday, and Friday. Take extra dose (including Tuesday, Thursday, Saturday or Sunday) in case of weight gain 2 to 3 lbs in 24 hrs or 5 lbs in 7 days, until weight back to baseline. (Patient not taking: Reported on 03/05/2024)   furosemide  (LASIX ) 40 MG tablet Take 1 tablet (40 mg total) by mouth daily as needed for edema.   hydrALAZINE  (APRESOLINE ) 100 MG tablet Take 1 tablet (100 mg total) by mouth 3 (three) times daily. (Patient not taking: Reported on 10/15/2024)   potassium chloride  SA (KLOR-CON  M) 20 MEQ tablet Take 1 tablet (20 mEq total) by mouth every Monday, Wednesday, and Friday. Take one tablet when taking  extra tablet of furosemide .   potassium chloride  SA (KLOR-CON  M) 20 MEQ tablet Take 2 tablets (40 mEq total) by mouth daily. On the days you take furosemide .   Respiratory Therapy Supplies (CARETOUCH CPAP MASK WIPES) MISC For cpap machine   tirzepatide  (MOUNJARO ) 2.5 MG/0.5ML Pen Inject 2.5 mg into the skin once a week.   tirzepatide  (MOUNJARO ) 5 MG/0.5ML Pen Inject 5 mg into the skin once a week.   [DISCONTINUED] empagliflozin  (JARDIANCE ) 10 MG TABS tablet Take 1 tablet (10 mg total) by mouth daily.   [DISCONTINUED] potassium chloride  SA (KLOR-CON  M) 20 MEQ tablet Take 2 tablets (40 mEq total) by mouth daily. On the days you take furosemide .   [DISCONTINUED] tirzepatide  (MOUNJARO ) 5 MG/0.5ML Pen INJECT 5 MG SUBCUTANEOUSLY WEEKLY    No facility-administered encounter medications on file as of 10/15/2024.    Past Medical History:  Diagnosis Date   Arthritis    Asthma    Back pain    CHF (congestive heart failure) (HCC) 2008   EF 40-50%         Dyslipidemia    Fatty liver    Hypertension    Leg edema    Obesity, morbid (HCC)    Obstructive sleep apnea    Prediabetes     Past Surgical History:  Procedure Laterality Date   CARDIAC CATHETERIZATION  Oct 2013   cardiac cath negative for obstructive disease -- did show end diastolic pressure secondary to systemic hypertension   LAPAROSCOPIC GASTRIC SLEEVE RESECTION N/A 01/27/2015   Procedure: LAPAROSCOPIC GASTRIC SLEEVE RESECTION WITH UPPER ENDOSCOPY;  Surgeon: Donnice Lunger, MD;  Location: [facility];  Service: General;  Laterality: N/A;   LEFT HEART CATHETERIZATION WITH CORONARY ANGIOGRAM N/A 08/03/2012   Procedure: LEFT HEART CATHETERIZATION WITH CORONARY ANGIOGRAM;  Surgeon: Ozell Fell, MD;  Location: [facility] CATH LAB;  Service: Cardiovascular;  Laterality: N/A;   NO PAST SURGERIES      Family History  Problem Relation Age of Onset   Hypertension Mother    Obesity Mother    Hypertension Father     Social History   Socioeconomic History   Marital status: Married    Spouse name: Leonard Lewis   Number of children: 2   Years of education: Not on file   Highest education level: Not on file  Occupational History   Occupation: Art Gallery Manager   Occupation: Training And Development Officer  Tobacco Use   Smoking status: Former    Current packs/day: 0.00    Types: Cigarettes    Quit date: 10/04/2006    Years since quitting: 18.0   Smokeless tobacco: Never  Vaping Use   Vaping status: Never Used  Substance and Sexual Activity   Alcohol use: No   Drug use: No   Sexual activity: Not on file  Other Topics Concern   Not on file  Social History Narrative   Employed:  Services ATM machines.    Social Drivers of Health   Tobacco Use: Medium Risk (10/15/2024)   Patient History     Smoking Tobacco Use: Former    Smokeless Tobacco Use: Never    Passive Exposure: Not on file  Financial Resource Strain: Medium Risk (08/01/2023)   Overall Financial Resource Strain (CARDIA)    Difficulty of Paying Living Expenses: Somewhat hard  Food Insecurity: No Food Insecurity (07/31/2023)   Hunger Vital Sign    Worried About Running Out of Food in the Last Year: Never true    Ran Out of Food in the Last Year: Never  true  Transportation Needs: No Transportation Needs (07/31/2023)   PRAPARE - Administrator, Civil Service (Medical): No    Lack of Transportation (Non-Medical): No  Physical Activity: Inactive (08/10/2023)   Exercise Vital Sign    Days of Exercise per Week: 0 days    Minutes of Exercise per Session: 0 min  Stress: No Stress Concern Present (08/10/2023)   [employer] of Occupational Health - Occupational Stress Questionnaire    Feeling of Stress : Only a little  Social Connections: Unknown (08/10/2023)   Social Connection and Isolation Panel    Frequency of Communication with Friends and Family: Twice a week    Frequency of Social Gatherings with Friends and Family: Not on file    Attends Religious Services: 1 to 4 times per year    Active Member of Clubs or Organizations: No    Attends Banker Meetings: Never    Marital Status: Married  Catering Manager Violence: Not At Risk (07/31/2023)   Humiliation, Afraid, Rape, and Kick questionnaire    Fear of Current or Ex-Partner: No    Emotionally Abused: No    Physically Abused: No    Sexually Abused: No  Depression (PHQ2-9): Low Risk (04/05/2024)   Depression (PHQ2-9)    PHQ-2 Score: 0  Alcohol Screen: Low Risk (08/01/2023)   Alcohol Screen    Last Alcohol Screening Score (AUDIT): 0  Housing: Low Risk (08/01/2023)   Housing    Last Housing Risk Score: 0  Utilities: Not At Risk (07/31/2023)   AHC Utilities    Threatened with loss of utilities: No  Health Literacy: Adequate Health  Literacy (08/10/2023)   B1300 Health Literacy    Frequency of need for help with medical instructions: Never    Review of Systems  All other systems reviewed and are negative.       Objective    BP (!) 153/98   Pulse 85   Ht 6' (1.829 m)   Wt (!) 417 lb 9.6 oz (189.4 kg)   SpO2 92%   BMI 56.64 kg/m   Physical Exam Vitals and nursing note reviewed.  Constitutional:      General: He is not in acute distress.    Appearance: He is obese.  Cardiovascular:     Rate and Rhythm: Normal rate and regular rhythm.  Pulmonary:     Effort: Pulmonary effort is normal. No respiratory distress.     Breath sounds: Normal breath sounds. No wheezing.  Abdominal:     Palpations: Abdomen is soft.     Tenderness: There is no abdominal tenderness.  Neurological:     General: No focal deficit present.     Mental Status: He is alert and oriented to person, place, and time.  Psychiatric:        Mood and Affect: Mood and affect normal.        Speech: Speech normal.        Behavior: Behavior normal. Behavior is cooperative.         Assessment & Plan:   1. Uncontrolled hypertension (Primary) Compliance discussed. Patient to keep follow up with consultant as scheduled for further eval/mgt  2. Mixed hyperlipidemia Continue   3. Anxiety and depression Appears stable. continue  4. Class 3 severe obesity due to excess calories with serious comorbidity and body mass index (BMI) of 50.0 to 59.9 in adult (HCC)   5. Obstructive sleep apnea Discussed compliance. Continue   6. Chronic combined systolic (congestive) and diastolic (  congestive) heart failure (HCC) Meds as per consultant. Refilled. Keep scheduled appt with consultant  7. Nocturia Flomax  prescribed  Return in about 4 weeks (around 11/12/2024) for follow up.   Tanda Raguel SQUIBB, MD  "

## 2024-10-22 ENCOUNTER — Ambulatory Visit (HOSPITAL_COMMUNITY)
Admission: RE | Admit: 2024-10-22 | Discharge: 2024-10-22 | Disposition: A | Discharge status: Home / Self Care | Source: Ambulatory Visit | Attending: Cardiology | Admitting: Cardiology

## 2024-10-22 ENCOUNTER — Encounter (HOSPITAL_COMMUNITY): Payer: Self-pay

## 2024-10-22 VITALS — BP 162/112 | HR 85 | Wt >= 6400 oz

## 2024-10-22 DIAGNOSIS — J45909 Unspecified asthma, uncomplicated: Secondary | ICD-10-CM | POA: Diagnosis not present

## 2024-10-22 DIAGNOSIS — G4733 Obstructive sleep apnea (adult) (pediatric): Secondary | ICD-10-CM | POA: Insufficient documentation

## 2024-10-22 DIAGNOSIS — I5042 Chronic combined systolic (congestive) and diastolic (congestive) heart failure: Secondary | ICD-10-CM

## 2024-10-22 DIAGNOSIS — I130 Hypertensive heart and chronic kidney disease with heart failure and stage 1 through stage 4 chronic kidney disease, or unspecified chronic kidney disease: Secondary | ICD-10-CM | POA: Diagnosis not present

## 2024-10-22 DIAGNOSIS — M109 Gout, unspecified: Secondary | ICD-10-CM | POA: Insufficient documentation

## 2024-10-22 DIAGNOSIS — E785 Hyperlipidemia, unspecified: Secondary | ICD-10-CM | POA: Diagnosis not present

## 2024-10-22 DIAGNOSIS — Z87891 Personal history of nicotine dependence: Secondary | ICD-10-CM | POA: Diagnosis not present

## 2024-10-22 DIAGNOSIS — E782 Mixed hyperlipidemia: Secondary | ICD-10-CM

## 2024-10-22 DIAGNOSIS — Z7984 Long term (current) use of oral hypoglycemic drugs: Secondary | ICD-10-CM | POA: Insufficient documentation

## 2024-10-22 DIAGNOSIS — Z91148 Patient's other noncompliance with medication regimen for other reason: Secondary | ICD-10-CM | POA: Insufficient documentation

## 2024-10-22 DIAGNOSIS — Z79899 Other long term (current) drug therapy: Secondary | ICD-10-CM | POA: Insufficient documentation

## 2024-10-22 DIAGNOSIS — N1831 Chronic kidney disease, stage 3a: Secondary | ICD-10-CM | POA: Insufficient documentation

## 2024-10-22 DIAGNOSIS — I5022 Chronic systolic (congestive) heart failure: Secondary | ICD-10-CM | POA: Insufficient documentation

## 2024-10-22 DIAGNOSIS — I10 Essential (primary) hypertension: Secondary | ICD-10-CM

## 2024-10-22 MED ORDER — ISOSORBIDE DINITRATE 20 MG PO TABS: 20.0000 mg | ORAL_TABLET | Freq: Three times a day (TID) | ORAL | 6 refills | Status: AC

## 2024-10-22 MED ORDER — ATORVASTATIN CALCIUM 40 MG PO TABS: 40.0000 mg | ORAL_TABLET | Freq: Every day | ORAL | 2 refills | Status: AC

## 2024-10-22 MED ORDER — CARVEDILOL 3.125 MG PO TABS: 3.1250 mg | ORAL_TABLET | Freq: Two times a day (BID) | ORAL | 6 refills | Status: DC

## 2024-10-22 MED ORDER — CARVEDILOL 25 MG PO TABS: 25.0000 mg | ORAL_TABLET | Freq: Two times a day (BID) | ORAL | Status: AC

## 2024-10-22 NOTE — Patient Instructions (Addendum)
 Good to see you today!  RESTART Isordil  20 mg Three times a day  RESTART atorvastatin  40 mg daily  Your physician has requested that you have an echocardiogram. Echocardiography is a painless test that uses sound waves to create images of your heart. It provides your doctor with information about the size and shape of your heart and how well your hearts chambers and valves are working. This procedure takes approximately one hour. There are no restrictions for this procedure. Please do NOT wear cologne, perfume, aftershave, or lotions (deodorant is allowed). Please arrive 15 minutes prior to your appointment time.  Please note: We ask at that you not bring children with you during ultrasound (echo/ vascular) testing. Due to room size and safety concerns, children are not allowed in the ultrasound rooms during exams. Our front office staff cannot provide observation of children in our lobby area while testing is being conducted. An adult accompanying a patient to their appointment will only be allowed in the ultrasound room at the discretion of the ultrasound technician under special circumstances. We apologize for any inconvenience.  You have been referred to cardiac rehab they  will call to schedule an appointment  You have been referred to pharmacy as scheduled  Labs done today, your results will be available in MyChart, we will contact you for abnormal readings.  Your physician recommends that you schedule a follow-up appointment 2 months with echocardiogram(March ) Call office when you have your schedule  If you have any questions or concerns before your next appointment please send us  a message through [place] or call our office at [phone].    TO LEAVE A MESSAGE FOR THE NURSE SELECT OPTION 2, PLEASE LEAVE A MESSAGE INCLUDING: YOUR NAME DATE OF BIRTH CALL BACK NUMBER REASON FOR CALL**this is important as we prioritize the call backs  YOU WILL RECEIVE A CALL BACK THE SAME DAY AS  LONG AS YOU CALL BEFORE 4:00 PM At the [facility], you and your health needs are our priority. As part of our continuing mission to provide you with exceptional heart care, we have created designated Provider Care Teams. These Care Teams include your primary Cardiologist (physician) and Advanced Practice Providers (APPs- Physician Assistants and Nurse Practitioners) who all work together to provide you with the care you need, when you need it.   You may see any of the following providers on your designated Care Team at your next follow up: Dr Toribio Fuel Dr Ezra Shuck Dr. Morene Brownie Greig Mosses, NP Caffie Shed, [place] [facility] [place], [place] Beckey Coe, NP Jordan Lee, NP Ellouise Class, NP Tinnie Redman, PharmD Jaun Bash, PharmD   Please be sure to bring in all your medications bottles to every appointment.    Thank you for choosing [facility]

## 2024-10-22 NOTE — Progress Notes (Signed)
 "  [facility] Note   Referring Physician: PCP: Tanda Bleacher, MD PCP-Cardiologist: None   Chief Complaint: f/u for systolic heart failure  HPI:  Leonard Lewis is a 44 y.o. male with heart failure with reduced ejection fraction, stage IIIa CKD, hypertension, hyperlipidemia, asthma, gout, obstructive sleep apnea, morbid obesity.   His history of heart failure dates back to at least August 2023 when he is admitted for chest pain and shortness of breath secondary to sepsis from community-acquired pneumonia.  TTE at that time with LVEF of 40 to 50%.  Since that time has had multiple ER admissions for otitis media and frequent gout flares.  In October 2024 he was admitted for acute on chronic heart failure and hypertensive urgency due to medication noncompliance.  Echocardiogram at that time with LVEF of 25 to 30%.  He was discharged home with a weight of 416 pounds.  Since that time he has been seen by [facility] where GDMT was further uptitrated. Repeat echo 1/25 showed improved LVEF up to 40%, RV nl.   Had last visit w/ Dr. Gardenia 6/25. At that time, he was hypotensive and Entresto  dose was reduced down to 49/51 mg bid. No HF f/u since that visit.   He presents to clinic today for f/u. Denies resting dyspnea. Reports NYHA class III symptoms, confounded by obesity/deconditioning. Wt fairly stable since last visit. He recently restarted Mounjaro . Not wearing CPAP consistently. His BP is also elevated at 162/112 but has been out Isordil  x 3 days. No longer taking hydralazine  as this caused reported HAs and dizziness. Also out of Lipitor. He is interested in starting cardiac rehab. Asking for referral    Wt Readings from Last 3 Encounters:  10/22/24 (!) 191.2 kg (421 lb 9.6 oz)  10/15/24 (!) 189.4 kg (417 lb 9.6 oz)  05/03/24 (!) 193.1 kg (425 lb 12.8 oz)      Review of Systems: [y] = yes, [ ]  = no   General: Weight gain [ ] ; Weight loss [ ] ; Anorexia [ ] ; Fatigue [  ]; Fever [ ] ; Chills [ ] ; Weakness [ ]   Cardiac: Chest pain/pressure [ ] ; Resting SOB [ ] ; Exertional SOB [ ] ; Orthopnea [ ] ; Pedal Edema [ ] ; Palpitations [ ] ; Syncope [ ] ; Presyncope [ ] ; Paroxysmal nocturnal dyspnea[ ]   Pulmonary: Cough [ ] ; Wheezing[ ] ; Hemoptysis[ ] ; Sputum [ ] ; Snoring [ ]   GI: Vomiting[ ] ; Dysphagia[ ] ; Melena[ ] ; Hematochezia [ ] ; Heartburn[ ] ; Abdominal pain [ ] ; Constipation [ ] ; Diarrhea [ ] ; BRBPR [ ]   GU: Hematuria[ ] ; Dysuria [ ] ; Nocturia[ ]   Vascular: Pain in legs with walking [ ] ; Pain in feet with lying flat [ ] ; Non-healing sores [ ] ; Stroke [ ] ; TIA [ ] ; Slurred speech [ ] ;  Neuro: Headaches[ ] ; Vertigo[ ] ; Seizures[ ] ; Paresthesias[ ] ;Blurred vision [ ] ; Diplopia [ ] ; Vision changes [ ]   Ortho/Skin: Arthritis [ ] ; Joint pain [ ] ; Muscle pain [ ] ; Joint swelling [ ] ; Back Pain [ ] ; Rash [ ]   Psych: Depression[ ] ; Anxiety[ ]   Heme: Bleeding problems [ ] ; Clotting disorders [ ] ; Anemia [ ]   Endocrine: Diabetes [ ] ; Thyroid  dysfunction[ ]    Past Medical History:  Diagnosis Date   Arthritis    Asthma    Back pain    CHF (congestive heart failure) (HCC) 2008   EF 40-50%         Dyslipidemia    Fatty liver  Hypertension    Leg edema    Obesity, morbid (HCC)    Obstructive sleep apnea    Prediabetes     Current Outpatient Medications  Medication Sig Dispense Refill   albuterol  (VENTOLIN  HFA) 108 (90 Base) MCG/ACT inhaler Inhale 2 puffs into the lungs every 6 (six) hours as needed for wheezing or shortness of breath. 8 g 0   allopurinol  (ZYLOPRIM ) 100 MG tablet TAKE 1 TABLET BY MOUTH EVERY DAY 30 tablet 2   aspirin  EC 81 MG tablet Take 1 tablet (81 mg total) by mouth daily. Swallow whole. 30 tablet 6   clonazePAM  (KLONOPIN ) 0.5 MG tablet Take 1 tablet (0.5 mg total) by mouth 2 (two) times daily as needed for anxiety. 20 tablet 1   colchicine  0.6 MG tablet Take 1.2 mg ( 2 tablets)  by mouth  as a single dose at the first sign of gout flare, followed by  0.6 mg ( 1 tablet)  BY MOUTH 1 hour later, then 0.6 mg ( 1 tablet) daily for 6 days 30 tablet 0   diclofenac  Sodium (VOLTAREN ) 1 % GEL Apply 2 g topically 4 (four) times daily. Apply to right knee. 50 g 0   empagliflozin  (JARDIANCE ) 10 MG TABS tablet Take 1 tablet (10 mg total) by mouth daily. 30 tablet 6   furosemide  (LASIX ) 40 MG tablet Take 1 tablet (40 mg total) by mouth every Monday, Wednesday, and Friday. Take extra dose (including Tuesday, Thursday, Saturday or Sunday) in case of weight gain 2 to 3 lbs in 24 hrs or 5 lbs in 7 days, until weight back to baseline. 30 tablet 6   metFORMIN  (GLUCOPHAGE ) 500 MG tablet Take 1 tablet (500 mg total) by mouth daily with breakfast. 90 tablet 0   potassium chloride  SA (KLOR-CON  M) 20 MEQ tablet Take 1 tablet (20 mEq total) by mouth every Monday, Wednesday, and Friday. Take one tablet when taking extra tablet of furosemide . 15 tablet 6   potassium chloride  SA (KLOR-CON  M) 20 MEQ tablet Take 2 tablets (40 mEq total) by mouth daily. On the days you take furosemide . 30 tablet 0   Respiratory Therapy Supplies (CARETOUCH CPAP MASK WIPES) MISC For cpap machine 1 each 3   sacubitril -valsartan  (ENTRESTO ) 49-51 MG Take 1 tablet by mouth 2 (two) times daily. 60 tablet 3   sertraline  (ZOLOFT ) 100 MG tablet TAKE 1 TABLET BY MOUTH EVERY DAY 90 tablet 0   tamsulosin  (FLOMAX ) 0.4 MG CAPS capsule Take 1 capsule (0.4 mg total) by mouth daily. 30 capsule 1   tirzepatide  (MOUNJARO ) 5 MG/0.5ML Pen Inject 5 mg into the skin once a week. 2 mL 0   atorvastatin  (LIPITOR) 40 MG tablet Take 1 tablet (40 mg total) by mouth daily. 90 tablet 2   carvedilol  (COREG ) 25 MG tablet Take 1 tablet (25 mg total) by mouth 2 (two) times daily.     furosemide  (LASIX ) 40 MG tablet Take 1 tablet (40 mg total) by mouth daily as needed for edema. 30 tablet 0   hydrALAZINE  (APRESOLINE ) 100 MG tablet Take 1 tablet (100 mg total) by mouth 3 (three) times daily. (Patient not taking: Reported on  10/22/2024) 90 tablet 6   isosorbide  dinitrate (ISORDIL ) 20 MG tablet Take 1 tablet (20 mg total) by mouth 3 (three) times daily. 90 tablet 6   tirzepatide  (MOUNJARO ) 2.5 MG/0.5ML Pen Inject 2.5 mg into the skin once a week. (Patient not taking: Reported on 10/22/2024) 2 mL 0   No current facility-administered  medications for this encounter.    Allergies[1]    Social History   Socioeconomic History   Marital status: Married    Spouse name: Rickeda   Number of children: 2   Years of education: Not on file   Highest education level: Not on file  Occupational History   Occupation: Art Gallery Manager   Occupation: [employer]  Tobacco Use   Smoking status: Former    Current packs/day: 0.00    Types: Cigarettes    Quit date: 10/04/2006    Years since quitting: 18.0   Smokeless tobacco: Never  Vaping Use   Vaping status: Never Used  Substance and Sexual Activity   Alcohol use: No   Drug use: No   Sexual activity: Not on file  Other Topics Concern   Not on file  Social History Narrative   Employed:  Services [employer] machines.    Social Drivers of Health   Tobacco Use: Medium Risk (10/15/2024)   Patient History    Smoking Tobacco Use: Former    Smokeless Tobacco Use: Never    Passive Exposure: Not on file  Financial Resource Strain: Medium Risk (08/01/2023)   Overall Financial Resource Strain (CARDIA)    Difficulty of Paying Living Expenses: Somewhat hard  Food Insecurity: No Food Insecurity (07/31/2023)   Hunger Vital Sign    Worried About Running Out of Food in the Last Year: Never true    Ran Out of Food in the Last Year: Never true  Transportation Needs: No Transportation Needs (07/31/2023)   PRAPARE - Administrator, Civil Service (Medical): No    Lack of Transportation (Non-Medical): No  Physical Activity: Inactive (08/10/2023)   Exercise Vital Sign    Days of Exercise per Week: 0 days    Minutes of Exercise per Session: 0 min  Stress: No Stress Concern  Present (08/10/2023)   [employer] of Occupational Health - Occupational Stress Questionnaire    Feeling of Stress : Only a little  Social Connections: Unknown (08/10/2023)   Social Connection and Isolation Panel    Frequency of Communication with Friends and Family: Twice a week    Frequency of Social Gatherings with Friends and Family: Not on file    Attends Religious Services: 1 to 4 times per year    Active Member of Clubs or Organizations: No    Attends Banker Meetings: Never    Marital Status: Married  Catering Manager Violence: Not At Risk (07/31/2023)   Humiliation, Afraid, Rape, and Kick questionnaire    Fear of Current or Ex-Partner: No    Emotionally Abused: No    Physically Abused: No    Sexually Abused: No  Depression (PHQ2-9): Low Risk (04/05/2024)   Depression (PHQ2-9)    PHQ-2 Score: 0  Alcohol Screen: Low Risk (08/01/2023)   Alcohol Screen    Last Alcohol Screening Score (AUDIT): 0  Housing: Low Risk (08/01/2023)   Housing    Last Housing Risk Score: 0  Utilities: Not At Risk (07/31/2023)   AHC Utilities    Threatened with loss of utilities: No  Health Literacy: Adequate Health Literacy (08/10/2023)   B1300 Health Literacy    Frequency of need for help with medical instructions: Never      Family History  Problem Relation Age of Onset   Hypertension Mother    Obesity Mother    Hypertension Father     Vitals:   10/22/24 1521  BP: (!) 162/112  Pulse: 85  SpO2:  96%  Weight: (!) 191.2 kg (421 lb 9.6 oz)     PHYSICAL EXAM: General:  super morbid obesity. No respiratory difficulty HEENT: normal Neck: JVD not elevated.  Cor: PMI nondisplaced. Regular rate & rhythm. No rubs, gallops or murmurs. Lungs: clear Abdomen: soft, nontender, nondistended. No hepatosplenomegaly. No bruits or masses. Good bowel sounds. Extremities: no cyanosis, clubbing, rash, edema Neuro: alert & oriented x 3, cranial nerves grossly intact. moves all 4  extremities w/o difficulty. Affect pleasant.  ECG: not performed    ASSESSMENT & PLAN:  1. Chronic Systolic Heart Failure - suspect nonischemic cardiomyopathy from uncontrolled hypertension and prior h/o medication noncompliance  - last echo 1/25 showed interval improvement w/ GDMT, up from 25%>>40% - NYHA class IIb-III, confounded by obesity/deconditioning. Wt stable and appears grossly euvolemic on exam  - continue Lasix  40 mg MWF - continue Jardiance  10 mg daily  - continue Entresto  49-51 mg bid (previously failed max dose d/t hypotension/orthostasis) - restart Isordil  at 20 mg tid - off hydralazine  due to HA and orthostasis - continue Coreg  25 mg bid - RTC for pharmD visit in 2-3 wks. If BP still elevated, can retrial Entresto  97-103 dose - repeat echo. If EF still improving, can graduate from the [facility] and reassign to gen cards - refer to cardiac rehab   2. Hypertension  - elevated - continue GDMT per above - restart Isordil  20 mg tid - needs to improve compliance w/ CPAP  3. CKD IIIa - on Jardiance   - recent labs from PCP office reviewed, SCr/K stable   4. OSA/Obesity  - admits to poor compliance w/ CPAP, difficulty tolerating face mask - just restarted Mounjaro . Hopefully will improve w/ wt loss       Caffie Shed, PA-C 10/22/24      [1] No Known Allergies  "

## 2024-10-26 ENCOUNTER — Telehealth (HOSPITAL_COMMUNITY): Payer: Self-pay

## 2024-10-26 ENCOUNTER — Encounter (HOSPITAL_COMMUNITY): Payer: Self-pay

## 2024-10-26 NOTE — Telephone Encounter (Signed)
 Pt returned phone call and is interested in the cardiac rehab program. Will reach out to Brittainy to get a MD signature for referral and will pass pt referral for review.

## 2024-10-26 NOTE — Telephone Encounter (Signed)
 Office referral received for Cardiac rehab. Attempted to call patient in regards to Cardiac Rehab - LM on VM   Sent letter

## 2024-10-26 NOTE — Telephone Encounter (Signed)
 Pt insurance is active and benefits verified through [facility] Co-pay 0, DED $1,800/$638.55 met, out of pocket $3,500/$648.55 met, co-insurance 20%. no pre-authorization required, [employer] 10/26/2024, REF# [id number]  TCR/ICR? ICR Visit(date of service)limitation? 30 visits Can multiple codes be used on the same date of service/visit?(IF ITS A LIMIT) yes  Is this a lifetime maximum or an annual maximum? annual Has the member used any of these services to date? no Is there a time limit (weeks/months) on start of program and/or program completion? No

## 2024-11-07 ENCOUNTER — Ambulatory Visit (HOSPITAL_COMMUNITY): Admission: RE | Admit: 2024-11-07 | Source: Ambulatory Visit

## 2024-11-09 ENCOUNTER — Other Ambulatory Visit (HOSPITAL_COMMUNITY): Payer: Self-pay | Admitting: *Deleted

## 2024-11-09 DIAGNOSIS — I5042 Chronic combined systolic (congestive) and diastolic (congestive) heart failure: Secondary | ICD-10-CM

## 2024-11-29 ENCOUNTER — Ambulatory Visit: Payer: Self-pay | Admitting: Family Medicine

## 2024-12-20 ENCOUNTER — Ambulatory Visit (HOSPITAL_COMMUNITY)

## 2024-12-20 ENCOUNTER — Other Ambulatory Visit (HOSPITAL_COMMUNITY)
